# Patient Record
Sex: Female | Born: 1945
Health system: Southern US, Community
[De-identification: ages and names within clinical notes are randomized; demographics above are authoritative.]

## PROBLEM LIST (undated history)

## (undated) DIAGNOSIS — E785 Hyperlipidemia, unspecified: Secondary | ICD-10-CM

## (undated) DIAGNOSIS — C549 Malignant neoplasm of corpus uteri, unspecified: Secondary | ICD-10-CM

## (undated) DIAGNOSIS — F329 Major depressive disorder, single episode, unspecified: Secondary | ICD-10-CM

## (undated) DIAGNOSIS — I10 Essential (primary) hypertension: Secondary | ICD-10-CM

## (undated) DIAGNOSIS — K5792 Diverticulitis of intestine, part unspecified, without perforation or abscess without bleeding: Secondary | ICD-10-CM

## (undated) DIAGNOSIS — D6481 Anemia due to antineoplastic chemotherapy: Secondary | ICD-10-CM

## (undated) DIAGNOSIS — C541 Malignant neoplasm of endometrium: Secondary | ICD-10-CM

## (undated) DIAGNOSIS — C801 Malignant (primary) neoplasm, unspecified: Secondary | ICD-10-CM

## (undated) DIAGNOSIS — Z973 Presence of spectacles and contact lenses: Secondary | ICD-10-CM

## (undated) DIAGNOSIS — T451X5A Adverse effect of antineoplastic and immunosuppressive drugs, initial encounter: Secondary | ICD-10-CM

## (undated) DIAGNOSIS — K760 Fatty (change of) liver, not elsewhere classified: Secondary | ICD-10-CM

## (undated) HISTORY — DX: Essential (primary) hypertension: I10

## (undated) HISTORY — DX: Diverticulitis of intestine, part unspecified, without perforation or abscess without bleeding: K57.92

## (undated) HISTORY — DX: Adverse effect of antineoplastic and immunosuppressive drugs, initial encounter: T45.1X5A

## (undated) HISTORY — DX: Malignant neoplasm of corpus uteri, unspecified: C54.9

## (undated) HISTORY — DX: Hyperlipidemia, unspecified: E78.5

## (undated) HISTORY — DX: Presence of spectacles and contact lenses: Z97.3

## (undated) HISTORY — DX: Malignant neoplasm of endometrium: C54.1

## (undated) HISTORY — DX: Anemia due to antineoplastic chemotherapy: D64.81

## (undated) HISTORY — DX: Malignant (primary) neoplasm, unspecified: C80.1

## (undated) HISTORY — DX: Fatty (change of) liver, not elsewhere classified: K76.0

## (undated) HISTORY — DX: Major depressive disorder, single episode, unspecified: F32.9

---

## 2007-04-13 ENCOUNTER — Other Ambulatory Visit: Admission: RE | Admit: 2007-04-13 | Discharge: 2007-04-13 | Payer: Self-pay | Admitting: Family Medicine

## 2007-04-13 ENCOUNTER — Ambulatory Visit: Payer: Self-pay | Admitting: Family Medicine

## 2007-04-13 ENCOUNTER — Encounter: Payer: Self-pay | Admitting: Family Medicine

## 2007-04-13 ENCOUNTER — Encounter: Admission: RE | Admit: 2007-04-13 | Discharge: 2007-04-13 | Payer: Self-pay | Admitting: Family Medicine

## 2007-04-13 DIAGNOSIS — N951 Menopausal and female climacteric states: Secondary | ICD-10-CM | POA: Insufficient documentation

## 2007-04-14 ENCOUNTER — Encounter: Payer: Self-pay | Admitting: Family Medicine

## 2007-04-14 DIAGNOSIS — E785 Hyperlipidemia, unspecified: Secondary | ICD-10-CM | POA: Insufficient documentation

## 2007-04-14 LAB — CONVERTED CEMR LAB
ALT: 27 units/L (ref 0–35)
AST: 24 units/L (ref 0–37)
Albumin: 4.3 g/dL (ref 3.5–5.2)
Chloride: 103 meq/L (ref 96–112)
Cholesterol, target level: 200 mg/dL
Cholesterol: 208 mg/dL — ABNORMAL HIGH (ref 0–200)
Creatinine, Ser: 0.81 mg/dL (ref 0.40–1.20)
HDL: 53 mg/dL (ref >39)
LDL Goal: 160 mg/dL
Potassium: 4.9 meq/L (ref 3.5–5.3)
Sodium: 141 meq/L (ref 135–145)
Total Protein: 7.3 g/dL (ref 6.0–8.3)
Triglycerides: 157 mg/dL — ABNORMAL HIGH (ref <150)

## 2007-04-18 LAB — CONVERTED CEMR LAB: Pap Smear: NORMAL

## 2007-04-25 ENCOUNTER — Encounter: Payer: Self-pay | Admitting: Family Medicine

## 2007-06-08 ENCOUNTER — Ambulatory Visit: Payer: Self-pay | Admitting: Family Medicine

## 2007-07-24 ENCOUNTER — Ambulatory Visit: Payer: Self-pay | Admitting: Family Medicine

## 2007-07-24 DIAGNOSIS — I10 Essential (primary) hypertension: Secondary | ICD-10-CM | POA: Insufficient documentation

## 2007-11-17 ENCOUNTER — Ambulatory Visit: Payer: Self-pay | Admitting: Family Medicine

## 2007-11-17 LAB — CONVERTED CEMR LAB
Albumin/Creatinine Ratio, Urine, POC: <30
Microalbumin U total vol: 10 mg/L

## 2007-12-04 ENCOUNTER — Encounter: Payer: Self-pay | Admitting: Family Medicine

## 2007-12-05 LAB — CONVERTED CEMR LAB
BUN: 15 mg/dL (ref 6–23)
Chloride: 104 meq/L (ref 96–112)
Cholesterol: 242 mg/dL — ABNORMAL HIGH (ref 0–200)
Creatinine, Ser: 0.95 mg/dL (ref 0.40–1.20)
Glucose, Bld: 96 mg/dL (ref 70–99)
HDL: 48 mg/dL (ref >39)
LDL Goal: 130 mg/dL
Potassium: 4.2 meq/L (ref 3.5–5.3)
Triglycerides: 203 mg/dL — ABNORMAL HIGH (ref <150)
VLDL: 41 mg/dL — ABNORMAL HIGH (ref 0–40)

## 2007-12-12 ENCOUNTER — Ambulatory Visit: Payer: Self-pay | Admitting: Family Medicine

## 2007-12-12 DIAGNOSIS — M47816 Spondylosis without myelopathy or radiculopathy, lumbar region: Secondary | ICD-10-CM

## 2007-12-12 LAB — CONVERTED CEMR LAB
Blood in Urine, dipstick: NEGATIVE
Nitrite: NEGATIVE
Urobilinogen, UA: 0.2
pH: 5.5

## 2008-01-15 ENCOUNTER — Ambulatory Visit: Payer: Self-pay | Admitting: Family Medicine

## 2008-01-15 LAB — CONVERTED CEMR LAB
Glucose, Urine, Semiquant: NEGATIVE
Protein, U semiquant: NEGATIVE
Specific Gravity, Urine: 1.015
Urobilinogen, UA: 0.2

## 2008-02-23 DIAGNOSIS — K5792 Diverticulitis of intestine, part unspecified, without perforation or abscess without bleeding: Secondary | ICD-10-CM

## 2008-02-23 HISTORY — DX: Diverticulitis of intestine, part unspecified, without perforation or abscess without bleeding: K57.92

## 2008-06-03 ENCOUNTER — Encounter: Admission: RE | Admit: 2008-06-03 | Discharge: 2008-06-03 | Payer: Self-pay | Admitting: Family Medicine

## 2008-07-03 ENCOUNTER — Ambulatory Visit: Payer: Self-pay | Admitting: Family Medicine

## 2008-07-03 ENCOUNTER — Other Ambulatory Visit: Admission: RE | Admit: 2008-07-03 | Discharge: 2008-07-03 | Payer: Self-pay | Admitting: Family Medicine

## 2008-07-03 ENCOUNTER — Encounter: Payer: Self-pay | Admitting: Family Medicine

## 2008-07-05 LAB — CONVERTED CEMR LAB
AST: 18 units/L (ref 0–37)
Albumin: 4.3 g/dL (ref 3.5–5.2)
Alkaline Phosphatase: 56 units/L (ref 39–117)
BUN: 16 mg/dL (ref 6–23)
Chloride: 102 meq/L (ref 96–112)
Pap Smear: NORMAL
Potassium: 4.4 meq/L (ref 3.5–5.3)
Total Bilirubin: 0.6 mg/dL (ref 0.3–1.2)
Total Protein: 7.2 g/dL (ref 6.0–8.3)

## 2008-11-11 ENCOUNTER — Ambulatory Visit: Payer: Self-pay | Admitting: Family Medicine

## 2008-11-11 LAB — CONVERTED CEMR LAB
Glucose, Urine, Semiquant: NEGATIVE
Nitrite: NEGATIVE
Protein, U semiquant: NEGATIVE
Urobilinogen, UA: 0.2
pH: 7

## 2008-11-12 ENCOUNTER — Encounter: Payer: Self-pay | Admitting: Family Medicine

## 2008-11-13 LAB — CONVERTED CEMR LAB
ALT: 18 units/L (ref 0–35)
Albumin: 4.2 g/dL (ref 3.5–5.2)
Alkaline Phosphatase: 59 units/L (ref 39–117)
Basophils Relative: 0 % (ref 0–1)
CO2: 24 meq/L (ref 19–32)
Chloride: 99 meq/L (ref 96–112)
Lymphocytes Relative: 17 % (ref 12–46)
MCHC: 30.5 g/dL (ref 30.0–36.0)
Monocytes Absolute: 1.8 10*3/uL — ABNORMAL HIGH (ref 0.1–1.0)
Monocytes Relative: 9 % (ref 3–12)
Neutro Abs: 14 10*3/uL — ABNORMAL HIGH (ref 1.7–7.7)
Potassium: 4.5 meq/L (ref 3.5–5.3)
WBC: 19 10*3/uL — ABNORMAL HIGH (ref 4.0–10.5)

## 2008-11-14 ENCOUNTER — Encounter: Admission: RE | Admit: 2008-11-14 | Discharge: 2008-11-14 | Payer: Self-pay | Admitting: Family Medicine

## 2008-11-14 ENCOUNTER — Encounter: Payer: Self-pay | Admitting: Family Medicine

## 2008-11-25 ENCOUNTER — Ambulatory Visit: Payer: Self-pay | Admitting: Family Medicine

## 2008-11-25 DIAGNOSIS — K5732 Diverticulitis of large intestine without perforation or abscess without bleeding: Secondary | ICD-10-CM

## 2008-11-26 LAB — CONVERTED CEMR LAB
Basophils Relative: 0 % (ref 0–1)
Eosinophils Relative: 2 % (ref 0–5)
HCT: 40.9 % (ref 36.0–46.0)
Lymphs Abs: 4.5 10*3/uL — ABNORMAL HIGH (ref 0.7–4.0)
MCV: 89.5 fL (ref 78.0–100.0)
Monocytes Relative: 7 % (ref 3–12)
Platelets: 310 10*3/uL (ref 150–400)
RDW: 13.2 % (ref 11.5–15.5)
WBC: 9.7 10*3/uL (ref 4.0–10.5)

## 2009-03-19 ENCOUNTER — Ambulatory Visit: Payer: Self-pay | Admitting: Family Medicine

## 2009-04-15 ENCOUNTER — Ambulatory Visit: Payer: Self-pay | Admitting: Family Medicine

## 2009-06-02 ENCOUNTER — Encounter: Admission: RE | Admit: 2009-06-02 | Discharge: 2009-06-02 | Payer: Self-pay | Admitting: Family Medicine

## 2009-06-02 ENCOUNTER — Ambulatory Visit: Payer: Self-pay | Admitting: Family Medicine

## 2009-06-03 ENCOUNTER — Encounter: Admission: RE | Admit: 2009-06-03 | Discharge: 2009-06-03 | Payer: Self-pay | Admitting: Family Medicine

## 2009-06-04 DIAGNOSIS — M899 Disorder of bone, unspecified: Secondary | ICD-10-CM | POA: Insufficient documentation

## 2009-06-04 DIAGNOSIS — M949 Disorder of cartilage, unspecified: Secondary | ICD-10-CM

## 2009-06-04 LAB — CONVERTED CEMR LAB
ALT: 23 units/L (ref 0–35)
AST: 21 units/L (ref 0–37)
Albumin: 4.3 g/dL (ref 3.5–5.2)
Alkaline Phosphatase: 54 units/L (ref 39–117)
CO2: 28 meq/L (ref 19–32)
Chloride: 102 meq/L (ref 96–112)
Cholesterol: 225 mg/dL — ABNORMAL HIGH (ref 0–200)
HDL: 52 mg/dL (ref >39)
LDL Cholesterol: 134 mg/dL — ABNORMAL HIGH (ref 0–99)
Potassium: 4.4 meq/L (ref 3.5–5.3)
Sodium: 139 meq/L (ref 135–145)

## 2009-06-05 ENCOUNTER — Encounter: Payer: Self-pay | Admitting: Family Medicine

## 2009-06-05 LAB — CONVERTED CEMR LAB: Vit D, 25-Hydroxy: 35 ng/mL (ref 30–89)

## 2009-06-16 ENCOUNTER — Telehealth: Payer: Self-pay | Admitting: Family Medicine

## 2009-07-02 ENCOUNTER — Encounter: Payer: Self-pay | Admitting: Family Medicine

## 2009-07-09 ENCOUNTER — Encounter: Payer: Self-pay | Admitting: Family Medicine

## 2009-12-08 ENCOUNTER — Ambulatory Visit: Payer: Self-pay | Admitting: Family Medicine

## 2009-12-08 DIAGNOSIS — J069 Acute upper respiratory infection, unspecified: Secondary | ICD-10-CM | POA: Insufficient documentation

## 2010-01-19 ENCOUNTER — Encounter: Payer: Self-pay | Admitting: Family Medicine

## 2010-01-22 ENCOUNTER — Telehealth (INDEPENDENT_AMBULATORY_CARE_PROVIDER_SITE_OTHER): Payer: Self-pay | Admitting: *Deleted

## 2010-01-26 ENCOUNTER — Telehealth: Payer: Self-pay | Admitting: Family Medicine

## 2010-01-26 DIAGNOSIS — H0520 Unspecified exophthalmos: Secondary | ICD-10-CM | POA: Insufficient documentation

## 2010-02-05 ENCOUNTER — Encounter: Payer: Self-pay | Admitting: Family Medicine

## 2010-02-18 ENCOUNTER — Encounter: Payer: Self-pay | Admitting: Family Medicine

## 2010-03-24 NOTE — Progress Notes (Signed)
Summary: thyroid,dx code     New Problems: PROPTOSIS (ICD-376.30)   New Problems: PROPTOSIS (ICD-376.30) 2:02 PM January 26, 2010 McCrimmon CMA, Duncan Dull), Sue Lush  added a TSH for pt per eye doc. I need a dx code.  Try proptosis (376.30). December  5, 20112:22 PM [place] MD, Santina Evans  2:45 PM January 26, 2010 McCrimmon CMA, Duncan Dull), Sue Lush faxed order to lab

## 2010-03-24 NOTE — Assessment & Plan Note (Signed)
Summary: HTN, cough on ACEi   Vital Signs:  Patient profile:   65 year old female Height:      67 inches Weight:      209 pounds Pulse rate:   68 / minute BP sitting:   143 / 74  (left arm) Cuff size:   large  Vitals Entered By: Kathlene November (March 19, 2009 3:18 PM) CC: followup BP- pt states was feeling really tired and stopped her BP med for 2 days and feeling better but now BP elevated   Primary Care Provider:  Linford Arnold, C  CC:  followup BP- pt states was feeling really tired and stopped her BP med for 2 days and feeling better but now BP elevated.  History of Present Illness: followup BP- pt states was feeling really tired and stopped her BP med for 2 days and feeling better but now BP elevated. BP was 121/60s.  Felt very fatigued for several months.  Had a dry cough as well. Stoped med for a couple of days and did feel better. Took her old HCTZ in the meantime. Says has been walking more and eating better. Has cut out some of the fast food.   Current Medications (verified): 1)  Calcium 500/d 500-200 Mg-Unit  Tabs (Calcium Carbonate-Vitamin D) .... Take 1 Tablet By Mouth Once A Day 2)  Lisinopril-Hydrochlorothiazide 20-25 Mg Tabs (Lisinopril-Hydrochlorothiazide) .... Take 1 Tablet By Mouth Once A Day 3)  Welchol 625 Mg Tabs (Colesevelam Hcl) .... 3 Tabs By Mouth Two Times A Day  Allergies (verified): 1)  Zocor 2)  Pravastatin Sodium (Pravastatin Sodium) 3)  Lipitor (Atorvastatin Calcium) 4)  Lisinopril (Lisinopril)  Comments:  Nurse/Medical Assistant: The patient's medications and allergies were reviewed with the patient and were updated in the Medication and Allergy Lists. Kathlene November (March 19, 2009 3:19 PM)  Physical Exam  General:  Well-developed,well-nourished,in no acute distress; alert,appropriate and cooperative throughout examination Head:  Normocephalic and atraumatic without obvious abnormalities. No apparent alopecia or balding. Lungs:  Normal  respiratory effort, chest expands symmetrically. Lungs are clear to auscultation, no crackles or wheezes. Heart:  Normal rate and regular rhythm. S1 and S2 normal without gallop, murmur, click, rub or other extra sounds. Skin:  no rashes.   Psych:  Cognition and judgment appear intact. Alert and cooperative with normal attention span and concentration. No apparent delusions, illusions, hallucinations   Impression & Recommendations:  Problem # 1:  HYPERTENSION, BENIGN (ICD-401.1) Stop ACEi secondary to cough.  Change to ARB with diuretic. CHeck BP at home and make sure well controlled.  Fu in April or May for her CPE. Will give her her shingles vaccines today. Due for labs with her CPE.  Her updated medication list for this problem includes:    Losartan Potassium-hctz 50-12.5 Mg Tabs (Losartan potassium-hctz) .Marland Kitchen... Take 1 tablet by mouth once a day in the am.  Complete Medication List: 1)  Calcium 500/d 500-200 Mg-unit Tabs (Calcium carbonate-vitamin d) .... Take 1 tablet by mouth once a day 2)  Losartan Potassium-hctz 50-12.5 Mg Tabs (Losartan potassium-hctz) .... Take 1 tablet by mouth once a day in the am. 3)  Welchol 625 Mg Tabs (Colesevelam hcl) .... 3 tabs by mouth two times a day  Other Orders: Zoster (Shingles) Vaccine Live [phone]) Admin 1st Vaccine (69629)  Patient Instructions: 1)  Please schedule a follow-up appointment in 3 months .  Prescriptions: LOSARTAN POTASSIUM-HCTZ 50-12.5 MG TABS (LOSARTAN POTASSIUM-HCTZ) Take 1 tablet by mouth once a day in the AM.  #  30 x 3   Entered and Authorized by:   Nani Gasser MD   Signed by:   Nani Gasser MD on 03/19/2009   Method used:   Electronically to        [facility]* (retail)       [address] [place], [place]  [address]       Ph: [phone]       Fax: [phone]   RxID:   [id number]   Last Flu Vaccine:  Historical (11/30/2007 3:50:21 PM) Flu Vaccine Result Date:  11/22/2008 Flu Vaccine  Result:  given Flu Vaccine Next Due:  1 yr   Immunizations Administered:  Zostavax # 1:    Vaccine Type: Zostavax    Site: left deltoid    Mfr: Merck    Dose: 0.44ml    Route: IM    Given by: Kathlene November    Exp. Date: 03/21/2010    Lot #: [id number]    VIS given: 12/04/04 given March 19, 2009.

## 2010-03-24 NOTE — Letter (Signed)
Summary: Letter to Patient with Path Results/[facility]  Letter to Patient with Path Results/[facility]   Imported By: Lanelle Bal 08/21/2009 09:05:41  _____________________________________________________________________  External Attachment:    Type:   Image     Comment:   External Document

## 2010-03-24 NOTE — Assessment & Plan Note (Signed)
Summary: URI   Vital Signs:  Patient profile:   65 year old female Height:      67 inches Weight:      210 pounds BMI:     33.01 Temp:     98.6 degrees F oral Pulse rate:   68 / minute BP sitting:   118 / 58  (left arm) Cuff size:   large  Vitals Entered By: Kathlene November (April 15, 2009 2:58 PM) CC: last wednesday- cough, head congestion, sore throat- but feels some better today   Primary Care Provider:  Linford Arnold, C  CC:  last wednesday- cough, head congestion, and sore throat- but feels some better today.  History of Present Illness: last wednesday- cough, head congestion, sore throat- but feels some better today. No muscle aches.  Low grade fever the first day.  Then started with cough, produtive cough.  AFter 2 days had a head congestion and some ear pain. That got better. Yesterday had slight ST but better today. Taking some histablock for the sinuses and AOJ for respiratory sxs.    No GI sxs.  No worsening sxs.   Current Medications (verified): 1)  Calcium 500/d 500-200 Mg-Unit  Tabs (Calcium Carbonate-Vitamin D) .... Take 1 Tablet By Mouth Once A Day 2)  Losartan Potassium-Hctz 50-12.5 Mg Tabs (Losartan Potassium-Hctz) .... Take 1 Tablet By Mouth Once A Day in The Am. 3)  Welchol 625 Mg Tabs (Colesevelam Hcl) .... 3 Tabs By Mouth Two Times A Day  Allergies (verified): 1)  Zocor 2)  Pravastatin Sodium (Pravastatin Sodium) 3)  Lipitor (Atorvastatin Calcium) 4)  Lisinopril (Lisinopril)  Comments:  Nurse/Medical Assistant: The patient's medications and allergies were reviewed with the patient and were updated in the Medication and Allergy Lists. Kathlene November (April 15, 2009 2:59 PM)  Physical Exam  General:  Well-developed,well-nourished,in no acute distress; alert,appropriate and cooperative throughout examination Head:  Normocephalic and atraumatic without obvious abnormalities. No apparent alopecia or balding. Eyes:  No corneal or conjunctival inflammation  noted. EOMI. Perrla.  Ears:  Right canal is blocked by cerume.  Left canal with a fair amount of wax and manual disimpaction performed.  Nose:  External nasal examination shows no deformity or inflammation. Nasal mucosa are pink and moist without lesions or exudates. Mouth:  Oral mucosa and oropharynx without lesions or exudates.  Teeth in good repair. Neck:  No deformities, masses, or tenderness noted. Lungs:  Normal respiratory effort, chest expands symmetrically. Lungs are clear to auscultation, no crackles or wheezes. Heart:  Normal rate and regular rhythm. S1 and S2 normal without gallop, murmur, click, rub or other extra sounds. Skin:  no rashes.   Cervical Nodes:  No lymphadenopathy noted Psych:  Cognition and judgment appear intact. Alert and cooperative with normal attention span and concentration. No apparent delusions, illusions, hallucinations   Impression & Recommendations:  Problem # 1:  URI (ICD-465.9)  Instructed on symptomatic treatment. Call if symptoms persist or worsen. She is on the mend. Call if suddenlty gets worse.   Problem # 2:  CERUMEN IMPACTION (ICD-380.4)  Right ear irrigated.  manually disimpacted the left ear.   Orders: Cerumen Impaction Removal ([id number])  Complete Medication List: 1)  Calcium 500/d 500-200 Mg-unit Tabs (Calcium carbonate-vitamin d) .... Take 1 tablet by mouth once a day 2)  Losartan Potassium-hctz 50-12.5 Mg Tabs (Losartan potassium-hctz) .... Take 1 tablet by mouth once a day in the am. 3)  Welchol 625 Mg Tabs (Colesevelam hcl) .... 3 tabs by mouth two  times a day  Appended Document: URI Manually disimpacted ear with a curette.

## 2010-03-24 NOTE — Progress Notes (Signed)
----   Converted from flag ---- ---- 01/20/2010 8:14 AM, Nani Gasser MD wrote: Call pt: Her eye MD asked that we check her thyroid because of asymmetry of her eyes. If OK we can print order adn she can go anytime. ------------------------------  01/22/10 1:51 called and left message on pt vm andrea mccrimmon cma

## 2010-03-24 NOTE — Procedures (Signed)
Summary: Colonoscopy    Last Colonoscopy:  Unknown (02/23/2003 3:51:21 PM) Colonoscopy Result Date:  07/02/2009 Colonoscopy Result:  normal

## 2010-03-24 NOTE — Procedures (Signed)
Summary: Colonoscopy/[facility]  Colonoscopy/[facility]   Imported By: Lanelle Bal 07/10/2009 09:15:41  _____________________________________________________________________  External Attachment:    Type:   Image     Comment:   External Document

## 2010-03-24 NOTE — Assessment & Plan Note (Signed)
Summary: URI   Vital Signs:  Patient profile:   65 year old female Height:      67 inches Weight:      218 pounds BP sitting:   128 / 72  (left arm) Cuff size:   large  Vitals Entered By: Avon Gully CMA, Duncan Dull) (December 08, 2009 2:51 PM) CC: scrathy throat since sat,cough,   Primary Care Arita Severtson:  Linford Arnold, C  CC:  scrathy throat since sat, cough, and .  History of Present Illness: scrathy throat since sat. then today started with cough.  Not sure if any fever.  No SOB.  No cough meds.  No other nasal sxs or allergies.   Current Medications (verified): 1)  Calcium 500/d 500-200 Mg-Unit  Tabs (Calcium Carbonate-Vitamin D) .... Take 1 Tablet By Mouth Two Times A Day 2)  Losartan Potassium-Hctz 50-12.5 Mg Tabs (Losartan Potassium-Hctz) .... Take 1 Tablet By Mouth Once A Day in The Am. 3)  Welchol 3.75 Gm Pack (Colesevelam Hcl) .Marland Kitchen.. 1 Pack Mixed With Glass of Water Once A Day For Cholesterol.  Allergies (verified): 1)  Zocor 2)  Pravastatin Sodium (Pravastatin Sodium) 3)  Lipitor (Atorvastatin Calcium) 4)  Lisinopril (Lisinopril)  Comments:  Nurse/Medical Assistant: The patient's medications and allergies were reviewed with the patient and were updated in the Medication and Allergy Lists. Avon Gully CMA, Duncan Dull) (December 08, 2009 2:52 PM)  Physical Exam  General:  Well-developed,well-nourished,in no acute distress; alert,appropriate and cooperative throughout examination Head:  Normocephalic and atraumatic without obvious abnormalities. No apparent alopecia or balding. Eyes:  No corneal or conjunctival inflammation noted. EOMI. Perrla. Ears:  External ear exam shows no significant lesions or deformities.  Otoscopic examination reveals clear canals, tympanic membranes are intact bilaterally without bulging, retraction, inflammation or discharge. Hearing is grossly normal bilaterally. Nose:  External nasal examination shows no deformity or inflammation. Nasal mucosa  are pink and moist without lesions or exudates. Tubinates are midly swollen.  Mouth:  Oral mucosa and oropharynx without lesions or exudates.  Teeth in good repair. Neck:  No deformities, masses, or tenderness noted. Lungs:  Normal respiratory effort, chest expands symmetrically. Lungs are clear to auscultation, no crackles or wheezes. Heart:  Normal rate and regular rhythm. S1 and S2 normal without gallop, murmur, click, rub or other extra sounds. Skin:  no rashes.   Cervical Nodes:  No lymphadenopathy noted Psych:  Cognition and judgment appear intact. Alert and cooperative with normal attention span and concentration. No apparent delusions, illusions, hallucinations   Impression & Recommendations:  Problem # 1:  URI (ICD-465.9)  Instructed on symptomatic treatment. Call if symptoms persist or worsen.   Complete Medication List: 1)  Calcium 500/d 500-200 Mg-unit Tabs (Calcium carbonate-vitamin d) .... Take 1 tablet by mouth two times a day 2)  Losartan Potassium-hctz 50-12.5 Mg Tabs (Losartan potassium-hctz) .... Take 1 tablet by mouth once a day in the am. 3)  Welchol 3.75 Gm Pack (Colesevelam hcl) .Marland Kitchen.. 1 pack mixed with glass of water once a day for cholesterol.   Orders Added: 1)  Est. Patient Level III [54098]

## 2010-03-24 NOTE — Letter (Signed)
Summary: Eye Exam/[facility]  Eye Exam/[facility]   Imported By: Maryln Gottron 01/29/2010 15:16:31  _____________________________________________________________________  External Attachment:    Type:   Image     Comment:   External Document

## 2010-03-24 NOTE — Assessment & Plan Note (Signed)
Summary: CPE   Vital Signs:  Patient profile:   65 year old female Height:      67 inches Weight:      209 pounds Pulse rate:   67 / minute BP sitting:   111 / 58  (left arm) Cuff size:   large  Vitals Entered By: Kathlene November (June 02, 2009 8:04 AM) CC: CPE no pap   Primary Care Provider:  Linford Arnold, C  CC:  CPE no pap.  History of Present Illness: CPE no pap.  Getting resgular exercise right now.  Thinks may retire soon.   Current Medications (verified): 1)  Calcium 500/d 500-200 Mg-Unit  Tabs (Calcium Carbonate-Vitamin D) .... Take 1 Tablet By Mouth Once A Day 2)  Losartan Potassium-Hctz 50-12.5 Mg Tabs (Losartan Potassium-Hctz) .... Take 1 Tablet By Mouth Once A Day in The Am.  Allergies (verified): 1)  Zocor 2)  Pravastatin Sodium (Pravastatin Sodium) 3)  Lipitor (Atorvastatin Calcium) 4)  Lisinopril (Lisinopril)  Comments:  Nurse/Medical Assistant: The patient's medications and allergies were reviewed with the patient and were updated in the Medication and Allergy Lists. Kathlene November (June 02, 2009 8:05 AM)  Past History:  Past Surgical History: Last updated: 04/13/2007 [facility]  Family History: Last updated: 04/13/2007 Father with Lung Ca- smoker Mother with RA and severe Osteoporosis.   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  Married.   Never Smoked Alcohol use-no Drug use-no Regular exercise-yes  Review of Systems  The patient denies anorexia, fever, weight loss, weight gain, vision loss, decreased hearing, hoarseness, chest pain, syncope, dyspnea on exertion, peripheral edema, prolonged cough, headaches, hemoptysis, abdominal pain, melena, hematochezia, severe indigestion/heartburn, hematuria, incontinence, genital sores, muscle weakness, suspicious skin lesions, transient blindness, difficulty walking, depression, unusual weight change, abnormal bleeding, enlarged lymph nodes, and breast masses.     Physical Exam  General:  Well-developed,well-nourished,in no acute distress; alert,appropriate and cooperative throughout examination Head:  Normocephalic and atraumatic without obvious abnormalities. No apparent alopecia or balding. Eyes:  No corneal or conjunctival inflammation noted. EOMI. Perrla.  Ears:  External ear exam shows no significant lesions or deformities.  Otoscopic examination reveals clear canals, tympanic membranes are intact bilaterally without bulging, retraction, inflammation or discharge. Hearing is grossly normal bilaterally. Nose:  External nasal examination shows no deformity or inflammation. Nasal mucosa are pink and moist without lesions or exudates. Mouth:  Oral mucosa and oropharynx without lesions or exudates.  Teeth in good repair. Neck:  No deformities, masses, or tenderness noted. Chest Wall:  No deformities, masses, or tenderness noted. Breasts:  No mass, nodules, thickening, tenderness, bulging, retraction, inflamation, nipple discharge or skin changes noted.   Lungs:  Normal respiratory effort, chest expands symmetrically. Lungs are clear to auscultation, no crackles or wheezes. Heart:  Normal rate and regular rhythm. S1 and S2 normal without gallop, murmur, click, rub or other extra sounds. Abdomen:  Bowel sounds positive,abdomen soft and non-tender without masses, organomegaly or hernias noted. Msk:  No deformity or scoliosis noted of thoracic or lumbar spine.   Pulses:  R and L carotid,radial,dorsalis pedis and posterior tibial pulses are full and equal bilaterally Extremities:  No clubbing, cyanosis, edema, or deformity noted with normal full range of motion of all joints.   Neurologic:  No cranial nerve deficits noted. Station and gait are normal. DTRs are symmetrical throughout. Sensory, motor and coordinative functions appear intact. Skin:  no rashes.   Cervical Nodes:  No lymphadenopathy noted Axillary Nodes:  No  palpable lymphadenopathy Psych:   Cognition and judgment appear intact. Alert and cooperative with normal attention span and concentration. No apparent delusions, illusions, hallucinations   Impression & Recommendations:  Problem # 1:  HEALTH MAINTENANCE EXAM (ICD-V70.0)  Dr. Dorothyann Gibbs refer colonoscopy. Has been 6 years but has an episode of diverticulosis.  Has mammo scheduled for today. Discussed need for bone denisty as has been 2 years.   Overall doing well.Due for screening labs. Immunizations are up to day. Make sure taking calcium regularly esp with her family hx.  Orders: Gastroenterology Referral (GI)  Complete Medication List: 1)  Calcium 500/d 500-200 Mg-unit Tabs (Calcium carbonate-vitamin d) .... Take 1 tablet by mouth once a day 2)  Losartan Potassium-hctz 50-12.5 Mg Tabs (Losartan potassium-hctz) .... Take 1 tablet by mouth once a day in the am.  Other Orders: T-Dual DXA Bone Density/ Axial ([address]) T-Comprehensive Metabolic Panel ([id number]) T-Lipid Profile ([id number])

## 2010-03-24 NOTE — Progress Notes (Signed)
  Phone Note Call from Patient   Caller: Patient Summary of Call: pt. had her physical this month and has not heard anything back about her lab work and would like for you to mail her a copy of labs. Thank you Initial call taken by: Michaelle Copas,  June 16, 2009 9:19 AM  Follow-up for Phone Call        Mailed copy of labs- went over these labs when pt walked in office Follow-up by: Kathlene November,  June 16, 2009 9:28 AM

## 2010-03-26 NOTE — Letter (Signed)
Summary: [facility]   Imported By: Lanelle Bal 03/04/2010 09:16:34  _____________________________________________________________________  External Attachment:    Type:   Image     Comment:   External Document

## 2010-03-26 NOTE — Letter (Signed)
Summary: [facility]   Imported By: Lanelle Bal 02/26/2010 12:06:24  _____________________________________________________________________  External Attachment:    Type:   Image     Comment:   External Document

## 2010-05-14 ENCOUNTER — Other Ambulatory Visit: Payer: Self-pay | Admitting: Family Medicine

## 2010-05-14 DIAGNOSIS — Z1231 Encounter for screening mammogram for malignant neoplasm of breast: Secondary | ICD-10-CM

## 2010-06-30 ENCOUNTER — Ambulatory Visit
Admission: RE | Admit: 2010-06-30 | Discharge: 2010-06-30 | Disposition: A | Discharge status: Home / Self Care | Payer: BC Managed Care – PPO | Source: Ambulatory Visit | Attending: Family Medicine | Admitting: Family Medicine

## 2010-06-30 ENCOUNTER — Ambulatory Visit (INDEPENDENT_AMBULATORY_CARE_PROVIDER_SITE_OTHER): Payer: BC Managed Care – PPO | Admitting: Family Medicine

## 2010-06-30 ENCOUNTER — Other Ambulatory Visit: Payer: Self-pay | Admitting: Family Medicine

## 2010-06-30 ENCOUNTER — Other Ambulatory Visit (HOSPITAL_COMMUNITY)
Admission: RE | Admit: 2010-06-30 | Discharge: 2010-06-30 | Disposition: A | Discharge status: Home / Self Care | Payer: BC Managed Care – PPO | Source: Ambulatory Visit | Attending: Family Medicine | Admitting: Family Medicine

## 2010-06-30 ENCOUNTER — Encounter: Payer: Self-pay | Admitting: Family Medicine

## 2010-06-30 VITALS — BP 135/79 | HR 80 | Ht 66.5 in | Wt 219.0 lb

## 2010-06-30 DIAGNOSIS — Z01419 Encounter for gynecological examination (general) (routine) without abnormal findings: Secondary | ICD-10-CM | POA: Insufficient documentation

## 2010-06-30 DIAGNOSIS — Z0000 Encounter for general adult medical examination without abnormal findings: Secondary | ICD-10-CM

## 2010-06-30 DIAGNOSIS — Z1231 Encounter for screening mammogram for malignant neoplasm of breast: Secondary | ICD-10-CM

## 2010-06-30 DIAGNOSIS — Z1159 Encounter for screening for other viral diseases: Secondary | ICD-10-CM | POA: Insufficient documentation

## 2010-06-30 LAB — LIPID PANEL
Cholesterol: 193 mg/dL (ref 0–200)
HDL: 46 mg/dL (ref >39)
Triglycerides: 149 mg/dL (ref <150)
VLDL: 30 mg/dL (ref 0–40)

## 2010-06-30 NOTE — Progress Notes (Signed)
Subjective:    Patient ID: Erika Strong, female    DOB: 1945-08-16, 65 y.o.   MRN: [id number]  HPI Off her Welchol for the last 6 months.  Stopped secondary to consatipation.     Review of Systems     Objective:   Physical Exam        Assessment & Plan:   Subjective:     Erika Strong is a 65 y.o. female and is here for a comprehensive physical exam. The patient reports problems - spot on her nose she wants me to look at. It was red but now looks like a scab. She thinks it might be healing.  Marland Kitchen  History   Social History  . Marital Status: Married    Spouse Name: N/A    Number of Children: N/A  . Years of Education: Assoc degr   Occupational History  . cosmotologist/teacher    Social History Main Topics  . Smoking status: Never Smoker   . Smokeless tobacco: Not on file  . Alcohol Use: No  . Drug Use: No  . Sexually Active: Not on file   Other Topics Concern  . Not on file   Social History Narrative  . No narrative on file   Health Maintenance  Topic Date Due  . Pap Smear  04/22/1965  . Influenza Vaccine  11/23/2010  . Mammogram  06/03/2011  . Tetanus/tdap  04/12/2017  . Colonoscopy  07/03/2019  . Zostavax  Completed    The following portions of the patient's history were reviewed and updated as appropriate: allergies, current medications, past family history, past medical history, past social history, past surgical history and problem list.  Review of Systems A comprehensive review of systems was negative.   Objective:    BP 135/79  Pulse 80  Ht 5' 6.5" (1.689 m)  Wt 219 lb (99.338 kg)  BMI 34.82 kg/m2 General appearance: alert, cooperative, appears stated age and mildly obese Head: Normocephalic, without obvious abnormality, atraumatic Eyes: conjunctivae/corneas clear. PERRL, EOM's intact. Fundi benign., PEERLA, EOMi, conjunctiva clear.  Ears: normal TM's and external ear canals both ears Nose: Nares normal. Septum midline. Mucosa normal. No  drainage or sinus tenderness. Throat: lips, mucosa, and tongue normal; teeth and gums normal Neck: no adenopathy, no carotid bruit, supple, symmetrical, trachea midline and thyroid not enlarged, symmetric, no tenderness/mass/nodules Back: symmetric, no curvature. ROM normal. No CVA tenderness. Lungs: clear to auscultation bilaterally Breasts: normal appearance, no masses or tenderness Heart: regular rate and rhythm, S1, S2 normal, no murmur, click, rub or gallop Abdomen: soft, non-tender; bowel sounds normal; no masses,  no organomegaly Pelvic: cervix normal in appearance, external genitalia normal, no adnexal masses or tenderness, no cervical motion tenderness, uterus normal size, shape, and consistency and vagina normal without discharge. She does have some bladder prolapse.  Extremities: extremities normal, atraumatic, no cyanosis or edema Pulses: 2+ and symmetric Skin: Skin color, texture, turgor normal. No rashes or lesionsShe does have a tiny scab on her nose.  Lymph nodes: Cervical, supraclavicular, and axillary nodes normal. Neurologic: Grossly normal    Assessment:    Healthy female exam.      Plan:     See After Visit Summary for Counseling Recommendations  Her vaccinations are up-to-date. She has a mammogram scheduled for later today. We will followup her Pap smear results as soon as they're back. She is due for screening lab works I gave her a lab slip today. The first lesion on her nose is  concerned I do recommend that she get a couple more weeks to see if it heals if not we can consider cryotherapy or possible referral to dermatology. She did have some bladder prolapse on exam today but she feels that she is not currently symptomatic so did not make any further recommendations for this. I did encourage regular exercise and healthy diet she would benefit from some weight loss.

## 2010-07-01 ENCOUNTER — Telehealth: Payer: Self-pay | Admitting: Family Medicine

## 2010-07-01 LAB — COMPLETE METABOLIC PANEL WITH GFR
AST: 26 U/L (ref 0–37)
Alkaline Phosphatase: 63 U/L (ref 39–117)
BUN: 16 mg/dL (ref 6–23)
CO2: 26 mEq/L (ref 19–32)
Calcium: 9.4 mg/dL (ref 8.4–10.5)
GFR, Est African American: >60 mL/min (ref >60)
Glucose, Bld: 88 mg/dL (ref 70–99)
Sodium: 140 mEq/L (ref 135–145)

## 2010-07-01 NOTE — Telephone Encounter (Signed)
Pt wants copies of her labs mailed to her. Plan:  Mailed lab results to pt at her request. Jarvis Newcomer, LPN Domingo Dimes

## 2010-07-01 NOTE — Telephone Encounter (Signed)
Call pt: Cholesterol looks much better!!!! I know she has been off the welchol for 6 months but she is definitely dong something right so keep up the good work. Make sure getting regular exercise too and then lets recheck in 6 months off of medication.

## 2010-07-01 NOTE — Telephone Encounter (Signed)
Left message on pt's vm with results

## 2010-07-02 ENCOUNTER — Telehealth: Payer: Self-pay | Admitting: Family Medicine

## 2010-07-02 NOTE — Telephone Encounter (Signed)
Please call patient. Normal mammogram.  Repeat in 1 year.  

## 2010-07-03 NOTE — Telephone Encounter (Signed)
Left message on vm with results  

## 2010-07-14 ENCOUNTER — Telehealth: Payer: Self-pay | Admitting: Family Medicine

## 2010-07-14 NOTE — Telephone Encounter (Signed)
Pt.notified

## 2010-07-14 NOTE — Telephone Encounter (Signed)
Call patient: Repeat Pap smear in 3 years

## 2010-07-24 ENCOUNTER — Other Ambulatory Visit: Payer: Self-pay | Admitting: Family Medicine

## 2010-07-27 ENCOUNTER — Encounter: Payer: Self-pay | Admitting: Family Medicine

## 2010-07-27 ENCOUNTER — Ambulatory Visit (INDEPENDENT_AMBULATORY_CARE_PROVIDER_SITE_OTHER): Payer: BC Managed Care – PPO | Admitting: Family Medicine

## 2010-07-27 DIAGNOSIS — I878 Other specified disorders of veins: Secondary | ICD-10-CM | POA: Insufficient documentation

## 2010-07-27 DIAGNOSIS — I10 Essential (primary) hypertension: Secondary | ICD-10-CM

## 2010-07-27 DIAGNOSIS — I872 Venous insufficiency (chronic) (peripheral): Secondary | ICD-10-CM

## 2010-07-27 MED ORDER — CHLORTHALIDONE 50 MG PO TABS: 50.0000 mg | ORAL_TABLET | Freq: Every day | ORAL | Status: DC

## 2010-07-27 NOTE — Assessment & Plan Note (Signed)
The hctz could have caused the leg cramping. Will start with chlorthalidone and then f/u in 2 weeks. Will likely need to add the losartan back to her regimen for complete control. Also dicussed could consider compression stocking for her left foot as well.

## 2010-07-27 NOTE — Progress Notes (Signed)
  Subjective:    Patient ID: Erika Strong, female    DOB: 11/12/1945, 65 y.o.   MRN: [id number]  HPI She felt like she was having leg heaviness and cramping in her legs.  She ran out of meds over the weekend and she says she legs feel better. BPs running 140s at home. She does retain a lot of fluids so feels she neesd some type of diuretic. No CP, SOB, or HA.  Says otherwise feels OK.  Normally is very compliant with her meds.    Review of Systems     Objective:   Physical Exam  Constitutional: She appears well-developed and well-nourished.  HENT:  Head: Normocephalic and atraumatic.  Neck: Neck supple. No thyromegaly present.  Cardiovascular: Normal rate, regular rhythm and normal heart sounds.        No carotid bruits.   Pulmonary/Chest: Effort normal and breath sounds normal.  Musculoskeletal: She exhibits edema.       Trace ankle edema on the right. 1+ on the left to the knee.   Lymphadenopathy:    She has no cervical adenopathy.  Skin: Skin is warm and dry.  Psychiatric: She has a normal mood and affect.          Assessment & Plan:

## 2010-07-29 ENCOUNTER — Encounter: Payer: Self-pay | Admitting: Family Medicine

## 2010-08-09 ENCOUNTER — Encounter: Payer: Self-pay | Admitting: Family Medicine

## 2010-08-10 ENCOUNTER — Ambulatory Visit (INDEPENDENT_AMBULATORY_CARE_PROVIDER_SITE_OTHER): Payer: BC Managed Care – PPO | Admitting: Family Medicine

## 2010-08-10 ENCOUNTER — Encounter: Payer: Self-pay | Admitting: Family Medicine

## 2010-08-10 VITALS — BP 133/81 | HR 80 | Ht 67.0 in | Wt 219.0 lb

## 2010-08-10 DIAGNOSIS — I10 Essential (primary) hypertension: Secondary | ICD-10-CM

## 2010-08-10 MED ORDER — CHLORTHALIDONE 50 MG PO TABS: 50.0000 mg | ORAL_TABLET | Freq: Every day | ORAL | Status: DC

## 2010-08-10 NOTE — Assessment & Plan Note (Signed)
Dong well on the pill. No leg cramping.

## 2010-08-10 NOTE — Progress Notes (Signed)
  Subjective:    Patient ID: Erika Strong, female    DOB: 1945/12/11, 65 y.o.   MRN: [id number]  Hypertension This is a chronic problem. The current episode started more than 1 year ago. The problem has been gradually improving since onset. The problem is controlled. Associated symptoms include peripheral edema. Pertinent negatives include no chest pain or shortness of breath. (Swelling is better. ) There are no associated agents to hypertension. There are no compliance problems.       Review of Systems  Respiratory: Negative for shortness of breath.   Cardiovascular: Negative for chest pain.       Objective:   Physical Exam  Constitutional: She appears well-developed and well-nourished.  HENT:  Head: Normocephalic and atraumatic.  Cardiovascular: Normal rate, regular rhythm and normal heart sounds.   Pulmonary/Chest: Effort normal and breath sounds normal.  Musculoskeletal: She exhibits edema.       Trace edema  Skin: Skin is warm and dry.          Assessment & Plan:

## 2010-08-11 ENCOUNTER — Telehealth: Payer: Self-pay | Admitting: Family Medicine

## 2010-08-11 LAB — BASIC METABOLIC PANEL WITH GFR
CO2: 33 mEq/L — ABNORMAL HIGH (ref 19–32)
GFR, Est African American: >60 mL/min (ref >60)
GFR, Est Non African American: >60 mL/min (ref >60)
Potassium: 3.6 mEq/L (ref 3.5–5.3)

## 2010-08-11 NOTE — Telephone Encounter (Signed)
Call pt: BMP is OK.

## 2010-08-11 NOTE — Telephone Encounter (Signed)
LMOM that pt BW was normal.

## 2010-10-19 ENCOUNTER — Ambulatory Visit (INDEPENDENT_AMBULATORY_CARE_PROVIDER_SITE_OTHER): Payer: BC Managed Care – PPO | Admitting: Family Medicine

## 2010-10-19 ENCOUNTER — Encounter: Payer: Self-pay | Admitting: Family Medicine

## 2010-10-19 VITALS — BP 146/77 | HR 83 | Wt 217.0 lb

## 2010-10-19 DIAGNOSIS — N950 Postmenopausal bleeding: Secondary | ICD-10-CM

## 2010-10-19 NOTE — Progress Notes (Signed)
  Subjective:    Patient ID: Erika Strong, female    DOB: Jun 07, 1945, 65 y.o.   MRN: [id number]  HPI  Had a normal pap smear in May.  About end of June would notice a spot of blood occ. Was getting ready to go on vacation so didn't come in.  Then got a little more frequent.  Then last week noticed some blood in the toilet and a clot.  No pain or cramping.  Then started to get less.  No CP, SOB or dizziness. She says has had very little spotting since then. No prior hx of endometrial or cervical cancer. Seh is not on HRT.   Review of Systems     Objective:   Physical Exam  Genitourinary: Vagina normal. No vaginal discharge found.       Bright red blood at the os.  Cervix appers normal.  Uterus doesn't feel enlarged. Unable to palpate her ovaries.           Assessment & Plan:  Postmenopausal bleeding-she is 15 years postmenopausal. I explained to her that this means she needs an endometrial biopsy and root reviewed the procedure technique. I will refer her to GYN, Margo Aye for this. Her pelvic exam was normal except for some bright red blood at the os. I did not see any lesions or cervical polyps. If we are not able to get her in probably that we can go ahead and schedule her for a pelvic ultrasound to evaluate her uterus and ovaries.

## 2010-10-19 NOTE — Patient Instructions (Signed)
We will call you with the gyn referral.

## 2010-10-20 ENCOUNTER — Encounter: Payer: Self-pay | Admitting: Obstetrics & Gynecology

## 2010-10-20 ENCOUNTER — Other Ambulatory Visit: Payer: Self-pay | Admitting: Obstetrics & Gynecology

## 2010-10-20 ENCOUNTER — Ambulatory Visit (INDEPENDENT_AMBULATORY_CARE_PROVIDER_SITE_OTHER): Payer: BC Managed Care – PPO | Admitting: Obstetrics & Gynecology

## 2010-10-20 VITALS — BP 158/84 | HR 103 | Temp 98.2°F | Resp 17 | Ht 67.0 in | Wt 214.0 lb

## 2010-10-20 DIAGNOSIS — N950 Postmenopausal bleeding: Secondary | ICD-10-CM

## 2010-10-20 MED ORDER — MISOPROSTOL 200 MCG PO TABS: ORAL_TABLET | ORAL | Status: DC

## 2010-10-20 NOTE — Progress Notes (Signed)
  Subjective:    Patient ID: Erika Strong, female    DOB: [birth year], 65 y.o.   MRN: [id number]  HPI  Erika Strong is a 65 yo MW lady who began seeing spots of PMB in June 2012.  She has been menopausal since 1997.  She is not sexually active by choice  Review of Systems    pap 5/12 was reportedly normal Objective:   Physical Exam  Very atrophic vulva and vagina and cervix Cervix has a small amount of bloody mucous at os Bimanual exam reveals her uterus to be a 8 week size and non-tender. Her adnexa are non-enlarged and non-tender      Assessment & Plan:  PMB- I will get a gyn ultrasound ASAP and have her follow up for a EMBX. I will pretreat her with cytotec to facilitate the biopsy.

## 2010-10-21 ENCOUNTER — Ambulatory Visit (INDEPENDENT_AMBULATORY_CARE_PROVIDER_SITE_OTHER): Payer: BC Managed Care – PPO | Admitting: Obstetrics & Gynecology

## 2010-10-21 ENCOUNTER — Ambulatory Visit
Admission: RE | Admit: 2010-10-21 | Discharge: 2010-10-21 | Disposition: A | Discharge status: Home / Self Care | Payer: BC Managed Care – PPO | Source: Ambulatory Visit | Attending: Obstetrics & Gynecology | Admitting: Obstetrics & Gynecology

## 2010-10-21 ENCOUNTER — Other Ambulatory Visit: Payer: BC Managed Care – PPO

## 2010-10-21 ENCOUNTER — Ambulatory Visit: Payer: BC Managed Care – PPO | Admitting: Obstetrics & Gynecology

## 2010-10-21 VITALS — BP 141/78 | HR 67 | Resp 16 | Ht 67.0 in | Wt 214.0 lb

## 2010-10-21 DIAGNOSIS — N950 Postmenopausal bleeding: Secondary | ICD-10-CM

## 2010-10-21 NOTE — Progress Notes (Signed)
  Subjective:    Patient ID: Erika Strong, female    DOB: 1945-06-12, 65 y.o.   MRN: [id number]  HPI Erika Strong has had some PMB bleeding since June.  I saw her yesterday and scheduled an TVS for today.  She took 600 mg of cytotec last pm and 800 mg of IBU today.   Review of Systems US showed small fiboids and an endometrial lining of 13.4 mm    Objective:   Physical Exam  Her cervix was cleaned with betadine, grasped with a single tooth tenaculem. Pipelle biopsy was done 3 times without difficulty.  A moderate amount of tissue was obtained.  She tolerated the procedure well.      Assessment & Plan:  PMB- she will come back 10-27-10 for her biopsy results.

## 2010-10-23 ENCOUNTER — Telehealth: Payer: Self-pay | Admitting: *Deleted

## 2010-10-23 NOTE — Telephone Encounter (Signed)
Left message for pt to call office.  She needs appt for 10/27/10 with Dr Marice Potter to discuss test results.  LM at Dr Nelwyn Salisbury office to have Harriett Sine his nurse call to make Erika Strong an appt with the first available ONC.

## 2010-10-27 ENCOUNTER — Ambulatory Visit (INDEPENDENT_AMBULATORY_CARE_PROVIDER_SITE_OTHER): Payer: BC Managed Care – PPO | Admitting: Obstetrics & Gynecology

## 2010-10-27 VITALS — BP 148/87 | HR 75 | Wt 216.0 lb

## 2010-10-27 DIAGNOSIS — C55 Malignant neoplasm of uterus, part unspecified: Secondary | ICD-10-CM

## 2010-10-27 NOTE — Progress Notes (Signed)
  Subjective:    Patient ID: Erika Strong, female    DOB: 1945/02/27, 65 y.o.   MRN: [id number]  HPI  Erika Strong is here to discuss her [facility] results.  Adenocarcinoma FIGO grade 1 was discovered  Review of Systems     Objective:   Physical Exam        Assessment & Plan:  I have offered her further care with the GynOnc.  The quickest they can see her is this upcoming week in [place].  She prefers this.  It will be scheduled.

## 2010-10-29 ENCOUNTER — Ambulatory Visit: Payer: BC Managed Care – PPO | Attending: Gynecology | Admitting: Gynecologic Oncology

## 2010-10-29 DIAGNOSIS — E785 Hyperlipidemia, unspecified: Secondary | ICD-10-CM | POA: Insufficient documentation

## 2010-10-29 DIAGNOSIS — I10 Essential (primary) hypertension: Secondary | ICD-10-CM | POA: Insufficient documentation

## 2010-10-29 DIAGNOSIS — C549 Malignant neoplasm of corpus uteri, unspecified: Secondary | ICD-10-CM | POA: Insufficient documentation

## 2010-10-30 NOTE — Consult Note (Signed)
NAME:  Erika Strong, Erika Strong               ACCOUNT NO.:  [id number]  MEDICAL RECORD NO.:  [id number]  LOCATION:  GYN                          FACILITY:  [facility]  PHYSICIAN:  Laurette Schimke, MD     DATE OF BIRTH:  [birth year]  DATE OF CONSULTATION:  10/29/2010 DATE OF DISCHARGE:                                CONSULTATION   REFERRING PHYSICIAN:  Nicholaus Bloom, M.D.  REASON FOR VISIT:  Consultation was requested for management of a grade 1 endometrial cancer.  HISTORY OF PRESENT ILLNESS:  This 65 year old gravida 1, para 1, last normal menstrual period in 1997.  Ms. Jonsson was in her usual state of health until early June 2012, and she noted light vaginal spotting, this spotting persisted and she sought the advice of Dr. Linford Arnold, who directly facilitated referral to Dr. Nicholaus Bloom for evaluation.  A pelvic ultrasound was obtained and was notable for uterus measuring 6.4 x 2.7 x 4.8 cm. A fibroid was noted in the anterior uterine segment and left fundus.  The endometrium was noted to measure 13.4 mm in thickness. Otherwise, the findings were unremarkable.  An endometrial biopsy was obtained on October 21, 2010, and demonstrated endometrioid type grade 1 adenocarcinoma focally involving a polyp.  PAST MEDICAL HISTORY: 1. Diverticulitis in 2010. 2. Hypertension diagnosed 5 years ago. 3. Hyperlipidemia diagnosed 5 years ago.  MEDICATIONS AND ANTIHYPERTENSIVE:  Unspecified  PAST SURGICAL HISTORY:  Denies.  PAST GYN HISTORY:  Normal spontaneous vaginal delivery x1.  No abnormal Pap test.  Menarche occurred at age of 36 with regular menses.  SOCIAL HISTORY:  Ms. [place] has been married for 45 years.  She is the Chiropodist and practitioner.  She denies tobacco or alcohol use.  SCREENING HISTORY:  Mammogram in April 2012, within normal limits. Colonoscopy around the time of evaluation of diverticulitis associated with colonic biopsies, all of which were negative.  FAMILY HISTORY:   Mother with likely colon cancer and father with lung cancer diagnosed at 3.  He was a tobacco user.  ALLERGIES:  LISINOPRIL that causes a cough, STATINS, and BENADRYL that causes palpitations.  REVIEW OF SYSTEMS:  No nausea, vomiting, fever, chills, or abdominal pain.  No flank pain.  No bleeding from the rectum or the bladder. Denies lower extremity edema.  No change in appetite or abdominal bloating.  PHYSICAL EXAMINATION:  GENERAL:  Well-developed female, in no acute distress. VITAL SIGNS:  Height 5 feet 70 inches.  Weight 212 pounds.  BMI of 33. Blood pressure 142/84, pulse 66, and temperature 98.4. CHEST:  Clear to auscultation. HEART:  Regular rate and rhythm. LYMPHATICS:  Lymph node survey, no cervical, supraclavicular, or inguinal adenopathy.  ABDOMEN:  Soft, obese without any masses or hernia. PELVIC EXAMINATION:  Normal external genitalia, Bartholin, urethral, and Skene.  Approximately 3 cm cervix without any lesions.  Good uterine mobility.  No parametrial nodularity.  No cul-de-sac masses. RECTAL EXAMINATION:  Good anal sphincter tone without any masses or nodularity. EXTREMITIES:  1 to 2+ lower extremity edema.  IMPRESSION:  Ms. Weitz is a 65 year old with a grade 1 endometrial cancer.  Options staging versus other more palliative management were discussed with the patient; however,  she opts for surgical staging. Open versus minimally invasive approaches were discussed with the patient; and the risks and benefits of either approach were discussed inclusive of infection, bleeding, damage to surrounding structures, prolonged hospitalization, and reoperation.  The patient's questions and those of her husband were answered completely.  The final decision was for robotic-assisted laparoscopic hysterectomy, bilateral salpingo- oophorectomy, and possible lymph node dissection on Tuesday, October 2.     Laurette Schimke, MD     WB/MEDQ  D:  10/29/2010  T:   10/29/2010  Job:  [id number]  cc:   Allie Bossier, MD  Telford Nab, R.N. [address] [place], [place] [address]  Nani Gasser, M.D. Fax: [phone]  Electronically Signed by Laurette Schimke MD on 10/30/2010 11:02:33 AM

## 2010-11-02 DIAGNOSIS — C541 Malignant neoplasm of endometrium: Secondary | ICD-10-CM

## 2010-11-02 HISTORY — DX: Malignant neoplasm of endometrium: C54.1

## 2010-11-20 ENCOUNTER — Other Ambulatory Visit: Payer: Self-pay | Admitting: Gynecologic Oncology

## 2010-11-20 ENCOUNTER — Ambulatory Visit (HOSPITAL_COMMUNITY)
Admission: RE | Admit: 2010-11-20 | Discharge: 2010-11-20 | Disposition: A | Discharge status: Home / Self Care | Payer: BC Managed Care – PPO | Source: Ambulatory Visit | Attending: Gynecologic Oncology | Admitting: Gynecologic Oncology

## 2010-11-20 ENCOUNTER — Encounter (HOSPITAL_COMMUNITY): Payer: BC Managed Care – PPO

## 2010-11-20 DIAGNOSIS — C55 Malignant neoplasm of uterus, part unspecified: Secondary | ICD-10-CM | POA: Insufficient documentation

## 2010-11-20 DIAGNOSIS — Z01810 Encounter for preprocedural cardiovascular examination: Secondary | ICD-10-CM | POA: Insufficient documentation

## 2010-11-20 DIAGNOSIS — Z01818 Encounter for other preprocedural examination: Secondary | ICD-10-CM | POA: Insufficient documentation

## 2010-11-20 DIAGNOSIS — Z01812 Encounter for preprocedural laboratory examination: Secondary | ICD-10-CM | POA: Insufficient documentation

## 2010-11-20 LAB — CBC
HCT: 42.2 % (ref 36.0–46.0)
Hemoglobin: 14.1 g/dL (ref 12.0–15.0)
MCH: 28.8 pg (ref 26.0–34.0)
MCHC: 33.4 g/dL (ref 30.0–36.0)
MCV: 86.1 fL (ref 78.0–100.0)
Platelets: 312 K/uL (ref 150–400)
RBC: 4.9 MIL/uL (ref 3.87–5.11)
RDW: 13.3 % (ref 11.5–15.5)
WBC: 10.2 K/uL (ref 4.0–10.5)

## 2010-11-20 LAB — DIFFERENTIAL
Basophils Absolute: 0 10*3/uL (ref 0.0–0.1)
Basophils Relative: 0 % (ref 0–1)
Eosinophils Absolute: 0.2 10*3/uL (ref 0.0–0.7)
Eosinophils Relative: 2 % (ref 0–5)
Lymphs Abs: 2.9 10*3/uL (ref 0.7–4.0)
Neutrophils Relative %: 60 % (ref 43–77)

## 2010-11-20 LAB — COMPREHENSIVE METABOLIC PANEL
ALT: 24 U/L (ref 0–35)
AST: 25 U/L (ref 0–37)
Albumin: 3.8 g/dL (ref 3.5–5.2)
Alkaline Phosphatase: 118 U/L — ABNORMAL HIGH (ref 39–117)
Calcium: 9.8 mg/dL (ref 8.4–10.5)
GFR calc Af Amer: >60 mL/min (ref >60)
Glucose, Bld: 89 mg/dL (ref 70–99)
Potassium: 3.4 mEq/L — ABNORMAL LOW (ref 3.5–5.1)
Sodium: 137 mEq/L (ref 135–145)
Total Protein: 7.6 g/dL (ref 6.0–8.3)

## 2010-11-24 ENCOUNTER — Other Ambulatory Visit: Payer: Self-pay | Admitting: Gynecologic Oncology

## 2010-11-24 ENCOUNTER — Ambulatory Visit (HOSPITAL_COMMUNITY)
Admission: AD | Admit: 2010-11-24 | Discharge: 2010-11-25 | DRG: 355 | Disposition: A | Discharge status: Home / Self Care | Payer: BC Managed Care – PPO | Source: Ambulatory Visit | Attending: Obstetrics & Gynecology | Admitting: Obstetrics & Gynecology

## 2010-11-24 DIAGNOSIS — E7800 Pure hypercholesterolemia, unspecified: Secondary | ICD-10-CM | POA: Diagnosis present

## 2010-11-24 DIAGNOSIS — I10 Essential (primary) hypertension: Secondary | ICD-10-CM | POA: Diagnosis present

## 2010-11-24 DIAGNOSIS — Z01810 Encounter for preprocedural cardiovascular examination: Secondary | ICD-10-CM

## 2010-11-24 DIAGNOSIS — K5730 Diverticulosis of large intestine without perforation or abscess without bleeding: Secondary | ICD-10-CM | POA: Diagnosis present

## 2010-11-24 DIAGNOSIS — Z01812 Encounter for preprocedural laboratory examination: Secondary | ICD-10-CM

## 2010-11-24 DIAGNOSIS — C549 Malignant neoplasm of corpus uteri, unspecified: Secondary | ICD-10-CM | POA: Diagnosis present

## 2010-11-24 HISTORY — PX: ABDOMINAL HYSTERECTOMY: SHX81

## 2010-11-24 HISTORY — PX: GONADECTOMY AND HYSTERECTOMY: SHX1709

## 2010-11-24 LAB — SAMPLE TO BLOOD BANK

## 2010-11-25 ENCOUNTER — Encounter: Payer: Self-pay | Admitting: Family Medicine

## 2010-11-25 LAB — CBC
MCH: 29.2 pg (ref 26.0–34.0)
MCHC: 34 g/dL (ref 30.0–36.0)
Platelets: 283 10*3/uL (ref 150–400)
RBC: 4.32 MIL/uL (ref 3.87–5.11)

## 2010-11-30 NOTE — Op Note (Signed)
NAME:  Erika Strong, Erika Strong               ACCOUNT NO.:  [id number]  MEDICAL RECORD NO.:  [id number]  LOCATION:  1526                         FACILITY:  [facility]  PHYSICIAN:  Laurette Schimke, MD     DATE OF BIRTH:  Feb 11, 1946  DATE OF PROCEDURE:  11/24/2010 DATE OF DISCHARGE:                              OPERATIVE REPORT   PREOPERATIVE DIAGNOSIS:  Endometrial cancer.  POSTOPERATIVE DIAGNOSIS:  Endometrial cancer.  PROCEDURES:  Robotic assisted laparoscopic hysterectomy, bilateral salpingo-oophorectomy, right pelvic lymph node dissection, left pelvic lymph node sampling.  SURGEON:  Laurette Schimke, MD, PhD  FIRST ASSISTANT: 1. Roseanna Rainbow, M.D. 2. Telford Nab, R.N.  ANESTHESIA:  General endotracheal.  FINDINGS:  Small uterus, left colon adherent to the left fimbria, small approximately 7 mm lymph node visible, right pelvic lymph node dissection.  DESCRIPTION OF PROCEDURE:  The patient was taken to the operating room and placed under general endotracheal anesthesia.  Acromioclavicular paddings were placed and the arms were tucked to her side with some anterior.  The abdomen, pelvis, and vagina were prepped.  The uterus was sounded to 6 cm and an uterine manipulator inserted.  A vaginal balloon was placed, also present was a small Koh ring.  The patient was then prepped.  A 5-mm trocar was placed in the left upper quadrant and an OptiVu inserted into the left upper quadrant under direct visualization. With entry into the abdomen, the patient was placed in Trendelenburg. It became evident that there was uterine perforation and balloon was out of the fundus that was subsequently withdrawn into the uterine cavity. An adhesion was noted to the left from the epiploic of the colon to the left fimbria.  10 mm incisions were made approximately 1 cm above the umbilicus.  1 cm incisions were made 10 cm lateral to the umbilical incision and a port was placed approximately 2 cm  over the anterior superior iliac spine. Trocars were inserted.  The left upper quadrant trocar was replaced for a 10-mm trocar.  With optimal Trendelenburg in place, the adhesions from the epiploic fat to the fimbria was transected and the small bowel was removed from the pelvis into the upper abdomen.  The robot was then docked.  Pressure was maintained at 15 mmHg for the entirety of the procedure. The right round ligament was transected, the retroperitoneal space entered, the ureter identified.  An opening was made in the broad ligament superior to the ureter and the ovarian vessels were sealed and transected.  The broad ligament was dissected down to the level of the Koh ring both anterior and posteriorly with development of the anterior bladder flap.  The uterine vessels were skeletonized on the right, cauterized and transected.  The left round ligament was dissected, transected, the retroperitoneal space entered.  The left ureter was identified.  An opening was made in the broad ligament superior to the ureter.  The utero-ovarian left vessels were sealed and transected.  The broad ligament was dissected both anteriorly and posteriorly to the level of the coring.  Left uterine vessels were skeletonized and the area cauterized.  The vaginal balloon was optimally insufflated and a vaginal colpotomy was made.  The uterus,  cervix, ovaries, and tubes were then delivered into the vagina.  The balloon was returned back to the vagina.  The Koh ring was determined to have been removed from the vaginal cavity.  The right pelvic lymph node dissection was performed within the boundaries of the superior vesical artery, the genitofemoral nerve, the obturator nerve inferiorly, and the ureter.  Dissection was done sharply.  Obturator was identified prior to dissection of the nodal tissue inferiorly.  Upon dissection 7 mm nodes were appreciated.  In the contralateral fashion, the left pelvic lymph  node dissection was begun.  During this dissection, the frozen section returned with evidence of minimal if any myometrial invasion.  At that time, nodal tissue was already removed from the external iliac artery and vein and decision was made not to remove the nodes superior to the obturator nerve.  The entire pelvis was irrigated and drained.  Hemostasis was assured.  Nodal specimens in the bag were removed from the left upper quadrant trocar.  The vaginal cuff was closed with 0 Vicryl suture in a running fashion.  Hemostasis was assured.  The instruments were removed and the abdomen was desufflated.  The sponge stick was used to remove residual operative products from the vagina.  The port sites were all copiously irrigated.  The fascia of the umbilical incision was closed. The subcutaneous tissue over the left upper quadrant incision was approximated.  The skin was closed with running 4-0 Vicryl suture. Sponge and needle counts were correct x3.  DRAINS:  Foley draining clear urine.  DISPOSITION:  The patient was taken to the recovery room in stable condition.     Laurette Schimke, MD     WB/MEDQ  D:  11/24/2010  T:  11/24/2010  Job:  [id number]  cc:   Telford Nab, R.N. [address] [place], [place] [address]  Roseanna Rainbow, M.D. Fax: [phone]  Allie Bossier, MD  Nani Gasser, M.D. Fax: [phone]  Electronically Signed by Laurette Schimke MD on 11/30/2010 03:14:08 PM

## 2010-12-24 HISTORY — PX: MOLE REMOVAL: SHX2046

## 2010-12-30 NOTE — Progress Notes (Signed)
Consult Note: Gyn-Onc  Erika Strong 65 y.o. female  CC:  Chief Complaint  Patient presents with  . Follow-up    Endomet cancer  . Vaginal Bleeding    since surgery    HPI: This 65 year old gravida 1, para 1, last normal menstrual period in 1997.  Erika Strong was in her usual state of health until early June 2012, and she noted light vaginal spotting, this spotting persisted and she sought the advice of Dr. Linford Arnold, who directly facilitated referral to Dr. Nicholaus Bloom for evaluation.  A pelvic ultrasound was obtained and was notable for uterus measuring 6.4 x 2.7 x 4.8 cm. A fibroid was noted in the anterior uterine segment and left fundus.  The endometrium was noted to measure 13.4 mm in thickness. Otherwise, the findings were unremarkable.  An endometrial biopsy was obtained on October 21, 2010, and demonstrated endometrioid type grade 1 adenocarcinoma focally involving a polyp.   Interval History: on 11/24/2010 she underwent RTLH BSO RPLND LPLNS  Pathology  endometrial adenocarcinoma with focal superficial myometrial invasion. A invasion was 0.2 cm where the myometrium is 1.4 cm there was no lymph vascular space invasion 0 of 20 lymph nodes were involved with tumor. The main Maron reports trace vaginal bleeding in the morning over the last 2-3 weeks. She reports heavy lifting and recent constipation.  Performance status 0  Review of Systems  Constitutional  Feels well minimal pain or discomfort the Skin/Breast  No rash, sores, jaundice, itching, dryness, lumps, swelling, breast pain, or nipple discharge. Age spots  Cardiovascular  No chest pain, shortness of breath, or edema  Pulmonary  No cough or wheeze.  Gastro Intestinal  No nausea, vomitting, or diarrhoea. No bright red blood per rectum, no abdominal pain, minimal constipation that has since.  Genito Urinary  No frequency, urgency, dysuria, vaginal spotting, trace amount only in the morning over the last 2-3 weeks Musculo  Skeletal  No myalgia, arthralgia, joint swelling or pain  Neurologic  No weakness, numbness, change in gait,  Psychology  No depression, anxiety, insomnia.    Current Meds:  Outpatient Encounter Prescriptions as of 01/21/2011  Medication Sig Dispense Refill  . Ascorbic Acid (VITAMIN C) 1000 MG tablet Take 1,000 mg by mouth daily.        . calcium carbonate (OS-CAL) 600 MG TABS Take 600 mg by mouth 2 (two) times daily with a meal.        . chlorthalidone (HYGROTON) 50 MG tablet Take 1 tablet (50 mg total) by mouth daily.  30 tablet  5  . fish oil-omega-3 fatty acids 1000 MG capsule Take 2 g by mouth 2 (two) times daily.        . Magnesium 250 MG TABS Take 250 mg by mouth daily.        . Multiple Vitamin (MULTIVITAMIN) tablet Take 1 tablet by mouth 2 (two) times daily.        . Nutritional Supplements (ADULT NUTRITIONAL SUPPLEMENT PO) Take by mouth daily. Protandim       . colesevelam (WELCHOL) 625 MG tablet Take 1,875 mg by mouth 2 (two) times daily with a meal.        . misoprostol (CYTOTEC) 200 MCG tablet She should take 3 pills tonight.  3 tablet  0    Allergy:  Allergies  Allergen Reactions  . Atorvastatin     REACTION: myalgias  . Benadryl (Diphenhydramine Hcl)     Causes palpitations  . Lisinopril  REACTION: Cough  . Pravastatin Sodium     REACTION: myalgias  . Simvastatin     REACTION: myalgias    Social Hx:   History   Social History  . Marital Status: Married    Spouse Name: N/A    Number of Children: N/A  . Years of Education: Assoc degr   Occupational History  . cosmotologist/teacher    Social History Main Topics  . Smoking status: Never Smoker   . Smokeless tobacco: Never Used  . Alcohol Use: No  . Drug Use: No  . Sexually Active: No     cosmetologist, teaches, associate degree, married, regularly exercises.    Other Topics Concern  . Not on file   Social History Narrative  . No narrative on file    Past Surgical Hx:  Past Surgical  History  Procedure Date  . Gonadectomy and hysterectomy 11/24/2010    Endometrial cancer, performed by Dr. Laurette Schimke  . Mole removal Nov 2012  Robotic lsc hysterectomy BSO RPLND LPLNS 11/24/2010  Past Medical Hx:  Past Medical History  Diagnosis Date  . Wears glasses   . Wears glasses   . Hypertension   . Diverticulitis 2010  . Hyperlipidemia     2007  . Cancer   . Endometrial cancer 11/02/2010    Family Hx:  Family History  Problem Relation Age of Onset  . Lung cancer Father     smoker  . Cancer Father   . Osteoporosis Mother   . Rheum arthritis Mother   . Cancer Mother     Vitals:  Blood pressure 138/80, pulse 64, temperature 98.5 F (36.9 C), temperature source Oral, resp. rate 18, height 5' 6.58" (1.691 m), weight 216 lb 9.6 oz (98.249 kg).  Physical Exam: Neck  Supple without any enlargements.  Cardiovascular  Pulse normal rate, regularity and rhythm. S1 and S2 normal, without any murmur, rub, or gallop.  Lungs  Clear to auscultation bilateraly, without wheezes/crackles/rhonchi. Good air movement.  Skin  No rash/lesions/breakdown  Psychiatry  Alert and oriented to person, place, and time  Abdomen  Normoactive bowel sounds, abdomen soft, non-tender and obese. Surgical  sites intact without evidence of hernia.  Genito Urinary  Vulva/vagina: Normal external female genitalia.  No lesions. Trace blood noted within the vaginal vault. There was no separation of the edges no evidence of granulation tissue. Bladder/urethra:  No lesions or masses  No cul-de-sac masses or fullness  Cervix: Normal appearing, no lesions.  Extremities  No bilateral cyanosis, clubbing or edema. No rash, lesions or petiche.    Assessment/Plan: Erika Strong is a 65 year old with a stage IA grade 1 endometrial adenocarcinoma. She was counseled regarding the signs and symptoms of recurrent disease. She was cautioned against heavy lifting and the importance of not being constipated was  impressed upon the patient and her husband. I have asked her to continue pelvic rest until mid December she's been advised that if they're still spotting after that time she should contact me for further assessment. Otherwise I've asked her to followup with Dr. Marice Potter in 6 months with the GYN oncology service in 12 months. I have advised Erika Strong is Dr. Marice Potter we'll collect her Pap test annually that no adjuvant radiation or chemotherapy is required.   Laurette Schimke, MD 01/21/2011, 9:29 AM

## 2011-01-01 ENCOUNTER — Ambulatory Visit (INDEPENDENT_AMBULATORY_CARE_PROVIDER_SITE_OTHER): Payer: BC Managed Care – PPO | Admitting: Family Medicine

## 2011-01-01 DIAGNOSIS — Z23 Encounter for immunization: Secondary | ICD-10-CM

## 2011-01-01 NOTE — Progress Notes (Signed)
Here for flu shot

## 2011-01-20 ENCOUNTER — Encounter: Payer: Self-pay | Admitting: *Deleted

## 2011-01-21 ENCOUNTER — Ambulatory Visit: Payer: BC Managed Care – PPO | Attending: Gynecologic Oncology | Admitting: Gynecologic Oncology

## 2011-01-21 ENCOUNTER — Encounter: Payer: Self-pay | Admitting: Gynecologic Oncology

## 2011-01-21 VITALS — BP 138/80 | HR 64 | Temp 98.5°F | Resp 18 | Ht 66.58 in | Wt 216.6 lb

## 2011-01-21 DIAGNOSIS — N898 Other specified noninflammatory disorders of vagina: Secondary | ICD-10-CM | POA: Insufficient documentation

## 2011-01-21 DIAGNOSIS — E785 Hyperlipidemia, unspecified: Secondary | ICD-10-CM | POA: Insufficient documentation

## 2011-01-21 DIAGNOSIS — C549 Malignant neoplasm of corpus uteri, unspecified: Secondary | ICD-10-CM

## 2011-01-21 DIAGNOSIS — Z79899 Other long term (current) drug therapy: Secondary | ICD-10-CM | POA: Insufficient documentation

## 2011-01-21 DIAGNOSIS — I10 Essential (primary) hypertension: Secondary | ICD-10-CM | POA: Insufficient documentation

## 2011-01-21 HISTORY — DX: Malignant neoplasm of corpus uteri, unspecified: C54.9

## 2011-01-21 NOTE — Patient Instructions (Signed)
Followup with Dr. Marice Potter in 6 months with the GYN oncology service in 12 months

## 2011-02-09 ENCOUNTER — Encounter: Payer: Self-pay | Admitting: Family Medicine

## 2011-02-12 ENCOUNTER — Ambulatory Visit (INDEPENDENT_AMBULATORY_CARE_PROVIDER_SITE_OTHER): Payer: BC Managed Care – PPO | Admitting: Family Medicine

## 2011-02-12 ENCOUNTER — Encounter: Payer: Self-pay | Admitting: Family Medicine

## 2011-02-12 VITALS — BP 136/76 | HR 72 | Wt 214.0 lb

## 2011-02-12 DIAGNOSIS — I10 Essential (primary) hypertension: Secondary | ICD-10-CM

## 2011-02-12 LAB — LIPID PANEL
Cholesterol: 234 mg/dL — ABNORMAL HIGH (ref 0–200)
HDL: 47 mg/dL (ref >39)
Total CHOL/HDL Ratio: 5 Ratio

## 2011-02-12 LAB — BASIC METABOLIC PANEL
Calcium: 9.5 mg/dL (ref 8.4–10.5)
Potassium: 3.8 mEq/L (ref 3.5–5.3)
Sodium: 141 mEq/L (ref 135–145)

## 2011-02-12 MED ORDER — CHLORTHALIDONE 50 MG PO TABS: 50.0000 mg | ORAL_TABLET | Freq: Every day | ORAL | Status: DC

## 2011-02-12 NOTE — Progress Notes (Signed)
  Subjective:    Patient ID: Erika Strong, female    DOB: March 14, 1945, 65 y.o.   MRN: [id number]  Hypertension This is a chronic problem. The current episode started more than 1 year ago. The problem has been gradually improving since onset. The problem is controlled. Pertinent negatives include no blurred vision, chest pain or shortness of breath. There are no associated agents to hypertension. Past treatments include diuretics. Compliance problems include exercise.    She had her complete hysterectomy in October for endometrial cancer and has done really well since then. Says her ankle swelling and bloating have resolved since her surgery.   Review of Systems  Eyes: Negative for blurred vision.  Respiratory: Negative for shortness of breath.   Cardiovascular: Negative for chest pain.       Objective:   Physical Exam  Constitutional: She is oriented to person, place, and time. She appears well-developed and well-nourished.  HENT:  Head: Normocephalic and atraumatic.  Cardiovascular: Normal rate, regular rhythm and normal heart sounds.   Pulmonary/Chest: Effort normal and breath sounds normal.  Musculoskeletal: She exhibits no edema.  Neurological: She is alert and oriented to person, place, and time.  Skin: Skin is warm and dry.  Psychiatric: She has a normal mood and affect. Her behavior is normal.          Assessment & Plan:  HTN - Well controlle.d Due for labs. F/U in 6 months. Ankle swelling has resolved. Work on getting more regular exercise and weight loss.

## 2011-05-31 ENCOUNTER — Other Ambulatory Visit: Payer: Self-pay | Admitting: Family Medicine

## 2011-05-31 DIAGNOSIS — Z1231 Encounter for screening mammogram for malignant neoplasm of breast: Secondary | ICD-10-CM

## 2011-06-29 ENCOUNTER — Encounter: Payer: Self-pay | Admitting: Obstetrics & Gynecology

## 2011-06-29 ENCOUNTER — Ambulatory Visit (INDEPENDENT_AMBULATORY_CARE_PROVIDER_SITE_OTHER): Payer: BC Managed Care – PPO | Admitting: Obstetrics & Gynecology

## 2011-06-29 VITALS — BP 141/82 | HR 81 | Ht 68.0 in | Wt 216.0 lb

## 2011-06-29 DIAGNOSIS — C541 Malignant neoplasm of endometrium: Secondary | ICD-10-CM

## 2011-06-29 DIAGNOSIS — C549 Malignant neoplasm of corpus uteri, unspecified: Secondary | ICD-10-CM

## 2011-06-29 NOTE — Progress Notes (Signed)
  Subjective:    Patient ID: Erika Strong, female    DOB: [birth year], 66 y.o.   MRN: [id number]  HPI  Erika Strong is here for a follow up pap smear 6 months after her RATH/BSO/lymphadenectomy for Stage 1 endometrial cancer. She has no complaints. She has not had sex yet because she was afraid that she would damage her vagina.  Review of Systems     Objective:   Physical Exam   Atrophic vulva, normal appearing cuff (pap done) Normal bimanual exam     Assessment & Plan:  Routine every 6 month paps for follow up of endometrial cancer. I have given her reassurance about the safely of sex (lube recommended).

## 2011-07-06 ENCOUNTER — Ambulatory Visit
Admission: RE | Admit: 2011-07-06 | Discharge: 2011-07-06 | Disposition: A | Discharge status: Home / Self Care | Payer: BC Managed Care – PPO | Source: Ambulatory Visit | Attending: Family Medicine | Admitting: Family Medicine

## 2011-07-06 DIAGNOSIS — Z1231 Encounter for screening mammogram for malignant neoplasm of breast: Secondary | ICD-10-CM

## 2011-08-20 ENCOUNTER — Encounter: Payer: Self-pay | Admitting: *Deleted

## 2011-08-27 ENCOUNTER — Ambulatory Visit (INDEPENDENT_AMBULATORY_CARE_PROVIDER_SITE_OTHER): Payer: BC Managed Care – PPO | Admitting: Family Medicine

## 2011-08-27 ENCOUNTER — Encounter: Payer: Self-pay | Admitting: Family Medicine

## 2011-08-27 VITALS — BP 125/78 | HR 65 | Ht 66.5 in | Wt 213.0 lb

## 2011-08-27 DIAGNOSIS — Z780 Asymptomatic menopausal state: Secondary | ICD-10-CM

## 2011-08-27 DIAGNOSIS — E785 Hyperlipidemia, unspecified: Secondary | ICD-10-CM

## 2011-08-27 DIAGNOSIS — N959 Unspecified menopausal and perimenopausal disorder: Secondary | ICD-10-CM

## 2011-08-27 DIAGNOSIS — E8881 Metabolic syndrome: Secondary | ICD-10-CM

## 2011-08-27 DIAGNOSIS — I10 Essential (primary) hypertension: Secondary | ICD-10-CM

## 2011-08-27 NOTE — Patient Instructions (Signed)

## 2011-08-27 NOTE — Progress Notes (Signed)
  Subjective:    Patient ID: Erika Strong, female    DOB: 1945/11/14, 66 y.o.   MRN: [id number]  HPI HTN - No CP or SOB.  Taking her medications reguarly.  Did eat salt yesterday for July 4th. No decogestants. Home BP was goot his morning.   Hyperlipidemia-she is not taking the medication she was prescribed. She's also had intolerance to 3 other statins. She has been really working on her diet and has actually lost about 2-3 pounds.  Review of Systems     Objective:   Physical Exam  Constitutional: She is oriented to person, place, and time. She appears well-developed and well-nourished.  HENT:  Head: Normocephalic and atraumatic.  Cardiovascular: Normal rate, regular rhythm and normal heart sounds.   Pulmonary/Chest: Effort normal and breath sounds normal.  Neurological: She is alert and oriented to person, place, and time.  Skin: Skin is warm and dry.  Psychiatric: She has a normal mood and affect. Her behavior is normal.          Assessment & Plan:  HTN- doing well.  Says home BPs have been well controlled Has lost a couple of pounds.  Congratulated her on this. She has tried to make some dietary changes.  Looks great! Work on exercise. F/U in 4 months.   Hyperlipidemia -she's due to recheck her cholesterol. She has tried present in the past and has not done well with it. We had a discussion about a trial of Livalo. She said she would actually be willing to do this. We will recheck her lipids today off of medication. She was given samples of the 4 mg tabs today. That's all we had done her encouraged her to cut them in half and take half a tab at bedtime. This should last for 2 weeks. At that time a call back to see if we have samples for 2 more weeks. That way if she tolerates that well for a month we can get her prescription. It likely will be expensive because it does not have a generic available.  Due for DEXA - Will place order.

## 2011-09-03 ENCOUNTER — Other Ambulatory Visit: Payer: Self-pay | Admitting: Family Medicine

## 2011-09-16 ENCOUNTER — Encounter: Payer: Self-pay | Admitting: Family Medicine

## 2011-09-16 ENCOUNTER — Ambulatory Visit (INDEPENDENT_AMBULATORY_CARE_PROVIDER_SITE_OTHER): Payer: BC Managed Care – PPO | Admitting: Family Medicine

## 2011-09-16 VITALS — BP 127/78 | HR 63 | Ht 66.5 in | Wt 212.0 lb

## 2011-09-16 DIAGNOSIS — E663 Overweight: Secondary | ICD-10-CM

## 2011-09-16 DIAGNOSIS — Z0000 Encounter for general adult medical examination without abnormal findings: Secondary | ICD-10-CM

## 2011-09-16 LAB — COMPLETE METABOLIC PANEL WITH GFR
ALT: 34 U/L (ref 0–35)
AST: 31 U/L (ref 0–37)
Albumin: 4.1 g/dL (ref 3.5–5.2)
Calcium: 9.4 mg/dL (ref 8.4–10.5)
Chloride: 101 mEq/L (ref 96–112)
Potassium: 3.4 mEq/L — ABNORMAL LOW (ref 3.5–5.3)
Sodium: 140 mEq/L (ref 135–145)
Total Protein: 6.7 g/dL (ref 6.0–8.3)

## 2011-09-16 LAB — LIPID PANEL
LDL Cholesterol: 125 mg/dL — ABNORMAL HIGH (ref 0–99)
VLDL: 30 mg/dL (ref 0–40)

## 2011-09-16 NOTE — Progress Notes (Signed)
  Subjective:     Erika Strong is a 66 y.o. female and is here for a comprehensive physical exam. The patient reports no problems.  History   Social History  . Marital Status: Married    Spouse Name: Yehuda Mao    Number of Children: 1   . Years of Education: Assoc degr   Occupational History  . cosmotologist/teacher   .  [employer]   Social History Main Topics  . Smoking status: Never Smoker   . Smokeless tobacco: Never Used  . Alcohol Use: No  . Drug Use: No  . Sexually Active: No     cosmetologist, teaches, associate degree, married, regularly exercises.    Other Topics Concern  . Not on file   Social History Narrative   Walks daily for exercise.    Health Maintenance  Topic Date Due  . Influenza Vaccine  11/23/2011  . Mammogram  07/05/2013  . Pap Smear  06/29/2014  . Tetanus/tdap  04/12/2017  . Colonoscopy  07/03/2019  . Zostavax  Completed    The following portions of the patient's history were reviewed and updated as appropriate: allergies, current medications, past family history, past medical history, past social history, past surgical history and problem list.  Review of Systems A comprehensive review of systems was negative.   Objective:    BP 127/78  Pulse 63  Ht 5' 6.5" (1.689 m)  Wt 212 lb (96.163 kg)  BMI 33.71 kg/m2 General appearance: alert, cooperative, appears stated age and moderately obese Head: Normocephalic, without obvious abnormality, atraumatic Eyes: conj clear EOMI, PEERLA, wears glasses Ears: normal TM's and external ear canals both ears Nose: Nares normal. Septum midline. Mucosa normal. No drainage or sinus tenderness. Throat: lips, mucosa, and tongue normal; teeth and gums normal Neck: no adenopathy, no carotid bruit, no JVD, supple, symmetrical, trachea midline and thyroid not enlarged, symmetric, no tenderness/mass/nodules Back: symmetric, no curvature. ROM normal. No CVA tenderness. Lungs: clear to auscultation  bilaterally Heart: regular rate and rhythm, S1, S2 normal, no murmur, click, rub or gallop Abdomen: soft, non-tender; bowel sounds normal; no masses,  no organomegaly Extremities: extremities normal, atraumatic, no cyanosis or edema Pulses: 2+ and symmetric Skin: Skin color, texture, turgor normal. No rashes or lesions Lymph nodes: Cervical, supraclavicular, and axillary nodes normal. Neurologic: Alert and oriented X 3, normal strength and tone. Normal symmetric reflexes. Normal coordination and gait    Assessment:    Healthy female exam.     Plan:     See After Visit Summary for Counseling Recommendations  Start a regular exercise program and make sure you are eating a healthy diet Try to eat 4 servings of dairy a day  Your vaccines are up to date.  We will get her DEXA scan scheduled in the next couple weeks. Her mammogram is up-to-date. Colonoscopy is up-to-date. Tdap and vaccinations are up-to-date. She just had her Pap smear in May and was normal.  Hyperlipidemia - she never tried the Livalo samples. And she is not taking her WelChol currently. She wants to go ahead and see what her cholesterol looks like today off of medication. Though likely she will need to start one or the other and then we'll need to recheck her cholesterol in a month.

## 2011-09-16 NOTE — Patient Instructions (Addendum)
Start a regular exercise program and make sure you are eating a healthy diet Try to eat 4 servings of dairy a day  Your vaccines are up to date.   

## 2011-09-28 ENCOUNTER — Ambulatory Visit (INDEPENDENT_AMBULATORY_CARE_PROVIDER_SITE_OTHER): Payer: BC Managed Care – PPO

## 2011-09-28 ENCOUNTER — Other Ambulatory Visit: Payer: Self-pay | Admitting: Family Medicine

## 2011-09-28 DIAGNOSIS — Z780 Asymptomatic menopausal state: Secondary | ICD-10-CM

## 2011-09-28 DIAGNOSIS — M810 Age-related osteoporosis without current pathological fracture: Secondary | ICD-10-CM

## 2011-09-28 DIAGNOSIS — N959 Unspecified menopausal and perimenopausal disorder: Secondary | ICD-10-CM

## 2011-09-28 DIAGNOSIS — M948X9 Other specified disorders of cartilage, unspecified sites: Secondary | ICD-10-CM

## 2011-10-04 ENCOUNTER — Encounter: Payer: Self-pay | Admitting: *Deleted

## 2011-12-06 ENCOUNTER — Encounter: Payer: Self-pay | Admitting: Family Medicine

## 2011-12-06 ENCOUNTER — Ambulatory Visit (INDEPENDENT_AMBULATORY_CARE_PROVIDER_SITE_OTHER): Payer: BC Managed Care – PPO | Admitting: Family Medicine

## 2011-12-06 VITALS — BP 160/83 | HR 95 | Temp 98.3°F | Ht 67.0 in | Wt 211.0 lb

## 2011-12-06 DIAGNOSIS — R399 Unspecified symptoms and signs involving the genitourinary system: Secondary | ICD-10-CM

## 2011-12-06 DIAGNOSIS — J069 Acute upper respiratory infection, unspecified: Secondary | ICD-10-CM

## 2011-12-06 DIAGNOSIS — R05 Cough: Secondary | ICD-10-CM

## 2011-12-06 DIAGNOSIS — R059 Cough, unspecified: Secondary | ICD-10-CM

## 2011-12-06 DIAGNOSIS — R3989 Other symptoms and signs involving the genitourinary system: Secondary | ICD-10-CM

## 2011-12-06 DIAGNOSIS — M25511 Pain in right shoulder: Secondary | ICD-10-CM

## 2011-12-06 DIAGNOSIS — M25519 Pain in unspecified shoulder: Secondary | ICD-10-CM

## 2011-12-06 LAB — POCT URINALYSIS DIPSTICK
Bilirubin, UA: NEGATIVE
Ketones, UA: NEGATIVE
pH, UA: 7.5

## 2011-12-06 MED ORDER — CIPROFLOXACIN HCL 500 MG PO TABS: 500.0000 mg | ORAL_TABLET | Freq: Two times a day (BID) | ORAL | Status: DC

## 2011-12-06 NOTE — Progress Notes (Signed)
Subjective:    Patient ID: Erika Strong, female    DOB: [birth year], 67 y.o.   MRN: [id number]  HPI 7 days ago Says held her urine and they went when she got home and noticed some burning when she went.  She started some AZO and says did feel better.  Says felt great Thrusday but then the next day on Friday woke up with right shoulder pain. Says it got worse as the day progressed.  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 Then woke up with scratchy throat and congestoin on Saturday.  No fever. No nasal congestion. Some phlegm in the chest.  Taking an herbal remedy.    She did fall on her right shoulder a year ago and didn't seek care. Just did heat and ice and got better.  Says Pain ois mostly on the posterior side of the shoulder.  Did take some IBU yesterday for her shoulder - helped some.   She can move her shoulder around without sig problems. She woke up Friday, 4 days ago with right shoulder pain. It got worse as the day progressed. She feels a little bit better today but is still sore. Initially her pain was radiating down the back side of her upper arm. Today it mostly hurts in the back part of her shoulder. Just above the scapula.  Review of Systems     Objective:   Physical Exam  Constitutional: She is oriented to person, place, and time. She appears well-developed and well-nourished.  HENT:  Head: Normocephalic and atraumatic.  Right Ear: External ear normal.  Left Ear: External ear normal.  Nose: Nose normal.  Mouth/Throat: Oropharynx is clear and moist.       TMs and canals are clear.   Eyes: Conjunctivae normal and EOM are normal. Pupils are equal, round, and reactive to light.  Neck: Neck supple. No thyromegaly present.  Cardiovascular: Normal rate, regular rhythm and normal heart sounds.   Pulmonary/Chest: Effort normal and breath sounds normal. She has no wheezes.  Musculoskeletal:       Right shoulder with fiarly NROM but has dec  range with reaching to the middle of her back. Her right scapula is more prominent and her right shoulder sits higher than her left.  Dec strength with lift off her low back om her left.  Nontender on her shoulder joint. She also has some mild scoliosis.  Lymphadenopathy:    She has no cervical adenopathy.  Neurological: She is alert and oriented to person, place, and time.  Skin: Skin is warm and dry.  Psychiatric: She has a normal mood and affect.          Assessment & Plan:  Dysuria - UA is + leuk and blood.  Will treat with Cipro x3 days. Call if not significantly better in one week.  Right shoulder Pain - she does have some weakness with liftoff test. She has normal extension. The joint itself is not tender or swollen. 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 Will refer to Dr.  Benjamin Stain for further evaluation. She can continue his ibuprofen as needed. I did not order x-rays today.  Cough/Congestion - Likely viral, but calll if not better in one week.  Continue symptomatic care.  Elevated BP -Says home BPs have been well controlled.  Will continue to  monitor  She wants to wait until November for her flu shot.

## 2011-12-06 NOTE — Patient Instructions (Signed)
Urinary Tract Infection Urinary tract infections (UTIs) can develop anywhere along your urinary tract. Your urinary tract is your body's drainage system for removing wastes and extra water. Your urinary tract includes two kidneys, two ureters, a bladder, and a urethra. Your kidneys are a pair of bean-shaped organs. Each kidney is about the size of your fist. They are located below your ribs, one on each side of your spine. CAUSES Infections are caused by microbes, which are microscopic organisms, including fungi, viruses, and bacteria. These organisms are so small that they can only be seen through a microscope. Bacteria are the microbes that most commonly cause UTIs. SYMPTOMS  Symptoms of UTIs may vary by age and gender of the patient and by the location of the infection. Symptoms in young women typically include a frequent and intense urge to urinate and a painful, burning feeling in the bladder or urethra during urination. Older women and men are more likely to be tired, shaky, and weak and have muscle aches and abdominal pain. A fever may mean the infection is in your kidneys. Other symptoms of a kidney infection include pain in your back or sides below the ribs, nausea, and vomiting. DIAGNOSIS To diagnose a UTI, your caregiver will ask you about your symptoms. Your caregiver also will ask to provide a urine sample. The urine sample will be tested for bacteria and white blood cells. White blood cells are made by your body to help fight infection. TREATMENT  Typically, UTIs can be treated with medication. Because most UTIs are caused by a bacterial infection, they usually can be treated with the use of antibiotics. The choice of antibiotic and length of treatment depend on your symptoms and the type of bacteria causing your infection. HOME CARE INSTRUCTIONS  If you were prescribed antibiotics, take them exactly as your caregiver instructs you. Finish the medication even if you feel better after you  have only taken some of the medication.  Drink enough water and fluids to keep your urine clear or pale yellow.  Avoid caffeine, tea, and carbonated beverages. They tend to irritate your bladder.  Empty your bladder often. Avoid holding urine for long periods of time.  Empty your bladder before and after sexual intercourse.  After a bowel movement, women should cleanse from front to back. Use each tissue only once. SEEK MEDICAL CARE IF:   You have back pain.  You develop a fever.  Your symptoms do not begin to resolve within 3 days. SEEK IMMEDIATE MEDICAL CARE IF:   You have severe back pain or lower abdominal pain.  You develop chills.  You have nausea or vomiting.  You have continued burning or discomfort with urination. MAKE SURE YOU:   Understand these instructions.  Will watch your condition.  Will get help right away if you are not doing well or get worse. Document Released: 11/18/2004 Document Revised: 08/10/2011 Document Reviewed: 03/19/2011 Exit[employer] Patient Information 2013 [employer].  

## 2011-12-07 ENCOUNTER — Ambulatory Visit (INDEPENDENT_AMBULATORY_CARE_PROVIDER_SITE_OTHER): Payer: BC Managed Care – PPO | Admitting: Sports Medicine

## 2011-12-07 ENCOUNTER — Encounter: Payer: Self-pay | Admitting: Sports Medicine

## 2011-12-07 ENCOUNTER — Ambulatory Visit (INDEPENDENT_AMBULATORY_CARE_PROVIDER_SITE_OTHER): Payer: BC Managed Care – PPO

## 2011-12-07 VITALS — BP 156/74 | HR 99 | Ht 67.0 in | Wt 213.0 lb

## 2011-12-07 DIAGNOSIS — M25511 Pain in right shoulder: Secondary | ICD-10-CM

## 2011-12-07 DIAGNOSIS — M5030 Other cervical disc degeneration, unspecified cervical region: Secondary | ICD-10-CM

## 2011-12-07 DIAGNOSIS — M25519 Pain in unspecified shoulder: Secondary | ICD-10-CM

## 2011-12-07 MED ORDER — PREDNISONE 50 MG PO TABS: ORAL_TABLET | ORAL | Status: DC

## 2011-12-07 NOTE — Assessment & Plan Note (Addendum)
Only present for about a week now. Most of the symptoms appear to be related to left-sided C6 or C7 cervical radiculitis. She does have a few impingement signs as well , however she notes that this pain is different than the pain she came to see me to discuss. I would like to image her cervical spine, as well as her right shoulder. We will treat her with a burst of prednisone, as well as cervical spine rehabilitation exercises. I will see her back in 4 weeks, and if no better we can consider a diagnostic subacromial injection.

## 2011-12-07 NOTE — Progress Notes (Signed)
Subjective:    I'm seeing this patient as a consultation for:  Dr. Linford Arnold  CC: Right shoulder pain  HPI: Satin comes in with about a week history of pain that she localizes from her right posterior neck running down the posterior aspect of her right shoulder, and down to the posterior lateral aspect of her right elbow. It does not go down past the elbow, going to the fingertips. She describes it as if I nerve was sitting on top of the skin, and has a deep ache. She has no problems with overhead reaching, no problems with sleeping on the right side, and she's not waking up at night due to the pain. She's never had her neck evaluated, but notes this type of sensation has been on and off for a long time. She denies any bowel or bladder incontinence, or lower extremity symptoms.  Past medical history, Surgical history, Family history, Social history, Allergies, and medications have been entered into the medical record, reviewed, and no changes needed.   Review of Systems: No headache, visual changes, nausea, vomiting, diarrhea, constipation, dizziness, abdominal pain, skin rash, fevers, chills, night sweats, weight loss, swollen lymph nodes, body aches, joint swelling, muscle aches, chest pain, or shortness of breath.   Objective:   Vitals:  Afebrile, vital signs stable. General: Well Developed, well nourished, and in no acute distress.  Neuro/Psych: Alert and oriented x3, extra-ocular muscles intact, able to move all 4 extremities.  Skin: Warm and dry, no rashes noted.  Respiratory: Not using accessory muscles, speaking in full sentences, trachea midline.  Cardiovascular: Pulses palpable, no extremity edema. Abdomen: Does not appear distended. Right Shoulder: Inspection reveals no abnormalities, atrophy or asymmetry. Palpation is normal with no tenderness over AC joint or bicipital groove. ROM is full in all planes. Rotator cuff strength normal throughout. Negative Hawkins, and negative empty  can sign. Positive Neer's sign, pain reproduced is not the same pain she came to see me today for. Speeds and Yergason's tests normal. No labral pathology noted with negative Obrien's, negative clunk and good stability. Normal scapular function observed. No painful arc and no drop arm sign. No apprehension sign  Neck: Inspection unremarkable. No palpable stepoffs. Negative Spurling's maneuver. Neck range of motion is somewhat limited to rotation, flexion, extension. Grip strength and sensation normal in bilateral hands Strength good C4 to T1 distribution No sensory change to C4 to T1 Negative Hoffman sign bilaterally Reflexes normal  Impression and Recommendations:   This case required medical decision making of moderate complexity.

## 2012-01-11 ENCOUNTER — Encounter: Payer: Self-pay | Admitting: Sports Medicine

## 2012-01-11 ENCOUNTER — Ambulatory Visit (INDEPENDENT_AMBULATORY_CARE_PROVIDER_SITE_OTHER): Payer: BC Managed Care – PPO | Admitting: Sports Medicine

## 2012-01-11 VITALS — BP 162/86 | HR 66 | Wt 215.0 lb

## 2012-01-11 DIAGNOSIS — Z299 Encounter for prophylactic measures, unspecified: Secondary | ICD-10-CM

## 2012-01-11 DIAGNOSIS — Z23 Encounter for immunization: Secondary | ICD-10-CM

## 2012-01-11 DIAGNOSIS — M5030 Other cervical disc degeneration, unspecified cervical region: Secondary | ICD-10-CM

## 2012-01-11 MED ORDER — MELOXICAM 15 MG PO TABS: ORAL_TABLET | ORAL | Status: DC

## 2012-01-11 NOTE — Progress Notes (Signed)
SPORTS MEDICINE CONSULTATION REPORT  Subjective:    CC: Followup  HPI: I saw Erika Strong approximately 4 weeks ago for pain it was difficult to discern between right-sided rotator cuff versus cervical radiculitis. X-rays showed C4-C5 degenerative disc disease, and shoulder x-rays showed degenerative spurring at the supraspinatus insertion. Her symptoms were predominantly radicular so I put her through prednisone, and cervical spine rehabilitation exercises.  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 Now her pain is somewhat coming back. She continues to localize in the posterior cervical spine worse with flexion, radiating down over the top of her right shoulder in a C4 or C5 distribution.  Past medical history, Surgical history, Family history, Social history, Allergies, and medications have been entered into the medical record, reviewed, and no changes needed.   Review of Systems: No headache, visual changes, nausea, vomiting, diarrhea, constipation, dizziness, abdominal pain, skin rash, fevers, chills, night sweats, weight loss, swollen lymph nodes, body aches, joint swelling, muscle aches, chest pain, or shortness of breath.   Objective:   Vitals:  Afebrile, vital signs stable. General: Well Developed, well nourished, and in no acute distress.  Neuro/Psych: Alert and oriented x3, extra-ocular muscles intact, able to move all 4 extremities.  Skin: Warm and dry, no rashes noted.  Respiratory: Not using accessory muscles, speaking in full sentences, trachea midline.  Cardiovascular: Pulses palpable, no extremity edema. Abdomen: Does not appear distended. Neck: Inspection unremarkable. No palpable stepoffs. Negative Spurling's maneuver. Full neck range of motion Grip strength and sensation normal in bilateral hands Strength good C4 to T1 distribution No sensory change to C4 to T1 Negative Hoffman sign bilaterally Reflexes normal  Impression  and Recommendations:   This case required medical decision making of moderate complexity.

## 2012-01-11 NOTE — Assessment & Plan Note (Signed)
I think that Erika Strong has done extremely well, her pain has improved significantly with the rehabilitation exercises, and prednisone. Unfortunately she has slacked off with the rehabilitation exercises, and some of her pain has come back. She still gets some radiation down the right side of her shoulder. We know based on her x-rays that she has C4-C5 degenerative disc disease. I would like to continue conservative care, she will continue with her home rehabilitation exercises, and I'm going to add Mobic to her regimen. I still would like to see her back in 4 weeks, and if she is no better we will pull the trigger for an MRI of her cervical spine. If not we will continue with conservative treatment.

## 2012-01-28 ENCOUNTER — Encounter: Payer: Self-pay | Admitting: Family Medicine

## 2012-01-28 ENCOUNTER — Ambulatory Visit (INDEPENDENT_AMBULATORY_CARE_PROVIDER_SITE_OTHER): Payer: BC Managed Care – PPO | Admitting: Family Medicine

## 2012-01-28 VITALS — BP 133/70 | HR 94 | Ht 67.0 in | Wt 215.0 lb

## 2012-01-28 DIAGNOSIS — E785 Hyperlipidemia, unspecified: Secondary | ICD-10-CM

## 2012-01-28 DIAGNOSIS — I10 Essential (primary) hypertension: Secondary | ICD-10-CM

## 2012-01-28 MED ORDER — CHLORTHALIDONE 50 MG PO TABS: 50.0000 mg | ORAL_TABLET | Freq: Every day | ORAL | Status: DC

## 2012-01-28 MED ORDER — COLESEVELAM HCL 625 MG PO TABS: 1875.0000 mg | ORAL_TABLET | Freq: Two times a day (BID) | ORAL | Status: DC

## 2012-01-28 NOTE — Progress Notes (Signed)
  Subjective:    Patient ID: Erika Strong, female    DOB: [birth year], 66 y.o.   MRN: [id number]  HPI HTN-  Pt denies chest pain, SOB, dizziness, or heart palpitations.  Taking meds as directed w/o problems.  Denies medication side effects.   Hyperlipidemia - ran out of her welchol and hasn't restarted it. Says she knows she needs to.     Review of Systems     Objective:   Physical Exam  Constitutional: She is oriented to person, place, and time. She appears well-developed and well-nourished.  HENT:  Head: Normocephalic and atraumatic.  Cardiovascular: Normal rate, regular rhythm and normal heart sounds.   Pulmonary/Chest: Effort normal and breath sounds normal.  Neurological: She is alert and oriented to person, place, and time.  Skin: Skin is warm and dry.  Psychiatric: She has a normal mood and affect. Her behavior is normal.          Assessment & Plan:  HTN- Well controlled.  Continue current regimen.  F/u in 3 mo.  Hyperlipidemia - Needs to restart Welchol . New rx sent to the pharmacy.

## 2012-02-08 ENCOUNTER — Ambulatory Visit: Payer: BC Managed Care – PPO | Attending: Gynecologic Oncology | Admitting: Gynecologic Oncology

## 2012-02-08 ENCOUNTER — Encounter: Payer: Self-pay | Admitting: Gynecologic Oncology

## 2012-02-08 VITALS — BP 138/80 | HR 78 | Temp 98.4°F | Resp 16 | Ht 66.58 in | Wt 217.9 lb

## 2012-02-08 DIAGNOSIS — C549 Malignant neoplasm of corpus uteri, unspecified: Secondary | ICD-10-CM | POA: Insufficient documentation

## 2012-02-08 DIAGNOSIS — C541 Malignant neoplasm of endometrium: Secondary | ICD-10-CM

## 2012-02-08 NOTE — Patient Instructions (Signed)
Please follow up with Dr. Marice Potter in 6 months with a pap smear at that time and with the GYN oncology service in 12 months.  Review your survivorship packet with reportable signs and symptoms, recommended preventative screenings per Society of Gynecologic Oncology, and support services offered through the Cancer Center.   Thank you for coming to see me today.  I appreciate your confidence in choosing [facility] for your medical care.  If you have any questions about your visit today, please call our office and we will get back to you as soon as possible.  Warner Mccreedy, NP Gynecologic Oncology

## 2012-02-08 NOTE — Progress Notes (Signed)
Follow Up Note: Gyn-Onc  Erika Strong 66 y.o. female  CC:  Chief Complaint  Patient presents with  . endometrial cancer    Follow up    HPI:  Erika Strong is a 66 year old referred by Dr. Marice Potter for grade 1 endometrioid adenocarcinoma.  On November 24, 2010, she underwent a robotic assisted laparoscopic hysterectomy, bilateral salpingo-oophorectomy, right pelvic lymph node dissection, and left pelvic lymph node sampling.  Final pathology revealed endometrial adenocarcinoma with focal superficial myometrial invasion.  The myometrial invasion was 0.2 cm where the myometrium is 1.4 cm thickness.  There was no lymph vascular space invasion with 20 total lymph nodes sampled negative.    Interval History:  She presents today for continued follow up.  No complaints voiced since her last visit.  She reports intermittent right shoulder pain from repetitive movements at her occupation with relief and pain improvement after physical therapy exercises and the use of Mobic.  Denies nausea, vomiting, abdominal pain, vaginal bleeding, or rectal bleeding.   Review of Systems:  Constitutional: Feels well.  Cardiovascular: No chest pain, shortness of breath, or edema.  Pulmonary: No cough or wheeze.  Gastrointestinal: No nausea, vomiting, or diarrhea. No bright red blood per rectum or change in bowel movement.  Genitourinary: No frequency, urgency, or dysuria. No vaginal bleeding or discharge.  Musculoskeletal: No myalgia.  Intermittent right shoulder pain. Neurologic: No weakness, numbness, or change in gait.  Psychology: No depression, anxiety, or insomnia  Current Meds:  Outpatient Encounter Prescriptions as of 02/08/2012  Medication Sig Dispense Refill  . Ascorbic Acid (VITAMIN C) 1000 MG tablet Take 1,000 mg by mouth daily.        . calcium carbonate (OS-CAL) 600 MG TABS Take 600 mg by mouth 2 (two) times daily with a meal.         . chlorthalidone (HYGROTON) 50 MG tablet Take 1 tablet (50 mg total) by mouth daily.  90 tablet  1  . colesevelam (WELCHOL) 625 MG tablet Take 3 tablets (1,875 mg total) by mouth 2 (two) times daily with a meal.  180 tablet  6  . fish oil-omega-3 fatty acids 1000 MG capsule Take 2 g by mouth 2 (two) times daily.        . Magnesium 250 MG TABS Take 250 mg by mouth daily.        . Multiple Vitamin (MULTIVITAMIN) tablet Take 1 tablet by mouth 2 (two) times daily.        . Nutritional Supplements (ADULT NUTRITIONAL SUPPLEMENT PO) Take by mouth daily. Protandim       . meloxicam (MOBIC) 15 MG tablet One tab PO qAM with breakfast for 2 weeks, then daily prn pain.  30 tablet  3    Allergy:  Allergies  Allergen Reactions  . Atorvastatin     REACTION: myalgias  . Benadryl (Diphenhydramine Hcl)     Causes palpitations  . Lisinopril     REACTION: Cough  . Pravastatin Sodium     REACTION: myalgias  . Simvastatin     REACTION: myalgias    Social Hx:   History   Social History  . Marital Status: Married    Spouse Name: Yehuda Mao    Number of Children: 1   . Years of Education: Assoc degr   Occupational History  . cosmotologist/teacher   .  [employer]   Social History Main Topics  . Smoking status: Never Smoker   . Smokeless tobacco: Never Used  .  Alcohol Use: No  . Drug Use: No  . Sexually Active: No     Comment: cosmetologist, teaches, associate degree, married, regularly exercises.    Other Topics Concern  . Not on file   Social History Narrative   Walks daily for exercise.     Past Surgical Hx:  Past Surgical History  Procedure Date  . Gonadectomy and hysterectomy 11/24/2010    Endometrial cancer, performed by Dr. Laurette Schimke  . Mole removal Nov 2012  . Abdominal hysterectomy 11/24/2010    RTLH, BSO, RPLND, LPLNS    Past Medical Hx:  Past Medical History  Diagnosis Date  . Wears glasses   . Hypertension   . Diverticulitis 2010  . Hyperlipidemia      2007  . Cancer   . Endometrial cancer 11/02/2010  . Malignant neoplasm of corpus uteri, except isthmus 01/21/2011    Family Hx:  Family History  Problem Relation Age of Onset  . Lung cancer Father     smoker  . Cancer Father   . Osteoporosis Mother   . Rheum arthritis Mother   . Cancer Mother     Vitals:  Blood pressure 138/80, pulse 78, temperature 98.4 F (36.9 C), temperature source Oral, resp. rate 16, height 5' 6.58" (1.691 m), weight 217 lb 14.4 oz (98.839 kg).  Physical Exam:  General: Well developed, well nourished female in no acute distress. Alert and oriented x 3.  Neck: Supple without any enlargements.  Lymph node survey: No cervical, supraclavicular, or inguinal adenopathy  Cardiovascular: Regular rate and rhythm. S1 and S2 normal.  Lungs: Clear to auscultation bilaterally. No wheezes/crackles/rhonchi noted.  Skin: No rashes or lesions present. Back: No CVA tenderness  Abdomen: Abdomen soft, non-tender and obese. Active bowel sounds in all quadrants. No evidence of abdominal masses.  No herniations. Genitourinary:    Vulva/vagina: Normal external female genitalia. No lesions.    Urethra: No lesions or masses    Vagina: Atrophic without any lesions. No palpable masses. No vaginal bleeding or drainage noted.    Rectal: Good tone, no masses no cul de sac nodularity.  Extremities: No bilateral cyanosis or clubbing.   Assessment/Plan: Erika Strong is a 66 year old with a stage IA grade 1 endometrial adenocarcinoma. There is no evidence of disease at this time.  She is to follow up with Dr. Marice Potter in 6 months with a pap smear at that time and with the GYN oncology service in 12 months.  Survivorship packet reviewed with reportable signs and symptoms, recommended preventative screenings per Society of Gynecologic Oncology, and support services offered through the Cancer [facility].   CROSS, MELISSA DEAL, NP 02/08/2012, 4:58 PM

## 2012-02-09 ENCOUNTER — Ambulatory Visit (INDEPENDENT_AMBULATORY_CARE_PROVIDER_SITE_OTHER): Payer: BC Managed Care – PPO | Admitting: Sports Medicine

## 2012-02-09 ENCOUNTER — Encounter: Payer: Self-pay | Admitting: Sports Medicine

## 2012-02-09 VITALS — BP 115/75 | HR 67 | Wt 215.0 lb

## 2012-02-09 DIAGNOSIS — M5030 Other cervical disc degeneration, unspecified cervical region: Secondary | ICD-10-CM

## 2012-02-09 NOTE — Assessment & Plan Note (Signed)
Symptoms or 100% resolved after prednisone, exercises, and meloxicam. She will continue the above, if symptoms worsen we can consider MRI for interventional injection planning.

## 2012-02-09 NOTE — Progress Notes (Signed)
SPORTS MEDICINE CONSULTATION REPORT  Subjective:    CC: Followup  HPI: Erika Strong comes back for follow up of her neck pain, she has C4-C5 cervical degenerative disc disease with some upper cervical radiculitis that extended over her right upper shoulder. I placed her on conservative treatment with home exercises, prednisone, and meloxicam.  She has finished all of the above, and still takes occasional meloxicam, she is 100% pain free.  Past medical history, Surgical history, Family history, Social history, Allergies, and medications have been entered into the medical record, reviewed, and no changes needed.   Review of Systems: No headache, visual changes, nausea, vomiting, diarrhea, constipation, dizziness, abdominal pain, skin rash, fevers, chills, night sweats, weight loss, swollen lymph nodes, body aches, joint swelling, muscle aches, chest pain, shortness of breath, mood changes, visual or auditory hallucinations.   Objective:   Vitals:  Afebrile, vital signs stable. General: Well Developed, well nourished, and in no acute distress.  Neuro/Psych: Alert and oriented x3, extra-ocular muscles intact, able to move all 4 extremities.  Skin: Warm and dry, no rashes noted.  Respiratory: Not using accessory muscles, speaking in full sentences, trachea midline.  Cardiovascular: Pulses palpable, no extremity edema. Abdomen: Does not appear distended. Neck: Inspection unremarkable. No palpable stepoffs. Negative Spurling's maneuver. Full neck range of motion Grip strength and sensation normal in bilateral hands Strength good C4 to T1 distribution No sensory change to C4 to T1 Negative Hoffman sign bilaterally Reflexes normal  Impression and Recommendations:   This case required medical decision making of moderate complexity.

## 2012-02-14 ENCOUNTER — Other Ambulatory Visit: Payer: Self-pay

## 2012-06-21 ENCOUNTER — Other Ambulatory Visit: Payer: Self-pay | Admitting: Family Medicine

## 2012-06-21 DIAGNOSIS — Z1231 Encounter for screening mammogram for malignant neoplasm of breast: Secondary | ICD-10-CM

## 2012-07-11 ENCOUNTER — Encounter: Payer: Self-pay | Admitting: Obstetrics & Gynecology

## 2012-07-11 ENCOUNTER — Ambulatory Visit: Payer: BC Managed Care – PPO | Admitting: Obstetrics & Gynecology

## 2012-07-11 ENCOUNTER — Ambulatory Visit (INDEPENDENT_AMBULATORY_CARE_PROVIDER_SITE_OTHER): Payer: BC Managed Care – PPO

## 2012-07-11 ENCOUNTER — Ambulatory Visit (INDEPENDENT_AMBULATORY_CARE_PROVIDER_SITE_OTHER): Payer: BC Managed Care – PPO | Admitting: Obstetrics & Gynecology

## 2012-07-11 VITALS — BP 151/81 | HR 92 | Resp 17 | Ht 68.0 in | Wt 218.0 lb

## 2012-07-11 DIAGNOSIS — Z1151 Encounter for screening for human papillomavirus (HPV): Secondary | ICD-10-CM

## 2012-07-11 DIAGNOSIS — Z01419 Encounter for gynecological examination (general) (routine) without abnormal findings: Secondary | ICD-10-CM

## 2012-07-11 DIAGNOSIS — Z1231 Encounter for screening mammogram for malignant neoplasm of breast: Secondary | ICD-10-CM

## 2012-07-11 DIAGNOSIS — Z124 Encounter for screening for malignant neoplasm of cervix: Secondary | ICD-10-CM

## 2012-07-11 DIAGNOSIS — Z0000 Encounter for general adult medical examination without abnormal findings: Secondary | ICD-10-CM

## 2012-07-11 NOTE — Progress Notes (Signed)
Subjective:    Erika Strong is a 67 y.o. female who presents for an annual exam. The patient has no complaints today. The patient is not currently sexually active. GYN screening history: last pap: was normal. The patient wears seatbelts: yes. The patient participates in regular exercise: yes. Has the patient ever been transfused or tattooed?: no. The patient reports that there is not domestic violence in her life.   Menstrual History: OB History   Grav Para Term Preterm Abortions TAB SAB Ect Mult Living   1 1 1       1       Menarche age: 72  No LMP recorded. Patient has had a hysterectomy.    The following portions of the patient's history were reviewed and updated as appropriate: allergies, current medications, past family history, past medical history, past social history, past surgical history and problem list.  Review of Systems A comprehensive review of systems was negative. 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   Objective:    BP 151/81  Pulse 92  Resp 17  Ht 5\' 8"  (1.727 m)  Wt 218 lb (98.884 kg)  BMI 33.15 kg/m2  General Appearance:    Alert, cooperative, no distress, appears stated age  Head:    Normocephalic, without obvious abnormality, atraumatic  Eyes:    PERRL, conjunctiva/corneas clear, EOM's intact, fundi    benign, both eyes  Ears:    Normal TM's and external ear canals, both ears  Nose:   Nares normal, septum midline, mucosa normal, no drainage    or sinus tenderness  Throat:   Lips, mucosa, and tongue normal; teeth and gums normal  Neck:   Supple, symmetrical, trachea midline, no adenopathy;    thyroid:  no enlargement/tenderness/nodules; no carotid   bruit or JVD  Back:     Symmetric, no curvature, ROM normal, no CVA tenderness  Lungs:     Clear to auscultation bilaterally, respirations unlabored  Chest Wall:    No tenderness or deformity   Heart:    Regular rate and rhythm, S1 and S2 normal, no murmur, rub   or gallop  Breast  Exam:    No tenderness, masses, or nipple abnormality  Abdomen:     Soft, non-tender, bowel sounds active all four quadrants,    no masses, no organomegaly, centripital obesity  Genitalia:    Normal female without lesion, discharge or tenderness, moderate vulvovaginal atrophy, normal cuff, no masses with bimanual exam     Extremities:   Extremities normal, atraumatic, no cyanosis or edema  Pulses:   2+ and symmetric all extremities  Skin:   Skin color, texture, turgor normal, no rashes or lesions  Lymph nodes:   Cervical, supraclavicular, and axillary nodes normal  Neurologic:   CNII-XII intact, normal strength, sensation and reflexes    throughout  .    Assessment:    Healthy female exam.    Plan:     Thin prep Pap smear.  She will have an exam with Dr. Nelly Rout in 6 months.

## 2012-07-31 ENCOUNTER — Ambulatory Visit (INDEPENDENT_AMBULATORY_CARE_PROVIDER_SITE_OTHER): Payer: BC Managed Care – PPO | Admitting: Family Medicine

## 2012-07-31 ENCOUNTER — Encounter: Payer: Self-pay | Admitting: Family Medicine

## 2012-07-31 VITALS — BP 154/85 | HR 73 | Wt 214.0 lb

## 2012-07-31 DIAGNOSIS — M5412 Radiculopathy, cervical region: Secondary | ICD-10-CM

## 2012-07-31 MED ORDER — PREDNISONE 20 MG PO TABS: ORAL_TABLET | ORAL | Status: AC

## 2012-07-31 MED ORDER — IBUPROFEN 800 MG PO TABS: 800.0000 mg | ORAL_TABLET | Freq: Three times a day (TID) | ORAL | Status: DC | PRN

## 2012-07-31 NOTE — Progress Notes (Signed)
CC: Erika Strong is a 67 y.o. female is here for Back Pain   Subjective: HPI:  Patient complains of right neck pain. This has been present for 1-1/2 weeks, severity fluctuates from mild to absent. It is worse the more she uses her right upper extremity pain typically comes on hours after use of extremity.  It is described only as a pain and soreness. It radiates into the right shoulder down the back of the right arm.  It came on after she was spending hours opening her poor for the summer. Pain is slightly improved with meloxicam, pain is completely relieved with ibuprofen she has not been taking the 2 at the same time.  She denies motor or sensory disturbances other than above. Denies skin changes, fever, chills, right upper extremities swelling redness nor warmth. She denies recent or remote trauma to the right shoulder   Review Of Systems Outlined In HPI  Past Medical History  Diagnosis Date  . Wears glasses   . Hypertension   . Diverticulitis 2010  . Hyperlipidemia     2007  . Cancer   . Endometrial cancer 11/02/2010  . Malignant neoplasm of corpus uteri, except isthmus 01/21/2011     Family History  Problem Relation Age of Onset  . Lung cancer Father     smoker  . Cancer Father   . Osteoporosis Mother   . Rheum arthritis Mother   . Cancer Mother      History  Substance Use Topics  . Smoking status: Never Smoker   . Smokeless tobacco: Never Used  . Alcohol Use: No     Objective: Filed Vitals:   07/31/12 0917  BP: 154/85  Pulse: 73    General: Alert and Oriented, No Acute Distress HEENT: Pupils equal, round, reactive to light. Conjunctivae clear.  Moist mucous membranes Lungs: Clear to auscultation bilaterally, no wheezing/ronchi/rales.  Comfortable work of breathing. Good air movement. Extremities: No peripheral edema.  Strong peripheral pulses. Right upper extremity full range of motion strength. C7-C6 DTR two over four and symmetric to the left upper  extremity. Hawkins negative, Neer's negative, speed's negative, empty can test negative, cross arm test negative, no pain with palpation of right a.c. Joint.   Neck: No midline cervical tenderness, no paraspinal muscular tenderness in the cervical region/. Negative Spurling's. Full range of motion and strength without reproduction of pain Mental Status: No depression, anxiety, nor agitation. Skin: Warm and dry.  Assessment & Plan: Erika Strong was seen today for back pain.  Diagnoses and associated orders for this visit:  Cervical radiculitis - predniSONE (DELTASONE) 20 MG tablet; Three tabs at once daily for five days. - ibuprofen (ADVIL,MOTRIN) 800 MG tablet; Take 1 tablet (800 mg total) by mouth every 8 (eight) hours as needed for pain.    Patient states character of pain identical to that when she was being treated for cervical radiculitis, symptoms are suspicious of a flare. We'll treat with prednisone burst, encouraged restart range of motion exercises at home, return on Thursday with Dr. Karie Strong. referral if no improvement  Return if symptoms worsen or fail to improve.

## 2012-08-02 ENCOUNTER — Other Ambulatory Visit: Payer: Self-pay | Admitting: Family Medicine

## 2012-08-04 ENCOUNTER — Telehealth: Payer: Self-pay | Admitting: *Deleted

## 2012-08-04 ENCOUNTER — Encounter: Payer: Self-pay | Admitting: Sports Medicine

## 2012-08-04 ENCOUNTER — Ambulatory Visit (INDEPENDENT_AMBULATORY_CARE_PROVIDER_SITE_OTHER): Payer: BC Managed Care – PPO | Admitting: Sports Medicine

## 2012-08-04 VITALS — BP 154/81 | HR 58

## 2012-08-04 DIAGNOSIS — M6530 Trigger finger, unspecified finger: Secondary | ICD-10-CM

## 2012-08-04 DIAGNOSIS — M5030 Other cervical disc degeneration, unspecified cervical region: Secondary | ICD-10-CM

## 2012-08-04 MED ORDER — TRAMADOL HCL 50 MG PO TABS: 50.0000 mg | ORAL_TABLET | Freq: Three times a day (TID) | ORAL | Status: DC | PRN

## 2012-08-04 NOTE — Assessment & Plan Note (Signed)
Was doing extremely well but unfortunately has been doing more yard work. Now having a flare for radiculitis, this time and right-sided C7 distribution. On her last day of prednisone and her symptoms are much improved, she will work more aggressively on the exercises, but add tramadol for breakthrough pain. She has been switched to ibuprofen which is appropriate. I would like to obtain an MRI for interventional injection planning, and we'll see her back to go over the results.

## 2012-08-04 NOTE — Telephone Encounter (Signed)
Prior auth obtained for MRI c-spine WO cm.  Auth number is [id number].  Imaging notified.

## 2012-08-04 NOTE — Progress Notes (Signed)
  Subjective:    CC: Followup  HPI: It seemed to previously for a right-sided cervical radiculitis, C5. I placed her to conservative measures including steroids, exercises, NSAIDs, and she resolved her pain. Unfortunately she has been working very hard in the yard, and has recurrence of pain she localizes in her neck, radiating down the posterior aspect of her right arm, into the dorsum of her hand. She denies any bowel or bladder dysfunction or constitutional symptoms. She was seen by Dr. Ivan Anchors recently, prescribed prednisone, she is on her last day and feels significantly better. Pain is radiating as above, continues to improve on a daily basis, is mild to moderate.  Past medical history, Surgical history, Family history not pertinant except as noted below, Social history, Allergies, and medications have been entered into the medical record, reviewed, and no changes needed.   Review of Systems: No fevers, chills, night sweats, weight loss, chest pain, or shortness of breath.   Objective:    General: Well Developed, well nourished, and in no acute distress.  Neuro: Alert and oriented x3, extra-ocular muscles intact, sensation grossly intact.  HEENT: Normocephalic, atraumatic, pupils equal round reactive to light, neck supple, no masses, no lymphadenopathy, thyroid nonpalpable.  Skin: Warm and dry, no rashes. Cardiac: Regular rate and rhythm, no murmurs rubs or gallops, no lower extremity edema.  Respiratory: Clear to auscultation bilaterally. Not using accessory muscles, speaking in full sentences. Neck: Inspection unremarkable. No palpable stepoffs. Negative Spurling's maneuver. Full neck range of motion Grip strength and sensation normal in bilateral hands Strength good C4 to T1 distribution No sensory change to C4 to T1 Negative Hoffman sign bilaterally Reflexes normal Impression and Recommendations:

## 2012-08-08 ENCOUNTER — Ambulatory Visit (INDEPENDENT_AMBULATORY_CARE_PROVIDER_SITE_OTHER): Payer: BC Managed Care – PPO

## 2012-08-08 DIAGNOSIS — M5030 Other cervical disc degeneration, unspecified cervical region: Secondary | ICD-10-CM

## 2012-08-08 DIAGNOSIS — M47812 Spondylosis without myelopathy or radiculopathy, cervical region: Secondary | ICD-10-CM

## 2012-08-08 DIAGNOSIS — M5380 Other specified dorsopathies, site unspecified: Secondary | ICD-10-CM

## 2012-08-08 DIAGNOSIS — M5090 Cervical disc disorder, unspecified, unspecified cervical region: Secondary | ICD-10-CM

## 2012-08-11 ENCOUNTER — Ambulatory Visit (INDEPENDENT_AMBULATORY_CARE_PROVIDER_SITE_OTHER): Payer: BC Managed Care – PPO | Admitting: Sports Medicine

## 2012-08-11 ENCOUNTER — Encounter: Payer: Self-pay | Admitting: Sports Medicine

## 2012-08-11 VITALS — BP 138/75 | HR 64 | Wt 215.0 lb

## 2012-08-11 DIAGNOSIS — M6530 Trigger finger, unspecified finger: Secondary | ICD-10-CM

## 2012-08-11 DIAGNOSIS — M5030 Other cervical disc degeneration, unspecified cervical region: Secondary | ICD-10-CM

## 2012-08-11 MED ORDER — MELOXICAM 15 MG PO TABS: 15.0000 mg | ORAL_TABLET | Freq: Every day | ORAL | Status: DC

## 2012-08-11 NOTE — Assessment & Plan Note (Signed)
MRI shows multilevel disc protrusions, worse at the C5-C6 level predominately caused by a right-sided disc protrusion as well as facet spondylosis. There are also smaller changes of the C6-C7 and C7-T1 levels. Overall symptoms are fairly well controlled with meloxicam at bedtime, and an occasional tramadol. We discussed some ergonomic changes that could improve her symptoms. I am of course not going to make any major medication changes today. Should the symptoms continue to bother her, or get to the point where she would like to proceed further, the next step would be a C6-C7 right-sided epidural injection, ideally transforaminal.

## 2012-08-11 NOTE — Assessment & Plan Note (Signed)
Right ring finger with triggering. It is mild and nonpainful. Should worsen, certainly I can perform a guided tendon sheath injection.

## 2012-08-11 NOTE — Progress Notes (Signed)
  Subjective:    CC: Follow up  HPI: This pleasant 67 year old female returns to followup her right cervical radiculitis. Overall she is doing very well, she is now noted that when she takes Mobic at night, she has no pain. I did obtain an MRI, results of which we dictated below. Pain is only mild, radiates down the right arm in the C8 distribution, continues to improve.  Trigger finger:  Right ring finger, mild, persistent, no pain today.  Past medical history, Surgical history, Family history not pertinant except as noted below, Social history, Allergies, and medications have been entered into the medical record, reviewed, and no changes needed.   Review of Systems: No fevers, chills, night sweats, weight loss, chest pain, or shortness of breath.   Objective:    General: Well Developed, well nourished, and in no acute distress.  Neuro: Alert and oriented x3, extra-ocular muscles intact, sensation grossly intact.  HEENT: Normocephalic, atraumatic, pupils equal round reactive to light, neck supple, no masses, no lymphadenopathy, thyroid nonpalpable.  Skin: Warm and dry, no rashes. Cardiac: Regular rate and rhythm, no murmurs rubs or gallops, no lower extremity edema.  Respiratory: Clear to auscultation bilaterally. Not using accessory muscles, speaking in full sentences. Neck: Inspection unremarkable. No palpable stepoffs. Negative Spurling's maneuver. Full neck range of motion Grip strength and sensation normal in bilateral hands Strength good C4 to T1 distribution No sensory change to C4 to T1 Negative Hoffman sign bilaterally Reflexes normal   MRI shows multilevel degenerative disc disease with the worst at the C5-C6 level with right-sided facet spondylosis as well as a disc protrusion. There is also lesser so degenerative disc disease at the C6-C7 and C7-T1 levels.  Impression and Recommendations:

## 2012-09-22 ENCOUNTER — Encounter: Payer: Self-pay | Admitting: Family Medicine

## 2012-09-22 ENCOUNTER — Ambulatory Visit (INDEPENDENT_AMBULATORY_CARE_PROVIDER_SITE_OTHER): Payer: BC Managed Care – PPO | Admitting: Family Medicine

## 2012-09-22 VITALS — BP 121/77 | HR 74 | Ht 67.0 in | Wt 213.0 lb

## 2012-09-22 DIAGNOSIS — Z0000 Encounter for general adult medical examination without abnormal findings: Secondary | ICD-10-CM

## 2012-09-22 DIAGNOSIS — E785 Hyperlipidemia, unspecified: Secondary | ICD-10-CM

## 2012-09-22 DIAGNOSIS — Z23 Encounter for immunization: Secondary | ICD-10-CM

## 2012-09-22 DIAGNOSIS — I10 Essential (primary) hypertension: Secondary | ICD-10-CM

## 2012-09-22 LAB — COMPLETE METABOLIC PANEL WITH GFR
Alkaline Phosphatase: 54 U/L (ref 39–117)
BUN: 11 mg/dL (ref 6–23)
GFR, Est Non African American: 65 mL/min
Glucose, Bld: 93 mg/dL (ref 70–99)
Sodium: 143 mEq/L (ref 135–145)
Total Bilirubin: 0.8 mg/dL (ref 0.3–1.2)
Total Protein: 7 g/dL (ref 6.0–8.3)

## 2012-09-22 LAB — LIPID PANEL
HDL: 58 mg/dL (ref >39)
Total CHOL/HDL Ratio: 3.9 Ratio

## 2012-09-22 LAB — HEMOGLOBIN A1C: Mean Plasma Glucose: 108 mg/dL (ref <117)

## 2012-09-22 NOTE — Progress Notes (Signed)
Subjective:    Patient ID: Erika Strong, female    DOB: 1945-08-09, 67 y.o.   MRN: [id number]  HPI Here for CPE today. No living will in place.  Last eye exam was in November 2013, with Dr. Jackquline Bosch.  Still working full time.     Review of Systems Comprehensive ROS is negative.    BP 121/77  Pulse 74  Ht 5\' 7"  (1.702 m)  Wt 213 lb (96.616 kg)  BMI 33.35 kg/m2    Allergies  Allergen Reactions  . Atorvastatin     REACTION: myalgias  . Benadryl (Diphenhydramine Hcl)     Causes palpitations  . Lisinopril     REACTION: Cough  . Pravastatin Sodium     REACTION: myalgias  . Simvastatin     REACTION: myalgias    Past Medical History  Diagnosis Date  . Wears glasses   . Hypertension   . Diverticulitis 2010  . Hyperlipidemia     2007  . Cancer   . Endometrial cancer 11/02/2010  . Malignant neoplasm of corpus uteri, except isthmus 01/21/2011    Past Surgical History  Procedure Laterality Date  . Gonadectomy and hysterectomy  11/24/2010    Endometrial cancer, performed by Dr. Laurette Schimke  . Mole removal  Nov 2012  . Abdominal hysterectomy  11/24/2010    RTLH, BSO, RPLND, LPLNS    History   Social History  . Marital Status: Married    Spouse Name: Yehuda Mao    Number of Children: 1   . Years of Education: Assoc degr   Occupational History  . cosmotologist/teacher   .  [employer]   Social History Main Topics  . Smoking status: Never Smoker   . Smokeless tobacco: Never Used  . Alcohol Use: No  . Drug Use: No  . Sexually Active: No     Comment: cosmetologist, teaches, associate degree, married, regularly exercises.    Other Topics Concern  . Not on file   Social History Narrative   Walks daily for exercise.     Family History  Problem Relation Age of Onset  . Lung cancer Father     smoker  . Cancer Father   . Osteoporosis Mother   . Rheum arthritis Mother   . Cancer Mother     Outpatient Encounter Prescriptions as of 09/22/2012   Medication Sig Dispense Refill  . Ascorbic Acid (VITAMIN C) 1000 MG tablet Take 1,000 mg by mouth daily.        . calcium carbonate (OS-CAL) 600 MG TABS Take 600 mg by mouth 2 (two) times daily with a meal.        . chlorthalidone (HYGROTON) 50 MG tablet TAKE 1 TABLET BY MOUTH DAILY  90 tablet  0  . fish oil-omega-3 fatty acids 1000 MG capsule Take 2 g by mouth 2 (two) times daily.        Marland Kitchen ibuprofen (ADVIL,MOTRIN) 800 MG tablet Take 1 tablet (800 mg total) by mouth every 8 (eight) hours as needed for pain.  30 tablet  0  . Magnesium 250 MG TABS Take 250 mg by mouth daily.        . meloxicam (MOBIC) 15 MG tablet Take 1 tablet (15 mg total) by mouth daily.  90 tablet  3  . Multiple Vitamin (MULTIVITAMIN) tablet Take 1 tablet by mouth 2 (two) times daily.        . Nutritional Supplements (ADULT NUTRITIONAL SUPPLEMENT PO) Take by mouth daily. Protandim       .  traMADol (ULTRAM) 50 MG tablet Take 1 tablet (50 mg total) by mouth every 8 (eight) hours as needed for pain.  50 tablet  2   No facility-administered encounter medications on file as of 09/22/2012.          Objective:   Physical Exam  Constitutional: She is oriented to person, place, and time. She appears well-developed and well-nourished.  HENT:  Head: Normocephalic and atraumatic.  Right Ear: External ear normal.  Left Ear: External ear normal.  Nose: Nose normal.  Mouth/Throat: Oropharynx is clear and moist.  TMs and canals are clear.   Eyes: Conjunctivae and EOM are normal. Pupils are equal, round, and reactive to light.  Neck: Neck supple. No thyromegaly present.  Cardiovascular: Normal rate, regular rhythm and normal heart sounds.   No carotid bruits.   Pulmonary/Chest: Effort normal and breath sounds normal. She has no wheezes.  Normal breast exam   Abdominal: Soft. Bowel sounds are normal. She exhibits no distension and no mass. There is no tenderness. There is no rebound and no guarding.  Musculoskeletal: She  exhibits no edema.  Lymphadenopathy:    She has no cervical adenopathy.  Neurological: She is alert and oriented to person, place, and time.  Skin: Skin is warm and dry.  Psychiatric: She has a normal mood and affect. Her behavior is normal.          Assessment & Plan:  CPE Keep up a regular exercise program and make sure you are eating a healthy diet Try to eat 4 servings of dairy a day, or if you are lactose intolerant take a calcium with vitamin D daily.  Your vaccines are up to date.   Due for pneumonia vaccine Due for labs.   HTN- well controlled. Check BMP. F/u in 6 months.   Hyperlipidemia - Check lipids and liver enzymes.

## 2012-09-22 NOTE — Patient Instructions (Signed)
Keep up a regular exercise program and make sure you are eating a healthy diet Try to eat 4 servings of dairy a day, or if you are lactose intolerant take a calcium with vitamin D daily.  Your vaccines are up to date.   

## 2012-09-22 NOTE — Addendum Note (Signed)
Addended by: Avon Gully C on: 09/22/2012 10:40 AM   Modules accepted: Orders

## 2012-10-20 ENCOUNTER — Ambulatory Visit: Payer: BC Managed Care – PPO | Admitting: Sports Medicine

## 2012-11-01 ENCOUNTER — Other Ambulatory Visit: Payer: Self-pay | Admitting: Family Medicine

## 2012-12-07 ENCOUNTER — Ambulatory Visit (INDEPENDENT_AMBULATORY_CARE_PROVIDER_SITE_OTHER): Payer: BC Managed Care – PPO | Admitting: Sports Medicine

## 2012-12-07 DIAGNOSIS — Z23 Encounter for immunization: Secondary | ICD-10-CM | POA: Insufficient documentation

## 2012-12-07 NOTE — Progress Notes (Signed)
Patient ID: Erika Strong, female   DOB: Feb 16, 1946, 67 y.o.   MRN: [id number] I was present for all essential parts of this visit and procedure. Ihor Austin. Benjamin Stain, M.D.

## 2012-12-07 NOTE — Assessment & Plan Note (Signed)
Flu shot given, return in one year for this.

## 2013-01-16 ENCOUNTER — Ambulatory Visit: Payer: BC Managed Care – PPO | Admitting: Family Medicine

## 2013-01-16 ENCOUNTER — Encounter: Payer: Self-pay | Admitting: Family Medicine

## 2013-01-16 ENCOUNTER — Ambulatory Visit (INDEPENDENT_AMBULATORY_CARE_PROVIDER_SITE_OTHER): Payer: BC Managed Care – PPO | Admitting: Family Medicine

## 2013-01-16 VITALS — BP 147/83 | HR 69 | Wt 219.0 lb

## 2013-01-16 DIAGNOSIS — M5412 Radiculopathy, cervical region: Secondary | ICD-10-CM

## 2013-01-16 MED ORDER — PREDNISONE 20 MG PO TABS: ORAL_TABLET | ORAL | Status: AC

## 2013-01-16 MED ORDER — IBUPROFEN 800 MG PO TABS: 800.0000 mg | ORAL_TABLET | Freq: Three times a day (TID) | ORAL | Status: DC | PRN

## 2013-01-16 NOTE — Progress Notes (Signed)
CC: Erika Strong is a 67 y.o. female is here for Back Pain   Subjective: HPI:  Right shoulder pain that has been present since Saturday came on gradually now moderate in severity worsening on a daily basis localized posterior shoulder near the right shoulder blade radiating down back of right proximal arm. Nothing particularly makes pain worse pain is improved with ibuprofen 800 mg 3 times a day. Pain had been absent for the past 2-3 months, pain is identical to that which she was seen for with Dr. Karie Schwalbe. and myself back in the summer. Denies trauma but admits to overexerting herself with painting and carrying heavy objects over the weekend. Denies weakness, or motor or sensory disturbances other than that described above in any extremity. Denies midline neck pain or headaches. Denies swelling redness or warmth of any joints in the right upper extremity   Review Of Systems Outlined In HPI  Past Medical History  Diagnosis Date  . Wears glasses   . Hypertension   . Diverticulitis 2010  . Hyperlipidemia     2007  . Cancer   . Endometrial cancer 11/02/2010  . Malignant neoplasm of corpus uteri, except isthmus 01/21/2011     Family History  Problem Relation Age of Onset  . Lung cancer Father     smoker  . Cancer Father   . Osteoporosis Mother   . Rheum arthritis Mother   . Cancer Mother      History  Substance Use Topics  . Smoking status: Never Smoker   . Smokeless tobacco: Never Used  . Alcohol Use: No     Objective: Filed Vitals:   01/16/13 1006  BP: 147/83  Pulse: 69    Vital signs reviewed. General: Alert and Oriented, No Acute Distress HEENT: Pupils equal, round, reactive to light. Conjunctivae clear.  External ears unremarkable.  Moist mucous membranes. Lungs: Clear and comfortable work of breathing, speaking in full sentences without accessory muscle use. Cardiac: Regular rate and rhythm.  Neuro: CN II-XII grossly intact, gait normal. Extremities: No peripheral  edema.  Strong peripheral pulses. Full range of motion and strength of the right upper extremity was negative Hawkins, negative empty can, negative Neer, negative O'Brien, negative speeds. She has full range of motion strength of the right rotator cuff group. Mental Status: No depression, anxiety, nor agitation. Logical though process. Skin: Warm and dry.  Assessment & Plan: Erika Strong was seen today for back pain.  Diagnoses and associated orders for this visit:  Right cervical radiculopathy - predniSONE (DELTASONE) 20 MG tablet; Three tabs at once daily for five days. - ibuprofen (ADVIL,MOTRIN) 800 MG tablet; Take 1 tablet (800 mg total) by mouth every 8 (eight) hours as needed.    Return of right cervical radiculopathy therefore prednisone burst and as needed ibuprofen continue former physical therapy at home return next week if not improved for consideration of C-spine steroid injections  Return if symptoms worsen or fail to improve.

## 2013-02-01 ENCOUNTER — Other Ambulatory Visit: Payer: Self-pay | Admitting: Family Medicine

## 2013-02-06 ENCOUNTER — Encounter: Payer: Self-pay | Admitting: Family Medicine

## 2013-02-06 ENCOUNTER — Ambulatory Visit (INDEPENDENT_AMBULATORY_CARE_PROVIDER_SITE_OTHER): Payer: BC Managed Care – PPO | Admitting: Family Medicine

## 2013-02-06 VITALS — BP 137/74 | HR 84 | Temp 97.6°F | Ht 67.0 in | Wt 221.0 lb

## 2013-02-06 DIAGNOSIS — L989 Disorder of the skin and subcutaneous tissue, unspecified: Secondary | ICD-10-CM

## 2013-02-06 NOTE — Progress Notes (Signed)
   Subjective:    Patient ID: Erika Strong, female    DOB: [birth year], 67 y.o.   MRN: [id number]  HPI  hit leg 2 weeks ago, and noticed knot, not in any pain. Now has a knot. Says not red or hot. No fever, etc.  Knot not going away.  No bruising.  No worsening or alleviating factors.   Review of Systems     Objective:   Physical Exam  Musculoskeletal:       Legs:         Assessment & Plan:  Nodule, left leg over shin.  - Recommend referral for excision. Unknown 100% convinced that this is a lipoma. Feels like has a little bit of irregularity to me. She was worried that it could be related to her history of endometrial cancer but I think this is very unlikely. If the lesion starts to resolve while she is waiting to get her appointment with dermatology then she can always cancel it. If she starts to notice any redness or heat or fever in the area then please let me know.

## 2013-02-08 ENCOUNTER — Ambulatory Visit: Payer: BC Managed Care – PPO | Attending: Gynecologic Oncology | Admitting: Gynecologic Oncology

## 2013-02-08 ENCOUNTER — Encounter: Payer: Self-pay | Admitting: Gynecologic Oncology

## 2013-02-08 VITALS — BP 143/68 | HR 78 | Temp 98.6°F | Resp 20 | Ht 66.58 in | Wt 220.0 lb

## 2013-02-08 DIAGNOSIS — C541 Malignant neoplasm of endometrium: Secondary | ICD-10-CM

## 2013-02-08 NOTE — Patient Instructions (Addendum)
Doing great!  Plan to follow up with Dr. Marice Potter in May 2015 and with GYN Oncology in one year or sooner if needed.  Please call after your appointment with Dr. Marice Potter to schedule your appointment for Dec. 2015.  Happy Holidays!

## 2013-02-09 NOTE — Progress Notes (Signed)
Follow Up Note: Gyn-Onc  Erika Strong 67 y.o. female  CC:  Chief Complaint  Patient presents with  . Endometrial cancer    Follow up    HPI:  Erika Strong is a 67 year old referred by Dr. Marice Potter for grade 1 endometrioid adenocarcinoma.  On November 24, 2010, she underwent a robotic assisted laparoscopic hysterectomy, bilateral salpingo-oophorectomy, right pelvic lymph node dissection, and left pelvic lymph node sampling.  Final pathology revealed endometrial adenocarcinoma with focal superficial myometrial invasion.  The myometrial invasion was 0.2 cm where the myometrium is 1.4 cm thickness.  There was no lymph vascular space invasion with 20 total lymph nodes sampled negative.    Interval History:  She presents today for continued follow up.  No complaints voiced since her last visit.  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  She is looking forward to retirement and the opportunities it will bring for her.  She recently hit her left lower extremity on a ladder when painting after Thanksgiving and has developed a knot in that area.  Painful with moderate palpation.  No bleeding, drainage, bruising, or increased warmth reported.  She states she saw Dr. Linford Arnold, who set her up with a dermatologist who she sees next week.  She reports intermittent right shoulder pain after painting that is resolving.  She is up to date with her mammogram and colonoscopy and states Dr. Linford Arnold performs her annual breast examination.  She states she has been working on her cholesterol since it was slightly elevated and is pleased with her Hgb A1C.  Denies nausea, vomiting, abdominal pain, vaginal bleeding, or rectal bleeding.  No concerns voiced.   Review of Systems:  Constitutional: Feels well.  No fever, chills, early satiety, unintentional change in weight, change in appetite.  No visual or hearing changes.  No difficulty swallowing.    Cardiovascular: No chest pain, shortness of breath, or edema.  Pulmonary: No cough or wheeze.  Gastrointestinal: No nausea, vomiting, or diarrhea. No bright red blood per rectum or change in bowel movement.  Genitourinary: No frequency, urgency, or dysuria. No vaginal bleeding or discharge.  Musculoskeletal: No myalgia.  Intermittent right shoulder pain. Neurologic: No weakness, numbness, or change in gait.  Psychology: No depression, anxiety, or insomnia.  Current Meds:  Outpatient Encounter Prescriptions as of 02/08/2013  Medication Sig  . Ascorbic Acid (VITAMIN C) 1000 MG tablet Take 1,000 mg by mouth daily.    . calcium carbonate (OS-CAL) 600 MG TABS Take 600 mg by mouth 2 (two) times daily with a meal.    . chlorthalidone (HYGROTON) 50 MG tablet TAKE 1 TABLET BY MOUTH DAILY  . fish oil-omega-3 fatty acids 1000 MG capsule Take 2 g by mouth 2 (two) times daily.    Marland Kitchen ibuprofen (ADVIL,MOTRIN) 800 MG tablet Take 1 tablet (800 mg total) by mouth every 8 (eight) hours as needed.  . Magnesium 250 MG TABS Take 250 mg by mouth daily.    . meloxicam (MOBIC) 15 MG tablet Take 1 tablet (15 mg total) by mouth daily.  . Multiple Vitamin (MULTIVITAMIN) tablet Take 1 tablet by mouth 2 (two) times daily.    . Nutritional Supplements (ADULT NUTRITIONAL SUPPLEMENT PO) Take by mouth daily. Protandim     Allergy:  Allergies  Allergen Reactions  . Atorvastatin     REACTION: myalgias  . Benadryl [Diphenhydramine Hcl]     Causes palpitations  . Lisinopril  REACTION: Cough  . Pravastatin Sodium     REACTION: myalgias  . Simvastatin     REACTION: myalgias    Social Hx:   History   Social History  . Marital Status: Married    Spouse Name: Yehuda Mao    Number of Children: 1   . Years of Education: Assoc degr   Occupational History  . cosmotologist/teacher   .  [employer]   Social History Main Topics  . Smoking status: Never Smoker   . Smokeless tobacco: Never Used  .  Alcohol Use: No  . Drug Use: No  . Sexual Activity: No     Comment: cosmetologist, teaches, associate degree, married, regularly exercises.    Other Topics Concern  . Not on file   Social History Narrative   Walks daily for exercise.     Past Surgical Hx:  Past Surgical History  Procedure Laterality Date  . Gonadectomy and hysterectomy  11/24/2010    Endometrial cancer, performed by Dr. Laurette Schimke  . Mole removal  Nov 2012  . Abdominal hysterectomy  11/24/2010    RTLH, BSO, RPLND, LPLNS    Past Medical Hx:  Past Medical History  Diagnosis Date  . Wears glasses   . Hypertension   . Diverticulitis 2010  . Hyperlipidemia     2007  . Cancer   . Endometrial cancer 11/02/2010  . Malignant neoplasm of corpus uteri, except isthmus 01/21/2011    Family Hx:  Family History  Problem Relation Age of Onset  . Lung cancer Father     smoker  . Cancer Father   . Osteoporosis Mother   . Rheum arthritis Mother   . Cancer Mother     Vitals:  Blood pressure 143/68, pulse 78, temperature 98.6 F (37 C), temperature source Oral, resp. rate 20, height 5' 6.58" (1.691 m), weight 220 lb (99.791 kg).  Physical Exam:  General: Well developed, well nourished female in no acute distress. Alert and oriented x 3.  Head/Neck: Oropharynx clear.  Sclerae anicteric.  Supple without any enlargements.  Lymph node survey: No cervical, supraclavicular, or inguinal adenopathy.  Cardiovascular: Regular rate and rhythm. S1 and S2 normal.  Lungs: Clear to auscultation bilaterally. No wheezes/crackles/rhonchi noted.  Skin: No rashes or lesions present. Back: No CVA tenderness  Abdomen: Abdomen soft, non-tender and obese. Active bowel sounds in all quadrants. No evidence of abdominal masses.  No herniations. Genitourinary:    Vulva/vagina: Normal external female genitalia. No lesions.    Urethra: No lesions or masses    Vagina: Atrophic without any lesions. No palpable masses. No vaginal bleeding or  drainage noted.    Rectal: Good tone, no masses, no cul de sac nodularity.  Extremities: No bilateral cyanosis, edema, or clubbing.  Quarter size, round nodule superficially on the aspect of the inner left calf.  No pain with palpation, increased warmth, erythema, or drainage noted.   Assessment/Plan: Erika Strong is a 67 year old with a stage IA grade 1 endometrial adenocarcinoma. There is no evidence of disease at this time.  She is to follow up with Dr. Marice Potter in 6 months with a pap smear at that time and with the GYN oncology service in 12 months.  Reportable signs and symptoms reviewed.  She is to call for any questions or concerns.  Erika Faust DEAL, NP 02/09/2013, 7:48 AM

## 2013-05-02 ENCOUNTER — Other Ambulatory Visit: Payer: Self-pay | Admitting: Family Medicine

## 2013-05-09 ENCOUNTER — Encounter: Payer: Self-pay | Admitting: Family Medicine

## 2013-05-09 ENCOUNTER — Ambulatory Visit (INDEPENDENT_AMBULATORY_CARE_PROVIDER_SITE_OTHER): Payer: 59 | Admitting: Family Medicine

## 2013-05-09 VITALS — BP 138/80 | HR 76 | Temp 98.6°F | Ht 67.0 in | Wt 214.0 lb

## 2013-05-09 DIAGNOSIS — R1031 Right lower quadrant pain: Secondary | ICD-10-CM

## 2013-05-09 DIAGNOSIS — R509 Fever, unspecified: Secondary | ICD-10-CM

## 2013-05-09 DIAGNOSIS — R110 Nausea: Secondary | ICD-10-CM

## 2013-05-09 LAB — POCT URINALYSIS DIPSTICK
Bilirubin, UA: NEGATIVE
GLUCOSE UA: NEGATIVE
Ketones, UA: NEGATIVE
Leukocytes, UA: NEGATIVE
NITRITE UA: NEGATIVE
PH UA: 8
Protein, UA: NEGATIVE
RBC UA: NEGATIVE
Spec Grav, UA: 1.02
UROBILINOGEN UA: 0.2

## 2013-05-09 NOTE — Progress Notes (Signed)
Subjective:    Patient ID: Erika Strong, female    DOB: Sep 20, 1945, 68 y.o.   MRN: [id number]  HPI Sxs started Thursday afternoon she suddenly felt nauseated.  No vomiting.  Has persisted since then (8 days).  Fever started that night.  Then Friday night felt better.  Then Saturday started feeling back again.  Fever 102 on Saturday.  No fever relievers.  Says just drank a Coke. She says usually when she feels that she just drinks soda and makes her feel better. No adominal pain.  Right low back with some discomfort and radiating towards the right lower abdomen and groin. No urinary sxs. No dysuria.  Has had some urinary frequency  No diarrhea but a little constipated.  Says her diet habit have been bad since she retired in December.  She says she's a little bit worried because her cancer was found on her right side. She does have a history of endometrial and uterine cancer.  Review of Systems No chest pain, shortness of breath, palpitations. No upper respiratory symptoms at all.  BP 138/80  Pulse 76  Temp(Src) 98.6 F (37 C) (Oral)  Ht 5\' 7"  (1.702 m)  Wt 214 lb (97.07 kg)  BMI 33.51 kg/m2    Allergies  Allergen Reactions  . Atorvastatin     REACTION: myalgias  . Benadryl [Diphenhydramine Hcl]     Causes palpitations  . Lisinopril     REACTION: Cough  . Pravastatin Sodium     REACTION: myalgias  . Simvastatin     REACTION: myalgias    Past Medical History  Diagnosis Date  . Wears glasses   . Hypertension   . Diverticulitis 2010  . Hyperlipidemia     2007  . Cancer   . Endometrial cancer 11/02/2010  . Malignant neoplasm of corpus uteri, except isthmus 01/21/2011    Past Surgical History  Procedure Laterality Date  . Gonadectomy and hysterectomy  11/24/2010    Endometrial cancer, performed by Dr. Janie Morning  . Mole removal  Nov 2012  . Abdominal hysterectomy  11/24/2010    RTLH, BSO, RPLND, LPLNS    History   Social History  . Marital Status: Married   Spouse Name: [name]    Number of Children: 1   . Years of Education: Assoc degr   Occupational History  . cosmotologist/teacher   .  [place] History Main Topics  . Smoking status: Never Smoker   . Smokeless tobacco: Never Used  . Alcohol Use: No  . Drug Use: No  . Sexual Activity: No     Comment: cosmetologist, teaches, associate degree, married, regularly exercises.    Other Topics Concern  . Not on file   Social History Narrative   Walks daily for exercise.     Family History  Problem Relation Age of Onset  . Lung cancer Father     smoker  . Cancer Father   . Osteoporosis Mother   . Rheum arthritis Mother   . Cancer Mother     Outpatient Encounter Prescriptions as of 05/09/2013  Medication Sig  . Ascorbic Acid (VITAMIN C) 1000 MG tablet Take 1,000 mg by mouth daily.    . calcium carbonate (OS-CAL) 600 MG TABS Take 600 mg by mouth 2 (two) times daily with a meal.    . chlorthalidone (HYGROTON) 50 MG tablet TAKE 1 TABLET BY MOUTH DAILY  . fish oil-omega-3 fatty acids 1000 MG capsule Take 2 g by  mouth 2 (two) times daily.    Marland Kitchen ibuprofen (ADVIL,MOTRIN) 800 MG tablet Take 1 tablet (800 mg total) by mouth every 8 (eight) hours as needed.  . Magnesium 250 MG TABS Take 250 mg by mouth daily.    . meloxicam (MOBIC) 15 MG tablet Take 1 tablet (15 mg total) by mouth daily.  . Multiple Vitamin (MULTIVITAMIN) tablet Take 1 tablet by mouth 2 (two) times daily.    . Nutritional Supplements (ADULT NUTRITIONAL SUPPLEMENT PO) Take by mouth daily. Protandim           Objective:   Physical Exam  Constitutional: She is oriented to person, place, and time. She appears well-developed and well-nourished.  HENT:  Head: Normocephalic and atraumatic.  Neck: Neck supple. No thyromegaly present.  Cardiovascular: Normal rate, regular rhythm and normal heart sounds.   Pulmonary/Chest: Effort normal and breath sounds normal.  Abdominal: Soft. Bowel sounds are  normal. She exhibits no distension and no mass. There is tenderness. There is no rebound and no guarding.  Mild tenderness in the RLQ with deep palpatoin.    Lymphadenopathy:    She has no cervical adenopathy.  Neurological: She is alert and oriented to person, place, and time.  Skin: Skin is warm and dry.  Psychiatric: She has a normal mood and affect. Her behavior is normal.          Assessment & Plan:  Nausea/fever-unclear etiology at this point. She is having a little bit of right low back pain rating around to the flank. Did do a urinalysis that was negative for acute UTI. She doesn't have significant tenderness or rebound on exam. There consider appendicitis. Also consider diverticulitis though she has had this previously there typically symptoms are more left-sided. Recommend stay hydrated and drinking plenty of fluids. Can certainly use over-the-counter antipyretics. We'll check a CBC and CMP.

## 2013-05-10 LAB — COMPLETE METABOLIC PANEL WITH GFR
ALBUMIN: 4 g/dL (ref 3.5–5.2)
ALT: 46 U/L — ABNORMAL HIGH (ref 0–35)
AST: 42 U/L — ABNORMAL HIGH (ref 0–37)
Alkaline Phosphatase: 63 U/L (ref 39–117)
BILIRUBIN TOTAL: 0.6 mg/dL (ref 0.2–1.2)
BUN: 12 mg/dL (ref 6–23)
CO2: 31 meq/L (ref 19–32)
Calcium: 9.2 mg/dL (ref 8.4–10.5)
Chloride: 91 mEq/L — ABNORMAL LOW (ref 96–112)
Creat: 0.72 mg/dL (ref 0.50–1.10)
GFR, EST NON AFRICAN AMERICAN: 88 mL/min
GLUCOSE: 77 mg/dL (ref 70–99)
Potassium: 4 mEq/L (ref 3.5–5.3)
SODIUM: 134 meq/L — AB (ref 135–145)
TOTAL PROTEIN: 6.7 g/dL (ref 6.0–8.3)

## 2013-05-10 LAB — CBC WITH DIFFERENTIAL/PLATELET
Basophils Absolute: 0 10*3/uL (ref 0.0–0.1)
Basophils Relative: 0 % (ref 0–1)
EOS ABS: 0.2 10*3/uL (ref 0.0–0.7)
Eosinophils Relative: 2 % (ref 0–5)
HEMATOCRIT: 41 % (ref 36.0–46.0)
HEMOGLOBIN: 14 g/dL (ref 12.0–15.0)
LYMPHS ABS: 2.6 10*3/uL (ref 0.7–4.0)
Lymphocytes Relative: 26 % (ref 12–46)
MCH: 29.4 pg (ref 26.0–34.0)
MCHC: 34.1 g/dL (ref 30.0–36.0)
MCV: 86.1 fL (ref 78.0–100.0)
MONOS PCT: 18 % — AB (ref 3–12)
Monocytes Absolute: 1.8 10*3/uL — ABNORMAL HIGH (ref 0.1–1.0)
NEUTROS PCT: 54 % (ref 43–77)
Neutro Abs: 5.5 10*3/uL (ref 1.7–7.7)
Platelets: 311 10*3/uL (ref 150–400)
RBC: 4.76 MIL/uL (ref 3.87–5.11)
RDW: 14.3 % (ref 11.5–15.5)
WBC: 10.1 10*3/uL (ref 4.0–10.5)

## 2013-05-21 ENCOUNTER — Telehealth: Payer: Self-pay | Admitting: *Deleted

## 2013-05-21 DIAGNOSIS — R1031 Right lower quadrant pain: Secondary | ICD-10-CM

## 2013-05-21 NOTE — Telephone Encounter (Signed)
Ordered

## 2013-05-21 NOTE — Telephone Encounter (Signed)
Pt calls and would like to go ahead and proceed with the CT that you suggested back at appt. In early March.  States over the weekend had some problems and feels like she should go ahead and proceed with the CT. Clemetine Marker, LPN

## 2013-05-21 NOTE — Telephone Encounter (Signed)
Precert obtained through [facility] online for CT Abdomen/Pelvis with contrast.  Auth # [id number]- good from 05/21/13 to 07/05/13.  [facility] notified. Clemetine Marker, LPN

## 2013-05-22 ENCOUNTER — Ambulatory Visit (INDEPENDENT_AMBULATORY_CARE_PROVIDER_SITE_OTHER): Payer: 59

## 2013-05-22 ENCOUNTER — Ambulatory Visit (INDEPENDENT_AMBULATORY_CARE_PROVIDER_SITE_OTHER): Payer: 59 | Admitting: Family Medicine

## 2013-05-22 ENCOUNTER — Encounter: Payer: Self-pay | Admitting: Family Medicine

## 2013-05-22 VITALS — BP 171/88 | HR 80 | Wt 213.0 lb

## 2013-05-22 DIAGNOSIS — R599 Enlarged lymph nodes, unspecified: Secondary | ICD-10-CM

## 2013-05-22 DIAGNOSIS — K449 Diaphragmatic hernia without obstruction or gangrene: Secondary | ICD-10-CM

## 2013-05-22 DIAGNOSIS — K7689 Other specified diseases of liver: Secondary | ICD-10-CM

## 2013-05-22 DIAGNOSIS — K5732 Diverticulitis of large intestine without perforation or abscess without bleeding: Secondary | ICD-10-CM

## 2013-05-22 DIAGNOSIS — R1902 Left upper quadrant abdominal swelling, mass and lump: Secondary | ICD-10-CM

## 2013-05-22 DIAGNOSIS — Z90710 Acquired absence of both cervix and uterus: Secondary | ICD-10-CM

## 2013-05-22 DIAGNOSIS — R1900 Intra-abdominal and pelvic swelling, mass and lump, unspecified site: Secondary | ICD-10-CM

## 2013-05-22 DIAGNOSIS — K439 Ventral hernia without obstruction or gangrene: Secondary | ICD-10-CM

## 2013-05-22 DIAGNOSIS — R1031 Right lower quadrant pain: Secondary | ICD-10-CM

## 2013-05-22 MED ORDER — METRONIDAZOLE 500 MG PO TABS: 500.0000 mg | ORAL_TABLET | Freq: Three times a day (TID) | ORAL | Status: DC

## 2013-05-22 MED ORDER — CIPROFLOXACIN HCL 500 MG PO TABS: 500.0000 mg | ORAL_TABLET | Freq: Two times a day (BID) | ORAL | Status: DC

## 2013-05-22 MED ORDER — IOHEXOL 300 MG/ML  SOLN: 100.0000 mL | Freq: Once | INTRAMUSCULAR | Status: AC | PRN

## 2013-05-22 NOTE — Progress Notes (Signed)
   Subjective:    Patient ID: Erika Strong, female    DOB: 1945-05-20, 68 y.o.   MRN: [id number]  HPI  I asked patient to come in today to go over her CT results. Since I last saw her she had she says she feels much better. She has not had any low-grade temperatures. She eventually got better but says over the weekend did have an episode of nausea with dry heaving. She started to get some lower pelvic pain on the right and left side, and had some chills. But she has felt well since then. She thought it may have been an acute illness since she had a neighbor who recently had a stomach bug.  Her husband was here today for support and participated in the discussion today.  Review of Systems     Objective:   Physical Exam  Constitutional: She appears well-developed and well-nourished.  HENT:  Head: Normocephalic and atraumatic.  Skin: Skin is warm and dry.  Psychiatric: She has a normal mood and affect. Her behavior is normal.          Assessment & Plan:  CT scan of abdomen is consistent with diverticulitis-will treat with metronidazole and ciprofloxacin. She's to call me if she doesn't tolerate this regimen or develops a yeast infection. Unfortunately, he was also noted that she had a 4 cm mass in the right lower quadrant along anterior wall. The radiologist felt like this was very amenable to percutaneous biopsy through interventional radiology. I discussed this with her and we will schedule it as soon as possible. She also has a swollen lymph node on the right side of the colon. She does have a history of endometrial cancer status post hysterectomy and oophorectomy.  Hopefully this is not a secondary recurrence. She has been followed every 6 months since having the surgery in 2012. She says she plans to be positive about this.  Dr. Bonna Gains did last colonoscopy. D.r Janie Morning did her hysterectomy.

## 2013-05-23 ENCOUNTER — Telehealth: Payer: Self-pay | Admitting: *Deleted

## 2013-05-23 NOTE — Telephone Encounter (Signed)
No PA required for CT Biopsy. CPT code [phone].  Oscar La, LPN

## 2013-05-23 NOTE — Addendum Note (Signed)
Addended by: Teddy Spike on: 05/23/2013 08:43 AM   Modules accepted: Orders

## 2013-05-25 ENCOUNTER — Encounter (HOSPITAL_COMMUNITY): Payer: Self-pay | Admitting: Pharmacy Technician

## 2013-05-28 ENCOUNTER — Other Ambulatory Visit: Payer: Self-pay | Admitting: Radiology

## 2013-05-30 ENCOUNTER — Other Ambulatory Visit: Payer: Self-pay | Admitting: Radiology

## 2013-05-31 ENCOUNTER — Encounter (HOSPITAL_COMMUNITY): Payer: Self-pay

## 2013-05-31 ENCOUNTER — Ambulatory Visit (HOSPITAL_COMMUNITY)
Admission: RE | Admit: 2013-05-31 | Discharge: 2013-05-31 | Disposition: A | Discharge status: Home / Self Care | Payer: Medicare Other | Source: Ambulatory Visit | Attending: Family Medicine | Admitting: Family Medicine

## 2013-05-31 DIAGNOSIS — R1900 Intra-abdominal and pelvic swelling, mass and lump, unspecified site: Secondary | ICD-10-CM

## 2013-05-31 LAB — PROTIME-INR
INR: 1.04 (ref 0.00–1.49)
Prothrombin Time: 13.4 seconds (ref 11.6–15.2)

## 2013-05-31 LAB — CBC
HCT: 43.2 % (ref 36.0–46.0)
Hemoglobin: 15.1 g/dL — ABNORMAL HIGH (ref 12.0–15.0)
MCH: 30.1 pg (ref 26.0–34.0)
MCHC: 35 g/dL (ref 30.0–36.0)
MCV: 86.2 fL (ref 78.0–100.0)
Platelets: 203 10*3/uL (ref 150–400)
RBC: 5.01 MIL/uL (ref 3.87–5.11)
RDW: 14 % (ref 11.5–15.5)
WBC: 7.6 10*3/uL (ref 4.0–10.5)

## 2013-05-31 LAB — APTT: aPTT: 28 seconds (ref 24–37)

## 2013-05-31 MED ORDER — FENTANYL CITRATE 0.05 MG/ML IJ SOLN
INTRAMUSCULAR | Status: AC | PRN
  Administered 2013-05-31: 50 ug via INTRAVENOUS

## 2013-05-31 MED ORDER — MIDAZOLAM HCL 2 MG/2ML IJ SOLN
INTRAMUSCULAR | Status: AC | PRN
  Administered 2013-05-31: 2 mg via INTRAVENOUS

## 2013-05-31 MED ORDER — FENTANYL CITRATE 0.05 MG/ML IJ SOLN
INTRAMUSCULAR | Status: AC
  Filled 2013-05-31: qty 4

## 2013-05-31 MED ORDER — SODIUM CHLORIDE 0.9 % IV SOLN: INTRAVENOUS | Status: DC

## 2013-05-31 MED ORDER — MIDAZOLAM HCL 2 MG/2ML IJ SOLN
INTRAMUSCULAR | Status: AC
  Filled 2013-05-31: qty 4

## 2013-05-31 MED ORDER — LIDOCAINE HCL 1 % IJ SOLN
INTRAMUSCULAR | Status: AC
  Filled 2013-05-31: qty 10

## 2013-05-31 NOTE — H&P (Signed)
Erika Strong is an 68 y.o. female.   Chief Complaint: Pt has hx Uterine Ca Developed Nausea and chills and abd pain off and on x 1 month. CT 05/23/13 reveals abdomen mass Scheduled now for biopsy of same  HPI: HTN; diverticulitis; HLD; Ut Ca  Past Medical History  Diagnosis Date  . Wears glasses   . Hypertension   . Diverticulitis 2010  . Hyperlipidemia     2007  . Cancer   . Endometrial cancer 11/02/2010  . Malignant neoplasm of corpus uteri, except isthmus 01/21/2011    Past Surgical History  Procedure Laterality Date  . Gonadectomy and hysterectomy  11/24/2010    Endometrial cancer, performed by Dr. Janie Morning  . Mole removal  Nov 2012  . Abdominal hysterectomy  11/24/2010    RTLH, BSO, RPLND, LPLNS    Family History  Problem Relation Age of Onset  . Lung cancer Father     smoker  . Cancer Father   . Osteoporosis Mother   . Rheum arthritis Mother   . Cancer Mother    Social History:  reports that she has never smoked. She has never used smokeless tobacco. She reports that she does not drink alcohol or use illicit drugs.  Allergies:  Allergies  Allergen Reactions  . Atorvastatin Other (See Comments)     myalgias  . Benadryl [Diphenhydramine Hcl] Other (See Comments)    Causes palpitations  . Fish Allergy Nausea And Vomiting  . Lisinopril Other (See Comments)     Cough  . Pravastatin Sodium Other (See Comments)     myalgias  . Simvastatin Other (See Comments)     myalgias     (Not in a hospital admission)  Results for orders placed during the hospital encounter of 05/31/13 (from the past 48 hour(s))  APTT     Status: None   Collection Time    05/31/13  8:50 AM      Result Value Ref Range   aPTT 28  24 - 37 seconds  CBC     Status: Abnormal   Collection Time    05/31/13  8:50 AM      Result Value Ref Range   WBC 7.6  4.0 - 10.5 K/uL   RBC 5.01  3.87 - 5.11 MIL/uL   Hemoglobin 15.1 (*) 12.0 - 15.0 g/dL   HCT 43.2  36.0 - 46.0 %   MCV 86.2   78.0 - 100.0 fL   MCH 30.1  26.0 - 34.0 pg   MCHC 35.0  30.0 - 36.0 g/dL   RDW 14.0  11.5 - 15.5 %   Platelets 203  150 - 400 K/uL  PROTIME-INR     Status: None   Collection Time    05/31/13  8:50 AM      Result Value Ref Range   Prothrombin Time 13.4  11.6 - 15.2 seconds   INR 1.04  0.00 - 1.49   No results found.  Review of Systems  Constitutional: Negative for fever and weight loss.  Respiratory: Negative for shortness of breath.   Cardiovascular: Negative for chest pain.  Gastrointestinal: Positive for abdominal pain. Negative for nausea and vomiting.  Neurological: Negative for weakness.  Psychiatric/Behavioral: Negative for substance abuse.    Blood pressure 162/71, pulse 73, temperature 97.8 F (36.6 C), temperature source Oral, resp. rate 18, height 5\' 7"  (1.702 m), weight 96.616 kg (213 lb), SpO2 99.00%. Physical Exam  Constitutional: She is oriented to person, place, and time. She  appears well-developed and well-nourished.  Cardiovascular: Normal rate, regular rhythm and normal heart sounds.   No murmur heard. Respiratory: Effort normal and breath sounds normal. She has no wheezes.  GI: Soft. Bowel sounds are normal. There is no tenderness.  Musculoskeletal: Normal range of motion.  Neurological: She is alert and oriented to person, place, and time.  Skin: Skin is warm and dry.  Psychiatric: She has a normal mood and affect. Her behavior is normal. Judgment and thought content normal.     Assessment/Plan Abd pain; Nausea/ chills Off and on x 1 mo Hx Ut Ca CT shows abd mass Scheduled for biopsy Pt aware of procedure benefits and risks and agreeable to proceed Consent signed and in chart  Lavonia Drafts 05/31/2013, 9:54 AM

## 2013-05-31 NOTE — Discharge Instructions (Signed)
Biopsy °Care After °These instructions give you information on caring for yourself after your procedure. Your doctor may also give you more specific instructions. Call your doctor if you have any problems or questions after your procedure. °HOME CARE  °· Return to your normal diet and activities as told by your doctor. °· Change your bandages (dressings) as told by your doctor. If skin glue (adhesive) was used, it will peel off in 7 days. °· Only take medicines as told by your doctor. °· Ask your doctor when you can bathe and get your wound wet. °GET HELP RIGHT AWAY IF: °· You see more than a small spot of blood coming from the wound. °· You have redness, puffiness (swelling), or pain. °· You see yellowish-white fluid (pus) coming from the wound. °· You have a fever. °· You notice a bad smell coming from the wound or bandage. °· You have a rash, trouble breathing, or any allergy problems. °MAKE SURE YOU:  °· Understand these instructions. °· Will watch your condition. °· Will get help right away if you are not doing well or get worse. °Document Released: 10/13/2010 Document Revised: 05/03/2011 Document Reviewed: 10/13/2010 °[employer]® Patient Information ©2014 [employer]. ° °

## 2013-05-31 NOTE — Procedures (Signed)
Successful CT GUIDED ABD MESENTERIC MASS 18 G CORE BX NO COMP STABLE PATH PENDING FULL REPORT IN PACS

## 2013-06-04 ENCOUNTER — Telehealth: Payer: Self-pay | Admitting: Gynecologic Oncology

## 2013-06-04 NOTE — Telephone Encounter (Signed)
Spoke with patient about recent abdominal biopsy.  Stating on March 18, she started feeling bad and was negative for a UTI and her labs were normal.  "I was fine after that but started feeling bad on Saturday, March 28 with chills, nausea, etc."  She had a CT scan that resulted diverticulitis along with a 4.1 cm pelvic mass.  The mass was biopsied and resulted metastatic adenocarcinoma, endometrioid type.  Patient can be seen by Dr. Skeet Latch in the [facility] office on April 23 but she would like to try to see her sooner at [facility] if appointment available.  Email sent to Dr. Leone Brand RN and attempted to call the clinic with no answer.  Will retry in the am.  Patient reassured and advised to call the office with any questions or concerns.  She will be contacted once contact has been made with [facility].

## 2013-06-11 ENCOUNTER — Other Ambulatory Visit: Payer: Self-pay | Admitting: Gynecologic Oncology

## 2013-06-11 ENCOUNTER — Telehealth: Payer: Self-pay | Admitting: Oncology

## 2013-06-11 DIAGNOSIS — C541 Malignant neoplasm of endometrium: Secondary | ICD-10-CM

## 2013-06-11 NOTE — Telephone Encounter (Signed)
S/W PATIENT AND GAVE NEW PATIENT APPT FOR 04/29 @ 11, CHEMIO EDU ON 04/22 @ 10.  WELCOME PACKET MAILED.

## 2013-06-13 ENCOUNTER — Other Ambulatory Visit: Payer: Medicare Other

## 2013-06-13 ENCOUNTER — Telehealth: Payer: Self-pay | Admitting: *Deleted

## 2013-06-13 ENCOUNTER — Encounter: Payer: Self-pay | Admitting: *Deleted

## 2013-06-13 NOTE — Telephone Encounter (Signed)
Pt called discussed concerns regarding upcoming treatment and appts.

## 2013-06-14 ENCOUNTER — Other Ambulatory Visit: Payer: Self-pay | Admitting: Oncology

## 2013-06-14 ENCOUNTER — Ambulatory Visit: Payer: 59 | Admitting: Gynecologic Oncology

## 2013-06-14 DIAGNOSIS — C541 Malignant neoplasm of endometrium: Secondary | ICD-10-CM

## 2013-06-14 NOTE — Progress Notes (Signed)
Medical Oncology  [facility] information received, recommendation for taxane/ platin x 3 cycles. Request sent to schedulers and appropriate RNs for chemo education prior to my visit 06-20-13 if possible. Anticipate q 3 week taxol carboplatin.  Godfrey Pick, MD

## 2013-06-17 ENCOUNTER — Other Ambulatory Visit: Payer: Self-pay | Admitting: Oncology

## 2013-06-17 DIAGNOSIS — C541 Malignant neoplasm of endometrium: Secondary | ICD-10-CM

## 2013-06-19 ENCOUNTER — Other Ambulatory Visit: Payer: 59

## 2013-06-20 ENCOUNTER — Encounter: Payer: Self-pay | Admitting: Oncology

## 2013-06-20 ENCOUNTER — Ambulatory Visit: Payer: 59

## 2013-06-20 ENCOUNTER — Ambulatory Visit (HOSPITAL_BASED_OUTPATIENT_CLINIC_OR_DEPARTMENT_OTHER): Payer: 59 | Admitting: Oncology

## 2013-06-20 ENCOUNTER — Telehealth: Payer: Self-pay | Admitting: Oncology

## 2013-06-20 ENCOUNTER — Ambulatory Visit (HOSPITAL_BASED_OUTPATIENT_CLINIC_OR_DEPARTMENT_OTHER): Payer: 59

## 2013-06-20 ENCOUNTER — Telehealth: Payer: Self-pay | Admitting: *Deleted

## 2013-06-20 VITALS — BP 163/78 | HR 77 | Temp 98.0°F | Resp 18 | Ht 67.0 in | Wt 206.0 lb

## 2013-06-20 DIAGNOSIS — C541 Malignant neoplasm of endometrium: Secondary | ICD-10-CM

## 2013-06-20 DIAGNOSIS — I10 Essential (primary) hypertension: Secondary | ICD-10-CM

## 2013-06-20 DIAGNOSIS — C549 Malignant neoplasm of corpus uteri, unspecified: Secondary | ICD-10-CM

## 2013-06-20 DIAGNOSIS — K5730 Diverticulosis of large intestine without perforation or abscess without bleeding: Secondary | ICD-10-CM

## 2013-06-20 DIAGNOSIS — K7689 Other specified diseases of liver: Secondary | ICD-10-CM

## 2013-06-20 DIAGNOSIS — E669 Obesity, unspecified: Secondary | ICD-10-CM

## 2013-06-20 DIAGNOSIS — C7989 Secondary malignant neoplasm of other specified sites: Secondary | ICD-10-CM

## 2013-06-20 LAB — CBC WITH DIFFERENTIAL/PLATELET
BASO%: 0.4 % (ref 0.0–2.0)
Basophils Absolute: 0 10*3/uL (ref 0.0–0.1)
EOS%: 1.9 % (ref 0.0–7.0)
Eosinophils Absolute: 0.1 10*3/uL (ref 0.0–0.5)
HCT: 43.2 % (ref 34.8–46.6)
HGB: 14.1 g/dL (ref 11.6–15.9)
LYMPH%: 28.4 % (ref 14.0–49.7)
MCH: 28.6 pg (ref 25.1–34.0)
MCHC: 32.6 g/dL (ref 31.5–36.0)
MCV: 87.7 fL (ref 79.5–101.0)
MONO#: 0.9 10*3/uL (ref 0.1–0.9)
MONO%: 11 % (ref 0.0–14.0)
NEUT%: 58.3 % (ref 38.4–76.8)
NEUTROS ABS: 4.6 10*3/uL (ref 1.5–6.5)
Platelets: 293 10*3/uL (ref 145–400)
RBC: 4.92 10*6/uL (ref 3.70–5.45)
RDW: 13.7 % (ref 11.2–14.5)
WBC: 7.8 10*3/uL (ref 3.9–10.3)
lymph#: 2.2 10*3/uL (ref 0.9–3.3)

## 2013-06-20 LAB — COMPREHENSIVE METABOLIC PANEL (CC13)
ALBUMIN: 3.7 g/dL (ref 3.5–5.0)
ALT: 21 U/L (ref 0–55)
AST: 21 U/L (ref 5–34)
Alkaline Phosphatase: 59 U/L (ref 40–150)
Anion Gap: 10 mEq/L (ref 3–11)
BUN: 14.6 mg/dL (ref 7.0–26.0)
CALCIUM: 10.1 mg/dL (ref 8.4–10.4)
CHLORIDE: 100 meq/L (ref 98–109)
CO2: 29 mEq/L (ref 22–29)
Creatinine: 0.8 mg/dL (ref 0.6–1.1)
Glucose: 116 mg/dl (ref 70–140)
POTASSIUM: 3.5 meq/L (ref 3.5–5.1)
Sodium: 139 mEq/L (ref 136–145)
TOTAL PROTEIN: 7 g/dL (ref 6.4–8.3)
Total Bilirubin: 0.42 mg/dL (ref 0.20–1.20)

## 2013-06-20 NOTE — Telephone Encounter (Signed)
, °

## 2013-06-20 NOTE — Progress Notes (Signed)
Checked in new pt with no financial concerns. °

## 2013-06-20 NOTE — Patient Instructions (Signed)
We will send prescriptions to your pharmacy for 3 medicines: (generics are fine if you prefer to name brand)  Decadron (dexamethasone, steroid) 4 mg:  Five tablets (=20 mg) with food 12 hrs and 6 hrs prior to chemo.  Zofran (ondansetron) 8 mg:  1-2 tablets every 12 hrs as needed for nausea. This will not make you drowsy. Call if you get headache and we will tell you about taking a dose of benadryl for this if so. Take one zofran the AM after chemo whether or not any nausea then.  Ativan (lorazepam) 1 mg:  1/2 - 1 tablet every 6 hrs as needed for nausea. This will make you drowsy and possibly a little forgetful just around the dose. You can swallow the pill or dissolve it under your tongue. Night of chemo, whether or not any nausea, take ativan at bedtime.   Stool softener Colace (docusate sodium) or Senokot S (vegetable laxative + stool softener) one or two tablets once or twice daily are fine to use if you notice constipation.

## 2013-06-20 NOTE — Progress Notes (Signed)
[place] NEW PATIENT EVALUATION   Name: KESLIE GRITZ Date: 06/20/2013 MRN: [id number] DOB: 10-21-1945  REFERRING PHYSICIAN: Janie Morning CC: Beatrice Lecher; Darlis Loan ([facility])   REASON FOR REFERRAL: recurrent endometrial carcinoma   HISTORY OF PRESENT ILLNESS:Prescious I Karpf is a 68 y.o. female who is seen in consultation, together with her husband, at the request of Dr Janie Morning, with recently diagnosed recurrent endometrial cancer involving pelvis. She saw Dr Skeet Latch following biopsy, with recommendation for treatment with chemotherapy. She and husband attended chemotherapy education class prior to this visit.  History goes back to 07-2010 when she presented with vaginal spotting. Endometrial biopsy by Dr Clovia Cuff 10-21-10 revealed endometroid carcinoma grade 1 focally involving polyp. She had robotic assisted total laparoscopic hysterectomy BSO and 20 pelvic node evaluation by Dr Skeet Latch on 11-24-2010. Pathology [phone]) had endometrial adenocarcinoma Grade I with focal squamous differentiation, superficial myometrial invasion ( 0.2 cm where myometrium 1.4 cm); benign cervix, tubes and ovaries; no LVSI. With no high risk features, she was followed with observation only, including exams by gyn oncology every 6 months.   In early March 2015 she had acute onset of lower abdominal pain, then fever and nausea over next several days. She was treated by PCP for presumed diverticulitis with cipro and flagyl, initially with improvement then symptoms recurrent in late March. She had CT AP 05-23-13 showed a 4.1x4.1 cm mass left anterior upper pelvis, and CT biopsy 05-31-13 of mesenteric mass at level of umbilicus showed metastatic adenocarcinoma consistent with endometrioid carcinoma. Patient saw Dr Skeet Latch at [facility] on 06-08-13, with exam remarkable for palpable right pelvic mass and recommendation for 3 cycles of taxane / platin chemotherapy, then  repeat imaging; may also consider radiation after chemotherapy. The initial abdominal pain resolved entirely.    REVIEW OF SYSTEMS as above, also: No HA, good visual acuity with glasses, no allergic sinusitis, no dental problems, no decreased hearing. No change in breasts noted. No cardiac symptoms. Weight is at usual. No nausea, vomiting or GERD. Bowels are moving well now with flax seed oil for past 2 weeks. No arthritis, No LE swelling. No bleeding. No peripheral neuropathy  Remainder of full 10 point review of systems negative.   ALLERGIES: Atorvastatin; Benadryl; Fish allergy; Lisinopril; Pravastatin sodium; and Simvastatin  PAST MEDICAL/ SURGICAL HISTORY:    Mammograms due in [place] now Colonoscopy ~ 3 years ago, due again in 24 years G1P1 son age 29 HTN x years Elevated lipids Diverticulitis No other surgery  CURRENT MEDICATIONS: reviewed as listed now in EMR. She has used phenergan for nausea in past, tolerated well  Written and oral instructions given as follows: We will send prescriptions to your pharmacy for 3 medicines: (generics are fine if you prefer to name brand)  Decadron (dexamethasone, steroid) 4 mg:  Five tablets (=20 mg) with food 12 hrs and 6 hrs prior to chemo.  Zofran (ondansetron) 8 mg:  1-2 tablets every 12 hrs as needed for nausea. This will not make you drowsy. Call if you get headache and we will tell you about taking a dose of benadryl for this if so. Take one zofran the AM after chemo whether or not any nausea then.  Ativan (lorazepam) 1 mg:  1/2 - 1 tablet every 6 hrs as needed for nausea. This will make you drowsy and possibly a little forgetful just around the dose. You can swallow the pill or dissolve it under your tongue. Night of chemo,  whether or not any nausea, take ativan at bedtime.   Stool softener Colace (docusate sodium) or Senokot S (vegetable laxative + stool softener) one or two tablets once or twice daily are fine to use if you  notice constipation    PHARMACY [facility] [place]   SOCIAL HISTORY: from [place]. Married x 48 years. 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 Husband retired Dealer. Large garden. Never smoker. No ETOH. One son, 2 grandsons ages 16 and 57   FAMILY HISTORY:  Father lung ca (smoker) Mother RA, osteoporosis One brother low grade lymphoma 2 brothers healthy Son and grandsons healthy          PHYSICAL EXAM:  height is 5\' 7"  (1.702 m) and weight is 206 lb (93.441 kg). Her oral temperature is 98 F (36.7 C). Her blood pressure is 163/78 and her pulse is 77. Her respiration is 18 and oxygen saturation is 100%.  Alert, pleasant, cooperative lady, looks stated age. Easily mobile, good historian, husband very supportive.  HEENT: normal hair pattern. PERRL, not icteric. Oral mucosa moist and clear. Neck supple without JVD or thyromegaly.  RESPIRATORY: clear to A and P. Respirations not labored RA  CARDIAC/ VASCULAR: RRR without gallop. Clear heart sounds  ABDOMEN: obese, soft, nontender, no appreciable HSM or mass. Normally active BS  LYMPH NODES: no cervical, supraclavicular, axillary or inguinal nodes  BREASTS:bilaterally without dominant mass, skin or nipple findings of concern  NEUROLOGIC: CN, motor, sensory, cerebellar nonfocal. PSYCH: normal mood and affect  SKIN: without ecchymosis, rash, petechiae  MUSCULOSKELETAL: back nontender. No edema, cords, tenderness. Good muscle mass bilaterally. Peripheral veins look adequate for chemotherapy    LABORATORY DATA:  Results for orders placed in visit on 06/20/13 (from the past 48 hour(s))  COMPREHENSIVE METABOLIC PANEL (0000000)     Status: None   Collection Time    06/20/13 11:24 AM      Result Value Ref Range   Sodium 139  136 - 145 mEq/L   Potassium 3.5  3.5 - 5.1 mEq/L   Chloride 100  98 - 109 mEq/L   CO2 29  22 - 29 mEq/L   Glucose 116  70 -  140 mg/dl   BUN 14.6  7.0 - 26.0 mg/dL   Creatinine 0.8  0.6 - 1.1 mg/dL   Total Bilirubin 0.42  0.20 - 1.20 mg/dL   Alkaline Phosphatase 59  40 - 150 U/L   AST 21  5 - 34 U/L   ALT 21  0 - 55 U/L   Total Protein 7.0  6.4 - 8.3 g/dL   Albumin 3.7  3.5 - 5.0 g/dL   Calcium 10.1  8.4 - 10.4 mg/dL   Anion Gap 10  3 - 11 mEq/L  CBC WITH DIFFERENTIAL     Status: None   Collection Time    06/20/13 11:24 AM      Result Value Ref Range   WBC 7.8  3.9 - 10.3 10e3/uL   NEUT# 4.6  1.5 - 6.5 10e3/uL   HGB 14.1  11.6 - 15.9 g/dL   HCT 43.2  34.8 - 46.6 %   Platelets 293  145 - 400 10e3/uL   MCV 87.7  79.5 - 101.0 fL   MCH 28.6  25.1 - 34.0 pg   MCHC 32.6  31.5 - 36.0 g/dL   RBC 4.92  3.70 - 5.45 10e6/uL   RDW 13.7  11.2 - 14.5 %  lymph# 2.2  0.9 - 3.3 10e3/uL   MONO# 0.9  0.1 - 0.9 10e3/uL   Eosinophils Absolute 0.1  0.0 - 0.5 10e3/uL   Basophils Absolute 0.0  0.0 - 0.1 10e3/uL   NEUT% 58.3  38.4 - 76.8 %   LYMPH% 28.4  14.0 - 49.7 %   MONO% 11.0  0.0 - 14.0 %   EOS% 1.9  0.0 - 7.0 %   BASO% 0.4  0.0 - 2.0 %      PATHOLOGY:  Collected: 05/31/2013 Accession: [id number] DIAGNOSIS Diagnosis Mesentery, ABD mesenteric mass - METASTATIC ADENOCARCINOMA, CONSISTENT WITH ENDOMETRIOID ADENOCARCINOMA.     EXAM:  CT ABDOMEN AND PELVIS WITH CONTRAST   COMPARISON: November 14, 2008  FINDINGS:  Lung bases are clear. There is a focal hiatal hernia.  Liver is enlarged, measuring 21.1 cm in length. There is diffuse  fatty change in the liver. No focal liver lesions are identified.  There is no biliary duct dilatation. Gallbladder wall is not  appreciably thickened.  Spleen, pancreas, and adrenals appear normal. Kidneys bilaterally  show no appreciable mass or hydronephrosis. There is no renal or  ureteral calculus on either side.  In the pelvis, there is a mass in the left anterior upper pelvis  measuring 4.1 x 4.1 cm which does not connect to bowel. Uterus is  absent. There is a  lymph node lateral to the rectum on the right  measuring 2.1 x 1.7 cm, consistent with adenopathy.  The urinary bladder is midline with normal wall thickness. Uterus is  absent  There is wall thickening throughout much of the sigmoid colon with  mucosal irregularity consistent with diverticulitis. There is no  abscess.  There is no bowel obstruction. No free air or portal venous air.  There is no ascites or abscess in the abdomen or pelvis. No other  adenopathy is seen beyond that described lateral to the rectum on  the right.  There is atherosclerotic change in the aorta but no aneurysm. There  are no blastic or lytic bone lesions. There is a minimal ventral  hernia containing only fat.  IMPRESSION:  There is a mass in the anterior upper left pelvis which has an  appearance concerning for potential omental mass/metastasis. Ovarian  carcinoma is the most likely etiology for such a lesion, although  other primary neoplasms such as colon carcinoma may present in this  manner.  Focal enlarged lymph node lateral to the rectum on the right.  Neoplastic focus suspected in this area.  Sigmoid diverticulitis without frank abscess. It should be noted  that neoplasia within this area of inflammation in the sigmoid colon  cannot be excluded.  Enlarged liver with fatty change. No focal liver lesions are  identified.  Focal hiatal hernia.  Small ventral hernia containing fat.  Uterus absent.       DISCUSSION: We have reviewed all of history as above, including CT and pathology information and rationale for chemotherapy now. They understand that treatment is in attempt to control this disease and that chemotherapy may be of significant benefit in shrinking and controlling this metastatic disease. We have discussed outpatient administration of chemotherapy including follow up exams and bloodwork, premedication steroids, antiemetics, diet, recommendation that she not work at least thru first  cycle, and plan for repeat imaging after ~ 3 cycles.  We have discussed mechanism of action of chemotherapy and possible side effects including taxol aches and peripheral neuropathy.They have had all questions answered to their satisfaction and are  in agreement with plan.      IMPRESSION / PLAN:   1.Endometrial carcinoma initially IA grade 1 at surgery 11-2010, now with multifocal recurrence in pelvis. Will begin chemotherapy with taxol and carboplatin on 06-26-13, planning 3 cycles then repeat imaging. I will see her with counts week after treatment. 2.HTN 3.fatty liver and obesity with BMI 32 4.diverticulosis 5.due mammograms, which she will do   Patient and accompanying individuals have had questions answered to their satisfaction and are in agreement with plan above. They can contact this office for questions or concerns at any time prior to next scheduled visit.  Time spent  55 min, including >50% discussion and coordination of care.    Gordy Levan, MD 06/20/2013 3:58 PM

## 2013-06-20 NOTE — Telephone Encounter (Signed)
Per staff message and POF I have scheduled appts.  JMW  

## 2013-06-21 ENCOUNTER — Other Ambulatory Visit: Payer: Self-pay | Admitting: Family Medicine

## 2013-06-21 ENCOUNTER — Other Ambulatory Visit: Payer: Self-pay | Admitting: *Deleted

## 2013-06-21 ENCOUNTER — Telehealth: Payer: Self-pay | Admitting: *Deleted

## 2013-06-21 DIAGNOSIS — Z0000 Encounter for general adult medical examination without abnormal findings: Secondary | ICD-10-CM

## 2013-06-21 MED ORDER — DEXAMETHASONE 4 MG PO TABS: ORAL_TABLET | ORAL | Status: DC

## 2013-06-21 MED ORDER — LORAZEPAM 1 MG PO TABS: ORAL_TABLET | ORAL | Status: DC

## 2013-06-21 MED ORDER — ONDANSETRON HCL 8 MG PO TABS: ORAL_TABLET | ORAL | Status: DC

## 2013-06-21 NOTE — Telephone Encounter (Signed)
Pt called and was confirming her appt with dr. Madilyn Fireman. I told her that she was not due for her CPE until august. She wanted to thank Dr. Madilyn Fireman for all that she has done for her in her care and looks forward to seeing her soon.Erika Strong

## 2013-06-23 DIAGNOSIS — C7989 Secondary malignant neoplasm of other specified sites: Secondary | ICD-10-CM | POA: Insufficient documentation

## 2013-06-26 ENCOUNTER — Other Ambulatory Visit: Payer: Self-pay | Admitting: Oncology

## 2013-06-26 ENCOUNTER — Ambulatory Visit (HOSPITAL_BASED_OUTPATIENT_CLINIC_OR_DEPARTMENT_OTHER): Payer: 59

## 2013-06-26 VITALS — BP 132/57 | HR 65 | Temp 98.7°F | Resp 18

## 2013-06-26 DIAGNOSIS — C549 Malignant neoplasm of corpus uteri, unspecified: Secondary | ICD-10-CM

## 2013-06-26 DIAGNOSIS — C541 Malignant neoplasm of endometrium: Secondary | ICD-10-CM

## 2013-06-26 DIAGNOSIS — Z5111 Encounter for antineoplastic chemotherapy: Secondary | ICD-10-CM

## 2013-06-26 MED ORDER — FAMOTIDINE IN NACL 20-0.9 MG/50ML-% IV SOLN
20.0000 mg | Freq: Once | INTRAVENOUS | Status: AC
  Administered 2013-06-26: 20 mg via INTRAVENOUS

## 2013-06-26 MED ORDER — SODIUM CHLORIDE 0.9 % IV SOLN
533.0000 mg | Freq: Once | INTRAVENOUS | Status: AC
  Administered 2013-06-26: 530 mg via INTRAVENOUS
  Filled 2013-06-26: qty 53

## 2013-06-26 MED ORDER — SODIUM CHLORIDE 0.9 % IV SOLN
Freq: Once | INTRAVENOUS | Status: AC
  Administered 2013-06-26: 08:00:00 via INTRAVENOUS

## 2013-06-26 MED ORDER — DIPHENHYDRAMINE HCL 12.5 MG/5ML PO LIQD: 12.5000 mg | Freq: Once | ORAL | Status: DC

## 2013-06-26 MED ORDER — PACLITAXEL CHEMO INJECTION 300 MG/50ML
175.0000 mg/m2 | Freq: Once | INTRAVENOUS | Status: AC
  Administered 2013-06-26: 366 mg via INTRAVENOUS
  Filled 2013-06-26: qty 61

## 2013-06-26 MED ORDER — DIPHENHYDRAMINE HCL 50 MG/ML IJ SOLN
12.5000 mg | Freq: Once | INTRAMUSCULAR | Status: DC
Start: 2013-06-26 — End: 2013-06-26

## 2013-06-26 MED ORDER — ONDANSETRON 16 MG/50ML IVPB (CHCC)
INTRAVENOUS | Status: AC
  Filled 2013-06-26: qty 16

## 2013-06-26 MED ORDER — DIPHENHYDRAMINE HCL 50 MG/ML IJ SOLN
INTRAMUSCULAR | Status: AC
  Filled 2013-06-26: qty 1

## 2013-06-26 MED ORDER — ONDANSETRON 16 MG/50ML IVPB (CHCC)
16.0000 mg | Freq: Once | INTRAVENOUS | Status: AC
  Administered 2013-06-26: 16 mg via INTRAVENOUS

## 2013-06-26 MED ORDER — DEXAMETHASONE SODIUM PHOSPHATE 20 MG/5ML IJ SOLN
INTRAMUSCULAR | Status: AC
  Filled 2013-06-26: qty 5

## 2013-06-26 MED ORDER — FAMOTIDINE IN NACL 20-0.9 MG/50ML-% IV SOLN
INTRAVENOUS | Status: AC
  Filled 2013-06-26: qty 50

## 2013-06-26 MED ORDER — DIPHENHYDRAMINE HCL 12.5 MG/5ML PO ELIX
12.5000 mg | ORAL_SOLUTION | Freq: Once | ORAL | Status: AC
  Administered 2013-06-26: 12.5 mg via ORAL
  Filled 2013-06-26: qty 5

## 2013-06-26 MED ORDER — DEXAMETHASONE SODIUM PHOSPHATE 20 MG/5ML IJ SOLN
20.0000 mg | Freq: Once | INTRAMUSCULAR | Status: AC
  Administered 2013-06-26: 20 mg via INTRAVENOUS

## 2013-06-26 NOTE — Patient Instructions (Signed)
[facility] Discharge Instructions for Patients Receiving Chemotherapy  Today you received the following chemotherapy agents: Taxol and Carboplatin   To help prevent nausea and vomiting after your treatment, we encourage you to take your nausea medication as prescribed.    If you develop nausea and vomiting that is not controlled by your nausea medication, call the clinic.   BELOW ARE SYMPTOMS THAT SHOULD BE REPORTED IMMEDIATELY:  *FEVER GREATER THAN 100.5 F  *CHILLS WITH OR WITHOUT FEVER  NAUSEA AND VOMITING THAT IS NOT CONTROLLED WITH YOUR NAUSEA MEDICATION  *UNUSUAL SHORTNESS OF BREATH  *UNUSUAL BRUISING OR BLEEDING  TENDERNESS IN MOUTH AND THROAT WITH OR WITHOUT PRESENCE OF ULCERS  *URINARY PROBLEMS  *BOWEL PROBLEMS  UNUSUAL RASH Items with * indicate a potential emergency and should be followed up as soon as possible.  Feel free to call the clinic you have any questions or concerns. The clinic phone number is [phone].   Paclitaxel injection What is this medicine? PACLITAXEL (PAK li TAX el) is a chemotherapy drug. It targets fast dividing cells, like cancer cells, and causes these cells to die. This medicine is used to treat ovarian cancer, breast cancer, and other cancers. This medicine may be used for other purposes; ask your health care provider or pharmacist if you have questions. COMMON BRAND NAME(S): Onxol , Taxol What should I tell my health care provider before I take this medicine? They need to know if you have any of these conditions: -blood disorders -irregular heartbeat -infection (especially a virus infection such as chickenpox, cold sores, or herpes) -liver disease -previous or ongoing radiation therapy -an unusual or allergic reaction to paclitaxel, alcohol, polyoxyethylated castor oil, other chemotherapy agents, other medicines, foods, dyes, or preservatives -pregnant or trying to get pregnant -breast-feeding How should I use  this medicine? This drug is given as an infusion into a vein. It is administered in a hospital or clinic by a specially trained health care professional. Talk to your pediatrician regarding the use of this medicine in children. Special care may be needed. Overdosage: If you think you have taken too much of this medicine contact a poison control center or emergency room at once. NOTE: This medicine is only for you. Do not share this medicine with others. What if I miss a dose? It is important not to miss your dose. Call your doctor or health care professional if you are unable to keep an appointment. What may interact with this medicine? Do not take this medicine with any of the following medications: -disulfiram -metronidazole This medicine may also interact with the following medications: -cyclosporine -diazepam -ketoconazole -medicines to increase blood counts like filgrastim, pegfilgrastim, sargramostim -other chemotherapy drugs like cisplatin, doxorubicin, epirubicin, etoposide, teniposide, vincristine -quinidine -testosterone -vaccines -verapamil Talk to your doctor or health care professional before taking any of these medicines: -acetaminophen -aspirin -ibuprofen -ketoprofen -naproxen This list may not describe all possible interactions. Give your health care provider a list of all the medicines, herbs, non-prescription drugs, or dietary supplements you use. Also tell them if you smoke, drink alcohol, or use illegal drugs. Some items may interact with your medicine. What should I watch for while using this medicine? Your condition will be monitored carefully while you are receiving this medicine. You will need important blood work done while you are taking this medicine. This drug may make you feel generally unwell. This is not uncommon, as chemotherapy can affect healthy cells as well as cancer cells. Report any side effects.  Continue your course of treatment even though you  feel ill unless your doctor tells you to stop. In some cases, you may be given additional medicines to help with side effects. Follow all directions for their use. Call your doctor or health care professional for advice if you get a fever, chills or sore throat, or other symptoms of a cold or flu. Do not treat yourself. This drug decreases your body's ability to fight infections. Try to avoid being around people who are sick. This medicine may increase your risk to bruise or bleed. Call your doctor or health care professional if you notice any unusual bleeding. Be careful brushing and flossing your teeth or using a toothpick because you may get an infection or bleed more easily. If you have any dental work done, tell your dentist you are receiving this medicine. Avoid taking products that contain aspirin, acetaminophen, ibuprofen, naproxen, or ketoprofen unless instructed by your doctor. These medicines may hide a fever. Do not become pregnant while taking this medicine. Women should inform their doctor if they wish to become pregnant or think they might be pregnant. There is a potential for serious side effects to an unborn child. Talk to your health care professional or pharmacist for more information. Do not breast-feed an infant while taking this medicine. Men are advised not to father a child while receiving this medicine. What side effects may I notice from receiving this medicine? Side effects that you should report to your doctor or health care professional as soon as possible: -allergic reactions like skin rash, itching or hives, swelling of the face, lips, or tongue -low blood counts - This drug may decrease the number of white blood cells, red blood cells and platelets. You may be at increased risk for infections and bleeding. -signs of infection - fever or chills, cough, sore throat, pain or difficulty passing urine -signs of decreased platelets or bleeding - bruising, pinpoint red spots on  the skin, black, tarry stools, nosebleeds -signs of decreased red blood cells - unusually weak or tired, fainting spells, lightheadedness -breathing problems -chest pain -high or low blood pressure -mouth sores -nausea and vomiting -pain, swelling, redness or irritation at the injection site -pain, tingling, numbness in the hands or feet -slow or irregular heartbeat -swelling of the ankle, feet, hands Side effects that usually do not require medical attention (report to your doctor or health care professional if they continue or are bothersome): -bone pain -complete hair loss including hair on your head, underarms, pubic hair, eyebrows, and eyelashes -changes in the color of fingernails -diarrhea -loosening of the fingernails -loss of appetite -muscle or joint pain -red flush to skin -sweating This list may not describe all possible side effects. Call your doctor for medical advice about side effects. You may report side effects to FDA at 1-800-FDA-1088. Where should I keep my medicine? This drug is given in a hospital or clinic and will not be stored at home. NOTE: This sheet is a summary. It may not cover all possible information. If you have questions about this medicine, talk to your doctor, pharmacist, or health care provider.  2014, [employer]vier/Gold Standard. (2012-04-03 16:41:21) Carboplatin injection What is this medicine? CARBOPLATIN (KAR boe pla tin) is a chemotherapy drug. It targets fast dividing cells, like cancer cells, and causes these cells to die. This medicine is used to treat ovarian cancer and many other cancers. This medicine may be used for other purposes; ask your health care provider or pharmacist if you  have questions. COMMON BRAND NAME(S): Paraplatin What should I tell my health care provider before I take this medicine? They need to know if you have any of these conditions: -blood disorders -hearing problems -kidney disease -recent or ongoing radiation  therapy -an unusual or allergic reaction to carboplatin, cisplatin, other chemotherapy, other medicines, foods, dyes, or preservatives -pregnant or trying to get pregnant -breast-feeding How should I use this medicine? This drug is usually given as an infusion into a vein. It is administered in a hospital or clinic by a specially trained health care professional. Talk to your pediatrician regarding the use of this medicine in children. Special care may be needed. Overdosage: If you think you have taken too much of this medicine contact a poison control center or emergency room at once. NOTE: This medicine is only for you. Do not share this medicine with others. What if I miss a dose? It is important not to miss a dose. Call your doctor or health care professional if you are unable to keep an appointment. What may interact with this medicine? -medicines for seizures -medicines to increase blood counts like filgrastim, pegfilgrastim, sargramostim -some antibiotics like amikacin, gentamicin, neomycin, streptomycin, tobramycin -vaccines Talk to your doctor or health care professional before taking any of these medicines: -acetaminophen -aspirin -ibuprofen -ketoprofen -naproxen This list may not describe all possible interactions. Give your health care provider a list of all the medicines, herbs, non-prescription drugs, or dietary supplements you use. Also tell them if you smoke, drink alcohol, or use illegal drugs. Some items may interact with your medicine. What should I watch for while using this medicine? Your condition will be monitored carefully while you are receiving this medicine. You will need important blood work done while you are taking this medicine. This drug may make you feel generally unwell. This is not uncommon, as chemotherapy can affect healthy cells as well as cancer cells. Report any side effects. Continue your course of treatment even though you feel ill unless your doctor  tells you to stop. In some cases, you may be given additional medicines to help with side effects. Follow all directions for their use. Call your doctor or health care professional for advice if you get a fever, chills or sore throat, or other symptoms of a cold or flu. Do not treat yourself. This drug decreases your body's ability to fight infections. Try to avoid being around people who are sick. This medicine may increase your risk to bruise or bleed. Call your doctor or health care professional if you notice any unusual bleeding. Be careful brushing and flossing your teeth or using a toothpick because you may get an infection or bleed more easily. If you have any dental work done, tell your dentist you are receiving this medicine. Avoid taking products that contain aspirin, acetaminophen, ibuprofen, naproxen, or ketoprofen unless instructed by your doctor. These medicines may hide a fever. Do not become pregnant while taking this medicine. Women should inform their doctor if they wish to become pregnant or think they might be pregnant. There is a potential for serious side effects to an unborn child. Talk to your health care professional or pharmacist for more information. Do not breast-feed an infant while taking this medicine. What side effects may I notice from receiving this medicine? Side effects that you should report to your doctor or health care professional as soon as possible: -allergic reactions like skin rash, itching or hives, swelling of the face, lips, or tongue -signs of  infection - fever or chills, cough, sore throat, pain or difficulty passing urine -signs of decreased platelets or bleeding - bruising, pinpoint red spots on the skin, black, tarry stools, nosebleeds -signs of decreased red blood cells - unusually weak or tired, fainting spells, lightheadedness -breathing problems -changes in hearing -changes in vision -chest pain -high blood pressure -low blood counts - This  drug may decrease the number of white blood cells, red blood cells and platelets. You may be at increased risk for infections and bleeding. -nausea and vomiting -pain, swelling, redness or irritation at the injection site -pain, tingling, numbness in the hands or feet -problems with balance, talking, walking -trouble passing urine or change in the amount of urine Side effects that usually do not require medical attention (report to your doctor or health care professional if they continue or are bothersome): -hair loss -loss of appetite -metallic taste in the mouth or changes in taste This list may not describe all possible side effects. Call your doctor for medical advice about side effects. You may report side effects to FDA at 1-800-FDA-1088. Where should I keep my medicine? This drug is given in a hospital or clinic and will not be stored at home. NOTE: This sheet is a summary. It may not cover all possible information. If you have questions about this medicine, talk to your doctor, pharmacist, or health care provider.  2014, [employer]vier/Gold Standard. (2007-05-16 14:38:05)

## 2013-06-26 NOTE — Progress Notes (Signed)
Patient reports distant history of benadryl allergy where she noted heart palpitations when using oral benadryl as an antihistamine. Patient has not taken Benadryl since this incident more than 25 years ago. Dr. Marko Plume notified of this. Per MD, patient to receive po 12.5 mg benadryl premed for day 1 cycle 1 taxol/carbo. Per MD, in the event of an allergic reaction, RN to follow hypersensitivity protocol. Pharmacy aware.   Patient received taxol and carboplatin without difficulty or complaints. VSS throughout. PIV intact until d/c home. Patient ambulated to lobby without assistance, stable condition, no acute distress with AVS.

## 2013-06-27 ENCOUNTER — Telehealth: Payer: Self-pay | Admitting: *Deleted

## 2013-06-27 NOTE — Telephone Encounter (Signed)
Message copied by Cherylynn Ridges on Wed Jun 27, 2013 11:38 AM ------      Message from: Alla Feeling      Created: Tue Jun 26, 2013  2:27 PM      Regarding: chemo f/u call       First dose 3 hour taxol/carbo. No issues. Tolerated well.  ------

## 2013-06-27 NOTE — Telephone Encounter (Signed)
Called Erika Strong at [phone] home number(s).  Message left requesting a return call for chemotherapy follow up.  Awaiting return call from patient.

## 2013-06-28 ENCOUNTER — Telehealth: Payer: Self-pay

## 2013-06-28 NOTE — Telephone Encounter (Signed)
Erika Strong called stating that she appreciated the follow up call yesterday.  She is doing fine.  She is experiencing some pain in her fingers in left hand.

## 2013-06-28 NOTE — Telephone Encounter (Signed)
Told  Erika Strong that the pain in her fingers ans hand can be from the Taxol.  She may experience more pain that can be very intense in the next few days in her legs and knees.  She can try tylenol or advil for the pain.

## 2013-06-29 ENCOUNTER — Other Ambulatory Visit: Payer: Self-pay

## 2013-06-29 DIAGNOSIS — C541 Malignant neoplasm of endometrium: Secondary | ICD-10-CM

## 2013-07-01 ENCOUNTER — Other Ambulatory Visit: Payer: Self-pay | Admitting: Oncology

## 2013-07-02 ENCOUNTER — Other Ambulatory Visit (HOSPITAL_BASED_OUTPATIENT_CLINIC_OR_DEPARTMENT_OTHER): Payer: 59

## 2013-07-02 ENCOUNTER — Encounter: Payer: Self-pay | Admitting: Oncology

## 2013-07-02 ENCOUNTER — Encounter: Payer: BC Managed Care – PPO | Admitting: Family Medicine

## 2013-07-02 ENCOUNTER — Ambulatory Visit (HOSPITAL_BASED_OUTPATIENT_CLINIC_OR_DEPARTMENT_OTHER): Payer: 59 | Admitting: Oncology

## 2013-07-02 VITALS — BP 128/76 | HR 183 | Temp 98.6°F | Resp 18 | Ht 67.0 in | Wt 206.0 lb

## 2013-07-02 DIAGNOSIS — C541 Malignant neoplasm of endometrium: Secondary | ICD-10-CM

## 2013-07-02 DIAGNOSIS — C7989 Secondary malignant neoplasm of other specified sites: Secondary | ICD-10-CM

## 2013-07-02 DIAGNOSIS — K5730 Diverticulosis of large intestine without perforation or abscess without bleeding: Secondary | ICD-10-CM

## 2013-07-02 DIAGNOSIS — C549 Malignant neoplasm of corpus uteri, unspecified: Secondary | ICD-10-CM

## 2013-07-02 DIAGNOSIS — K7689 Other specified diseases of liver: Secondary | ICD-10-CM

## 2013-07-02 DIAGNOSIS — I10 Essential (primary) hypertension: Secondary | ICD-10-CM

## 2013-07-02 LAB — COMPREHENSIVE METABOLIC PANEL (CC13)
ALT: 57 U/L — AB (ref 0–55)
ANION GAP: 10 meq/L (ref 3–11)
AST: 40 U/L — ABNORMAL HIGH (ref 5–34)
Albumin: 3.6 g/dL (ref 3.5–5.0)
Alkaline Phosphatase: 52 U/L (ref 40–150)
BUN: 16.6 mg/dL (ref 7.0–26.0)
CALCIUM: 10 mg/dL (ref 8.4–10.4)
CHLORIDE: 100 meq/L (ref 98–109)
CO2: 27 meq/L (ref 22–29)
Creatinine: 0.8 mg/dL (ref 0.6–1.1)
Glucose: 106 mg/dl (ref 70–140)
Potassium: 3.8 mEq/L (ref 3.5–5.1)
Sodium: 137 mEq/L (ref 136–145)
Total Bilirubin: 0.61 mg/dL (ref 0.20–1.20)
Total Protein: 6.7 g/dL (ref 6.4–8.3)

## 2013-07-02 LAB — CBC WITH DIFFERENTIAL/PLATELET
BASO%: 0.3 % (ref 0.0–2.0)
Basophils Absolute: 0 10*3/uL (ref 0.0–0.1)
EOS%: 6.2 % (ref 0.0–7.0)
Eosinophils Absolute: 0.3 10*3/uL (ref 0.0–0.5)
HCT: 41.5 % (ref 34.8–46.6)
HEMOGLOBIN: 13.7 g/dL (ref 11.6–15.9)
LYMPH#: 1.5 10*3/uL (ref 0.9–3.3)
LYMPH%: 35.8 % (ref 14.0–49.7)
MCH: 28.7 pg (ref 25.1–34.0)
MCHC: 33.1 g/dL (ref 31.5–36.0)
MCV: 86.6 fL (ref 79.5–101.0)
MONO#: 0.1 10*3/uL (ref 0.1–0.9)
MONO%: 1.4 % (ref 0.0–14.0)
NEUT#: 2.3 10*3/uL (ref 1.5–6.5)
NEUT%: 56.3 % (ref 38.4–76.8)
PLATELETS: 165 10*3/uL (ref 145–400)
RBC: 4.79 10*6/uL (ref 3.70–5.45)
RDW: 13.9 % (ref 11.2–14.5)
WBC: 4.1 10*3/uL (ref 3.9–10.3)

## 2013-07-02 NOTE — Progress Notes (Signed)
OFFICE PROGRESS NOTE   07/02/2013   Physicians:Brewster, Abigail Butts  CC: Beatrice Lecher; Darlis Loan ([facility])   INTERVAL HISTORY:  Patient is seen, together with husband, now having had cycle 1  taxol carboplatin on 06-26-13 for endometrial cancer recurrent in pelvis; she has not had gCSF. She has tolerated the chemotherapy very well to this point, with some fatigue on day 2, minimal nausea resolved with antiemetics and some taxol aches in legs and fingers that required no intervention. She was somewhat lightheaded on standing at times and held usual antihypertensive when BP was 109/64. She had history of possible intolerance to benadryl, but tolerated po 12.5 mg with premed for chemo without difficulty, and thinks now that the heart palpitations were from Shafer (includes phenylephrine). She is willing to receive appropriate dose of benadryl with future treatments.  Peripheral IV access was easily accomplished.    ONCOLOGIC HISTORY Oncology History   IA FIGO grade I endometrial carcinoma diagnosed 09-2010, treated with [facility] BSO pelvic nodes 11-24-2010 . Recurrent in pelvis 04-2013 confirmed by biopsy     Endometrial cancer   11/02/2010 Initial Diagnosis Endometrial cancer  History goes back to 07-2010 when she presented with vaginal spotting. Endometrial biopsy by Dr Clovia Cuff 10-21-10 revealed endometroid carcinoma grade 1 focally involving polyp. She had robotic assisted total laparoscopic hysterectomy BSO and 20 pelvic node evaluation by Dr Skeet Latch on 11-24-2010. Pathology [phone]) had endometrial adenocarcinoma Grade I with focal squamous differentiation, superficial myometrial invasion ( 0.2 cm where myometrium 1.4 cm); benign cervix, tubes and ovaries; no LVSI. With no high risk features, she was followed with observation only, including exams by gyn oncology every 6 months.  In early March 2015 she had acute onset of lower abdominal pain, then fever and nausea over next  several days. She was treated by PCP for presumed diverticulitis with cipro and flagyl, initially with improvement then symptoms recurrent in late March. She had CT AP 05-23-13 showed a 4.1x4.1 cm mass left anterior upper pelvis, and CT biopsy 05-31-13 of mesenteric mass at level of umbilicus showed metastatic adenocarcinoma consistent with endometrioid carcinoma. Patient saw Dr Skeet Latch at [facility] on 06-08-13, with exam remarkable for palpable right pelvic mass and recommendation for 3 cycles of taxane / platin chemotherapy, then repeat imaging; may also consider radiation after chemotherapy. The initial abdominal pain resolved entirely.   Review of systems as above, also: Taste ok, appetite ok. Slightly constipated, better with prune juice. No bleeding. No abdominal or pelvic pain. No LE swelling.  Remainder of 10 point Review of Systems negative.  Objective:  Vital signs in last 24 hours:  BP 128/76  Pulse 183  Temp(Src) 98.6 F (37 C) (Oral)  Resp 18  Ht 5\' 7"  (1.702 m)  Wt 206 lb (93.441 kg)  BMI 32.26 kg/m2  Alert, oriented and appropriate. Ambulatory without difficulty.  No alopecia  HEENT:PERRL, sclerae not icteric. Oral mucosa moist without lesions, posterior pharynx clear.  Neck supple. No JVD.  Lymphatics:no cervical,suraclavicular, axillary or inguinal adenopathy Resp: clear to auscultation bilaterally and normal percussion bilaterally Cardio: regular rate and rhythm. No gallop. GI: soft, nontender, not distended, no mass or organomegaly. Normally active bowel sounds. Surgical incisions not remarkable. Musculoskeletal/ Extremities: without pitting edema, cords, tenderness Neuro: no peripheral neuropathy. Otherwise nonfocal Skin without rash, ecchymosis, petechiae  Lab Results:  Results for orders placed in visit on 07/02/13  CBC WITH DIFFERENTIAL      Result Value Ref Range   WBC 4.1  3.9 -  10.3 10e3/uL   NEUT# 2.3  1.5 - 6.5 10e3/uL   HGB 13.7  11.6 - 15.9 g/dL   HCT 41.5   34.8 - 46.6 %   Platelets 165  145 - 400 10e3/uL   MCV 86.6  79.5 - 101.0 fL   MCH 28.7  25.1 - 34.0 pg   MCHC 33.1  31.5 - 36.0 g/dL   RBC 4.79  3.70 - 5.45 10e6/uL   RDW 13.9  11.2 - 14.5 %   lymph# 1.5  0.9 - 3.3 10e3/uL   MONO# 0.1  0.1 - 0.9 10e3/uL   Eosinophils Absolute 0.3  0.0 - 0.5 10e3/uL   Basophils Absolute 0.0  0.0 - 0.1 10e3/uL   NEUT% 56.3  38.4 - 76.8 %   LYMPH% 35.8  14.0 - 49.7 %   MONO% 1.4  0.0 - 14.0 %   EOS% 6.2  0.0 - 7.0 %   BASO% 0.3  0.0 - 2.0 %  COMPREHENSIVE METABOLIC PANEL (YQ65)      Result Value Ref Range   Sodium 137  136 - 145 mEq/L   Potassium 3.8  3.5 - 5.1 mEq/L   Chloride 100  98 - 109 mEq/L   CO2 27  22 - 29 mEq/L   Glucose 106  70 - 140 mg/dl   BUN 16.6  7.0 - 26.0 mg/dL   Creatinine 0.8  0.6 - 1.1 mg/dL   Total Bilirubin 0.61  0.20 - 1.20 mg/dL   Alkaline Phosphatase 52  40 - 150 U/L   AST 40 (*) 5 - 34 U/L   ALT 57 (*) 0 - 55 U/L   Total Protein 6.7  6.4 - 8.3 g/dL   Albumin 3.6  3.5 - 5.0 g/dL   Calcium 10.0  8.4 - 10.4 mg/dL   Anion Gap 10  3 - 11 mEq/L     Studies/Results:  No results found.  Medications: I have reviewed the patient's current medications. She will need decadron refill prior to next chemo. To hold antihypertensive Hygroton if sys <120.  DISCUSSION: patient and husband aware that counts may decrease in next week. As next MD appointment if Mon 5-18, she will let us know if particularly fatigued by 5-15, in which case she would need CBC also on 5-15. She plans to stay out of crowds at least until counts checked next.   Assessment/Plan: 1.Endometrial carcinoma initially IA grade 1 at surgery 11-2010, now with multifocal recurrence in pelvis. Chemotherapy with taxol and carboplatin begun 06-26-13, planning 3 cycles then repeat imaging. I will see her with counts 07-09-13 and she will be due cycle 2 on 07-18-13. 2.HTN: BP lower on Hygroton for last 2-3 years. I have told her to hold if systolic <784. She is to increase  po fluids. 3.fatty liver and obesity with BMI 32  4.diverticulosis  5. Yearly mammograms scheduled for 07-03-13 6.slight elevation in AST and ALT on chemistries now: follow up next labs.  Patient and husband are comfortable with discussion and plan, and know to call prior to scheduled appointment if needed.     Gordy Levan, MD   07/02/2013, 11:36 AM

## 2013-07-03 ENCOUNTER — Ambulatory Visit (INDEPENDENT_AMBULATORY_CARE_PROVIDER_SITE_OTHER): Payer: 59

## 2013-07-03 DIAGNOSIS — Z0000 Encounter for general adult medical examination without abnormal findings: Secondary | ICD-10-CM

## 2013-07-03 DIAGNOSIS — Z1231 Encounter for screening mammogram for malignant neoplasm of breast: Secondary | ICD-10-CM

## 2013-07-08 ENCOUNTER — Other Ambulatory Visit: Payer: Self-pay | Admitting: Oncology

## 2013-07-09 ENCOUNTER — Encounter: Payer: Self-pay | Admitting: Oncology

## 2013-07-09 ENCOUNTER — Other Ambulatory Visit (HOSPITAL_BASED_OUTPATIENT_CLINIC_OR_DEPARTMENT_OTHER): Payer: 59

## 2013-07-09 ENCOUNTER — Telehealth: Payer: Self-pay | Admitting: Oncology

## 2013-07-09 ENCOUNTER — Ambulatory Visit (HOSPITAL_BASED_OUTPATIENT_CLINIC_OR_DEPARTMENT_OTHER): Payer: 59 | Admitting: Oncology

## 2013-07-09 ENCOUNTER — Other Ambulatory Visit: Payer: Self-pay | Admitting: *Deleted

## 2013-07-09 VITALS — BP 148/82 | HR 71 | Temp 98.8°F | Resp 18 | Ht 67.0 in | Wt 210.4 lb

## 2013-07-09 DIAGNOSIS — R5383 Other fatigue: Secondary | ICD-10-CM

## 2013-07-09 DIAGNOSIS — C7989 Secondary malignant neoplasm of other specified sites: Secondary | ICD-10-CM

## 2013-07-09 DIAGNOSIS — D709 Neutropenia, unspecified: Secondary | ICD-10-CM

## 2013-07-09 DIAGNOSIS — C541 Malignant neoplasm of endometrium: Secondary | ICD-10-CM

## 2013-07-09 DIAGNOSIS — C549 Malignant neoplasm of corpus uteri, unspecified: Secondary | ICD-10-CM

## 2013-07-09 DIAGNOSIS — R5381 Other malaise: Secondary | ICD-10-CM

## 2013-07-09 DIAGNOSIS — I10 Essential (primary) hypertension: Secondary | ICD-10-CM

## 2013-07-09 LAB — CBC WITH DIFFERENTIAL/PLATELET
BASO%: 1.4 % (ref 0.0–2.0)
BASOS ABS: 0 10*3/uL (ref 0.0–0.1)
EOS ABS: 0.2 10*3/uL (ref 0.0–0.5)
EOS%: 6.9 % (ref 0.0–7.0)
HEMATOCRIT: 39.1 % (ref 34.8–46.6)
HEMOGLOBIN: 12.8 g/dL (ref 11.6–15.9)
LYMPH#: 1.8 10*3/uL (ref 0.9–3.3)
LYMPH%: 61.5 % — ABNORMAL HIGH (ref 14.0–49.7)
MCH: 29 pg (ref 25.1–34.0)
MCHC: 32.7 g/dL (ref 31.5–36.0)
MCV: 88.7 fL (ref 79.5–101.0)
MONO#: 0.8 10*3/uL (ref 0.1–0.9)
MONO%: 29.2 % — ABNORMAL HIGH (ref 0.0–14.0)
NEUT%: 1 % — AB (ref 38.4–76.8)
NEUTROS ABS: 0 10*3/uL — AB (ref 1.5–6.5)
Platelets: 265 10*3/uL (ref 145–400)
RBC: 4.41 10*6/uL (ref 3.70–5.45)
RDW: 14.1 % (ref 11.2–14.5)
WBC: 2.9 10*3/uL — ABNORMAL LOW (ref 3.9–10.3)

## 2013-07-09 LAB — COMPREHENSIVE METABOLIC PANEL (CC13)
ALT: 27 U/L (ref 0–55)
ANION GAP: 11 meq/L (ref 3–11)
AST: 20 U/L (ref 5–34)
Albumin: 3.5 g/dL (ref 3.5–5.0)
Alkaline Phosphatase: 56 U/L (ref 40–150)
BUN: 8.6 mg/dL (ref 7.0–26.0)
CALCIUM: 9.3 mg/dL (ref 8.4–10.4)
CHLORIDE: 104 meq/L (ref 98–109)
CO2: 24 meq/L (ref 22–29)
CREATININE: 0.8 mg/dL (ref 0.6–1.1)
GLUCOSE: 88 mg/dL (ref 70–140)
POTASSIUM: 4.4 meq/L (ref 3.5–5.1)
Sodium: 139 mEq/L (ref 136–145)
Total Bilirubin: 0.36 mg/dL (ref 0.20–1.20)
Total Protein: 6.5 g/dL (ref 6.4–8.3)

## 2013-07-09 LAB — TECHNOLOGIST REVIEW

## 2013-07-09 MED ORDER — CIPROFLOXACIN HCL 250 MG PO TABS: 250.0000 mg | ORAL_TABLET | Freq: Two times a day (BID) | ORAL | Status: DC

## 2013-07-09 MED ORDER — LORATADINE 10 MG PO TABS: 10.0000 mg | ORAL_TABLET | Freq: Every day | ORAL | Status: DC

## 2013-07-09 MED ORDER — DEXAMETHASONE 4 MG PO TABS: ORAL_TABLET | ORAL | Status: DC

## 2013-07-09 MED ORDER — FILGRASTIM 480 MCG/0.8ML IJ SOLN: 480.0000 ug | Freq: Once | INTRAMUSCULAR | Status: DC

## 2013-07-09 MED ORDER — TBO-FILGRASTIM 480 MCG/0.8ML ~~LOC~~ SOSY
480.0000 ug | PREFILLED_SYRINGE | Freq: Once | SUBCUTANEOUS | Status: AC
  Administered 2013-07-09: 480 ug via SUBCUTANEOUS
  Filled 2013-07-09: qty 0.8

## 2013-07-09 NOTE — Telephone Encounter (Signed)
Reviewed neutropenic precautions with patient

## 2013-07-09 NOTE — Progress Notes (Signed)
OFFICE PROGRESS NOTE   07/09/2013   Physicians:Brewster, Canary Brim, Lorri Frederick, Louie Casa ([facility])   INTERVAL HISTORY:  Patient is seen, together with husband, in continuing attention to treatment for endometrial carcinoma recurrent to pelvis, having had cycle 1 carbo taxol on 06-26-13. She has had had gCSF as yet, but is neutropenic today (ANC 0.0, day 14 cycle 1).She has had no fever and no symptoms of infection. She was very fatigued end of last week, but did not let us know.  She has otherwise done well with the first chemotherapy, with no significant nausea or vomiting, some taxol aches especially lower legs beginning day 3 for ~ 2 days, no peripheral neuropathy, bowels moving daily, no bleeding. She denies abdominal or pelvic discomfort. She has been staying at home and has been checking temperature twice daily.  She does not have PAC.    ONCOLOGIC HISTORY Oncology History   [place] FIGO grade I endometrial carcinoma diagnosed 09-2010, treated with [place] BSO pelvic nodes 11-24-2010 . Recurrent in pelvis 04-2013 confirmed by biopsy     Endometrial cancer   11/02/2010 Initial Diagnosis Endometrial cancer  History goes back to 07-2010 when she presented with vaginal spotting. Endometrial biopsy by Dr Clovia Cuff 10-21-10 revealed endometroid carcinoma grade 1 focally involving polyp. She had robotic assisted total laparoscopic hysterectomy BSO and 20 pelvic node evaluation by Dr Skeet Latch on 11-24-2010. Pathology [id number]) had endometrial adenocarcinoma Grade I with focal squamous differentiation, superficial myometrial invasion ( 0.2 cm where myometrium 1.4 cm); benign cervix, tubes and ovaries; no LVSI. With no high risk features, she was followed with observation only, including exams by gyn oncology every 6 months.  In early March 2015 she had acute onset of lower abdominal pain, then fever and nausea over next several days. She was treated by PCP for presumed diverticulitis with  cipro and flagyl, initially with improvement then symptoms recurrent in late March. She had CT AP 05-23-13 showed a 4.1x4.1 cm mass left anterior upper pelvis, and CT biopsy 05-31-13 of mesenteric mass at level of umbilicus showed metastatic adenocarcinoma consistent with endometrioid carcinoma. Patient saw Dr Skeet Latch at [facility] on 06-08-13, with exam remarkable for palpable right pelvic mass and recommendation for 3 cycles of taxane / platin chemotherapy, then repeat imaging; may also consider radiation after chemotherapy. The initial abdominal pain resolved entirely.   Review of systems as above, also: No LE swelling. No bladder symptoms. No SOB. Appetite good. Generally sleeps well. Remainder of 10 point Review of Systems negative.  Objective:  Vital signs in last 24 hours:  BP 148/82  Pulse 71  Temp(Src) 98.8 F (37.1 C) (Oral)  Resp 18  Ht 5\' 7"  (1.702 m)  Wt 210 lb 6.4 oz (95.437 kg)  BMI 32.95 kg/m2  SpO2 98% Weight up 4.5 lbs. Alert, oriented and appropriate. Ambulatory without difficulty.  No alopecia  HEENT:PERRL, sclerae not icteric. Oral mucosa moist without lesions, posterior pharynx clear.  Neck supple. No JVD.  Lymphatics:no cervical,suraclavicular or inguinal adenopathy Resp: clear to auscultation bilaterally and normal percussion bilaterally Cardio: regular rate and rhythm. No gallop. GI: soft, nontender, not distended, no mass or organomegaly. Normally active bowel sounds. Surgical incision not remarkable. Musculoskeletal/ Extremities: without pitting edema, cords, tenderness Neuro: no peripheral neuropathy. Otherwise nonfocal. PSYCH appropriate mood and affect Skin without rash, ecchymosis, petechiae   Lab Results:  Results for orders placed in visit on 07/09/13  TECHNOLOGIST REVIEW      Result Value Ref Range   Technologist Review  rare myelocyte, occ Giant platelets    CBC WITH DIFFERENTIAL      Result Value Ref Range   WBC 2.9 (*) 3.9 - 10.3 10e3/uL   NEUT#  0.0 (*) 1.5 - 6.5 10e3/uL   HGB 12.8  11.6 - 15.9 g/dL   HCT 39.1  34.8 - 46.6 %   Platelets 265  145 - 400 10e3/uL   MCV 88.7  79.5 - 101.0 fL   MCH 29.0  25.1 - 34.0 pg   MCHC 32.7  31.5 - 36.0 g/dL   RBC 4.41  3.70 - 5.45 10e6/uL   RDW 14.1  11.2 - 14.5 %   lymph# 1.8  0.9 - 3.3 10e3/uL   MONO# 0.8  0.1 - 0.9 10e3/uL   Eosinophils Absolute 0.2  0.0 - 0.5 10e3/uL   Basophils Absolute 0.0  0.0 - 0.1 10e3/uL   NEUT% 1.0 (*) 38.4 - 76.8 %   LYMPH% 61.5 (*) 14.0 - 49.7 %   MONO% 29.2 (*) 0.0 - 14.0 %   EOS% 6.9  0.0 - 7.0 %   BASO% 1.4  0.0 - 2.0 %  COMPREHENSIVE METABOLIC PANEL (AC16)      Result Value Ref Range   Sodium 139  136 - 145 mEq/L   Potassium 4.4  3.5 - 5.1 mEq/L   Chloride 104  98 - 109 mEq/L   CO2 24  22 - 29 mEq/L   Glucose 88  70 - 140 mg/dl   BUN 8.6  7.0 - 26.0 mg/dL   Creatinine 0.8  0.6 - 1.1 mg/dL   Total Bilirubin 0.36  0.20 - 1.20 mg/dL   Alkaline Phosphatase 56  40 - 150 U/L   AST 20  5 - 34 U/L   ALT 27  0 - 55 U/L   Total Protein 6.5  6.4 - 8.3 g/dL   Albumin 3.5  3.5 - 5.0 g/dL   Calcium 9.3  8.4 - 10.4 mg/dL   Anion Gap 11  3 - 11 mEq/L     Studies/Results:   Medications: I have reviewed the patient's current medications. Neupogen 480 mg given today and will be repeated on 5-19, then CBC 5-20 and further neuupogen if ANC <1.2. Begin Cipro due to [place] 0, thru duration of neutropenia. She can try claritin for gCSF aches (and for taxol aches)  DISCUSSION: Neutropenic precautions discussed carefully by MD and RN, with patient and husband aware to call for temp >=100.5 or symptoms of infection. As platelets have already recovered, she is likely past actual nadir and should recover WBC quickly. They are aware that she may have bone aches from gCSF if counts rapidly increase. Will add gCSF earlier for subsequent cycles.Note she has never had pelvic radiation. Will also recheck CBC day prior to chemo next week and let her know if counts ok for treatment/ if  ok to proceed with premedication decadron (ok to treat if ANC >=1.5 and plt >=100k)  Assessment/Plan: 1.Endometrial carcinoma initially IA grade 1 at surgery 11-2010, now with multifocal recurrence in pelvis. Chemotherapy with taxol and carboplatin begun 06-26-13, planning 3 cycles then repeat imaging.  2.chemo induced neutropenia: ANC 0 day 14 cycle 1, with day 14 past nadir based on platelets. Neupogen begun at 480 mg today, planned 5-19 then recheck counts on 5-20 and continue if ANC <1.2. Neutropenic precautions. Cipro due to severity of neutropenia.  Repeat CBC day prior to planned cycle 2 next week - parameters above. She needs neulasta ~ day  4 cycle 2. I will see her 7-10 days after that treatment. 3.HTN: BP lower on [place] for last 2-3 years, now holding if systolic <665.   4.fatty liver and obesity with BMI 32  5. Yearly mammograms scheduled for 07-03-13  6.slight elevation in AST and ALT on chemistries initially: resolved by labs today 7.diverticulosis    Patient and husband followed discussion well and understand plan. They are encouraged to call at any time prior to next visit if needed.   Gordy Levan, MD   07/09/2013, 9:04 PM

## 2013-07-09 NOTE — Telephone Encounter (Signed)
, °

## 2013-07-09 NOTE — Patient Instructions (Signed)
Stay away from anyone who might have contagious illness while white blood count is low. You will have shot for white blood cells today and on 5-19, then we will check CBC again on 5-20 to see if you need any more shots. Take claritin (lortadine) 10mg  today and daily with the shots, as it may help prevent aching.  Claritin is over the counter for allergies, but can help these aches and taxol aches, so ok to take it also for a few days after each chemo. We will send prescription for antibiotic Cipro to your pharmacy, one tablet every 12 hours until white blood cells are back up.  Call [phone] if temperature is >=100.5 while white blood count is low, or if you have symptoms of infection.

## 2013-07-10 ENCOUNTER — Ambulatory Visit (HOSPITAL_BASED_OUTPATIENT_CLINIC_OR_DEPARTMENT_OTHER): Payer: 59

## 2013-07-10 ENCOUNTER — Ambulatory Visit (HOSPITAL_BASED_OUTPATIENT_CLINIC_OR_DEPARTMENT_OTHER): Payer: 59 | Admitting: Oncology

## 2013-07-10 ENCOUNTER — Other Ambulatory Visit: Payer: Self-pay | Admitting: Oncology

## 2013-07-10 ENCOUNTER — Telehealth: Payer: Self-pay | Admitting: Oncology

## 2013-07-10 ENCOUNTER — Encounter: Payer: Self-pay | Admitting: Oncology

## 2013-07-10 VITALS — BP 150/65 | HR 83 | Temp 100.6°F

## 2013-07-10 DIAGNOSIS — C541 Malignant neoplasm of endometrium: Secondary | ICD-10-CM

## 2013-07-10 DIAGNOSIS — R110 Nausea: Secondary | ICD-10-CM

## 2013-07-10 DIAGNOSIS — C7989 Secondary malignant neoplasm of other specified sites: Secondary | ICD-10-CM

## 2013-07-10 DIAGNOSIS — C779 Secondary and unspecified malignant neoplasm of lymph node, unspecified: Secondary | ICD-10-CM

## 2013-07-10 DIAGNOSIS — E669 Obesity, unspecified: Secondary | ICD-10-CM

## 2013-07-10 DIAGNOSIS — I10 Essential (primary) hypertension: Secondary | ICD-10-CM

## 2013-07-10 DIAGNOSIS — R5081 Fever presenting with conditions classified elsewhere: Secondary | ICD-10-CM

## 2013-07-10 DIAGNOSIS — D701 Agranulocytosis secondary to cancer chemotherapy: Secondary | ICD-10-CM

## 2013-07-10 DIAGNOSIS — D702 Other drug-induced agranulocytosis: Secondary | ICD-10-CM

## 2013-07-10 DIAGNOSIS — C50919 Malignant neoplasm of unspecified site of unspecified female breast: Secondary | ICD-10-CM

## 2013-07-10 DIAGNOSIS — R509 Fever, unspecified: Secondary | ICD-10-CM

## 2013-07-10 DIAGNOSIS — K7689 Other specified diseases of liver: Secondary | ICD-10-CM

## 2013-07-10 DIAGNOSIS — C549 Malignant neoplasm of corpus uteri, unspecified: Secondary | ICD-10-CM

## 2013-07-10 DIAGNOSIS — T451X5A Adverse effect of antineoplastic and immunosuppressive drugs, initial encounter: Secondary | ICD-10-CM

## 2013-07-10 LAB — CBC WITH DIFFERENTIAL/PLATELET
BASO%: 1.1 % (ref 0.0–2.0)
Basophils Absolute: 0.1 10*3/uL (ref 0.0–0.1)
EOS ABS: 0.2 10*3/uL (ref 0.0–0.5)
EOS%: 3.8 % (ref 0.0–7.0)
HCT: 40.4 % (ref 34.8–46.6)
HGB: 13.1 g/dL (ref 11.6–15.9)
LYMPH%: 30 % (ref 14.0–49.7)
MCH: 28.7 pg (ref 25.1–34.0)
MCHC: 32.4 g/dL (ref 31.5–36.0)
MCV: 88.4 fL (ref 79.5–101.0)
MONO#: 1.9 10*3/uL — AB (ref 0.1–0.9)
MONO%: 31.2 % — ABNORMAL HIGH (ref 0.0–14.0)
NEUT#: 2.1 10*3/uL (ref 1.5–6.5)
NEUT%: 33.9 % — AB (ref 38.4–76.8)
Platelets: 221 10*3/uL (ref 145–400)
RBC: 4.57 10*6/uL (ref 3.70–5.45)
RDW: 14.4 % (ref 11.2–14.5)
WBC: 6.1 10*3/uL (ref 3.9–10.3)
lymph#: 1.8 10*3/uL (ref 0.9–3.3)

## 2013-07-10 MED ORDER — FILGRASTIM 480 MCG/0.8ML IJ SOLN
480.0000 ug | Freq: Once | INTRAMUSCULAR | Status: AC
  Administered 2013-07-10: 480 ug via SUBCUTANEOUS
  Filled 2013-07-10: qty 0.8

## 2013-07-10 NOTE — Patient Instructions (Signed)
No more shots now, as white blood cells are up. Take claritin today and at least tomorrow. Take tylenol when you get home today.  Call if you have temperature >101 or if you have shaking chills or clear symptoms of infection. Continue Cipro. [phone]

## 2013-07-10 NOTE — Progress Notes (Signed)
OFFICE PROGRESS NOTE   07/10/2013   Physicians:B[place]ster, Canary Brim, Lorri Frederick, Louie Casa ([facility])   INTERVAL HISTORY:  Patient is seen, together with husband, for an unscheduled visit, as she was markedly neutropenic yesterday then febrile this AM when arrived at office for second neupogen injection. First cycle taxol carboplatin was 06-26-13, without gCSF until neupogen begun 07-09-13 for ANC 0 (day 14 cycle 1). She received gCSF prior to order for repeat CBC now. She was not febrile including at home this AM, then was 100.6 on arrival to [facility] and repeat 101.1, without chills or localizing symptoms of infection. She slept poorly last pm due to new low back and sternal pain during night, consistent with gCSF; she did take claritin (without D) 1600 yesterday.  She denies upper or lower respiratory symptoms, esophagitis, urinary symptoms. She had normal BM today. She had mild nausea this AM, drank fluids but did not eat, feels better post antiemetic now.  She does not have PAC.   ONCOLOGIC HISTORY Oncology History   [place] FIGO grade I endometrial carcinoma diagnosed 09-2010, treated with [place] BSO pelvic nodes 11-24-2010 . Recurrent in pelvis 04-2013 confirmed by biopsy     Endometrial cancer   11/02/2010 Initial Diagnosis Endometrial cancer  tory goes back to 07-2010 when she presented with vaginal spotting. Endometrial biopsy by Dr Clovia Cuff 10-21-10 revealed endometroid carcinoma grade 1 focally involving polyp. She had robotic assisted total laparoscopic hysterectomy BSO and 20 pelvic node evaluation by Dr Skeet Latch on 11-24-2010. Pathology [phone]) had endometrial adenocarcinoma Grade I with focal squamous differentiation, superficial myometrial invasion ( 0.2 cm where myometrium 1.4 cm); benign cervix, tubes and ovaries; no LVSI. With no high risk features, she was followed with observation only, including exams by gyn oncology every 6 months.  In early March 2015 she had acute  onset of lower abdominal pain, then fever and nausea over next several days. She was treated by PCP for presumed diverticulitis with cipro and flagyl, initially with improvement then symptoms recurrent in late March. She had CT AP 05-23-13 showed a 4.1x4.1 cm mass left anterior upper pelvis, and CT biopsy 05-31-13 of mesenteric mass at level of umbilicus showed metastatic adenocarcinoma consistent with endometrioid carcinoma. Patient saw Dr Skeet Latch at [facility] on 06-08-13, with exam remarkable for palpable right pelvic mass and recommendation for 3 cycles of taxane / platin chemotherapy, then repeat imaging; may also consider radiation after chemotherapy. The initial abdominal pain resolved entirely. Cycle 1 taxol carboplatin was given 06-26-2013; she was neutropenic with ANC 0 on day 14.   Review of systems as above, also: No LE swelling. No abdominal or pelvic pain. No low back or sternal pain now. Remainder of 10 point Review of Systems negative.  Objective:  Vital signs in last 24 hours:  Temp 101.1  150/65, 83 regular, 18 not labored RA. Alert, oriented and appropriate. Ambulatory without assistance difficulty.  Looks tired but not toxic, not acutely ill. No chills.  HEENT:PERRL, sclerae not icteric. Oral mucosa moist without lesions, posterior pharynx clear.  Neck supple. No JVD.  Lymphatics:no cervical,suraclavicular adenopathy Resp: clear to auscultation bilaterally and normal percussion bilaterally Cardio: regular rate and rhythm. No gallop. GI: soft, nontender, not distended. Normally active bowel sounds.  Musculoskeletal/ Extremities: without pitting edema, cords, tenderness Neuro: no peripheral neuropathy. Otherwise nonfocal Skin without rash, ecchymosis, petechiae   Lab Results:  Results for orders placed in visit on 07/10/13  CBC WITH DIFFERENTIAL      Result Value Ref Range  WBC 6.1  3.9 - 10.3 10e3/uL   NEUT# 2.1  1.5 - 6.5 10e3/uL   HGB 13.1  11.6 - 15.9 g/dL   HCT 40.4  34.8  - 46.6 %   Platelets 221  145 - 400 10e3/uL   MCV 88.4  79.5 - 101.0 fL   MCH 28.7  25.1 - 34.0 pg   MCHC 32.4  31.5 - 36.0 g/dL   RBC 4.57  3.70 - 5.45 10e6/uL   RDW 14.4  11.2 - 14.5 %   lymph# 1.8  0.9 - 3.3 10e3/uL   MONO# 1.9 (*) 0.1 - 0.9 10e3/uL   Eosinophils Absolute 0.2  0.0 - 0.5 10e3/uL   Basophils Absolute 0.1  0.0 - 0.1 10e3/uL   NEUT% 33.9 (*) 38.4 - 76.8 %   LYMPH% 30.0  14.0 - 49.7 %   MONO% 31.2 (*) 0.0 - 14.0 %   EOS% 3.8  0.0 - 7.0 %   BASO% 1.1  0.0 - 2.0 %       Studies/Results:  No results found.  Medications: I have reviewed the patient's current medications. She does not need gCSF after today for this cycle. She will complete the cipro begun on 5-18. She will take claritin when she returns home this AM and at least tomorrow also, as we expect further gCSF aches. She will have neulasta after subsequent chemo treatments (~ day 4-5 as taxol aches worst day 3 first cycle). She will take tylenol when she returns home today.  DISCUSSION: discussed recovery of WBC and associated aches. Fever today may be related to the gCSF, but will continue empiric cipro and will call if >=101, shaking chills or localizing symptoms of infection.  Message to [facility] financial staff for [employer].  Assessment/Plan:  1.Endometrial carcinoma initially IA grade 1 at surgery 11-2010, now with multifocal recurrence in pelvis. Chemotherapy with taxol and carboplatin begun 06-26-13, planning 3 cycles then repeat imaging.  2.chemo induced neutropenia: ANC 0 day 14 cycle 1, with day 14 past nadir based on platelets. Neupogen 480 mg given 5-18 and 5-19. ANC out of neutropenic range today. Fever this am but as not neutropenic will manage outpatient as above. Repeat CBC day prior to planned cycle 2 next week - parameters above. She needs neulasta ~ day 4 cycle 2. I will see her 7-10 days after that treatment.  3.HTN: BP lower on [place] for last 2-3 years, now holding if systolic <505.   4.fatty liver and obesity with BMI 32  5. Yearly mammograms scheduled for 07-03-13  6.slight elevation in AST and ALT on chemistries initially: resolved by labs today  7.diverticulosis   Patient and husband understood discussion and are in agreement with plan. They know to call if problems as above or other concerns. They express appreciation for care.  TIme spent 25 min including >50% counseling and coordination of care.  Gordy Levan, MD   07/10/2013, 12:01 PM

## 2013-07-10 NOTE — Progress Notes (Signed)
Erika Strong here for Granix injection.  Temp today 100.6 up to 101.1.  Is taking Cipro as ordered yesterday and is drinking fluids.  Called Dr Marko Plume.  Orders to get CBC today put in computer by Dr Marko Plume.   Patient will stay until we get lab results.

## 2013-07-10 NOTE — Telephone Encounter (Signed)
gv pt appt schedule for may/june °

## 2013-07-11 ENCOUNTER — Other Ambulatory Visit: Payer: 59

## 2013-07-12 ENCOUNTER — Ambulatory Visit: Payer: 59 | Admitting: Oncology

## 2013-07-17 ENCOUNTER — Other Ambulatory Visit: Payer: Self-pay | Admitting: *Deleted

## 2013-07-17 ENCOUNTER — Other Ambulatory Visit (HOSPITAL_BASED_OUTPATIENT_CLINIC_OR_DEPARTMENT_OTHER): Payer: 59

## 2013-07-17 ENCOUNTER — Telehealth: Payer: Self-pay | Admitting: Oncology

## 2013-07-17 DIAGNOSIS — C549 Malignant neoplasm of corpus uteri, unspecified: Secondary | ICD-10-CM

## 2013-07-17 DIAGNOSIS — C7989 Secondary malignant neoplasm of other specified sites: Secondary | ICD-10-CM

## 2013-07-17 LAB — CBC WITH DIFFERENTIAL/PLATELET
BASO%: 0.9 % (ref 0.0–2.0)
Basophils Absolute: 0.1 10*3/uL (ref 0.0–0.1)
EOS%: 1 % (ref 0.0–7.0)
Eosinophils Absolute: 0.1 10*3/uL (ref 0.0–0.5)
HCT: 42.1 % (ref 34.8–46.6)
HGB: 13.7 g/dL (ref 11.6–15.9)
LYMPH%: 18.5 % (ref 14.0–49.7)
MCH: 28.7 pg (ref 25.1–34.0)
MCHC: 32.6 g/dL (ref 31.5–36.0)
MCV: 87.9 fL (ref 79.5–101.0)
MONO#: 2 10*3/uL — ABNORMAL HIGH (ref 0.1–0.9)
MONO%: 14.7 % — ABNORMAL HIGH (ref 0.0–14.0)
NEUT#: 8.7 10*3/uL — ABNORMAL HIGH (ref 1.5–6.5)
NEUT%: 64.9 % (ref 38.4–76.8)
PLATELETS: 136 10*3/uL — AB (ref 145–400)
RBC: 4.79 10*6/uL (ref 3.70–5.45)
RDW: 14.8 % — ABNORMAL HIGH (ref 11.2–14.5)
WBC: 13.4 10*3/uL — ABNORMAL HIGH (ref 3.9–10.3)
lymph#: 2.5 10*3/uL (ref 0.9–3.3)

## 2013-07-17 NOTE — Telephone Encounter (Signed)
Medical Oncology  LM home and mobile that counts are good today, no change in plan for tomorrow.  L.Livesay MD

## 2013-07-17 NOTE — Telephone Encounter (Signed)
Called patient and reminded to take decadron

## 2013-07-18 ENCOUNTER — Ambulatory Visit (HOSPITAL_BASED_OUTPATIENT_CLINIC_OR_DEPARTMENT_OTHER): Payer: 59

## 2013-07-18 VITALS — BP 129/64 | HR 79 | Temp 98.2°F | Resp 18

## 2013-07-18 DIAGNOSIS — Z5111 Encounter for antineoplastic chemotherapy: Secondary | ICD-10-CM

## 2013-07-18 DIAGNOSIS — C549 Malignant neoplasm of corpus uteri, unspecified: Secondary | ICD-10-CM

## 2013-07-18 DIAGNOSIS — C541 Malignant neoplasm of endometrium: Secondary | ICD-10-CM

## 2013-07-18 MED ORDER — SODIUM CHLORIDE 0.9 % IJ SOLN
10.0000 mL | INTRAMUSCULAR | Status: DC | PRN
  Filled 2013-07-18: qty 10

## 2013-07-18 MED ORDER — DIPHENHYDRAMINE HCL 12.5 MG/5ML PO LIQD: 12.5000 mg | Freq: Once | ORAL | Status: DC

## 2013-07-18 MED ORDER — ONDANSETRON 16 MG/50ML IVPB (CHCC)
16.0000 mg | Freq: Once | INTRAVENOUS | Status: AC
  Administered 2013-07-18: 16 mg via INTRAVENOUS

## 2013-07-18 MED ORDER — ONDANSETRON 16 MG/50ML IVPB (CHCC)
INTRAVENOUS | Status: AC
  Filled 2013-07-18: qty 16

## 2013-07-18 MED ORDER — DIPHENHYDRAMINE HCL 50 MG/ML IJ SOLN
INTRAMUSCULAR | Status: AC
  Filled 2013-07-18: qty 1

## 2013-07-18 MED ORDER — SODIUM CHLORIDE 0.9 % IV SOLN
Freq: Once | INTRAVENOUS | Status: AC
  Administered 2013-07-18: 13:00:00 via INTRAVENOUS

## 2013-07-18 MED ORDER — PROMETHAZINE HCL 25 MG/ML IJ SOLN
25.0000 mg | Freq: Once | INTRAMUSCULAR | Status: DC
  Filled 2013-07-18: qty 1

## 2013-07-18 MED ORDER — DEXTROSE 5 % IV SOLN
175.0000 mg/m2 | Freq: Once | INTRAVENOUS | Status: AC
  Administered 2013-07-18: 366 mg via INTRAVENOUS
  Filled 2013-07-18: qty 61

## 2013-07-18 MED ORDER — FAMOTIDINE IN NACL 20-0.9 MG/50ML-% IV SOLN
INTRAVENOUS | Status: AC
  Filled 2013-07-18: qty 50

## 2013-07-18 MED ORDER — HEPARIN SOD (PORK) LOCK FLUSH 100 UNIT/ML IV SOLN
500.0000 [IU] | Freq: Once | INTRAVENOUS | Status: DC | PRN
  Filled 2013-07-18: qty 5

## 2013-07-18 MED ORDER — CARBOPLATIN CHEMO INJECTION 600 MG/60ML
430.0000 mg | Freq: Once | INTRAVENOUS | Status: AC
  Administered 2013-07-18: 430 mg via INTRAVENOUS
  Filled 2013-07-18: qty 43

## 2013-07-18 MED ORDER — DIPHENHYDRAMINE HCL 50 MG/ML IJ SOLN
12.5000 mg | Freq: Once | INTRAMUSCULAR | Status: AC
  Administered 2013-07-18: 12.5 mg via INTRAVENOUS

## 2013-07-18 MED ORDER — FAMOTIDINE IN NACL 20-0.9 MG/50ML-% IV SOLN
20.0000 mg | Freq: Once | INTRAVENOUS | Status: AC
  Administered 2013-07-18: 20 mg via INTRAVENOUS

## 2013-07-18 MED ORDER — DEXAMETHASONE SODIUM PHOSPHATE 20 MG/5ML IJ SOLN
INTRAMUSCULAR | Status: AC
  Filled 2013-07-18: qty 5

## 2013-07-18 MED ORDER — DIPHENHYDRAMINE HCL 12.5 MG/5ML PO ELIX
12.5000 mg | ORAL_SOLUTION | Freq: Once | ORAL | Status: AC
  Administered 2013-07-18: 12.5 mg via ORAL
  Filled 2013-07-18: qty 5

## 2013-07-18 MED ORDER — DEXAMETHASONE SODIUM PHOSPHATE 20 MG/5ML IJ SOLN
20.0000 mg | Freq: Once | INTRAMUSCULAR | Status: AC
  Administered 2013-07-18: 20 mg via INTRAVENOUS

## 2013-07-18 NOTE — Patient Instructions (Addendum)
[facility] Discharge Instructions for Patients Receiving Chemotherapy  Today you received the following chemotherapy agents Paclitaxel and Carboplatin.  To help prevent nausea and vomiting after your treatment, we encourage you to take your nausea medication as directed.  If you develop nausea and vomiting that is not controlled by your nausea medication, call the clinic.   BELOW ARE SYMPTOMS THAT SHOULD BE REPORTED IMMEDIATELY:  *FEVER GREATER THAN 100.5 F  *CHILLS WITH OR WITHOUT FEVER  NAUSEA AND VOMITING THAT IS NOT CONTROLLED WITH YOUR NAUSEA MEDICATION  *UNUSUAL SHORTNESS OF BREATH  *UNUSUAL BRUISING OR BLEEDING  TENDERNESS IN MOUTH AND THROAT WITH OR WITHOUT PRESENCE OF ULCERS  *URINARY PROBLEMS  *BOWEL PROBLEMS  UNUSUAL RASH Items with * indicate a potential emergency and should be followed up as soon as possible.  Feel free to call the clinic you have any questions or concerns. The clinic phone number is [phone].   Pegfilgrastim injection What is this medicine? PEGFILGRASTIM (peg fil GRA stim) helps the body make more white blood cells. It is used to prevent infection in people with low amounts of white blood cells following cancer treatment. This medicine may be used for other purposes; ask your health care provider or pharmacist if you have questions. COMMON BRAND NAME(S): Neulasta What should I tell my health care provider before I take this medicine? They need to know if you have any of these conditions: -sickle cell disease -an unusual or allergic reaction to pegfilgrastim, filgrastim, E.coli protein, other medicines, foods, dyes, or preservatives -pregnant or trying to get pregnant -breast-feeding How should I use this medicine? This medicine is for injection under the skin. It is usually given by a health care professional in a hospital or clinic setting. If you get this medicine at home, you will be taught how to prepare and give this  medicine. Do not shake this medicine. Use exactly as directed. Take your medicine at regular intervals. Do not take your medicine more often than directed. It is important that you put your used needles and syringes in a special sharps container. Do not put them in a trash can. If you do not have a sharps container, call your pharmacist or healthcare provider to get one. Talk to your pediatrician regarding the use of this medicine in children. While this drug may be prescribed for children who weigh more than 45 kg for selected conditions, precautions do apply Overdosage: If you think you have taken too much of this medicine contact a poison control center or emergency room at once. NOTE: This medicine is only for you. Do not share this medicine with others. What if I miss a dose? If you miss a dose, take it as soon as you can. If it is almost time for your next dose, take only that dose. Do not take double or extra doses. What may interact with this medicine? -lithium -medicines for growth therapy This list may not describe all possible interactions. Give your health care provider a list of all the medicines, herbs, non-prescription drugs, or dietary supplements you use. Also tell them if you smoke, drink alcohol, or use illegal drugs. Some items may interact with your medicine. What should I watch for while using this medicine? Visit your doctor for regular check ups. You will need important blood work done while you are taking this medicine. What side effects may I notice from receiving this medicine? Side effects that you should report to your doctor or health care professional  as soon as possible: -allergic reactions like skin rash, itching or hives, swelling of the face, lips, or tongue -breathing problems -fever -pain, redness, or swelling where injected -shoulder pain -stomach or side pain Side effects that usually do not require medical attention (report to your doctor or health care  professional if they continue or are bothersome): -aches, pains -headache -loss of appetite -nausea, vomiting -unusually tired This list may not describe all possible side effects. Call your doctor for medical advice about side effects. You may report side effects to FDA at 1-800-FDA-1088. Where should I keep my medicine? Keep out of the reach of children. Store in a refrigerator between 2 and 8 degrees C (36 and 46 degrees F). Do not freeze. Keep in carton to protect from light. Throw away this medicine if it is left out of the refrigerator for more than 48 hours. Throw away any unused medicine after the expiration date. NOTE: This sheet is a summary. It may not cover all possible information. If you have questions about this medicine, talk to your doctor, pharmacist, or health care provider.  2014, Elsevier/Gold Standard. (2007-09-11 15:41:44)

## 2013-07-19 ENCOUNTER — Encounter: Payer: Self-pay | Admitting: *Deleted

## 2013-07-21 ENCOUNTER — Ambulatory Visit (HOSPITAL_BASED_OUTPATIENT_CLINIC_OR_DEPARTMENT_OTHER): Payer: 59

## 2013-07-21 VITALS — BP 147/79 | HR 86 | Temp 97.8°F | Resp 18

## 2013-07-21 DIAGNOSIS — C549 Malignant neoplasm of corpus uteri, unspecified: Secondary | ICD-10-CM

## 2013-07-21 DIAGNOSIS — Z5189 Encounter for other specified aftercare: Secondary | ICD-10-CM

## 2013-07-21 MED ORDER — PEGFILGRASTIM INJECTION 6 MG/0.6ML
6.0000 mg | Freq: Once | SUBCUTANEOUS | Status: AC
  Administered 2013-07-21: 6 mg via SUBCUTANEOUS

## 2013-07-21 NOTE — Patient Instructions (Signed)
Pegfilgrastim injection (Neulasta) What is this medicine? PEGFILGRASTIM (peg fil GRA stim) helps the body make more white blood cells. It is used to prevent infection in people with low amounts of white blood cells following cancer treatment. This medicine may be used for other purposes; ask your health care provider or pharmacist if you have questions. COMMON BRAND NAME(S): Neulasta What should I tell my health care provider before I take this medicine? They need to know if you have any of these conditions: -sickle cell disease -an unusual or allergic reaction to pegfilgrastim, filgrastim, E.coli protein, other medicines, foods, dyes, or preservatives -pregnant or trying to get pregnant -breast-feeding How should I use this medicine? This medicine is for injection under the skin. It is usually given by a health care professional in a hospital or clinic setting. If you get this medicine at home, you will be taught how to prepare and give this medicine. Do not shake this medicine. Use exactly as directed. Take your medicine at regular intervals. Do not take your medicine more often than directed. It is important that you put your used needles and syringes in a special sharps container. Do not put them in a trash can. If you do not have a sharps container, call your pharmacist or healthcare provider to get one. Talk to your pediatrician regarding the use of this medicine in children. While this drug may be prescribed for children who weigh more than 45 kg for selected conditions, precautions do apply Overdosage: If you think you have taken too much of this medicine contact a poison control center or emergency room at once. NOTE: This medicine is only for you. Do not share this medicine with others. What if I miss a dose? If you miss a dose, take it as soon as you can. If it is almost time for your next dose, take only that dose. Do not take double or extra doses. What may interact with this  medicine? -lithium -medicines for growth therapy This list may not describe all possible interactions. Give your health care provider a list of all the medicines, herbs, non-prescription drugs, or dietary supplements you use. Also tell them if you smoke, drink alcohol, or use illegal drugs. Some items may interact with your medicine. What should I watch for while using this medicine? Visit your doctor for regular check ups. You will need important blood work done while you are taking this medicine. What side effects may I notice from receiving this medicine? Side effects that you should report to your doctor or health care professional as soon as possible: -allergic reactions like skin rash, itching or hives, swelling of the face, lips, or tongue -breathing problems -fever -pain, redness, or swelling where injected -shoulder pain -stomach or side pain Side effects that usually do not require medical attention (report to your doctor or health care professional if they continue or are bothersome): -aches, pains -headache -loss of appetite -nausea, vomiting -unusually tired This list may not describe all possible side effects. Call your doctor for medical advice about side effects. You may report side effects to FDA at 1-800-FDA-1088. Where should I keep my medicine? Keep out of the reach of children. Store in a refrigerator between 2 and 8 degrees C (36 and 46 degrees F). Do not freeze. Keep in carton to protect from light. Throw away this medicine if it is left out of the refrigerator for more than 48 hours. Throw away any unused medicine after the expiration date. NOTE: This sheet   is a summary. It may not cover all possible information. If you have questions about this medicine, talk to your doctor, pharmacist, or health care provider.  2014, Elsevier/Gold Standard. (2007-09-11 15:41:44)  

## 2013-07-23 ENCOUNTER — Telehealth: Payer: Self-pay | Admitting: *Deleted

## 2013-07-23 NOTE — Telephone Encounter (Signed)
Pt called to say her pulse rate has been increased over the last few days. Normally in 70's or 80's, but yesterday was 107, 105, 92, 100. Today 77, 80, 85, 101, 88, 99, and 94. BP 134/66 today so she took her BP med. Denies nausea or vomiting, no diarrhea. Just wanted to see if Dr Marko Plume thinks this is unusual.

## 2013-07-23 NOTE — Telephone Encounter (Signed)
Instructed patient to push po fluids. Denies SOB or chest pain. Will call us if has any problems

## 2013-07-26 ENCOUNTER — Encounter: Payer: Self-pay | Admitting: *Deleted

## 2013-07-29 ENCOUNTER — Other Ambulatory Visit: Payer: Self-pay | Admitting: Oncology

## 2013-07-29 DIAGNOSIS — C541 Malignant neoplasm of endometrium: Secondary | ICD-10-CM

## 2013-07-30 ENCOUNTER — Telehealth: Payer: Self-pay | Admitting: *Deleted

## 2013-07-30 ENCOUNTER — Encounter: Payer: Self-pay | Admitting: Oncology

## 2013-07-30 ENCOUNTER — Other Ambulatory Visit (HOSPITAL_BASED_OUTPATIENT_CLINIC_OR_DEPARTMENT_OTHER): Payer: 59

## 2013-07-30 ENCOUNTER — Encounter: Payer: Self-pay | Admitting: *Deleted

## 2013-07-30 ENCOUNTER — Ambulatory Visit (HOSPITAL_BASED_OUTPATIENT_CLINIC_OR_DEPARTMENT_OTHER): Payer: 59 | Admitting: Oncology

## 2013-07-30 ENCOUNTER — Telehealth: Payer: Self-pay | Admitting: Oncology

## 2013-07-30 VITALS — BP 147/76 | HR 73 | Temp 98.5°F | Resp 18 | Ht 67.0 in | Wt 207.2 lb

## 2013-07-30 DIAGNOSIS — C549 Malignant neoplasm of corpus uteri, unspecified: Secondary | ICD-10-CM

## 2013-07-30 DIAGNOSIS — B001 Herpesviral vesicular dermatitis: Secondary | ICD-10-CM

## 2013-07-30 DIAGNOSIS — E669 Obesity, unspecified: Secondary | ICD-10-CM

## 2013-07-30 DIAGNOSIS — D702 Other drug-induced agranulocytosis: Secondary | ICD-10-CM

## 2013-07-30 DIAGNOSIS — T451X5A Adverse effect of antineoplastic and immunosuppressive drugs, initial encounter: Secondary | ICD-10-CM

## 2013-07-30 DIAGNOSIS — C541 Malignant neoplasm of endometrium: Secondary | ICD-10-CM

## 2013-07-30 DIAGNOSIS — K7689 Other specified diseases of liver: Secondary | ICD-10-CM

## 2013-07-30 LAB — CBC WITH DIFFERENTIAL/PLATELET
BASO%: 0.5 % (ref 0.0–2.0)
BASOS ABS: 0.2 10*3/uL — AB (ref 0.0–0.1)
EOS%: 2.3 % (ref 0.0–7.0)
Eosinophils Absolute: 0.9 10*3/uL — ABNORMAL HIGH (ref 0.0–0.5)
HCT: 38.4 % (ref 34.8–46.6)
HEMOGLOBIN: 12.4 g/dL (ref 11.6–15.9)
LYMPH%: 9.2 % — AB (ref 14.0–49.7)
MCH: 28.5 pg (ref 25.1–34.0)
MCHC: 32.3 g/dL (ref 31.5–36.0)
MCV: 88.2 fL (ref 79.5–101.0)
MONO#: 1.2 10*3/uL — ABNORMAL HIGH (ref 0.1–0.9)
MONO%: 3.1 % (ref 0.0–14.0)
NEUT#: 32.9 10*3/uL — ABNORMAL HIGH (ref 1.5–6.5)
NEUT%: 84.9 % — ABNORMAL HIGH (ref 38.4–76.8)
PLATELETS: 150 10*3/uL (ref 145–400)
RBC: 4.35 10*6/uL (ref 3.70–5.45)
RDW: 15.7 % — AB (ref 11.2–14.5)
WBC: 38.8 10*3/uL — AB (ref 3.9–10.3)
lymph#: 3.6 10*3/uL — ABNORMAL HIGH (ref 0.9–3.3)

## 2013-07-30 LAB — COMPREHENSIVE METABOLIC PANEL (CC13)
ALBUMIN: 3.4 g/dL — AB (ref 3.5–5.0)
ALK PHOS: 104 U/L (ref 40–150)
ALT: 22 U/L (ref 0–55)
ANION GAP: 14 meq/L — AB (ref 3–11)
AST: 29 U/L (ref 5–34)
BUN: 7.1 mg/dL (ref 7.0–26.0)
CO2: 26 meq/L (ref 22–29)
Calcium: 9.5 mg/dL (ref 8.4–10.4)
Chloride: 101 mEq/L (ref 98–109)
Creatinine: 0.7 mg/dL (ref 0.6–1.1)
Glucose: 98 mg/dl (ref 70–140)
POTASSIUM: 3.1 meq/L — AB (ref 3.5–5.1)
SODIUM: 140 meq/L (ref 136–145)
TOTAL PROTEIN: 6.2 g/dL — AB (ref 6.4–8.3)
Total Bilirubin: 0.29 mg/dL (ref 0.20–1.20)

## 2013-07-30 MED ORDER — ACYCLOVIR 5 % EX OINT: TOPICAL_OINTMENT | CUTANEOUS | Status: DC

## 2013-07-30 NOTE — Progress Notes (Signed)
OFFICE PROGRESS NOTE   07/30/2013   Physicians:Brewster, Canary Brim, Lorri Frederick, Louie Casa ([facility])   INTERVAL HISTORY:  Patient is seen, together with husband, in continuing attention to chemotherapy in process for endometrial cancer recurrent to pelvis. She had first cycle of taxol and carboplatin on 06-26-2013 and was neutropenic with ANC of 0 on day 14; she had cycle 2 on 07-18-13 with neulasta on 07-21-13. Plan is to repeat scans and see Dr Skeet Latch again after cycle 3.  Patient had more aches in legs after chemo + neulasta, not significantly improved with claritin, lasted 3-4 days and have now resolved entirely. She had some constipation on day 5 improved with more prune juice; we have discussed adding senokot S or miralax around chemo if that is more helpful. She had slight numbness in toes for a few days, now resolved entirely, none in fingers. She has been able to eat and is drinking fluids well "a gallon a day". Energy has been good for past 4 days, "weak and tired" prior to that.   She does not have PAC   ONCOLOGIC HISTORY Oncology History   [place] FIGO grade I endometrial carcinoma diagnosed 09-2010, treated with [place] BSO pelvic nodes 11-24-2010 . Recurrent in pelvis 04-2013 confirmed by biopsy     Endometrial cancer   11/02/2010 Initial Diagnosis Endometrial cancer  History goes back to 07-2010 when she presented with vaginal spotting. Endometrial biopsy by Dr Clovia Cuff 10-21-10 revealed endometroid carcinoma grade 1 focally involving polyp. She had robotic assisted total laparoscopic hysterectomy BSO and 20 pelvic node evaluation by Dr Skeet Latch on 11-24-2010. Pathology [phone]) had endometrial adenocarcinoma Grade I with focal squamous differentiation, superficial myometrial invasion ( 0.2 cm where myometrium 1.4 cm); benign cervix, tubes and ovaries; no LVSI. With no high risk features, she was followed with observation only, including exams by gyn oncology every 6  months.  In early March 2015 she had acute onset of lower abdominal pain, then fever and nausea over next several days. She was treated by PCP for presumed diverticulitis with cipro and flagyl, initially with improvement then symptoms recurrent in late March. She had CT AP 05-23-13 showed a 4.1x4.1 cm mass left anterior upper pelvis, and CT biopsy 05-31-13 of mesenteric mass at level of umbilicus showed metastatic adenocarcinoma consistent with endometrioid carcinoma. Patient saw Dr Skeet Latch at [facility] on 06-08-13, with exam remarkable for palpable right pelvic mass and recommendation for 3 cycles of taxane / platin chemotherapy, then repeat imaging; may also consider radiation after chemotherapy. The initial abdominal pain resolved entirely. Cycle 1 taxol carboplatin was given 06-26-2013; she was neutropenic with ANC 0 on day 14.   Review of systems as above, also: No nausea. Fever blister on upper lip, improving. No other pain. Bladder ok. No SOB or other respiratory symptoms. Peripheral IV access ok last treatment. Remainder of 10 point Review of Systems negative.  Objective:  Vital signs in last 24 hours:  BP 147/76  Pulse 73  Temp(Src) 98.5 F (36.9 C) (Oral)  Resp 18  Ht 5\' 7"  (1.702 m)  Wt 207 lb 3.2 oz (93.985 kg)  BMI 32.44 kg/m2 weight is down 3 lbs.  Alert, oriented and appropriate. Ambulatory without difficulty.  Alopecia  HEENT:PERRL, sclerae not icteric. Oral mucosa moist without lesions, posterior pharynx clear.  Neck supple. No JVD.  Lymphatics:no cervical,suraclavicular or inguinal adenopathy Resp: clear to auscultation bilaterally and normal percussion bilaterally Cardio: regular rate and rhythm. No gallop. GI: abdomen soft, nontender, not distended,  no mass or organomegaly. Normally active bowel sounds. Surgical incision not remarkable. Musculoskeletal/ Extremities: without pitting edema, cords, tenderness Neuro: no peripheral neuropathy. Otherwise nonfocal Skin without rash,  ecchymosis, petechiae   Lab Results:  Results for orders placed in visit on 07/30/13  CBC WITH DIFFERENTIAL      Result Value Ref Range   WBC 38.8 (*) 3.9 - 10.3 10e3/uL   NEUT# 32.9 (*) 1.5 - 6.5 10e3/uL   HGB 12.4  11.6 - 15.9 g/dL   HCT 38.4  34.8 - 46.6 %   Platelets 150  145 - 400 10e3/uL   MCV 88.2  79.5 - 101.0 fL   MCH 28.5  25.1 - 34.0 pg   MCHC 32.3  31.5 - 36.0 g/dL   RBC 4.35  3.70 - 5.45 10e6/uL   RDW 15.7 (*) 11.2 - 14.5 %   lymph# 3.6 (*) 0.9 - 3.3 10e3/uL   MONO# 1.2 (*) 0.1 - 0.9 10e3/uL   Eosinophils Absolute 0.9 (*) 0.0 - 0.5 10e3/uL   Basophils Absolute 0.2 (*) 0.0 - 0.1 10e3/uL   NEUT% 84.9 (*) 38.4 - 76.8 %   LYMPH% 9.2 (*) 14.0 - 49.7 %   MONO% 3.1  0.0 - 14.0 %   EOS% 2.3  0.0 - 7.0 %   BASO% 0.5  0.0 - 2.0 %  COMPREHENSIVE METABOLIC PANEL (NT61)      Result Value Ref Range   Sodium 140  136 - 145 mEq/L   Potassium 3.1 (*) 3.5 - 5.1 mEq/L   Chloride 101  98 - 109 mEq/L   CO2 26  22 - 29 mEq/L   Glucose 98  70 - 140 mg/dl   BUN 7.1  7.0 - 26.0 mg/dL   Creatinine 0.7  0.6 - 1.1 mg/dL   Total Bilirubin 0.29  0.20 - 1.20 mg/dL   Alkaline Phosphatase 104  40 - 150 U/L   AST 29  5 - 34 U/L   ALT 22  0 - 55 U/L   Total Protein 6.2 (*) 6.4 - 8.3 g/dL   Albumin 3.4 (*) 3.5 - 5.0 g/dL   Calcium 9.5  8.4 - 10.4 mg/dL   Anion Gap 14 (*) 3 - 11 mEq/L    Chemistries resulted after visit. Studies/Results:  No results found.  Medications: I have reviewed the patient's current medications. WIll add KCl 10 mEq daily. Zovirax ointment to fever blisters 5x daily.  DISCUSSION: discussed good response of WBC to neulasta and plan for repeat scans/ visit back to gyn oncology after cycle 3.  Assessment/Plan: 1.Endometrial carcinoma initially IA grade 1 at surgery 11-2010, now with multifocal recurrence in pelvis. Cycle 3 taxol carbo due June 17 as long as ANC >=1.5 and plt >=100k by labs 6-16. She will have neulasta ~ day 2. I will see her back 6-29 + lab, then  CT AP shortly prior to seeing Dr Skeet Latch on July 9. 2.chemo induced neutropenia (ANC 0 day 14 cycle1) managed with neulasta 3.HTN: BP lower on [place] for last 2-3 years, now holding if systolic <443.  4.fatty liver and obesity with BMI 32  5. Yearly mammograms scheduled for 07-03-13  6.slight elevation in AST and ALT on chemistries initially: resolved by labs today  7.diverticulosis    Patient and husband understand plan and are in agreement.    Gordy Levan, MD   07/30/2013, 9:12 AM

## 2013-07-30 NOTE — Progress Notes (Signed)
Called [facility] about acyclovir 5% 15gm pa; per the rep this does not require precert, it needs to be written 15gm for 15 days or 30gm for 30 days.

## 2013-07-30 NOTE — Telephone Encounter (Signed)
Per staff message and POF I have scheduled appts.  JMW  

## 2013-07-30 NOTE — Telephone Encounter (Signed)
per of to sch appt/sent MW email to sch trmts-printed Z& gave pt sch-will call wth trmts-Dr Skeet Latch schd as well & inj

## 2013-07-30 NOTE — Patient Instructions (Signed)
Senokot S   1-2 tablets once or twice daily to prevent constipation. Could use starting day of chemo or day after chemo if you want. Natural vegetable laxative + stool softener.  Or can use miralax/ glycolox  1 capful daily mixed in 8 oz any liquid   Plain claritin, not Claritin D

## 2013-07-30 NOTE — Progress Notes (Signed)
RECEIVED A FAX FROM [facility] CONCERNING A PRIOR AUTHORIZATION FOR ACYCLOVIR 5% OINTMENT. THIS REQUEST WAS PLACED IN THE MANAGED CARE BIN.

## 2013-07-31 ENCOUNTER — Other Ambulatory Visit: Payer: Self-pay | Admitting: Oncology

## 2013-07-31 ENCOUNTER — Telehealth: Payer: Self-pay

## 2013-07-31 DIAGNOSIS — E876 Hypokalemia: Secondary | ICD-10-CM

## 2013-07-31 DIAGNOSIS — C541 Malignant neoplasm of endometrium: Secondary | ICD-10-CM

## 2013-07-31 MED ORDER — POTASSIUM CHLORIDE ER 10 MEQ PO TBCR: 10.0000 meq | EXTENDED_RELEASE_TABLET | Freq: Every day | ORAL | Status: DC

## 2013-07-31 NOTE — Telephone Encounter (Signed)
Told Erika Strong the result of her KCL level as noted below by Dr. Marko Plume.  Erika Strong verbalized understanding. Sent KCL prescription to her pharmacy.

## 2013-07-31 NOTE — Telephone Encounter (Signed)
Message copied by Baruch Merl on Tue Jul 31, 2013 11:00 AM ------      Message from: Gordy Levan      Created: Tue Jul 31, 2013  6:44 AM       Labs seen and need follow up: please let her know K a little low at 3.1. Increase in diet + add KCl 10 mEq daily #30 ------

## 2013-08-01 ENCOUNTER — Telehealth: Payer: Self-pay | Admitting: Oncology

## 2013-08-01 NOTE — Telephone Encounter (Signed)
cld & left message w/pt LL cancel appts 7/8-per LL to cx labs as well

## 2013-08-01 NOTE — Telephone Encounter (Signed)
per pof to can rx for 7/8-sent LL an email to see if labs are to be cx too. Will await reply.

## 2013-08-07 ENCOUNTER — Telehealth: Payer: Self-pay

## 2013-08-07 ENCOUNTER — Other Ambulatory Visit (HOSPITAL_BASED_OUTPATIENT_CLINIC_OR_DEPARTMENT_OTHER): Payer: 59

## 2013-08-07 DIAGNOSIS — C549 Malignant neoplasm of corpus uteri, unspecified: Secondary | ICD-10-CM

## 2013-08-07 DIAGNOSIS — C541 Malignant neoplasm of endometrium: Secondary | ICD-10-CM

## 2013-08-07 DIAGNOSIS — I10 Essential (primary) hypertension: Secondary | ICD-10-CM

## 2013-08-07 DIAGNOSIS — E876 Hypokalemia: Secondary | ICD-10-CM

## 2013-08-07 LAB — CBC WITH DIFFERENTIAL/PLATELET
BASO%: 0.7 % (ref 0.0–2.0)
BASOS ABS: 0.1 10*3/uL (ref 0.0–0.1)
EOS%: 0.8 % (ref 0.0–7.0)
Eosinophils Absolute: 0.1 10*3/uL (ref 0.0–0.5)
HCT: 38.7 % (ref 34.8–46.6)
HEMOGLOBIN: 12.7 g/dL (ref 11.6–15.9)
LYMPH#: 2.1 10*3/uL (ref 0.9–3.3)
LYMPH%: 19 % (ref 14.0–49.7)
MCH: 29.1 pg (ref 25.1–34.0)
MCHC: 32.8 g/dL (ref 31.5–36.0)
MCV: 88.8 fL (ref 79.5–101.0)
MONO#: 1.5 10*3/uL — ABNORMAL HIGH (ref 0.1–0.9)
MONO%: 13.7 % (ref 0.0–14.0)
NEUT#: 7.4 10*3/uL — ABNORMAL HIGH (ref 1.5–6.5)
NEUT%: 65.8 % (ref 38.4–76.8)
Platelets: 202 10*3/uL (ref 145–400)
RBC: 4.36 10*6/uL (ref 3.70–5.45)
RDW: 16.9 % — AB (ref 11.2–14.5)
WBC: 11.2 10*3/uL — ABNORMAL HIGH (ref 3.9–10.3)

## 2013-08-07 LAB — COMPREHENSIVE METABOLIC PANEL (CC13)
ALBUMIN: 3.5 g/dL (ref 3.5–5.0)
ALT: 20 U/L (ref 0–55)
AST: 20 U/L (ref 5–34)
Alkaline Phosphatase: 71 U/L (ref 40–150)
Anion Gap: 10 mEq/L (ref 3–11)
BUN: 11.2 mg/dL (ref 7.0–26.0)
CALCIUM: 9.5 mg/dL (ref 8.4–10.4)
CHLORIDE: 101 meq/L (ref 98–109)
CO2: 28 mEq/L (ref 22–29)
Creatinine: 0.7 mg/dL (ref 0.6–1.1)
Glucose: 93 mg/dl (ref 70–140)
POTASSIUM: 3.2 meq/L — AB (ref 3.5–5.1)
Sodium: 139 mEq/L (ref 136–145)
TOTAL PROTEIN: 6.6 g/dL (ref 6.4–8.3)
Total Bilirubin: 0.4 mg/dL (ref 0.20–1.20)

## 2013-08-07 MED ORDER — POTASSIUM CHLORIDE ER 10 MEQ PO TBCR: 10.0000 meq | EXTENDED_RELEASE_TABLET | Freq: Two times a day (BID) | ORAL | Status: DC

## 2013-08-07 NOTE — Telephone Encounter (Signed)
Told Erika Strong that her WBC=11.2, ANC is 7.4, and platelets =202.  These counts are  fine to have  her treatment tomorrow.  She knows when to take her steroids. Told her that her KCL is still low at 3.2.  Dr. Marko Plume said to increase her KCL from 10 meq daily to 20 meq daily.  Will send in a new prescription to her pharmacy. Will add a chemistry panel to 08-20-13 visit to recheck her potassium.  Patient verbalized understanding.

## 2013-08-08 ENCOUNTER — Other Ambulatory Visit: Payer: Self-pay | Admitting: Oncology

## 2013-08-08 ENCOUNTER — Ambulatory Visit (HOSPITAL_BASED_OUTPATIENT_CLINIC_OR_DEPARTMENT_OTHER): Payer: 59

## 2013-08-08 ENCOUNTER — Other Ambulatory Visit: Payer: 59

## 2013-08-08 VITALS — BP 141/81 | HR 102 | Temp 98.5°F | Resp 18

## 2013-08-08 DIAGNOSIS — Z5111 Encounter for antineoplastic chemotherapy: Secondary | ICD-10-CM

## 2013-08-08 DIAGNOSIS — C549 Malignant neoplasm of corpus uteri, unspecified: Secondary | ICD-10-CM

## 2013-08-08 DIAGNOSIS — C541 Malignant neoplasm of endometrium: Secondary | ICD-10-CM

## 2013-08-08 MED ORDER — FAMOTIDINE IN NACL 20-0.9 MG/50ML-% IV SOLN
20.0000 mg | Freq: Once | INTRAVENOUS | Status: AC
  Administered 2013-08-08: 20 mg via INTRAVENOUS

## 2013-08-08 MED ORDER — ONDANSETRON 16 MG/50ML IVPB (CHCC)
16.0000 mg | Freq: Once | INTRAVENOUS | Status: AC
  Administered 2013-08-08: 16 mg via INTRAVENOUS

## 2013-08-08 MED ORDER — DEXAMETHASONE SODIUM PHOSPHATE 20 MG/5ML IJ SOLN
INTRAMUSCULAR | Status: AC
Start: 2013-08-08 — End: 2013-08-08
  Filled 2013-08-08: qty 5

## 2013-08-08 MED ORDER — PACLITAXEL CHEMO INJECTION 300 MG/50ML
175.0000 mg/m2 | Freq: Once | INTRAVENOUS | Status: AC
  Administered 2013-08-08: 366 mg via INTRAVENOUS
  Filled 2013-08-08: qty 61

## 2013-08-08 MED ORDER — SODIUM CHLORIDE 0.9 % IV SOLN
Freq: Once | INTRAVENOUS | Status: AC
  Administered 2013-08-08: 10:00:00 via INTRAVENOUS

## 2013-08-08 MED ORDER — DIPHENHYDRAMINE HCL 25 MG PO CAPS
25.0000 mg | ORAL_CAPSULE | Freq: Once | ORAL | Status: AC
  Administered 2013-08-08: 25 mg via ORAL

## 2013-08-08 MED ORDER — DIPHENHYDRAMINE HCL 25 MG PO CAPS
ORAL_CAPSULE | ORAL | Status: AC
  Filled 2013-08-08: qty 1

## 2013-08-08 MED ORDER — ONDANSETRON 16 MG/50ML IVPB (CHCC)
INTRAVENOUS | Status: AC
  Filled 2013-08-08: qty 16

## 2013-08-08 MED ORDER — SODIUM CHLORIDE 0.9 % IV SOLN
533.0000 mg | Freq: Once | INTRAVENOUS | Status: AC
  Administered 2013-08-08: 530 mg via INTRAVENOUS
  Filled 2013-08-08: qty 53

## 2013-08-08 MED ORDER — DIPHENHYDRAMINE HCL 12.5 MG/5ML PO ELIX
12.5000 mg | ORAL_SOLUTION | Freq: Once | ORAL | Status: DC
  Filled 2013-08-08: qty 5

## 2013-08-08 MED ORDER — DEXAMETHASONE SODIUM PHOSPHATE 20 MG/5ML IJ SOLN
20.0000 mg | Freq: Once | INTRAMUSCULAR | Status: AC
  Administered 2013-08-08: 20 mg via INTRAVENOUS

## 2013-08-08 MED ORDER — FAMOTIDINE IN NACL 20-0.9 MG/50ML-% IV SOLN
INTRAVENOUS | Status: AC
  Filled 2013-08-08: qty 50

## 2013-08-08 NOTE — Patient Instructions (Signed)
[facility] Discharge Instructions for Patients Receiving Chemotherapy  Today you received the following chemotherapy agents: Carboplatin, Taxol   To help prevent nausea and vomiting after your treatment, we encourage you to take your nausea medication as prescribed.    If you develop nausea and vomiting that is not controlled by your nausea medication, call the clinic.   BELOW ARE SYMPTOMS THAT SHOULD BE REPORTED IMMEDIATELY:  *FEVER GREATER THAN 100.5 F  *CHILLS WITH OR WITHOUT FEVER  NAUSEA AND VOMITING THAT IS NOT CONTROLLED WITH YOUR NAUSEA MEDICATION  *UNUSUAL SHORTNESS OF BREATH  *UNUSUAL BRUISING OR BLEEDING  TENDERNESS IN MOUTH AND THROAT WITH OR WITHOUT PRESENCE OF ULCERS  *URINARY PROBLEMS  *BOWEL PROBLEMS  UNUSUAL RASH Items with * indicate a potential emergency and should be followed up as soon as possible.  Feel free to call the clinic you have any questions or concerns. The clinic phone number is [phone].    

## 2013-08-09 ENCOUNTER — Other Ambulatory Visit: Payer: Self-pay | Admitting: Oncology

## 2013-08-09 ENCOUNTER — Ambulatory Visit (HOSPITAL_BASED_OUTPATIENT_CLINIC_OR_DEPARTMENT_OTHER): Payer: 59

## 2013-08-09 VITALS — BP 129/58 | HR 72 | Temp 98.4°F

## 2013-08-09 DIAGNOSIS — D702 Other drug-induced agranulocytosis: Secondary | ICD-10-CM

## 2013-08-09 DIAGNOSIS — C7989 Secondary malignant neoplasm of other specified sites: Secondary | ICD-10-CM

## 2013-08-09 MED ORDER — FILGRASTIM 480 MCG/0.8ML IJ SOLN
480.0000 ug | Freq: Once | INTRAMUSCULAR | Status: DC
  Filled 2013-08-09: qty 0.8

## 2013-08-09 MED ORDER — PEGFILGRASTIM INJECTION 6 MG/0.6ML
6.0000 mg | Freq: Once | SUBCUTANEOUS | Status: AC
  Administered 2013-08-09: 6 mg via SUBCUTANEOUS
  Filled 2013-08-09: qty 0.6

## 2013-08-14 ENCOUNTER — Telehealth: Payer: Self-pay | Admitting: *Deleted

## 2013-08-14 NOTE — Telephone Encounter (Signed)
Had last chemo 08/08/13, then Neulasta following day. Got constipated and took Senokot #2 last night. Had good BM this am, then a 2nd & 3rd loose stool. Has been able to eat normal breakfast today and took her dog out for a walk. Has now started feeling "bad" as in weak. No nausea-estimates she has had 60 ounces of fluid so far today including Ensure smoothie. Asking if this is normal? Made her aware this is a possible senario for one going through chemo and getting constipated and then having large BM afterwards. Suggested she take it easy and do not go outdoors while it is so hot-let her husband walk the dog. Fatigue from chemo can be cumulative. Call back for severe constipation or diarrhea, sudden new pain, fever, vomiting or any bleeding. She understands and agrees.

## 2013-08-18 ENCOUNTER — Other Ambulatory Visit: Payer: Self-pay | Admitting: Oncology

## 2013-08-20 ENCOUNTER — Encounter: Payer: Self-pay | Admitting: Oncology

## 2013-08-20 ENCOUNTER — Telehealth: Payer: Self-pay

## 2013-08-20 ENCOUNTER — Other Ambulatory Visit (HOSPITAL_BASED_OUTPATIENT_CLINIC_OR_DEPARTMENT_OTHER): Payer: 59

## 2013-08-20 ENCOUNTER — Ambulatory Visit (HOSPITAL_BASED_OUTPATIENT_CLINIC_OR_DEPARTMENT_OTHER): Payer: 59 | Admitting: Oncology

## 2013-08-20 ENCOUNTER — Other Ambulatory Visit: Payer: Self-pay | Admitting: *Deleted

## 2013-08-20 VITALS — BP 146/78 | HR 70 | Temp 98.5°F | Resp 18 | Ht 67.0 in | Wt 206.5 lb

## 2013-08-20 DIAGNOSIS — C549 Malignant neoplasm of corpus uteri, unspecified: Secondary | ICD-10-CM

## 2013-08-20 DIAGNOSIS — C541 Malignant neoplasm of endometrium: Secondary | ICD-10-CM

## 2013-08-20 DIAGNOSIS — I10 Essential (primary) hypertension: Secondary | ICD-10-CM

## 2013-08-20 DIAGNOSIS — K7689 Other specified diseases of liver: Secondary | ICD-10-CM

## 2013-08-20 DIAGNOSIS — Z6832 Body mass index (BMI) 32.0-32.9, adult: Secondary | ICD-10-CM

## 2013-08-20 DIAGNOSIS — C7989 Secondary malignant neoplasm of other specified sites: Secondary | ICD-10-CM

## 2013-08-20 DIAGNOSIS — E876 Hypokalemia: Secondary | ICD-10-CM

## 2013-08-20 DIAGNOSIS — D702 Other drug-induced agranulocytosis: Secondary | ICD-10-CM

## 2013-08-20 LAB — CBC WITH DIFFERENTIAL/PLATELET
BASO%: 0.4 % (ref 0.0–2.0)
Basophils Absolute: 0.1 10*3/uL (ref 0.0–0.1)
EOS%: 2.2 % (ref 0.0–7.0)
Eosinophils Absolute: 0.5 10*3/uL (ref 0.0–0.5)
HCT: 39.2 % (ref 34.8–46.6)
HGB: 12.7 g/dL (ref 11.6–15.9)
LYMPH%: 11.8 % — ABNORMAL LOW (ref 14.0–49.7)
MCH: 29.4 pg (ref 25.1–34.0)
MCHC: 32.4 g/dL (ref 31.5–36.0)
MCV: 90.9 fL (ref 79.5–101.0)
MONO#: 1.5 10*3/uL — ABNORMAL HIGH (ref 0.1–0.9)
MONO%: 6.8 % (ref 0.0–14.0)
NEUT#: 16.9 10*3/uL — ABNORMAL HIGH (ref 1.5–6.5)
NEUT%: 78.8 % — ABNORMAL HIGH (ref 38.4–76.8)
Platelets: 229 10*3/uL (ref 145–400)
RBC: 4.31 10*6/uL (ref 3.70–5.45)
RDW: 18.5 % — ABNORMAL HIGH (ref 11.2–14.5)
WBC: 21.5 10*3/uL — ABNORMAL HIGH (ref 3.9–10.3)
lymph#: 2.5 10*3/uL (ref 0.9–3.3)

## 2013-08-20 LAB — COMPREHENSIVE METABOLIC PANEL (CC13)
ALK PHOS: 93 U/L (ref 40–150)
ALT: 27 U/L (ref 0–55)
AST: 23 U/L (ref 5–34)
Albumin: 3.7 g/dL (ref 3.5–5.0)
Anion Gap: 10 mEq/L (ref 3–11)
BILIRUBIN TOTAL: 0.31 mg/dL (ref 0.20–1.20)
BUN: 6.6 mg/dL — AB (ref 7.0–26.0)
CO2: 31 mEq/L — ABNORMAL HIGH (ref 22–29)
Calcium: 9.9 mg/dL (ref 8.4–10.4)
Chloride: 100 mEq/L (ref 98–109)
Creatinine: 0.8 mg/dL (ref 0.6–1.1)
Glucose: 86 mg/dl (ref 70–140)
Potassium: 3.6 mEq/L (ref 3.5–5.1)
SODIUM: 141 meq/L (ref 136–145)
TOTAL PROTEIN: 6.6 g/dL (ref 6.4–8.3)

## 2013-08-20 MED ORDER — LORAZEPAM 1 MG PO TABS: ORAL_TABLET | ORAL | Status: DC

## 2013-08-20 NOTE — Progress Notes (Signed)
OFFICE PROGRESS NOTE   08/20/2013   Physicians:Brewster, Canary Brim, Lorri Frederick, Louie Casa ([facility])   INTERVAL HISTORY:  Patient is seen, together with husband, in continuing attention to taxol carboplatin chemotherapy being used as initial intervention for endometrial cancer recurrent to pelvis. She had cycle 3 on 08-08-13, with neulasta on 08-09-13. Side effects from this most recent treatment were more difficult, and took a full week to subside. Nausea was fairly minimal and controlled with antiemetics. Pain in lower legs bilaterally was more severe, lasting 5 days despite claritin. She felt more weak, and one day did not get OOB at all, tho she did drink at least a gallon of fluids daily. She does not have any significant peripheral neuropathy symptoms. Bowels are moving well with formed stool daily. She is feeling some better today.  Plan is to repeat scans after this third cycle, scheduled for 08-28-13, then to see Dr Skeet Latch on 08-30-13.  She does not have PAC   ONCOLOGIC HISTORY Oncology History   IA FIGO grade I endometrial carcinoma diagnosed 09-2010, treated with [place] BSO pelvic nodes 11-24-2010 . Recurrent in pelvis 04-2013 confirmed by biopsy     Endometrial cancer   11/02/2010 Initial Diagnosis Endometrial cancer   History goes back to 07-2010 when she presented with vaginal spotting. Endometrial biopsy by Dr Clovia Cuff 10-21-10 revealed endometroid carcinoma grade 1 focally involving polyp. She had robotic assisted total laparoscopic hysterectomy BSO and 20 pelvic node evaluation by Dr Skeet Latch on 11-24-2010. Pathology [phone]) had endometrial adenocarcinoma Grade I with focal squamous differentiation, superficial myometrial invasion ( 0.2 cm where myometrium 1.4 cm); benign cervix, tubes and ovaries; no LVSI. With no high risk features, she was followed with observation only, including exams by gyn oncology every 6 months.  In early March 2015 she had acute onset of  lower abdominal pain, then fever and nausea over next several days. She was treated by PCP for presumed diverticulitis with cipro and flagyl, initially with improvement then symptoms recurrent in late March. She had CT AP 05-23-13 showed a 4.1x4.1 cm mass left anterior upper pelvis, and CT biopsy 05-31-13 of mesenteric mass at level of umbilicus showed metastatic adenocarcinoma consistent with endometrioid carcinoma. Patient saw Dr Skeet Latch at [facility] on 06-08-13, with exam remarkable for palpable right pelvic mass and recommendation for 3 cycles of taxane / platin chemotherapy, then repeat imaging; may also consider radiation after chemotherapy. The initial abdominal pain resolved entirely. Cycle 1 taxol carboplatin was given 06-26-2013; she was neutropenic with ANC 0 on day 14.  Review of systems as above, also: No fever or symptoms of infection. No abdominal or pelvic pain. No SOB. More difficulty sleeping at night, which she does not tolerate well, but slept well last pm after ativan. Remainder of 10 point Review of Systems negative.  Objective:  Vital signs in last 24 hours:  BP 146/78  Pulse 70  Temp(Src) 98.5 F (36.9 C) (Oral)  Resp 18  Ht 5\' 7"  (1.702 m)  Wt 206 lb 8 oz (93.668 kg)  BMI 32.33 kg/m2  Weight is down just 0.5 lb.  Alert, oriented and appropriate. Ambulatory without assistance. Looks tired but not acutely ill or uncomfortable Alopecia  HEENT:PERRL, sclerae not icteric. Oral mucosa moist without lesions, posterior pharynx clear.  Neck supple. No JVD.  Lymphatics:no cervical,suraclavicular adenopathy Resp: clear to auscultation bilaterally and normal percussion bilaterally Cardio: regular rate and rhythm. No gallop. GI: soft, nontender, not distended, no mass or organomegaly. Some bowel sounds.  Musculoskeletal/ Extremities: without pitting edema, cords, tenderness Neuro: no peripheral neuropathy. Otherwise nonfocal.  PSYCH mood and affect appropriate Skin without rash,  ecchymosis, petechiae  Lab Results:  Results for orders placed in visit on 08/20/13  CBC WITH DIFFERENTIAL      Result Value Ref Range   WBC 21.5 (*) 3.9 - 10.3 10e3/uL   NEUT# 16.9 (*) 1.5 - 6.5 10e3/uL   HGB 12.7  11.6 - 15.9 g/dL   HCT 39.2  34.8 - 46.6 %   Platelets 229  145 - 400 10e3/uL   MCV 90.9  79.5 - 101.0 fL   MCH 29.4  25.1 - 34.0 pg   MCHC 32.4  31.5 - 36.0 g/dL   RBC 4.31  3.70 - 5.45 10e6/uL   RDW 18.5 (*) 11.2 - 14.5 %   lymph# 2.5  0.9 - 3.3 10e3/uL   MONO# 1.5 (*) 0.1 - 0.9 10e3/uL   Eosinophils Absolute 0.5  0.0 - 0.5 10e3/uL   Basophils Absolute 0.1  0.0 - 0.1 10e3/uL   NEUT% 78.8 (*) 38.4 - 76.8 %   LYMPH% 11.8 (*) 14.0 - 49.7 %   MONO% 6.8  0.0 - 14.0 %   EOS% 2.2  0.0 - 7.0 %   BASO% 0.4  0.0 - 2.0 %  COMPREHENSIVE METABOLIC PANEL (QB16)      Result Value Ref Range   Sodium 141  136 - 145 mEq/L   Potassium 3.6  3.5 - 5.1 mEq/L   Chloride 100  98 - 109 mEq/L   CO2 31 (*) 22 - 29 mEq/L   Glucose 86  70 - 140 mg/dl   BUN 6.6 (*) 7.0 - 26.0 mg/dL   Creatinine 0.8  0.6 - 1.1 mg/dL   Total Bilirubin 0.31  0.20 - 1.20 mg/dL   Alkaline Phosphatase 93  40 - 150 U/L   AST 23  5 - 34 U/L   ALT 27  0 - 55 U/L   Total Protein 6.6  6.4 - 8.3 g/dL   Albumin 3.7  3.5 - 5.0 g/dL   Calcium 9.9  8.4 - 10.4 mg/dL   Anion Gap 10  3 - 11 mEq/L     Studies/Results:  No results found.  Medications: I have reviewed the patient's current medications. Will refill ativan, and I have encouraged her to use this at hs next few days if needed to sleep. She had difficult time taking K 10 mEq bid, so has used this only once daily. With K resulting after visit at 3.6, we will let her know to continue either this prescription 10 mEq once daily or she can use her preferred "natural" K tablet in similar dose.  DISCUSSION: patient is willing to continue treatment if it is helping, tho this treatment particularly has been difficult.  Assessment/Plan:  1.Endometrial carcinoma  initially IA grade 1 at surgery 11-2010, now with multifocal recurrence in pelvis. She has had 3 cycles of taxol carboplatin from 06-26-13 thru 08-08-13, will have restaging scans on 7-7 and see Dr Skeet Latch on 08-30-13. I have scheduled her back to my clinic on 7-13, but can move that date depending on recommendations from gyn oncology. 2.chemo induced neutropenia (ANC 0 day 14 cycle1) managed with neulasta  3.HTN: BP lower, holding antihypertensive based on BPs at home 4.fatty liver and obesity with BMI 32  5.diverticulosis 6.hypokalemia improved: see med plan above   Patient and husband have had all questions answered to their satisfaction and are in agreement  with plan. Time spent 25 min including >50% counseling and coordination of care.   LIVESAY,LENNIS P, MD   08/20/2013, 8:58 AM

## 2013-08-20 NOTE — Telephone Encounter (Signed)
Told Erika Strong that her KCL level from today was 3.6.  Dr. Marko Plume said to continue the KCL 10 meq daily supplement or her natural on .   Erika Strong just refilled the 10 meq KCL so she will take that until the bottle is finished and then go to her natural tablet if KCL level remains good.

## 2013-08-28 ENCOUNTER — Encounter (HOSPITAL_COMMUNITY): Payer: Self-pay

## 2013-08-28 ENCOUNTER — Telehealth: Payer: Self-pay | Admitting: Gynecologic Oncology

## 2013-08-28 ENCOUNTER — Other Ambulatory Visit: Payer: Self-pay | Admitting: Family Medicine

## 2013-08-28 ENCOUNTER — Telehealth: Payer: Self-pay

## 2013-08-28 ENCOUNTER — Ambulatory Visit (HOSPITAL_COMMUNITY)
Admission: RE | Admit: 2013-08-28 | Discharge: 2013-08-28 | Disposition: A | Discharge status: Home / Self Care | Payer: Medicare Other | Source: Ambulatory Visit | Attending: Oncology | Admitting: Oncology

## 2013-08-28 ENCOUNTER — Other Ambulatory Visit: Payer: Self-pay | Admitting: Oncology

## 2013-08-28 DIAGNOSIS — R1900 Intra-abdominal and pelvic swelling, mass and lump, unspecified site: Secondary | ICD-10-CM | POA: Insufficient documentation

## 2013-08-28 DIAGNOSIS — C549 Malignant neoplasm of corpus uteri, unspecified: Secondary | ICD-10-CM | POA: Diagnosis present

## 2013-08-28 DIAGNOSIS — C541 Malignant neoplasm of endometrium: Secondary | ICD-10-CM

## 2013-08-28 DIAGNOSIS — Z9221 Personal history of antineoplastic chemotherapy: Secondary | ICD-10-CM | POA: Diagnosis not present

## 2013-08-28 MED ORDER — IOHEXOL 300 MG/ML  SOLN
100.0000 mL | Freq: Once | INTRAMUSCULAR | Status: AC | PRN
  Administered 2013-08-28: 100 mL via INTRAVENOUS

## 2013-08-28 NOTE — Telephone Encounter (Signed)
CC:  Recurrent endometrial cancer  Assessment/Plan:  Erika Strong is a 68 y.o. stage IA grade 1 endometrial adenocarcinoma staged 11/2010 now with multifocal recurrence identified 04/2013.  She received 3 cycles taxol/carboplatin and CT 08/28/2013 demonstrates interval improval.  Continue for an additional 3 cycles of chemothrapy  The recommendation was for taxane/platin x 3 cycles with consideration for adjuvant radiotherapy after completion of chemotherapy.  I have made a referral to Dr. Evlyn Clines at [facility] for consultation for chemotherapy Will follow-up with Erika Strong in 2 months at [facility]. Imaging after 3 cycles of chemotherapy    HPI: ,Erika Strong is a 68 y.o. gravida 1, para 1 was in her usual state of health until early June 2012, and she noted light vaginal spotting, this spotting persisted and she sought the advice of Dr. Madilyn Fireman, who directly facilitated referral to Dr. Clovia Cuff for evaluation. A pelvic ultrasound was obtained and was notable for uterus measuring 6.4 x 2.7 x 4.8 cm. A fibroid was noted in the anterior uterine segment and left fundus. The endometrium was noted to measure 13.4 mm in thickness. Otherwise, the findings were unremarkable. An endometrial biopsy was obtained on October 21, 2010, and demonstrated endometrioid type grade 1 adenocarcinoma focally involving a polyp.   On 11/24/2010 she underwent [place] BSO RPLND LPLNS Pathology endometrial adenocarcinoma with focal superficial myometrial invasion. A invasion was 0.2 cm where the myometrium is 1.4 cm there was no lymph vascular space invasion 0 of 20 lymph nodes were involved with tumor.  Interval History: Patient reported a recent episode of lower abdominal pain c/w diverticulitis in early 2015. She was Rx with a 10 day course of antibiotics with resolution of the abdominal pain. Imaging was obtained  05/22/2013 which demonstrated In the pelvis,  there is a mass in the left anterior upper pelvis measuring 4.1 x 4.1 cm which does not connect to bowel. Uterus is absent. There is a lymph node lateral to the rectum on the right measuring 2.1 x 1.7 cm, consistent with adenopathy. The urinary bladder is midline with normal wall thickness. Uterus is absent  There is wall thickening throughout much of the sigmoid colon with mucosal irregularity consistent with diverticulitis. There is no abscess. There is no bowel obstruction. No free air or portal venous air.  There is no ascites or abscess in the abdomen or pelvis. No other adenopathy is seen beyond that described lateral to the rectum on the right.  Bx mesenteric mass 05/31/2013 Mesentery, ABD mesenteric mass - METASTATIC ADENOCARCINOMA, CONSISTENT WITH ENDOMETRIOID ADENOCARCINOMA.  Erika Strong has received 3 cycles taxol/carboplatin as of 08/08/2013.   Interval CT 08/28/2013 There is no retroperitoneal lymphadenopathy. Small periaortic lymph nodes are unchanged from prior. Lobular mass along the ventral peritoneal surface just below the umbilicus measures 37 x 23 mm compared to 41 x 41 mm on prior for some interval contraction. No new peritoneal lesions are identified. In the pelvis, a soft tissue mass along the right pelvic sidewall measures 13 x 10 mm decreased from 21 x 17 mm on prior . There is a serosal implant along the sigmoid colon which roughly measures 22 x 16 mm  decreased from 30 mm x 23 mm on prior. No evidence of obstruction of the colon. Post hysterectomy anatomy. The bladder is normal. No pathologically enlarged inguinal nodes. There  is 11 mm left external iliac lymph node unchanged prior.   Review of Systems  Constitutional  Feels well  Cardiovascular  No chest pain, shortness of breath, or edema  Pulmonary  No cough or wheeze.  Gastro Intestinal  No nausea, vomitting, or diarrhoea. No bright red blood per rectum, no abdominal pain Genito Urinary  No frequency, urgency,  dysuria, no vaginal bleeding. Musculo Skeletal  No myalgia, arthralgia, joint swelling or pain  Neurologic  No weakness, numbness, change in gait,    Social Hx:  History   Social History   .  Marital Status:  Married     Spouse Name:  N/A     Number of Children:  N/A   .  Years of Education:  Assoc degr   Occupational History   .  cosmotologist/teacher    Social History Main Topics   .  Smoking status:  Never Smoker   .  Smokeless tobacco:  Never Used   .  Alcohol Use:  No   .  Drug Use:  No   .  Sexually Active:  No      cosmetologist, teaches, associate degree, married, regularly exercises.    Other Topics  Concern   .  Not on file   Social History Narrative   .  No narrative on file   Past Surgical Hx:  Past Surgical History   Procedure  Date   .  Gonadectomy and hysterectomy  11/24/2010     Endometrial cancer, performed by Dr. Janie Morning   .  Mole removal  Nov 2012   Robotic lsc hysterectomy BSO RPLND LPLNS 11/24/2010    Past Medical Hx:  Past Medical History   Diagnosis  Date   .  Wears glasses    .  Wears glasses    .  Hypertension    .  Diverticulitis  2010   .  Hyperlipidemia      2007   .  Cancer    .  Endometrial cancer  11/02/2010   Family Hx:  Family History   Problem  Relation  Age of Onset   .  Lung cancer  Father      smoker   .  Cancer  Father    .  Osteoporosis  Mother    .  Rheum arthritis  Mother    .  Cancer  Mother    Vitals:  Physical Exam:  Neck  Supple without any enlargements.  Cardiovascular  Pulse normal rate, regularity and rhythm.  Lungs  Clear to auscultation bilaterally Psychiatry  Alert and oriented to person, appropriate affect and speech. Abdomen  Normoactive bowel sounds, abdomen soft, non-tender and obese. Surgical sites intact without evidence of hernia. No palpable abdominal wall masses. Genito Urinary  Vulva/vagina: Normal external female genitalia. Right 3cm pelvic sidewall mass palpated. No vaginal  lesions Extremities  1-2+ lower extremity edema.

## 2013-08-28 NOTE — Telephone Encounter (Signed)
Erika Strong had her CT Abdomen/Pelvis  this am  ~0830.  Drank the oral contrast and had IV contrast.  She pushed po fluids had a smoothy  then lunch -hamburger, onions , slaw.  She vomited a short time after.  She used the zofran with little effect. She feels nauseous. Temp 99.2. Told her that she may not have tolerated the heavy food after the oral and IV contrast of the CT scan today. Suggested that she take an ativan under the tongue now and rest for a few hours and sip on spirite.   Suggested she take zofran this evening and repeat Ativan at hs and she how she is in am and eat lighter less greasy foods. She is to call if her temp is 101.5 or greater.

## 2013-08-29 ENCOUNTER — Ambulatory Visit: Payer: 59

## 2013-08-29 ENCOUNTER — Other Ambulatory Visit: Payer: 59

## 2013-08-30 ENCOUNTER — Encounter: Payer: Self-pay | Admitting: Gynecologic Oncology

## 2013-08-30 ENCOUNTER — Ambulatory Visit: Payer: Medicare Other | Attending: Gynecologic Oncology | Admitting: Gynecologic Oncology

## 2013-08-30 VITALS — BP 152/74 | HR 67 | Temp 97.7°F | Resp 18 | Ht 67.0 in | Wt 206.7 lb

## 2013-08-30 DIAGNOSIS — E785 Hyperlipidemia, unspecified: Secondary | ICD-10-CM | POA: Insufficient documentation

## 2013-08-30 DIAGNOSIS — C549 Malignant neoplasm of corpus uteri, unspecified: Secondary | ICD-10-CM | POA: Insufficient documentation

## 2013-08-30 DIAGNOSIS — I10 Essential (primary) hypertension: Secondary | ICD-10-CM | POA: Diagnosis not present

## 2013-08-30 DIAGNOSIS — Z9221 Personal history of antineoplastic chemotherapy: Secondary | ICD-10-CM | POA: Diagnosis not present

## 2013-08-30 DIAGNOSIS — C541 Malignant neoplasm of endometrium: Secondary | ICD-10-CM

## 2013-08-30 NOTE — Patient Instructions (Signed)
CT 08/28/2013 demonstrates a good response to chemotherapy Continue for an additional 3 cycles of chemotherapy  Will follow-up with you in 3 months  Imaging after cycle 6 of chemotherapy    Thank you very much Ms. SARAHELIZABETH CONWAY for allowing me to provide care for you today.  I appreciate your confidence in choosing our Gynecologic Oncology team.  If you have any questions about your visit today please call our office and we will get back to you as soon as possible.  Please consider using the website Medlineplus.gov as an Geneticist, molecular.   Francetta Found. Amorie Rentz MD., PhD Gynecologic Oncology

## 2013-08-30 NOTE — Progress Notes (Signed)
Office Visit:  GYN ONCOLOGY  CC:  Recurrent endometrial cancer  Assessment/Plan:  Erika Strong is a 68 y.o. stage IA grade 1 endometrial adenocarcinoma staged 11/2010 now with multifocal recurrence identified 04/2013.  She received 3 cycles taxol/carboplatin and CT 08/28/2013 demonstrates interval improval.  Continue for an additional 3 cycles of chemotherapy  Will follow-up with Erika Strong in 3 months  Imaging after cycle 6 of chemotherapy    HPI: ,Erika Strong is a 68 y.o. gravida 1, para 1 was in her usual state of health until early June 2012, and she noted light vaginal spotting, this spotting persisted and she sought the advice of Dr. Madilyn Fireman, who directly facilitated referral to Dr. Clovia Cuff for evaluation. A pelvic ultrasound was obtained and was notable for uterus measuring 6.4 x 2.7 x 4.8 cm. A fibroid was noted in the anterior uterine segment and left fundus. The endometrium was noted to measure 13.4 mm in thickness. Otherwise, the findings were unremarkable. An endometrial biopsy was obtained on October 21, 2010, and demonstrated endometrioid type grade 1 adenocarcinoma focally involving a polyp.   On 11/24/2010 she underwent [facility] BSO RPLND LPLNS Pathology endometrial adenocarcinoma with focal superficial myometrial invasion. A invasion was 0.2 cm where the myometrium is 1.4 cm there was no lymph vascular space invasion 0 of 20 lymph nodes were involved with tumor.  Interval History: Patient reported a recent episode of lower abdominal pain c/w diverticulitis in early 2015. She was Rx with a 10 day course of antibiotics with resolution of the abdominal pain. Imaging was obtained  05/22/2013 which demonstrated In the pelvis, there is a mass in the left anterior upper pelvis measuring 4.1 x 4.1 cm which does not connect to bowel. Uterus is absent. There is a lymph node lateral to the rectum on the right measuring 2.1 x 1.7 cm, consistent with adenopathy. The urinary bladder is midline  with normal wall thickness. Uterus is absent  There is wall thickening throughout much of the sigmoid colon with mucosal irregularity consistent with diverticulitis. There is no abscess. There is no bowel obstruction. No free air or portal venous air.  There is no ascites or abscess in the abdomen or pelvis. No other adenopathy is seen beyond that described lateral to the rectum on the right.  Bx mesenteric mass 05/31/2013 Mesentery, ABD mesenteric mass - METASTATIC ADENOCARCINOMA, CONSISTENT WITH ENDOMETRIOID ADENOCARCINOMA.  Erika Strong has received 3 cycles taxol/carboplatin as of 08/08/2013.   Interval CT 08/28/2013 There is no retroperitoneal lymphadenopathy. Small periaortic lymph nodes are unchanged from prior. Lobular mass along the ventral peritoneal surface just below the umbilicus measures 37 x 23 mm compared to 41 x 41 mm on prior for some interval contraction. No new peritoneal lesions are identified. In the pelvis, a soft tissue mass along the right pelvic sidewall measures 13 x 10 mm decreased from 21 x 17 mm on prior . There is a serosal implant along the sigmoid colon which roughly measures 22 x 16 mm  decreased from 30 mm x 23 mm on prior. No evidence of obstruction of the colon. Post hysterectomy anatomy. The bladder is normal. No pathologically enlarged inguinal nodes. There is 11 mm left external iliac lymph node unchanged prior.   Review of Systems  Constitutional  Feels well  Cardiovascular  No chest pain, shortness of breath, or edema  Pulmonary  No cough or wheeze.  Gastro Intestinal  No nausea, vomitting, or diarrhoea. No bright red blood per rectum, no  abdominal pain Genito Urinary  No frequency, urgency, dysuria, no vaginal bleeding. Musculo Skeletal  No myalgia, arthralgia, joint swelling or pain  Neurologic  No weakness, numbness, change in gait,    Past Medical Hx:  Past Medical History  Diagnosis Date  . Wears glasses   . Hypertension   . Diverticulitis  2010  . Hyperlipidemia     2007  . Cancer   . Endometrial cancer 11/02/2010  . Malignant neoplasm of corpus uteri, except isthmus 01/21/2011   Past Surgical History  Procedure Laterality Date  . Gonadectomy and hysterectomy  11/24/2010    Endometrial cancer, performed by Dr. Janie Morning  . Mole removal  Nov 2012  . Abdominal hysterectomy  11/24/2010    RTLH, BSO, RPLND, LPLNS   History   Social History Narrative   Walks daily for exercise.       Vitals: BP 152/74  Pulse 67  Temp(Src) 97.7 F (36.5 C) (Oral)  Resp 18  Ht 5\' 7"  (1.702 m)  Wt 206 lb 11.2 oz (93.759 kg)  BMI 32.37 kg/m2  Physical Exam:  Neck  Supple without any enlargements.  Cardiovascular  Pulse normal rate, regularity and rhythm.  Lungs  Clear to auscultation bilaterally Psychiatry  Alert and oriented to person, appropriate affect and speech. Abdomen  Normoactive bowel sounds, abdomen soft, non-tender and obese. Surgical sites intact without evidence of hernia. No palpable abdominal wall masses. Genito Urinary  Vulva/vagina: grade 1 cystocele, grade 1 rectocele  Normal external female genitalia. Right 2cm pelvic sidewall mass palpated. No vaginal lesions Rectal:  External hemorrhoids, good tone no masses Extremities  1-2+ lower extremity edema.

## 2013-09-01 ENCOUNTER — Other Ambulatory Visit: Payer: Self-pay | Admitting: Oncology

## 2013-09-03 ENCOUNTER — Ambulatory Visit (HOSPITAL_BASED_OUTPATIENT_CLINIC_OR_DEPARTMENT_OTHER): Payer: 59 | Admitting: Oncology

## 2013-09-03 ENCOUNTER — Telehealth: Payer: Self-pay | Admitting: Oncology

## 2013-09-03 ENCOUNTER — Encounter: Payer: Self-pay | Admitting: Oncology

## 2013-09-03 ENCOUNTER — Telehealth: Payer: Self-pay | Admitting: *Deleted

## 2013-09-03 ENCOUNTER — Other Ambulatory Visit: Payer: Self-pay

## 2013-09-03 ENCOUNTER — Other Ambulatory Visit (HOSPITAL_BASED_OUTPATIENT_CLINIC_OR_DEPARTMENT_OTHER): Payer: 59

## 2013-09-03 VITALS — BP 154/79 | HR 67 | Temp 98.0°F | Resp 20 | Ht 67.0 in | Wt 206.8 lb

## 2013-09-03 DIAGNOSIS — I10 Essential (primary) hypertension: Secondary | ICD-10-CM

## 2013-09-03 DIAGNOSIS — C541 Malignant neoplasm of endometrium: Secondary | ICD-10-CM

## 2013-09-03 DIAGNOSIS — D702 Other drug-induced agranulocytosis: Secondary | ICD-10-CM

## 2013-09-03 DIAGNOSIS — C779 Secondary and unspecified malignant neoplasm of lymph node, unspecified: Secondary | ICD-10-CM

## 2013-09-03 DIAGNOSIS — E669 Obesity, unspecified: Secondary | ICD-10-CM

## 2013-09-03 DIAGNOSIS — K7689 Other specified diseases of liver: Secondary | ICD-10-CM

## 2013-09-03 DIAGNOSIS — C549 Malignant neoplasm of corpus uteri, unspecified: Secondary | ICD-10-CM

## 2013-09-03 DIAGNOSIS — C7989 Secondary malignant neoplasm of other specified sites: Secondary | ICD-10-CM

## 2013-09-03 DIAGNOSIS — K5730 Diverticulosis of large intestine without perforation or abscess without bleeding: Secondary | ICD-10-CM

## 2013-09-03 DIAGNOSIS — E876 Hypokalemia: Secondary | ICD-10-CM

## 2013-09-03 DIAGNOSIS — C50919 Malignant neoplasm of unspecified site of unspecified female breast: Secondary | ICD-10-CM

## 2013-09-03 LAB — COMPREHENSIVE METABOLIC PANEL (CC13)
ALT: 34 U/L (ref 0–55)
ANION GAP: 12 meq/L — AB (ref 3–11)
AST: 23 U/L (ref 5–34)
Albumin: 3.7 g/dL (ref 3.5–5.0)
Alkaline Phosphatase: 59 U/L (ref 40–150)
BUN: 10 mg/dL (ref 7.0–26.0)
CALCIUM: 9.8 mg/dL (ref 8.4–10.4)
CHLORIDE: 101 meq/L (ref 98–109)
CO2: 27 meq/L (ref 22–29)
Creatinine: 0.7 mg/dL (ref 0.6–1.1)
Glucose: 78 mg/dl (ref 70–140)
Potassium: 3.6 mEq/L (ref 3.5–5.1)
SODIUM: 140 meq/L (ref 136–145)
TOTAL PROTEIN: 6.9 g/dL (ref 6.4–8.3)
Total Bilirubin: 0.53 mg/dL (ref 0.20–1.20)

## 2013-09-03 LAB — CBC WITH DIFFERENTIAL/PLATELET
BASO%: 0.4 % (ref 0.0–2.0)
Basophils Absolute: 0 10*3/uL (ref 0.0–0.1)
EOS%: 2.1 % (ref 0.0–7.0)
Eosinophils Absolute: 0.2 10*3/uL (ref 0.0–0.5)
HEMATOCRIT: 39 % (ref 34.8–46.6)
HGB: 13.1 g/dL (ref 11.6–15.9)
LYMPH#: 1.8 10*3/uL (ref 0.9–3.3)
LYMPH%: 20.9 % (ref 14.0–49.7)
MCH: 30.5 pg (ref 25.1–34.0)
MCHC: 33.6 g/dL (ref 31.5–36.0)
MCV: 90.7 fL (ref 79.5–101.0)
MONO#: 1.1 10*3/uL — AB (ref 0.1–0.9)
MONO%: 13 % (ref 0.0–14.0)
NEUT#: 5.6 10*3/uL (ref 1.5–6.5)
NEUT%: 63.6 % (ref 38.4–76.8)
Platelets: 167 10*3/uL (ref 145–400)
RBC: 4.3 10*6/uL (ref 3.70–5.45)
RDW: 18.9 % — AB (ref 11.2–14.5)
WBC: 8.7 10*3/uL (ref 3.9–10.3)

## 2013-09-03 MED ORDER — DEXAMETHASONE 4 MG PO TABS: ORAL_TABLET | ORAL | Status: DC

## 2013-09-03 MED ORDER — ONDANSETRON HCL 8 MG PO TABS: ORAL_TABLET | ORAL | Status: DC

## 2013-09-03 NOTE — Telephone Encounter (Signed)
per pof to sch pt appt-sent MW email to sch trmt-adv pt I will call with appt time & date

## 2013-09-03 NOTE — Telephone Encounter (Signed)
Per staff message and POF I have scheduled appts. Advised scheduler of appts. JMW  

## 2013-09-03 NOTE — Telephone Encounter (Signed)
Message Received: Today     Amy Earley Abide, RN Prentiss Bells, RN            Previous Messages      ----- Message -----  From: Gordy Levan, MD  Sent: 09/03/2013 9:12 AM  To: Marylyn Ishihara, RN   Please refill zofran and decadron. Decadron for #30 for 3 cycles   Thanks  Lennis

## 2013-09-03 NOTE — Telephone Encounter (Signed)
tlkwd w/pt & adv of time & date for appts-adv would mail copy of sch-pt understood

## 2013-09-03 NOTE — Progress Notes (Signed)
OFFICE PROGRESS NOTE   09/03/2013   Physicians:Brewster, Canary Brim, Lorri Frederick, Louie Casa ([facility])   INTERVAL HISTORY:  Patient is seen, together with husband, continuing attention to endometrial cancer recurrent to pelvis, with partial response to initial 3 cycles of taxol carboplatin given 06-26-13 thru 08-08-13 as confirmed by CT AP 08-28-13. She saw Dr Skeet Latch on 08-30-13, with recommendation for 3 additional cycles of this chemotherapy then repeat scans and follow up visit with gyn oncology.  Patient and husband are both willing to continue chemotherapy to try to maximize response. She has felt stronger and better overall in the slightly longer break since cycle 3. She has no pelvic pain, no LE swelling, improved appetite and no significant peripheral neuropathy now.  She does not have PAC. She prefers peripheral IV access if possible.   ONCOLOGIC HISTORY Oncology History   IA FIGO grade I endometrial carcinoma diagnosed 09-2010, treated with [facility] BSO pelvic nodes 11-24-2010 . Recurrent in pelvis 04-2013 confirmed by biopsy     Endometrial cancer   11/02/2010 Initial Diagnosis Endometrial cancer  History goes back to 07-2010 when she presented with vaginal spotting. Endometrial biopsy by Dr Clovia Cuff 10-21-10 revealed endometroid carcinoma grade 1 focally involving polyp. She had robotic assisted total laparoscopic hysterectomy BSO and 20 pelvic node evaluation by Dr Skeet Latch on 11-24-2010. Pathology [phone]) had endometrial adenocarcinoma Grade I with focal squamous differentiation, superficial myometrial invasion ( 0.2 cm where myometrium 1.4 cm); benign cervix, tubes and ovaries; no LVSI. With no high risk features, she was followed with observation only, including exams by gyn oncology every 6 months.  In early March 2015 she had acute onset of lower abdominal pain, then fever and nausea over next several days. She was treated by PCP for presumed diverticulitis with cipro  and flagyl, initially with improvement then symptoms recurrent in late March. She had CT AP 05-23-13 showed a 4.1x4.1 cm mass left anterior upper pelvis, and CT biopsy 05-31-13 of mesenteric mass at level of umbilicus showed metastatic adenocarcinoma consistent with endometrioid carcinoma. Patient saw Dr Skeet [facility]atch at [facility] on 06-08-13, with exam remarkable for palpable right pelvic mass and recommendation for 3 cycles of taxane / platin chemotherapy, then repeat imaging; may also consider radiation after chemotherapy. The initial abdominal pain resolved entirely. Cycle 1 taxol carboplatin was given 06-26-2013; she was neutropenic with ANC 0 on day 14. Repeat CT AP after cycle 3 had partial response of dominant ventral peritoneal mass, right pelvic sidewall mass and implant along sigmoid colon. Recommendation was 3 additional cycles of chemotherapy.    Review of systems as above, also: No fever or symptoms of infection. No SOB or cough. Bowels moving regularly, no bladder complaints. No LE pain or significant swelling. Able to make jelly, very tired for 24 hrs after cleaning out swimming pool over weekend. Remainder of 10 point Review of Systems negative.  Objective:  Vital signs in last 24 hours:  BP 154/79  Pulse 67  Temp(Src) 98 F (36.7 C) (Oral)  Resp 20  Ht 5\' 7"  (1.702 m)  Wt 206 lb 12.8 oz (93.804 kg)  BMI 32.38 kg/m2 weight is stable  Alert, oriented and appropriate. Ambulatory without difficulty.  Alopecia  HEENT:PERRL, sclerae not icteric. Oral mucosa moist without lesions, posterior pharynx clear.  Neck supple. No JVD.  Lymphatics:no cervical,suraclavicular or inguinal adenopathy Resp: clear to auscultation bilaterally and normal percussion bilaterally Cardio: regular rate and rhythm. No gallop. GI: soft, nontender, not distended, no mass or organomegaly. Normally active  bowel sounds. Surgical incision not remarkable. Musculoskeletal/ Extremities: without pitting edema, cords,  tenderness. Back not tender Neuro: no peripheral neuropathy. Otherwise nonfocal. PSYCH normal mood and affect Skin without rash, ecchymosis, petechiae   Lab Results:  Results for orders placed in visit on 09/03/13  CBC WITH DIFFERENTIAL      Result Value Ref Range   WBC 8.7  3.9 - 10.3 10e3/uL   NEUT# 5.6  1.5 - 6.5 10e3/uL   HGB 13.1  11.6 - 15.9 g/dL   HCT 39.0  34.8 - 46.6 %   Platelets 167  145 - 400 10e3/uL   MCV 90.7  79.5 - 101.0 fL   MCH 30.5  25.1 - 34.0 pg   MCHC 33.6  31.5 - 36.0 g/dL   RBC 4.30  3.70 - 5.45 10e6/uL   RDW 18.9 (*) 11.2 - 14.5 %   lymph# 1.8  0.9 - 3.3 10e3/uL   MONO# 1.1 (*) 0.1 - 0.9 10e3/uL   Eosinophils Absolute 0.2  0.0 - 0.5 10e3/uL   Basophils Absolute 0.0  0.0 - 0.1 10e3/uL   NEUT% 63.6  38.4 - 76.8 %   LYMPH% 20.9  14.0 - 49.7 %   MONO% 13.0  0.0 - 14.0 %   EOS% 2.1  0.0 - 7.0 %   BASO% 0.4  0.0 - 2.0 %    CMET available after visit normal including potassium 3.6 on supplement and creat 0.7   Studies/Results: CT ABDOMEN AND PELVIS WITH CONTRAST    COMPARISON: CT 05/22/2013  FINDINGS:  Lung bases are clear. No pericardial fluid.  No focal hepatic lesion. Gallbladder, pancreas, spleen, adrenal  glands, and kidneys are normal.  Stomach, small bowel, appendix, and cecum are normal. The colon and  rectosigmoid colon are normal.  Abdominal aorta is normal caliber. There is no retroperitoneal  lymphadenopathy. Small periaortic lymph nodes are unchanged from  prior.  Lobular mass along the ventral peritoneal surface just below the  umbilicus measures 37 x 23 mm compared to 41 x 41 mm on prior for  some interval contraction. No new peritoneal lesions are identified.  In the pelvis, a soft tissue mass along the right pelvic sidewall  measures 13 x 10 mm decreased from 21 x 17 mm on prior (image 73,  series 2). There is a serosal implant along the sigmoid colon which  roughly measures 22 x 16 mm (image 73, series 2) decreased from 30  mm x  23 mm on prior. No evidence of obstruction of the colon.  Post hysterectomy anatomy. The bladder is normal. No pathologically  enlarged inguinal nodes. There is 11 mm left external iliac lymph  node unchanged prior.  No aggressive osseous lesion.  IMPRESSION:  1. Positive response to chemotherapy with no evidence of disease  progression.  2. Dominant ventral peritoneal masses decreased in volume.  3. Right pelvic sidewall mass and serosal implant along the sigmoid  colon are also decreased in size.   We have reviewed CT report as above, as they had additional questions since talking with Dr Skeet Latch.    Medications: I have reviewed the patient's current medications. She will continue potassium. Prescriptions sent for decadron enough for 3 cycles and zofran  DISCUSSION: Patient has asked several times if this cancer can be cured, and I have clearly told her each time that I do not expect the chemotherapy will eradicate the cancer, but that we are trying to shrink it as much as possible now in hopes of  keeping it controlled for a long time, hopefully to allow observation break off of chemo when we get maximum response. She tells me that Dr Skeet Latch did not discuss any additional surgery. I have explained that radiation to areas such as the serosal colon implant would not be additionally helpful at this time. I have told them that the very long disease free interval initially (11-2010 to 05-2013) is a very favorable prognostic feature, and we are pleased with decrease in size of involved areas with chemotherapy to date.   She is due regular dermatology exam with Dr Tamala Julian at [facility] in [place], which is fine to keep.  Assessment/Plan:  1.Endometrial carcinoma initially IA grade 1 at surgery 11-2010, now with multifocal recurrence in pelvis. She has had 3 cycles of taxol carboplatin from 06-26-13 thru 08-08-13, partial response as above, and will now have 3 additional cycles  followed by repeat imaging. 2.chemo induced neutropenia (ANC 0 day 14 cycle1) managed with neulasta, which will continue with upcoming treatments 3.HTN: BP lower, holding antihypertensive based on BPs at home  4.fatty liver and obesity with BMI 32  5.diverticulosis  6.hypokalemia improved: continue K supplements    Patient and husband understood discussion, tho they wish information was better. She is in agreement with plan as above and wants to resume treatment promptly, cycle 4 scheduled for 09-06-13 with neulasta on 09-07-13. She will see APP after cycle 4, and will have cycle 5 on 09-27-13 as long as ANC >=1.5, plt >=100k and chemistries meet parameters. I will see her after cycle 5.  Chemo orders for this week confirmed by labs from today. She does not need additional labs day of chemo this week.  Chemo orders for 09-27-13 signed now based on present labs, with comment to nursing and pharmacy that doses need to be confirmed by labs done day of that treatment. Neulasta orders signed for this week and for 09-28-13.  Notification for prior auth for 3 additional cycles sent.  Cybill Uriegas P, MD   09/03/2013, 8:45 AM

## 2013-09-05 ENCOUNTER — Other Ambulatory Visit: Payer: Self-pay | Admitting: *Deleted

## 2013-09-05 ENCOUNTER — Telehealth: Payer: Self-pay | Admitting: Adult Health

## 2013-09-05 DIAGNOSIS — C541 Malignant neoplasm of endometrium: Secondary | ICD-10-CM

## 2013-09-05 NOTE — Telephone Encounter (Signed)
cld pt & ledft a message in re to sch & trmts-adv pt to stop by sch to pick up updated sch on 7/16 when she comes in for trmt

## 2013-09-06 ENCOUNTER — Other Ambulatory Visit (HOSPITAL_BASED_OUTPATIENT_CLINIC_OR_DEPARTMENT_OTHER): Payer: 59

## 2013-09-06 ENCOUNTER — Ambulatory Visit (HOSPITAL_BASED_OUTPATIENT_CLINIC_OR_DEPARTMENT_OTHER): Payer: 59

## 2013-09-06 VITALS — BP 132/73 | HR 86 | Temp 98.4°F | Resp 18

## 2013-09-06 DIAGNOSIS — C541 Malignant neoplasm of endometrium: Secondary | ICD-10-CM

## 2013-09-06 DIAGNOSIS — C779 Secondary and unspecified malignant neoplasm of lymph node, unspecified: Secondary | ICD-10-CM

## 2013-09-06 DIAGNOSIS — Z5111 Encounter for antineoplastic chemotherapy: Secondary | ICD-10-CM

## 2013-09-06 DIAGNOSIS — C50919 Malignant neoplasm of unspecified site of unspecified female breast: Secondary | ICD-10-CM

## 2013-09-06 DIAGNOSIS — C549 Malignant neoplasm of corpus uteri, unspecified: Secondary | ICD-10-CM

## 2013-09-06 LAB — CBC WITH DIFFERENTIAL/PLATELET
BASO%: 0.2 % (ref 0.0–2.0)
BASOS ABS: 0 10*3/uL (ref 0.0–0.1)
EOS%: 0 % (ref 0.0–7.0)
Eosinophils Absolute: 0 10*3/uL (ref 0.0–0.5)
HEMATOCRIT: 40.4 % (ref 34.8–46.6)
HGB: 13.3 g/dL (ref 11.6–15.9)
LYMPH#: 0.7 10*3/uL — AB (ref 0.9–3.3)
LYMPH%: 14.1 % (ref 14.0–49.7)
MCH: 30 pg (ref 25.1–34.0)
MCHC: 33 g/dL (ref 31.5–36.0)
MCV: 90.8 fL (ref 79.5–101.0)
MONO#: 0.1 10*3/uL (ref 0.1–0.9)
MONO%: 1 % (ref 0.0–14.0)
NEUT#: 4.4 10*3/uL (ref 1.5–6.5)
NEUT%: 84.7 % — AB (ref 38.4–76.8)
Platelets: 225 10*3/uL (ref 145–400)
RBC: 4.44 10*6/uL (ref 3.70–5.45)
RDW: 18.5 % — AB (ref 11.2–14.5)
WBC: 5.2 10*3/uL (ref 3.9–10.3)

## 2013-09-06 LAB — COMPREHENSIVE METABOLIC PANEL (CC13)
ALK PHOS: 63 U/L (ref 40–150)
ALT: 27 U/L (ref 0–55)
AST: 22 U/L (ref 5–34)
Albumin: 3.8 g/dL (ref 3.5–5.0)
Anion Gap: 16 mEq/L — ABNORMAL HIGH (ref 3–11)
BUN: 12.2 mg/dL (ref 7.0–26.0)
CO2: 22 mEq/L (ref 22–29)
CREATININE: 0.8 mg/dL (ref 0.6–1.1)
Calcium: 9.9 mg/dL (ref 8.4–10.4)
Chloride: 100 mEq/L (ref 98–109)
Glucose: 196 mg/dl — ABNORMAL HIGH (ref 70–140)
Potassium: 3.3 mEq/L — ABNORMAL LOW (ref 3.5–5.1)
Sodium: 138 mEq/L (ref 136–145)
Total Bilirubin: 0.64 mg/dL (ref 0.20–1.20)
Total Protein: 7.3 g/dL (ref 6.4–8.3)

## 2013-09-06 MED ORDER — DEXAMETHASONE SODIUM PHOSPHATE 20 MG/5ML IJ SOLN
20.0000 mg | Freq: Once | INTRAMUSCULAR | Status: AC
  Administered 2013-09-06: 20 mg via INTRAVENOUS

## 2013-09-06 MED ORDER — DIPHENHYDRAMINE HCL 12.5 MG/5ML PO ELIX
12.5000 mg | ORAL_SOLUTION | Freq: Once | ORAL | Status: AC
  Administered 2013-09-06: 12.5 mg via ORAL
  Filled 2013-09-06: qty 5

## 2013-09-06 MED ORDER — ONDANSETRON 16 MG/50ML IVPB (CHCC)
INTRAVENOUS | Status: AC
  Filled 2013-09-06: qty 16

## 2013-09-06 MED ORDER — FAMOTIDINE IN NACL 20-0.9 MG/50ML-% IV SOLN
20.0000 mg | Freq: Once | INTRAVENOUS | Status: AC
  Administered 2013-09-06: 20 mg via INTRAVENOUS

## 2013-09-06 MED ORDER — SODIUM CHLORIDE 0.9 % IV SOLN
533.0000 mg | Freq: Once | INTRAVENOUS | Status: AC
  Administered 2013-09-06: 530 mg via INTRAVENOUS
  Filled 2013-09-06: qty 53

## 2013-09-06 MED ORDER — SODIUM CHLORIDE 0.9 % IV SOLN
Freq: Once | INTRAVENOUS | Status: AC
  Administered 2013-09-06: 09:00:00 via INTRAVENOUS

## 2013-09-06 MED ORDER — FAMOTIDINE IN NACL 20-0.9 MG/50ML-% IV SOLN
INTRAVENOUS | Status: AC
  Filled 2013-09-06: qty 50

## 2013-09-06 MED ORDER — PACLITAXEL CHEMO INJECTION 300 MG/50ML
175.0000 mg/m2 | Freq: Once | INTRAVENOUS | Status: AC
  Administered 2013-09-06: 366 mg via INTRAVENOUS
  Filled 2013-09-06: qty 61

## 2013-09-06 MED ORDER — DEXAMETHASONE SODIUM PHOSPHATE 20 MG/5ML IJ SOLN
INTRAMUSCULAR | Status: AC
  Filled 2013-09-06: qty 5

## 2013-09-06 MED ORDER — ONDANSETRON 16 MG/50ML IVPB (CHCC)
16.0000 mg | Freq: Once | INTRAVENOUS | Status: AC
  Administered 2013-09-06: 16 mg via INTRAVENOUS

## 2013-09-06 NOTE — Patient Instructions (Signed)
[facility] Discharge Instructions for Patients Receiving Chemotherapy  Today you received the following chemotherapy agents: Taxol and Carboplatin.  To help prevent nausea and vomiting after your treatment, we encourage you to take your nausea medication as prescribed.   If you develop nausea and vomiting that is not controlled by your nausea medication, call the clinic.   BELOW ARE SYMPTOMS THAT SHOULD BE REPORTED IMMEDIATELY:  *FEVER GREATER THAN 100.5 F  *CHILLS WITH OR WITHOUT FEVER  NAUSEA AND VOMITING THAT IS NOT CONTROLLED WITH YOUR NAUSEA MEDICATION  *UNUSUAL SHORTNESS OF BREATH  *UNUSUAL BRUISING OR BLEEDING  TENDERNESS IN MOUTH AND THROAT WITH OR WITHOUT PRESENCE OF ULCERS  *URINARY PROBLEMS  *BOWEL PROBLEMS  UNUSUAL RASH Items with * indicate a potential emergency and should be followed up as soon as possible.  Feel free to call the clinic you have any questions or concerns. The clinic phone number is [phone].    

## 2013-09-06 NOTE — Progress Notes (Signed)
Dr. Marko Plume notified of K 3.3 and glc 196 upon today's labs. Per MD, verify patient is taking K supplement as prescribed; no correction for glucose. Pt informed and she verbalizes she is taking K at home and eating potassium in her diet.

## 2013-09-07 ENCOUNTER — Ambulatory Visit (HOSPITAL_BASED_OUTPATIENT_CLINIC_OR_DEPARTMENT_OTHER): Payer: 59

## 2013-09-07 VITALS — BP 122/60 | HR 61 | Temp 98.2°F

## 2013-09-07 DIAGNOSIS — D702 Other drug-induced agranulocytosis: Secondary | ICD-10-CM

## 2013-09-07 DIAGNOSIS — C7989 Secondary malignant neoplasm of other specified sites: Secondary | ICD-10-CM

## 2013-09-07 MED ORDER — PEGFILGRASTIM INJECTION 6 MG/0.6ML
6.0000 mg | Freq: Once | SUBCUTANEOUS | Status: AC
  Administered 2013-09-07: 6 mg via SUBCUTANEOUS
  Filled 2013-09-07: qty 0.6

## 2013-09-20 ENCOUNTER — Ambulatory Visit (HOSPITAL_BASED_OUTPATIENT_CLINIC_OR_DEPARTMENT_OTHER): Payer: 59 | Admitting: Adult Health

## 2013-09-20 ENCOUNTER — Ambulatory Visit (HOSPITAL_BASED_OUTPATIENT_CLINIC_OR_DEPARTMENT_OTHER): Payer: 59

## 2013-09-20 ENCOUNTER — Encounter: Payer: Self-pay | Admitting: Adult Health

## 2013-09-20 VITALS — BP 131/65 | HR 68 | Temp 97.8°F | Resp 18 | Ht 67.0 in | Wt 207.5 lb

## 2013-09-20 DIAGNOSIS — C7989 Secondary malignant neoplasm of other specified sites: Secondary | ICD-10-CM

## 2013-09-20 DIAGNOSIS — C541 Malignant neoplasm of endometrium: Secondary | ICD-10-CM

## 2013-09-20 DIAGNOSIS — C549 Malignant neoplasm of corpus uteri, unspecified: Secondary | ICD-10-CM

## 2013-09-20 LAB — CBC WITH DIFFERENTIAL/PLATELET
BASO%: 0.5 % (ref 0.0–2.0)
BASOS ABS: 0.1 10*3/uL (ref 0.0–0.1)
EOS ABS: 0.1 10*3/uL (ref 0.0–0.5)
EOS%: 0.9 % (ref 0.0–7.0)
HEMATOCRIT: 38.8 % (ref 34.8–46.6)
HGB: 12.5 g/dL (ref 11.6–15.9)
LYMPH%: 15.8 % (ref 14.0–49.7)
MCH: 30 pg (ref 25.1–34.0)
MCHC: 32.2 g/dL (ref 31.5–36.0)
MCV: 93.1 fL (ref 79.5–101.0)
MONO#: 1.4 10*3/uL — ABNORMAL HIGH (ref 0.1–0.9)
MONO%: 8.6 % (ref 0.0–14.0)
NEUT%: 74.2 % (ref 38.4–76.8)
NEUTROS ABS: 12.3 10*3/uL — AB (ref 1.5–6.5)
PLATELETS: 135 10*3/uL — AB (ref 145–400)
RBC: 4.17 10*6/uL (ref 3.70–5.45)
RDW: 18.2 % — ABNORMAL HIGH (ref 11.2–14.5)
WBC: 16.6 10*3/uL — ABNORMAL HIGH (ref 3.9–10.3)
lymph#: 2.6 10*3/uL (ref 0.9–3.3)

## 2013-09-20 LAB — COMPREHENSIVE METABOLIC PANEL (CC13)
ALBUMIN: 3.7 g/dL (ref 3.5–5.0)
ALK PHOS: 86 U/L (ref 40–150)
ALT: 18 U/L (ref 0–55)
AST: 23 U/L (ref 5–34)
Anion Gap: 7 mEq/L (ref 3–11)
BUN: 11.5 mg/dL (ref 7.0–26.0)
CO2: 31 mEq/L — ABNORMAL HIGH (ref 22–29)
CREATININE: 0.8 mg/dL (ref 0.6–1.1)
Calcium: 9.7 mg/dL (ref 8.4–10.4)
Chloride: 98 mEq/L (ref 98–109)
Glucose: 84 mg/dl (ref 70–140)
Potassium: 4.2 mEq/L (ref 3.5–5.1)
Sodium: 137 mEq/L (ref 136–145)
Total Bilirubin: 0.48 mg/dL (ref 0.20–1.20)
Total Protein: 6.7 g/dL (ref 6.4–8.3)

## 2013-09-20 NOTE — Progress Notes (Signed)
OFFICE PROGRESS NOTE   09/21/2013   Physicians:Brewster, Canary Brim, Lorri Frederick, Louie Casa ([facility])   INTERVAL HISTORY:  Patient is seen, together with husband, continuing attention to endometrial cancer recurrent to pelvis, with partial response to initial 3 cycles of taxol carboplatin given 06-26-13 thru 08-08-13 as confirmed by CT AP 08-28-13. She saw Dr Skeet Latch on 08-30-13, with recommendation for 3 additional cycles of this chemotherapy then repeat scans and follow up visit with gyn oncology.  Patient and husband are both willing to continue chemotherapy to try to maximize response.   Tierney returns today accompanied by her husband [name] after receiving her fourth of 6 planned cycles of Taxol and Carboplatin for her recurrence endometrial cancer.  She received this on 09/07/13.  She did have fatigue.  She has not had any nausea, and has been eating well.  She did have an episode of feeling anxious yesterday and it has now resolved.  She did have some intermittent numbness last week in her feet that has now resolved.  She denies fevers, chills, vomiting, constipation, diarrhea, skin changes or any further concerns.   She does not have PAC. She prefers peripheral IV access if possible.   ONCOLOGIC HISTORY Oncology History   [place] FIGO grade I endometrial carcinoma diagnosed 09-2010, treated with [place] BSO pelvic nodes 11-24-2010 . Recurrent in pelvis 04-2013 confirmed by biopsy     Endometrial cancer   11/02/2010 Initial Diagnosis Endometrial cancer  History goes back to 07-2010 when she presented with vaginal spotting. Endometrial biopsy by Dr Clovia Cuff 10-21-10 revealed endometroid carcinoma grade 1 focally involving polyp. She had robotic assisted total laparoscopic hysterectomy BSO and 20 pelvic node evaluation by Dr Skeet Latch on 11-24-2010. Pathology [phone]) had endometrial adenocarcinoma Grade I with focal squamous differentiation, superficial myometrial invasion ( 0.2 cm where  myometrium 1.4 cm); benign cervix, tubes and ovaries; no LVSI. With no high risk features, she was followed with observation only, including exams by gyn oncology every 6 months.  In early March 2015 she had acute onset of lower abdominal pain, then fever and nausea over next several days. She was treated by PCP for presumed diverticulitis with cipro and flagyl, initially with improvement then symptoms recurrent in late March. She had CT AP 05-23-13 showed a 4.1x4.1 cm mass left anterior upper pelvis, and CT biopsy 05-31-13 of mesenteric mass at level of umbilicus showed metastatic adenocarcinoma consistent with endometrioid carcinoma. Patient saw Dr [facility]et [facility] on [facility]-08-13, with exam remarkable for palpable right pelvic mass and recommendation for 3 cycles of taxane / platin chemotherapy, then repeat imaging; may also consider radiation after chemotherapy. The initial abdominal pain resolved entirely. Cycle 1 taxol carboplatin was given 06-26-2013; she was neutropenic with ANC 0 on day 14. Repeat CT AP after cycle 3 had partial response of dominant ventral peritoneal mass, right pelvic sidewall mass and implant along sigmoid colon. Recommendation was 3 additional cycles of chemotherapy.    Review of systems as above, also: A 10 point review of systems was conducted and is otherwise negative except for what is noted above.     Objective:  Vital signs in last 24 hours:  BP 131/65  Pulse 68  Temp(Src) 97.8 F (36.6 C) (Oral)  Resp 18  Ht 5\' 7"  (1.702 m)  Wt 207 lb 8 oz (94.121 kg)  BMI 32.49 kg/m2 GENERAL: Patient is a well appearing female in no acute distress HEENT:  Sclerae anicteric.  Oropharynx clear and moist. No ulcerations or evidence  of oropharyngeal candidiasis. Neck is supple.  NODES:  No cervical, supraclavicular, or axillary lymphadenopathy palpated.  BREAST EXAM:  Deferred. LUNGS:  Clear to auscultation bilaterally.  No wheezes or rhonchi. HEART:  Regular rate and rhythm. No murmur  appreciated. ABDOMEN:  Soft, nontender.  Positive, normoactive bowel sounds. No organomegaly palpated. MSK:  No focal spinal tenderness to palpation. Full range of motion bilaterally in the upper extremities. EXTREMITIES:  No peripheral edema.   SKIN:  Clear with no obvious rashes or skin changes. No nail dyscrasia. NEURO:  Nonfocal. Well oriented.  Appropriate affect.     Lab Results:  Results for orders placed in visit on 09/20/13  CBC WITH DIFFERENTIAL      Result Value Ref Range   WBC 16.6 (*) 3.9 - 10.3 10e3/uL   NEUT# 12.3 (*) 1.5 - 6.5 10e3/uL   HGB 12.5  11.6 - 15.9 g/dL   HCT 38.8  34.8 - 46.6 %   Platelets 135 (*) 145 - 400 10e3/uL   MCV 93.1  79.5 - 101.0 fL   MCH 30.0  25.1 - 34.0 pg   MCHC 32.2  31.5 - 36.0 g/dL   RBC 4.17  3.70 - 5.45 10e6/uL   RDW 18.2 (*) 11.2 - 14.5 %   lymph# 2.6  0.9 - 3.3 10e3/uL   MONO# 1.4 (*) 0.1 - 0.9 10e3/uL   Eosinophils Absolute 0.1  0.0 - 0.5 10e3/uL   Basophils Absolute 0.1  0.0 - 0.1 10e3/uL   NEUT% 74.2  38.4 - 76.8 %   LYMPH% 15.8  14.0 - 49.7 %   MONO% 8.6  0.0 - 14.0 %   EOS% 0.9  0.0 - 7.0 %   BASO% 0.5  0.0 - 2.0 %  COMPREHENSIVE METABOLIC PANEL (DE08)      Result Value Ref Range   Sodium 137  136 - 145 mEq/L   Potassium 4.2  3.5 - 5.1 mEq/L   Chloride 98  98 - 109 mEq/L   CO2 31 (*) 22 - 29 mEq/L   Glucose 84  70 - 140 mg/dl   BUN 11.5  7.0 - 26.0 mg/dL   Creatinine 0.8  0.6 - 1.1 mg/dL   Total Bilirubin 0.48  0.20 - 1.20 mg/dL   Alkaline Phosphatase 86  40 - 150 U/L   AST 23  5 - 34 U/L   ALT 18  0 - 55 U/L   Total Protein 6.7  6.4 - 8.3 g/dL   Albumin 3.7  3.5 - 5.0 g/dL   Calcium 9.7  8.4 - 10.4 mg/dL   Anion Gap 7  3 - 11 mEq/L    CMET available after visit normal including potassium 3.6 on supplement and creat 0.7   Studies/Results: CT ABDOMEN AND PELVIS WITH CONTRAST    COMPARISON: CT 05/22/2013  FINDINGS:  Lung bases are clear. No pericardial fluid.  No focal hepatic lesion. Gallbladder, pancreas,  spleen, adrenal  glands, and kidneys are normal.  Stomach, small bowel, appendix, and cecum are normal. The colon and  rectosigmoid colon are normal.  Abdominal aorta is normal caliber. There is no retroperitoneal  lymphadenopathy. Small periaortic lymph nodes are unchanged from  prior.  Lobular mass along the ventral peritoneal surface just below the  umbilicus measures 37 x 23 mm compared to 41 x 41 mm on prior for  some interval contraction. No new peritoneal lesions are identified.  In the pelvis, a soft tissue mass along the right pelvic sidewall  measures  13 x 10 mm decreased from 21 x 17 mm on prior (image 73,  series 2). There is a serosal implant along the sigmoid colon which  roughly measures 22 x 16 mm (image 73, series 2) decreased from 30  mm x 23 mm on prior. No evidence of obstruction of the colon.  Post hysterectomy anatomy. The bladder is normal. No pathologically  enlarged inguinal nodes. There is 11 mm left external iliac lymph  node unchanged prior.  No aggressive osseous lesion.  IMPRESSION:  1. Positive response to chemotherapy with no evidence of disease  progression.  2. Dominant ventral peritoneal masses decreased in volume.  3. Right pelvic sidewall mass and serosal implant along the sigmoid  colon are also decreased in size.   We have reviewed CT report as above, as they had additional questions since talking with Dr Skeet Latch.    Medications: Current Outpatient Prescriptions  Medication Sig Dispense Refill  . Ascorbic Acid (VITAMIN C CR) 1000 MG TBCR Take 1,000 mg by mouth daily.      . chlorthalidone (HYGROTON) 50 MG tablet Take 50 mg by mouth daily.      Marland Kitchen dexamethasone (DECADRON) 4 MG tablet Take 5 tablets (20mg ) with food 12 hours and 6 hours prior to chemotherapy  30 tablet  0  . loratadine (CLARITIN) 10 MG tablet Take 1 tablet (10 mg total) by mouth daily. For aches      . LORazepam (ATIVAN) 1 MG tablet 1/2 to 1 tablet every 6 hours as needed for  nausea. This will make you drowsy. Can swallow pill or let dissolve under tongue. Night of chemo, whether or not any nausea, take at bedtime  20 tablet  0  . ondansetron (ZOFRAN) 8 MG tablet 1-2 tablets every 12 hours as needed for nausea. Take 1 tablet the AM after chemo whether or not any nausea. This will not make you drowsy.  30 tablet  0  . OVER THE COUNTER MEDICATION Take 1-3 tablets by mouth daily. Curamin      . OVER THE COUNTER MEDICATION Take 3 tablets by mouth daily. [place] Bone Strength (calcium product)      . OVER THE COUNTER MEDICATION Take 1 packet by mouth 2 (two) times daily. [place] Fleeting Vitamin Pak (contains fish oil, potassium and multi vitamins)      . OVER THE COUNTER MEDICATION Intestinal Sooth and Build  Takes 3 tabs three times a day.      Marland Kitchen OVER THE COUNTER MEDICATION Stress J takes 3 tabs with each meal.      . OVER THE COUNTER MEDICATION Liver Care Takes 1 tabs twice a day      . OVER THE COUNTER MEDICATION Probiotics Takes 2 tabs daily.      Marland Kitchen OVER THE COUNTER MEDICATION Liquid chlorophyl Takes 2 tsp. in am      . OVER THE COUNTER MEDICATION Flax Seed Oil Takes 2 tlb. daily      . OVER THE COUNTER MEDICATION Flax seed  Takes 3 tlb. daily      . potassium chloride (K-DUR) 10 MEQ tablet Take 1 tablet (10 mEq total) by mouth 2 (two) times daily.  60 tablet  0  . Ubiquinol 100 MG CAPS Take 100 mg by mouth daily.      Marland Kitchen acyclovir ointment (ZOVIRAX) 5 % Apply to fever blister 5 times a day  30 g  1   No current facility-administered medications for this visit.    She is  due regular dermatology exam with Dr Tamala Julian at [facility] in [place], which is fine to keep.  Assessment:  1.Endometrial carcinoma initially IA grade 1 at surgery 11-2010, now with multifocal recurrence in pelvis. She has had 3 cycles of taxol carboplatin from 06-26-13 thru 08-08-13, partial response as above, and will now have 3 additional cycles followed by repeat  imaging. 2.chemo induced neutropenia (ANC 0 day 14 cycle1) managed with neulasta, which will continue with upcoming treatments 3.HTN: BP lower, holding antihypertensive based on BPs at home  4.fatty liver and obesity with BMI 32  5.diverticulosis  6.hypokalemia  Plan:   Irem is doing well today.  Her lab work is stable and I reviewed this with her in detail.  She did have one episode of intermittent numbness. Although it has since resolved, I did recommend she start taking Super B complex daily.  Her potassium is 4.2 today.  She is taking two 10 meq potassium tablets per day.  I recommended that she cut this back to one tablet per day.    Eknoor will return on September 27, 2013 for labs and cycle 5 of Paclitaxel and Carboplatin.   She knows to call us in the interim for any questions or concerns.  We can certainly see her sooner if needed.  I spent 25 minutes counseling the patient face to face.  The total time spent in the appointment was 30 minutes.  Minette Headland, [place] [phone] 09/21/2013, 6:58 PM

## 2013-09-25 ENCOUNTER — Other Ambulatory Visit: Payer: Self-pay

## 2013-09-27 ENCOUNTER — Other Ambulatory Visit (HOSPITAL_BASED_OUTPATIENT_CLINIC_OR_DEPARTMENT_OTHER): Payer: 59

## 2013-09-27 ENCOUNTER — Ambulatory Visit (HOSPITAL_BASED_OUTPATIENT_CLINIC_OR_DEPARTMENT_OTHER): Payer: 59

## 2013-09-27 VITALS — BP 152/73 | HR 84 | Temp 98.3°F | Resp 16

## 2013-09-27 DIAGNOSIS — C549 Malignant neoplasm of corpus uteri, unspecified: Secondary | ICD-10-CM

## 2013-09-27 DIAGNOSIS — Z5111 Encounter for antineoplastic chemotherapy: Secondary | ICD-10-CM

## 2013-09-27 DIAGNOSIS — C7989 Secondary malignant neoplasm of other specified sites: Secondary | ICD-10-CM

## 2013-09-27 DIAGNOSIS — C541 Malignant neoplasm of endometrium: Secondary | ICD-10-CM

## 2013-09-27 LAB — CBC WITH DIFFERENTIAL/PLATELET
BASO%: 0.2 % (ref 0.0–2.0)
Basophils Absolute: 0 10*3/uL (ref 0.0–0.1)
EOS ABS: 0 10*3/uL (ref 0.0–0.5)
EOS%: 0 % (ref 0.0–7.0)
HCT: 39.8 % (ref 34.8–46.6)
HEMOGLOBIN: 12.9 g/dL (ref 11.6–15.9)
LYMPH%: 14.2 % (ref 14.0–49.7)
MCH: 30.6 pg (ref 25.1–34.0)
MCHC: 32.5 g/dL (ref 31.5–36.0)
MCV: 93.9 fL (ref 79.5–101.0)
MONO#: 0.1 10*3/uL (ref 0.1–0.9)
MONO%: 1.2 % (ref 0.0–14.0)
NEUT%: 84.4 % — ABNORMAL HIGH (ref 38.4–76.8)
NEUTROS ABS: 5.1 10*3/uL (ref 1.5–6.5)
PLATELETS: 174 10*3/uL (ref 145–400)
RBC: 4.23 10*6/uL (ref 3.70–5.45)
RDW: 17.5 % — AB (ref 11.2–14.5)
WBC: 6.1 10*3/uL (ref 3.9–10.3)
lymph#: 0.9 10*3/uL (ref 0.9–3.3)

## 2013-09-27 LAB — COMPREHENSIVE METABOLIC PANEL (CC13)
ALT: 19 U/L (ref 0–55)
ANION GAP: 14 meq/L — AB (ref 3–11)
AST: 21 U/L (ref 5–34)
Albumin: 3.7 g/dL (ref 3.5–5.0)
Alkaline Phosphatase: 66 U/L (ref 40–150)
BUN: 12.2 mg/dL (ref 7.0–26.0)
CO2: 23 meq/L (ref 22–29)
Calcium: 9.7 mg/dL (ref 8.4–10.4)
Chloride: 100 mEq/L (ref 98–109)
Creatinine: 0.8 mg/dL (ref 0.6–1.1)
GLUCOSE: 189 mg/dL — AB (ref 70–140)
POTASSIUM: 3.3 meq/L — AB (ref 3.5–5.1)
SODIUM: 137 meq/L (ref 136–145)
TOTAL PROTEIN: 7 g/dL (ref 6.4–8.3)
Total Bilirubin: 0.56 mg/dL (ref 0.20–1.20)

## 2013-09-27 MED ORDER — PACLITAXEL CHEMO INJECTION 300 MG/50ML
175.0000 mg/m2 | Freq: Once | INTRAVENOUS | Status: AC
  Administered 2013-09-27: 366 mg via INTRAVENOUS
  Filled 2013-09-27: qty 61

## 2013-09-27 MED ORDER — FAMOTIDINE IN NACL 20-0.9 MG/50ML-% IV SOLN
20.0000 mg | Freq: Once | INTRAVENOUS | Status: AC
  Administered 2013-09-27: 20 mg via INTRAVENOUS

## 2013-09-27 MED ORDER — FAMOTIDINE IN NACL 20-0.9 MG/50ML-% IV SOLN
INTRAVENOUS | Status: AC
  Filled 2013-09-27: qty 50

## 2013-09-27 MED ORDER — ONDANSETRON 16 MG/50ML IVPB (CHCC)
INTRAVENOUS | Status: AC
  Filled 2013-09-27: qty 16

## 2013-09-27 MED ORDER — DIPHENHYDRAMINE HCL 50 MG/ML IJ SOLN
INTRAMUSCULAR | Status: AC
  Filled 2013-09-27: qty 1

## 2013-09-27 MED ORDER — DEXAMETHASONE SODIUM PHOSPHATE 20 MG/5ML IJ SOLN
INTRAMUSCULAR | Status: AC
  Filled 2013-09-27: qty 5

## 2013-09-27 MED ORDER — ONDANSETRON 16 MG/50ML IVPB (CHCC)
16.0000 mg | Freq: Once | INTRAVENOUS | Status: AC
  Administered 2013-09-27: 16 mg via INTRAVENOUS

## 2013-09-27 MED ORDER — SODIUM CHLORIDE 0.9 % IV SOLN
Freq: Once | INTRAVENOUS | Status: AC
  Administered 2013-09-27: 09:00:00 via INTRAVENOUS

## 2013-09-27 MED ORDER — SODIUM CHLORIDE 0.9 % IV SOLN
533.0000 mg | Freq: Once | INTRAVENOUS | Status: AC
  Administered 2013-09-27: 530 mg via INTRAVENOUS
  Filled 2013-09-27: qty 53

## 2013-09-27 MED ORDER — DIPHENHYDRAMINE HCL 12.5 MG/5ML PO ELIX
12.5000 mg | ORAL_SOLUTION | Freq: Once | ORAL | Status: AC
  Administered 2013-09-27: 12.5 mg via ORAL
  Filled 2013-09-27: qty 5

## 2013-09-27 MED ORDER — DEXAMETHASONE SODIUM PHOSPHATE 20 MG/5ML IJ SOLN
20.0000 mg | Freq: Once | INTRAMUSCULAR | Status: AC
  Administered 2013-09-27: 20 mg via INTRAVENOUS

## 2013-09-27 NOTE — Patient Instructions (Signed)
[facility] Discharge Instructions for Patients Receiving Chemotherapy  Today you received the following chemotherapy agents: Taxol and Carboplatin.  To help prevent nausea and vomiting after your treatment, we encourage you to take your nausea medication as prescribed.   If you develop nausea and vomiting that is not controlled by your nausea medication, call the clinic.   BELOW ARE SYMPTOMS THAT SHOULD BE REPORTED IMMEDIATELY:  *FEVER GREATER THAN 100.5 F  *CHILLS WITH OR WITHOUT FEVER  NAUSEA AND VOMITING THAT IS NOT CONTROLLED WITH YOUR NAUSEA MEDICATION  *UNUSUAL SHORTNESS OF BREATH  *UNUSUAL BRUISING OR BLEEDING  TENDERNESS IN MOUTH AND THROAT WITH OR WITHOUT PRESENCE OF ULCERS  *URINARY PROBLEMS  *BOWEL PROBLEMS  UNUSUAL RASH Items with * indicate a potential emergency and should be followed up as soon as possible.  Feel free to call the clinic you have any questions or concerns. The clinic phone number is [phone].    

## 2013-09-28 ENCOUNTER — Ambulatory Visit (HOSPITAL_BASED_OUTPATIENT_CLINIC_OR_DEPARTMENT_OTHER): Payer: 59

## 2013-09-28 ENCOUNTER — Telehealth: Payer: Self-pay

## 2013-09-28 VITALS — BP 130/59 | HR 65 | Temp 98.0°F

## 2013-09-28 DIAGNOSIS — Z5189 Encounter for other specified aftercare: Secondary | ICD-10-CM

## 2013-09-28 DIAGNOSIS — C549 Malignant neoplasm of corpus uteri, unspecified: Secondary | ICD-10-CM

## 2013-09-28 DIAGNOSIS — C7989 Secondary malignant neoplasm of other specified sites: Secondary | ICD-10-CM

## 2013-09-28 MED ORDER — PEGFILGRASTIM INJECTION 6 MG/0.6ML
6.0000 mg | Freq: Once | SUBCUTANEOUS | Status: AC
  Administered 2013-09-28: 6 mg via SUBCUTANEOUS
  Filled 2013-09-28: qty 0.6

## 2013-09-28 NOTE — Telephone Encounter (Signed)
Message copied by Baruch Merl on Fri Sep 28, 2013  6:52 PM ------      Message from: Evlyn Clines P      Created: Thu Sep 27, 2013  7:45 PM       Labs seen and need follow up: please let her know K is a little low again. She needs to increase supplemental K by 10-20 mEq daily above what she is taking now. ------

## 2013-10-01 ENCOUNTER — Other Ambulatory Visit: Payer: Self-pay | Admitting: Oncology

## 2013-10-01 NOTE — Telephone Encounter (Signed)
Spoke with Erika Strong regarding potassium level. She stated that Charlestine Massed NP told her that her KCL level on 09-20-13 was good at 4.2 and that she could reduce her KCL tabs to 10 meq daily. Dr. Marko Plume said that she could take 10 meq bid at this time and will evaluate the KCL level  At visit on 10-10-13.  Ms. Yiu verbalized understanding.

## 2013-10-04 ENCOUNTER — Telehealth: Payer: Self-pay | Admitting: *Deleted

## 2013-10-04 NOTE — Telephone Encounter (Signed)
Called patient to check in after patient called answering service last night regarding bruise/mark on right elbow. Patient states bruise is better this morning after she placed ice pack on elbow last night. Denies any further issues or problems. Patient told to call [facility] if area worsens. Patient agreeable to this and appreciative of call.

## 2013-10-09 ENCOUNTER — Other Ambulatory Visit: Payer: Self-pay | Admitting: Oncology

## 2013-10-10 ENCOUNTER — Telehealth: Payer: Self-pay | Admitting: Oncology

## 2013-10-10 ENCOUNTER — Telehealth: Payer: Self-pay | Admitting: *Deleted

## 2013-10-10 ENCOUNTER — Other Ambulatory Visit (HOSPITAL_BASED_OUTPATIENT_CLINIC_OR_DEPARTMENT_OTHER): Payer: Medicare Other

## 2013-10-10 ENCOUNTER — Ambulatory Visit (HOSPITAL_BASED_OUTPATIENT_CLINIC_OR_DEPARTMENT_OTHER): Payer: Medicare Other | Admitting: Oncology

## 2013-10-10 ENCOUNTER — Encounter: Payer: Self-pay | Admitting: Oncology

## 2013-10-10 VITALS — BP 141/71 | HR 76 | Temp 98.4°F | Resp 20 | Ht 67.0 in | Wt 207.1 lb

## 2013-10-10 DIAGNOSIS — C7989 Secondary malignant neoplasm of other specified sites: Secondary | ICD-10-CM

## 2013-10-10 DIAGNOSIS — C541 Malignant neoplasm of endometrium: Secondary | ICD-10-CM

## 2013-10-10 DIAGNOSIS — D702 Other drug-induced agranulocytosis: Secondary | ICD-10-CM

## 2013-10-10 DIAGNOSIS — I10 Essential (primary) hypertension: Secondary | ICD-10-CM

## 2013-10-10 DIAGNOSIS — E876 Hypokalemia: Secondary | ICD-10-CM

## 2013-10-10 DIAGNOSIS — C549 Malignant neoplasm of corpus uteri, unspecified: Secondary | ICD-10-CM

## 2013-10-10 LAB — CBC WITH DIFFERENTIAL/PLATELET
BASO%: 1 % (ref 0.0–2.0)
Basophils Absolute: 0.2 10*3/uL — ABNORMAL HIGH (ref 0.0–0.1)
EOS%: 1.3 % (ref 0.0–7.0)
Eosinophils Absolute: 0.2 10*3/uL (ref 0.0–0.5)
HCT: 39 % (ref 34.8–46.6)
HGB: 12.8 g/dL (ref 11.6–15.9)
LYMPH#: 2.9 10*3/uL (ref 0.9–3.3)
LYMPH%: 16.3 % (ref 14.0–49.7)
MCH: 30.9 pg (ref 25.1–34.0)
MCHC: 32.7 g/dL (ref 31.5–36.0)
MCV: 94.5 fL (ref 79.5–101.0)
MONO#: 1.4 10*3/uL — ABNORMAL HIGH (ref 0.1–0.9)
MONO%: 7.7 % (ref 0.0–14.0)
NEUT#: 13.2 10*3/uL — ABNORMAL HIGH (ref 1.5–6.5)
NEUT%: 73.7 % (ref 38.4–76.8)
Platelets: 183 10*3/uL (ref 145–400)
RBC: 4.13 10*6/uL (ref 3.70–5.45)
RDW: 17.1 % — ABNORMAL HIGH (ref 11.2–14.5)
WBC: 17.8 10*3/uL — AB (ref 3.9–10.3)

## 2013-10-10 LAB — COMPREHENSIVE METABOLIC PANEL (CC13)
ALT: 20 U/L (ref 0–55)
ANION GAP: 10 meq/L (ref 3–11)
AST: 25 U/L (ref 5–34)
Albumin: 3.8 g/dL (ref 3.5–5.0)
Alkaline Phosphatase: 99 U/L (ref 40–150)
BILIRUBIN TOTAL: 0.27 mg/dL (ref 0.20–1.20)
BUN: 11.1 mg/dL (ref 7.0–26.0)
CALCIUM: 9.7 mg/dL (ref 8.4–10.4)
CHLORIDE: 99 meq/L (ref 98–109)
CO2: 30 meq/L — AB (ref 22–29)
Creatinine: 0.7 mg/dL (ref 0.6–1.1)
Glucose: 92 mg/dl (ref 70–140)
Potassium: 3.5 mEq/L (ref 3.5–5.1)
Sodium: 139 mEq/L (ref 136–145)
Total Protein: 7 g/dL (ref 6.4–8.3)

## 2013-10-10 MED ORDER — POTASSIUM CHLORIDE ER 10 MEQ PO TBCR: 10.0000 meq | EXTENDED_RELEASE_TABLET | Freq: Two times a day (BID) | ORAL | Status: DC

## 2013-10-10 NOTE — Telephone Encounter (Signed)
per pof to sch appt-sch & gave pt copy of sch

## 2013-10-10 NOTE — Telephone Encounter (Signed)
Per staff phone call and POF I have schedueld appts. Scheduler advised of appts.  JMW  

## 2013-10-10 NOTE — Progress Notes (Signed)
OFFICE PROGRESS NOTE   10/10/2013   Physicians:Brewster, Canary Brim, Lorri Frederick, Louie Casa ([facility])   INTERVAL HISTORY:  Patient is seen, together with husband, in continuing attention to endometrial carcinoma recurrent to pelvis almost 3 years after original diagnosis, now having had cycle 5 carboplatin taxol on 09-27-13, with neulasta on day 2. With partial response after 3 cycles of chemo, plan is to repeat scans after 6 cycles of this chemotherapy.  She has tolerated the chemotherapy fairly well, with fatigue for 7-10 days after each treatment and slight numbness now at metatarsal heads which is not bothersome when walking now, but is persistent and very noticeable at night. She has had no significant nausea. She denies discomfort in abdomen or pelvis. Bowels are moving regularly.  Peripheral IV access has been adequate for treatment. She does not have PAC.   ONCOLOGIC HISTORY Oncology History   [place] FIGO grade I endometrial carcinoma diagnosed 09-2010, treated with [place] BSO pelvic nodes 11-24-2010 . Recurrent in pelvis 04-2013 confirmed by biopsy     Endometrial cancer   11/02/2010 Initial Diagnosis Endometrial cancer  History goes back to 07-2010 when she presented with vaginal spotting. Endometrial biopsy by Dr Clovia Cuff 10-21-10 revealed endometroid carcinoma grade 1 focally involving polyp. She had robotic assisted total laparoscopic hysterectomy BSO and 20 pelvic node evaluation by Dr Skeet Latch on 11-24-2010. Pathology [phone]) had endometrial adenocarcinoma Grade I with focal squamous differentiation, superficial myometrial invasion ( 0.2 cm where myometrium 1.4 cm); benign cervix, tubes and ovaries; no LVSI. With no high risk features, she was followed with observation only, including exams by gyn oncology every 6 months.  In early March 2015 she had acute onset of lower abdominal pain, then fever and nausea over next several days. She was treated by PCP for presumed  diverticulitis with cipro and flagyl, initially with improvement then symptoms recurrent in late March. She had CT AP 05-23-13 showed a 4.1x4.1 cm mass left anterior upper pelvis, and CT biopsy 05-31-13 of mesenteric mass at level of umbilicus showed metastatic adenocarcinoma consistent with endometrioid carcinoma. Patient saw Dr Skeet Latch at [facility] on 06-08-13, with exam remarkable for palpable right pelvic mass and recommendation for 3 cycles of taxane / platin chemotherapy, then repeat imaging; may also consider radiation after chemotherapy. The initial abdominal pain resolved entirely. Cycle 1 taxol carboplatin was given 06-26-2013; she was neutropenic with ANC 0 on day 14. Repeat CT AP after cycle 3 had partial response of dominant ventral peritoneal mass, right pelvic sidewall mass and implant along sigmoid colon. Recommendation was 3 additional cycles of chemotherapy   Review of systems as above, also: No fever or symptoms of infection. No neuropathy in hands. No SOB, no new or different pain. No LE swelling. No bleeding. Discolored area right forearm since after last chemo, not tender or red, improved with ASA and ice. Remainder of 10 point Review of Systems negative.  Objective:  Vital signs in last 24 hours: Weight is stable at 207, BMI 32.5.  141/71, 76 regular, 20 not labored RA, 98.4  Alert, oriented and appropriate. Ambulatory without assistance difficulty.  Alopecia  HEENT:PERRL, sclerae not icteric. Oral mucosa moist without lesions, posterior pharynx clear.  Neck supple. No JVD.  Lymphatics:no cervical,suraclavicular, axillary or inguinal adenopathy Resp: clear to auscultation bilaterally and normal percussion bilaterally Cardio: regular rate and rhythm. No gallop. GI: soft, nontender, not distended, no mass or organomegaly. Normally active bowel sounds. Surgical incision not remarkable. Musculoskeletal/ Extremities: without pitting edema or tenderness. ~  2-3 cm resolving superficial  thrombophlebitis right upper forearm away from last IV site. Neuro: no significant peripheral neuropathy. Otherwise nonfocal Skin without rash, ecchymosis, petechiae   Lab Results:  Results for orders placed in visit on 10/10/13  CBC WITH DIFFERENTIAL      Result Value Ref Range   WBC 17.8 (*) 3.9 - 10.3 10e3/uL   NEUT# 13.2 (*) 1.5 - 6.5 10e3/uL   HGB 12.8  11.6 - 15.9 g/dL   HCT 39.0  34.8 - 46.6 %   Platelets 183  145 - 400 10e3/uL   MCV 94.5  79.5 - 101.0 fL   MCH 30.9  25.1 - 34.0 pg   MCHC 32.7  31.5 - 36.0 g/dL   RBC 4.13  3.70 - 5.45 10e6/uL   RDW 17.1 (*) 11.2 - 14.5 %   lymph# 2.9  0.9 - 3.3 10e3/uL   MONO# 1.4 (*) 0.1 - 0.9 10e3/uL   Eosinophils Absolute 0.2  0.0 - 0.5 10e3/uL   Basophils Absolute 0.2 (*) 0.0 - 0.1 10e3/uL   NEUT% 73.7  38.4 - 76.8 %   LYMPH% 16.3  14.0 - 49.7 %   MONO% 7.7  0.0 - 14.0 %   EOS% 1.3  0.0 - 7.0 %   BASO% 1.0  0.0 - 2.0 %    CMET available after visit normal (except CO2 30) including K 3.5  Studies/Results:  Will obtain CT ~ 3-4 weeks after last chemo; could consider PET prior to Dr Leone Brand visit in Oct depending on CT results.  Medications: I have reviewed the patient's current medications. With potassium just low normal on 20 mEq daily will continue this dose. She seemed to feel better without claritin with most recent treatment cycle.   DISCUSSION: likely superficial vein irritation related to more distal administration of chemo, resolving. Discussed NSAID and warm soaks if similar problems in future.   Assessment/Plan:  1.Endometrial carcinoma initially IA grade 1 at surgery 11-2010, now with multifocal recurrence in pelvis. She will have cycle 6 carbo taxol on 10-18-13 with neulasta 8-28, then repeat CT in 3-4 weeks. Next appointment with Dr Skeet Latch is 12-27-13, so we will need to let her know results of CT prior. 2.chemo induced neutropenia (ANC 0 day 14 cycle1) managed with neulasta, which will continue with upcoming  treatments  3.HTN 4.fatty liver and obesity  5.diverticulosis: not symptomatic now. 6.hypokalemia improved: continue K supplements    Orders placed for cycle 6 carbo taxol, with taxol decreased from 175 mg/m2 as used cycles 1-5, to 135 mg/m2 due to neuropathy in feet.  Time spent 25 min including >50% counseling and coordination of care.   LIVESAY,LENNIS P, MD   10/10/2013, 1:04 PM

## 2013-10-17 ENCOUNTER — Other Ambulatory Visit: Payer: Self-pay

## 2013-10-17 DIAGNOSIS — C541 Malignant neoplasm of endometrium: Secondary | ICD-10-CM

## 2013-10-18 ENCOUNTER — Other Ambulatory Visit (HOSPITAL_BASED_OUTPATIENT_CLINIC_OR_DEPARTMENT_OTHER): Payer: Medicare Other

## 2013-10-18 ENCOUNTER — Ambulatory Visit: Payer: Medicare Other

## 2013-10-18 ENCOUNTER — Ambulatory Visit (HOSPITAL_BASED_OUTPATIENT_CLINIC_OR_DEPARTMENT_OTHER): Payer: Medicare Other

## 2013-10-18 VITALS — BP 140/73 | HR 84 | Temp 97.7°F | Resp 20

## 2013-10-18 DIAGNOSIS — C549 Malignant neoplasm of corpus uteri, unspecified: Secondary | ICD-10-CM

## 2013-10-18 DIAGNOSIS — C541 Malignant neoplasm of endometrium: Secondary | ICD-10-CM

## 2013-10-18 DIAGNOSIS — Z5111 Encounter for antineoplastic chemotherapy: Secondary | ICD-10-CM

## 2013-10-18 LAB — CBC WITH DIFFERENTIAL/PLATELET
BASO%: 0.2 % (ref 0.0–2.0)
BASOS ABS: 0 10*3/uL (ref 0.0–0.1)
EOS%: 0 % (ref 0.0–7.0)
Eosinophils Absolute: 0 10*3/uL (ref 0.0–0.5)
HEMATOCRIT: 39.3 % (ref 34.8–46.6)
HEMOGLOBIN: 13 g/dL (ref 11.6–15.9)
LYMPH%: 13.2 % — AB (ref 14.0–49.7)
MCH: 31.7 pg (ref 25.1–34.0)
MCHC: 33.2 g/dL (ref 31.5–36.0)
MCV: 95.4 fL (ref 79.5–101.0)
MONO#: 0 10*3/uL — ABNORMAL LOW (ref 0.1–0.9)
MONO%: 0.8 % (ref 0.0–14.0)
NEUT#: 5 10*3/uL (ref 1.5–6.5)
NEUT%: 85.8 % — AB (ref 38.4–76.8)
PLATELETS: 174 10*3/uL (ref 145–400)
RBC: 4.12 10*6/uL (ref 3.70–5.45)
RDW: 16.6 % — ABNORMAL HIGH (ref 11.2–14.5)
WBC: 5.9 10*3/uL (ref 3.9–10.3)
lymph#: 0.8 10*3/uL — ABNORMAL LOW (ref 0.9–3.3)

## 2013-10-18 LAB — COMPREHENSIVE METABOLIC PANEL (CC13)
ALT: 18 U/L (ref 0–55)
ANION GAP: 14 meq/L — AB (ref 3–11)
AST: 21 U/L (ref 5–34)
Albumin: 3.8 g/dL (ref 3.5–5.0)
Alkaline Phosphatase: 72 U/L (ref 40–150)
BUN: 12.3 mg/dL (ref 7.0–26.0)
CALCIUM: 9.7 mg/dL (ref 8.4–10.4)
CHLORIDE: 101 meq/L (ref 98–109)
CO2: 22 mEq/L (ref 22–29)
CREATININE: 0.7 mg/dL (ref 0.6–1.1)
Glucose: 184 mg/dl — ABNORMAL HIGH (ref 70–140)
Potassium: 3.3 mEq/L — ABNORMAL LOW (ref 3.5–5.1)
Sodium: 136 mEq/L (ref 136–145)
Total Bilirubin: 0.53 mg/dL (ref 0.20–1.20)
Total Protein: 7.2 g/dL (ref 6.4–8.3)

## 2013-10-18 MED ORDER — DIPHENHYDRAMINE HCL 12.5 MG/5ML PO ELIX
12.5000 mg | ORAL_SOLUTION | Freq: Once | ORAL | Status: AC
  Administered 2013-10-18: 12.5 mg via ORAL
  Filled 2013-10-18: qty 5

## 2013-10-18 MED ORDER — SODIUM CHLORIDE 0.9 % IV SOLN
533.0000 mg | Freq: Once | INTRAVENOUS | Status: AC
  Administered 2013-10-18: 530 mg via INTRAVENOUS
  Filled 2013-10-18: qty 53

## 2013-10-18 MED ORDER — DEXAMETHASONE SODIUM PHOSPHATE 20 MG/5ML IJ SOLN
20.0000 mg | Freq: Once | INTRAMUSCULAR | Status: AC
  Administered 2013-10-18: 20 mg via INTRAVENOUS

## 2013-10-18 MED ORDER — FAMOTIDINE IN NACL 20-0.9 MG/50ML-% IV SOLN
20.0000 mg | Freq: Once | INTRAVENOUS | Status: AC
  Administered 2013-10-18: 20 mg via INTRAVENOUS

## 2013-10-18 MED ORDER — FAMOTIDINE IN NACL 20-0.9 MG/50ML-% IV SOLN
INTRAVENOUS | Status: AC
  Filled 2013-10-18: qty 50

## 2013-10-18 MED ORDER — ONDANSETRON 16 MG/50ML IVPB (CHCC)
INTRAVENOUS | Status: AC
  Filled 2013-10-18: qty 16

## 2013-10-18 MED ORDER — ONDANSETRON 16 MG/50ML IVPB (CHCC)
16.0000 mg | Freq: Once | INTRAVENOUS | Status: AC
  Administered 2013-10-18: 16 mg via INTRAVENOUS

## 2013-10-18 MED ORDER — SODIUM CHLORIDE 0.9 % IV SOLN
Freq: Once | INTRAVENOUS | Status: AC
  Administered 2013-10-18: 09:00:00 via INTRAVENOUS

## 2013-10-18 MED ORDER — DEXTROSE 5 % IV SOLN
135.0000 mg/m2 | Freq: Once | INTRAVENOUS | Status: AC
  Administered 2013-10-18: 282 mg via INTRAVENOUS
  Filled 2013-10-18: qty 47

## 2013-10-18 MED ORDER — DEXAMETHASONE SODIUM PHOSPHATE 20 MG/5ML IJ SOLN
INTRAMUSCULAR | Status: AC
  Filled 2013-10-18: qty 5

## 2013-10-18 NOTE — Patient Instructions (Signed)
[facility] Discharge Instructions for Patients Receiving Chemotherapy  Today you received the following chemotherapy agents Taxol and Carboplatin.  To help prevent nausea and vomiting after your treatment, we encourage you to take your nausea medication.   If you develop nausea and vomiting that is not controlled by your nausea medication, call the clinic.   BELOW ARE SYMPTOMS THAT SHOULD BE REPORTED IMMEDIATELY:  *FEVER GREATER THAN 100.5 F  *CHILLS WITH OR WITHOUT FEVER  NAUSEA AND VOMITING THAT IS NOT CONTROLLED WITH YOUR NAUSEA MEDICATION  *UNUSUAL SHORTNESS OF BREATH  *UNUSUAL BRUISING OR BLEEDING  TENDERNESS IN MOUTH AND THROAT WITH OR WITHOUT PRESENCE OF ULCERS  *URINARY PROBLEMS  *BOWEL PROBLEMS  UNUSUAL RASH Items with * indicate a potential emergency and should be followed up as soon as possible.  Feel free to call the clinic you have any questions or concerns. The clinic phone number is [phone].    

## 2013-10-19 ENCOUNTER — Ambulatory Visit (HOSPITAL_BASED_OUTPATIENT_CLINIC_OR_DEPARTMENT_OTHER): Payer: Medicare Other

## 2013-10-19 VITALS — BP 136/65 | HR 72 | Temp 98.0°F

## 2013-10-19 DIAGNOSIS — Z5189 Encounter for other specified aftercare: Secondary | ICD-10-CM

## 2013-10-19 DIAGNOSIS — C7989 Secondary malignant neoplasm of other specified sites: Secondary | ICD-10-CM

## 2013-10-19 DIAGNOSIS — C549 Malignant neoplasm of corpus uteri, unspecified: Secondary | ICD-10-CM

## 2013-10-19 MED ORDER — PEGFILGRASTIM INJECTION 6 MG/0.6ML
6.0000 mg | Freq: Once | SUBCUTANEOUS | Status: AC
  Administered 2013-10-19: 6 mg via SUBCUTANEOUS
  Filled 2013-10-19: qty 0.6

## 2013-10-29 ENCOUNTER — Other Ambulatory Visit: Payer: Self-pay | Admitting: Oncology

## 2013-10-30 ENCOUNTER — Telehealth: Payer: Self-pay | Admitting: *Deleted

## 2013-10-30 DIAGNOSIS — C549 Malignant neoplasm of corpus uteri, unspecified: Secondary | ICD-10-CM

## 2013-10-30 DIAGNOSIS — C541 Malignant neoplasm of endometrium: Secondary | ICD-10-CM

## 2013-10-30 DIAGNOSIS — C7989 Secondary malignant neoplasm of other specified sites: Secondary | ICD-10-CM

## 2013-10-30 MED ORDER — LORAZEPAM 1 MG PO TABS: ORAL_TABLET | ORAL | Status: DC

## 2013-10-30 NOTE — Telephone Encounter (Signed)
Patient left voicemail stating she has been nauseated intermittently after last chemo treatment on 10/18/13. Called patient back and spoke with her. Advised patient that chemotherapy is cumulative and some people do notice more symptoms including nausea after receiving more treatments. Advised pt to take zofran today in the morning and again late afternoon/evening to try and keep from getting nauseated. Also advised patient to continue drinking lots of fluids as to stay hydrated. Patient agreeable to this and confirmed she will be at appointment tomorrow with Dr. Marko Plume.

## 2013-10-31 ENCOUNTER — Encounter: Payer: Self-pay | Admitting: Oncology

## 2013-10-31 ENCOUNTER — Telehealth: Payer: Self-pay | Admitting: Oncology

## 2013-10-31 ENCOUNTER — Other Ambulatory Visit (HOSPITAL_BASED_OUTPATIENT_CLINIC_OR_DEPARTMENT_OTHER): Payer: Medicare Other

## 2013-10-31 ENCOUNTER — Ambulatory Visit (HOSPITAL_BASED_OUTPATIENT_CLINIC_OR_DEPARTMENT_OTHER): Payer: Medicare Other | Admitting: Oncology

## 2013-10-31 VITALS — BP 131/62 | HR 78 | Temp 98.9°F | Resp 20 | Ht 67.0 in | Wt 209.5 lb

## 2013-10-31 DIAGNOSIS — G620 Drug-induced polyneuropathy: Secondary | ICD-10-CM

## 2013-10-31 DIAGNOSIS — E876 Hypokalemia: Secondary | ICD-10-CM

## 2013-10-31 DIAGNOSIS — C549 Malignant neoplasm of corpus uteri, unspecified: Secondary | ICD-10-CM

## 2013-10-31 DIAGNOSIS — C541 Malignant neoplasm of endometrium: Secondary | ICD-10-CM

## 2013-10-31 DIAGNOSIS — Z23 Encounter for immunization: Secondary | ICD-10-CM

## 2013-10-31 LAB — CBC WITH DIFFERENTIAL/PLATELET
BASO%: 0.3 % (ref 0.0–2.0)
Basophils Absolute: 0.1 10*3/uL (ref 0.0–0.1)
EOS%: 0.8 % (ref 0.0–7.0)
Eosinophils Absolute: 0.1 10*3/uL (ref 0.0–0.5)
HCT: 37 % (ref 34.8–46.6)
HGB: 11.8 g/dL (ref 11.6–15.9)
LYMPH#: 2.5 10*3/uL (ref 0.9–3.3)
LYMPH%: 13.9 % — ABNORMAL LOW (ref 14.0–49.7)
MCH: 30.9 pg (ref 25.1–34.0)
MCHC: 31.9 g/dL (ref 31.5–36.0)
MCV: 97 fL (ref 79.5–101.0)
MONO#: 1.4 10*3/uL — ABNORMAL HIGH (ref 0.1–0.9)
MONO%: 7.7 % (ref 0.0–14.0)
NEUT#: 14 10*3/uL — ABNORMAL HIGH (ref 1.5–6.5)
NEUT%: 77.3 % — ABNORMAL HIGH (ref 38.4–76.8)
Platelets: 168 10*3/uL (ref 145–400)
RBC: 3.81 10*6/uL (ref 3.70–5.45)
RDW: 16.2 % — AB (ref 11.2–14.5)
WBC: 18.1 10*3/uL — ABNORMAL HIGH (ref 3.9–10.3)

## 2013-10-31 LAB — COMPREHENSIVE METABOLIC PANEL (CC13)
ALK PHOS: 96 U/L (ref 40–150)
ALT: 18 U/L (ref 0–55)
AST: 21 U/L (ref 5–34)
Albumin: 3.6 g/dL (ref 3.5–5.0)
Anion Gap: 10 mEq/L (ref 3–11)
BUN: 8.4 mg/dL (ref 7.0–26.0)
CALCIUM: 9.3 mg/dL (ref 8.4–10.4)
CHLORIDE: 99 meq/L (ref 98–109)
CO2: 30 mEq/L — ABNORMAL HIGH (ref 22–29)
Creatinine: 0.9 mg/dL (ref 0.6–1.1)
Glucose: 89 mg/dl (ref 70–140)
POTASSIUM: 3.4 meq/L — AB (ref 3.5–5.1)
Sodium: 140 mEq/L (ref 136–145)
Total Bilirubin: 0.33 mg/dL (ref 0.20–1.20)
Total Protein: 6.6 g/dL (ref 6.4–8.3)

## 2013-10-31 MED ORDER — INFLUENZA VAC SPLIT QUAD 0.5 ML IM SUSY
0.5000 mL | PREFILLED_SYRINGE | Freq: Once | INTRAMUSCULAR | Status: AC
  Administered 2013-10-31: 0.5 mL via INTRAMUSCULAR
  Filled 2013-10-31: qty 0.5

## 2013-10-31 NOTE — Progress Notes (Signed)
OFFICE PROGRESS NOTE   10/31/2013   Physicians:Brewster, Canary Brim, Lorri Frederick, Louie Casa ([facility])   INTERVAL HISTORY:  Patient is seen, together with husband, in follow up of chemotherapy given for recurrent endometrial carcinoma, cycle 6 carboplatin and taxol given 10-18-13, with neulasta 10-19-13.  Plan is to repeat scans after these 6 cycles of chemotherapy, then possibly consideration of radiation. CTs will be late Sept; she is presently scheduled back to Dr Skeet Latch in early Nov.  Patient had intermittent nausea for a week following chemotherapy, which is the most that she has had. She was fatigued for much of that time, but is feeling a little better now and has been able to do some home activities. She has numbness now toes and distal feet bilaterally, none in hands. She has been able to eat and drink fluids this week, and bowels are moving.  She does not have PAC. Flu vaccine was given today.    ONCOLOGIC HISTORY Oncology History   [place] FIGO grade I endometrial carcinoma diagnosed 09-2010, treated with [place] BSO pelvic nodes 11-24-2010 . Recurrent in pelvis 04-2013 confirmed by biopsy     Endometrial cancer   11/02/2010 Initial Diagnosis Endometrial cancer  History goes back to 07-2010 when she presented with vaginal spotting. Endometrial biopsy by Dr Clovia Cuff 10-21-10 revealed endometroid carcinoma grade 1 focally involving polyp. She had robotic assisted total laparoscopic hysterectomy BSO and 20 pelvic node evaluation by Dr Skeet Latch on 11-24-2010. Pathology [id number]) had endometrial adenocarcinoma Grade I with focal squamous differentiation, superficial myometrial invasion ( 0.2 cm where myometrium 1.4 cm); benign cervix, tubes and ovaries; no LVSI. With no high risk features, she was followed with observation only, including exams by gyn oncology every 6 months.  In early March 2015 she had acute onset of lower abdominal pain, then fever and nausea over next several  days. She was treated by PCP for presumed diverticulitis with cipro and flagyl, initially with improvement then symptoms recurrent in late March. She had CT AP 05-23-13 showed a 4.1x4.1 cm mass left anterior upper pelvis, and CT biopsy 05-31-13 of mesenteric mass at level of umbilicus showed metastatic adenocarcinoma consistent with endometrioid carcinoma. Patient saw Dr Skeet Latch at [facility] on 06-08-13, with exam remarkable for palpable right pelvic mass and recommendation for 3 cycles of taxane / platin chemotherapy, then repeat imaging; may also consider radiation after chemotherapy. The initial abdominal pain resolved entirely. Cycle 1 taxol carboplatin was given 06-26-2013; she was neutropenic with ANC 0 on day 14. Repeat CT AP after cycle 3 had partial response of dominant ventral peritoneal mass, right pelvic sidewall mass and implant along sigmoid colon. She completed additional 3 cycles of carboplatin and taxol for total 6 cycles on 10-18-13, with neulasta support.   Review of systems as above, also: No fever or symptoms of infection. Not SOB at rest, no cough. No abdominal or pelvic pain. No bleeding.  Remainder of 10 point Review of Systems negative.  Objective:  Vital signs in last 24 hours:  BP 131/62  Pulse 78  Temp(Src) 98.9 F (37.2 C) (Oral)  Resp 20  Ht 5\' 7"  (1.702 m)  Wt 209 lb 8 oz (95.029 kg)  BMI 32.80 kg/m2 weight is up 2 lbs  Alert, oriented and appropriate, very pleasant as always. Ambulatory without difficulty. Does not appear energetic, but NAD. Alopecia  HEENT:PERRL, sclerae not icteric. Oral mucosa moist without lesions, posterior pharynx clear.  Neck supple. No JVD.  Lymphatics:no cervical,suraclavicular or inguinal adenopathy Resp: clear  to auscultation bilaterally and normal percussion bilaterally Cardio: regular rate and rhythm. No gallop. GI: soft, nontender, not distended, no mass or organomegaly. Normally active bowel sounds. Surgical incisions not  remarkable. Musculoskeletal/ Extremities: without pitting edema, cords, tenderness Neuro:peripheral neuropathy distal feet and toes as noted. Otherwise nonfocal. PSYCH appropriate mood and affect Skin without rash, ecchymosis, petechiae   Lab Results:  Results for orders placed in visit on 10/31/13  CBC WITH DIFFERENTIAL      Result Value Ref Range   WBC 18.1 (*) 3.9 - 10.3 10e3/uL   NEUT# 14.0 (*) 1.5 - 6.5 10e3/uL   HGB 11.8  11.6 - 15.9 g/dL   HCT 37.0  34.8 - 46.6 %   Platelets 168  145 - 400 10e3/uL   MCV 97.0  79.5 - 101.0 fL   MCH 30.9  25.1 - 34.0 pg   MCHC 31.9  31.5 - 36.0 g/dL   RBC 3.81  3.70 - 5.45 10e6/uL   RDW 16.2 (*) 11.2 - 14.5 %   lymph# 2.5  0.9 - 3.3 10e3/uL   MONO# 1.4 (*) 0.1 - 0.9 10e3/uL   Eosinophils Absolute 0.1  0.0 - 0.5 10e3/uL   Basophils Absolute 0.1  0.0 - 0.1 10e3/uL   NEUT% 77.3 (*) 38.4 - 76.8 %   LYMPH% 13.9 (*) 14.0 - 49.7 %   MONO% 7.7  0.0 - 14.0 %   EOS% 0.8  0.0 - 7.0 %   BASO% 0.3  0.0 - 2.0 %  COMPREHENSIVE METABOLIC PANEL (BT59)      Result Value Ref Range   Sodium 140  136 - 145 mEq/L   Potassium 3.4 (*) 3.5 - 5.1 mEq/L   Chloride 99  98 - 109 mEq/L   CO2 30 (*) 22 - 29 mEq/L   Glucose 89  70 - 140 mg/dl   BUN 8.4  7.0 - 26.0 mg/dL   Creatinine 0.9  0.6 - 1.1 mg/dL   Total Bilirubin 0.33  0.20 - 1.20 mg/dL   Alkaline Phosphatase 96  40 - 150 U/L   AST 21  5 - 34 U/L   ALT 18  0 - 55 U/L   Total Protein 6.6  6.4 - 8.3 g/dL   Albumin 3.6  3.5 - 5.0 g/dL   Calcium 9.3  8.4 - 10.4 mg/dL   Anion Gap 10  3 - 11 mEq/L     Studies/Results:  No results found. CT AP planned 11-15-13  Medications: I have reviewed the patient's current medications. She will continue potassium bid.  Flu vaccine given today.  DISCUSSION: She understands that she will likely feel gradually better out from chemotherapy, but may take several months to feel that she is fully recovered.   Assessment/Plan: 1.Endometrial carcinoma initially IA grade 1  at surgery 11-2010, then multifocal recurrence in pelvis found April 2015. Now post 6 cycles of carboplatin and taxol, for repeat CT and I will see her back after that but will need to let Dr Skeet Latch know results also. Per Dr Leone Brand initial consultation, may consider radiation after chemotherapy. 2.taxol related peripheral neuropathy: hopefully will improve out from treatment. Instructed to massage and exercise feet regularly 3.HTN  4.fatty liver and obesity  5.diverticulosis: not symptomatic now.  6.hypokalemia improved: continue K supplements    Patient and husband understood discussion and are in agreement with plan as above.    Jazline Cumbee P, MD   10/31/2013, 4:04 PM

## 2013-10-31 NOTE — Telephone Encounter (Signed)
, °

## 2013-11-01 ENCOUNTER — Encounter: Payer: Self-pay | Admitting: Oncology

## 2013-11-13 ENCOUNTER — Other Ambulatory Visit: Payer: Self-pay | Admitting: Oncology

## 2013-11-15 ENCOUNTER — Encounter (HOSPITAL_COMMUNITY): Payer: Self-pay

## 2013-11-15 ENCOUNTER — Ambulatory Visit (HOSPITAL_COMMUNITY)
Admission: RE | Admit: 2013-11-15 | Discharge: 2013-11-15 | Disposition: A | Discharge status: Home / Self Care | Payer: Medicare Other | Source: Ambulatory Visit | Attending: Oncology | Admitting: Oncology

## 2013-11-15 DIAGNOSIS — C541 Malignant neoplasm of endometrium: Secondary | ICD-10-CM

## 2013-11-15 DIAGNOSIS — C549 Malignant neoplasm of corpus uteri, unspecified: Secondary | ICD-10-CM | POA: Diagnosis present

## 2013-11-15 MED ORDER — IOHEXOL 300 MG/ML  SOLN
100.0000 mL | Freq: Once | INTRAMUSCULAR | Status: AC | PRN
  Administered 2013-11-15: 100 mL via INTRAVENOUS

## 2013-11-19 ENCOUNTER — Other Ambulatory Visit (HOSPITAL_BASED_OUTPATIENT_CLINIC_OR_DEPARTMENT_OTHER): Payer: Medicare Other

## 2013-11-19 ENCOUNTER — Telehealth: Payer: Self-pay | Admitting: Oncology

## 2013-11-19 ENCOUNTER — Encounter: Payer: Self-pay | Admitting: Oncology

## 2013-11-19 ENCOUNTER — Ambulatory Visit (HOSPITAL_BASED_OUTPATIENT_CLINIC_OR_DEPARTMENT_OTHER): Payer: Medicare Other | Admitting: Oncology

## 2013-11-19 ENCOUNTER — Telehealth: Payer: Self-pay | Admitting: *Deleted

## 2013-11-19 VITALS — BP 146/72 | HR 76 | Temp 98.1°F | Resp 18 | Ht 67.0 in | Wt 206.5 lb

## 2013-11-19 DIAGNOSIS — C779 Secondary and unspecified malignant neoplasm of lymph node, unspecified: Secondary | ICD-10-CM

## 2013-11-19 DIAGNOSIS — C50919 Malignant neoplasm of unspecified site of unspecified female breast: Secondary | ICD-10-CM

## 2013-11-19 DIAGNOSIS — K7689 Other specified diseases of liver: Secondary | ICD-10-CM

## 2013-11-19 DIAGNOSIS — C7989 Secondary malignant neoplasm of other specified sites: Secondary | ICD-10-CM

## 2013-11-19 DIAGNOSIS — C549 Malignant neoplasm of corpus uteri, unspecified: Secondary | ICD-10-CM

## 2013-11-19 DIAGNOSIS — E669 Obesity, unspecified: Secondary | ICD-10-CM

## 2013-11-19 DIAGNOSIS — E876 Hypokalemia: Secondary | ICD-10-CM

## 2013-11-19 DIAGNOSIS — G620 Drug-induced polyneuropathy: Secondary | ICD-10-CM

## 2013-11-19 DIAGNOSIS — C541 Malignant neoplasm of endometrium: Secondary | ICD-10-CM

## 2013-11-19 LAB — CBC WITH DIFFERENTIAL/PLATELET
BASO%: 0.5 % (ref 0.0–2.0)
Basophils Absolute: 0 10*3/uL (ref 0.0–0.1)
EOS ABS: 0.2 10*3/uL (ref 0.0–0.5)
EOS%: 2.6 % (ref 0.0–7.0)
HCT: 38.9 % (ref 34.8–46.6)
HGB: 12.9 g/dL (ref 11.6–15.9)
LYMPH%: 27.2 % (ref 14.0–49.7)
MCH: 32.2 pg (ref 25.1–34.0)
MCHC: 33.1 g/dL (ref 31.5–36.0)
MCV: 97.1 fL (ref 79.5–101.0)
MONO#: 0.8 10*3/uL (ref 0.1–0.9)
MONO%: 11.2 % (ref 0.0–14.0)
NEUT%: 58.5 % (ref 38.4–76.8)
NEUTROS ABS: 4.3 10*3/uL (ref 1.5–6.5)
PLATELETS: 208 10*3/uL (ref 145–400)
RBC: 4.01 10*6/uL (ref 3.70–5.45)
RDW: 14.9 % — AB (ref 11.2–14.5)
WBC: 7.3 10*3/uL (ref 3.9–10.3)
lymph#: 2 10*3/uL (ref 0.9–3.3)

## 2013-11-19 LAB — COMPREHENSIVE METABOLIC PANEL (CC13)
ALK PHOS: 65 U/L (ref 40–150)
ALT: 33 U/L (ref 0–55)
AST: 26 U/L (ref 5–34)
Albumin: 3.6 g/dL (ref 3.5–5.0)
Anion Gap: 10 mEq/L (ref 3–11)
BILIRUBIN TOTAL: 0.68 mg/dL (ref 0.20–1.20)
BUN: 10.9 mg/dL (ref 7.0–26.0)
CO2: 31 mEq/L — ABNORMAL HIGH (ref 22–29)
CREATININE: 0.8 mg/dL (ref 0.6–1.1)
Calcium: 10.1 mg/dL (ref 8.4–10.4)
Chloride: 100 mEq/L (ref 98–109)
GLUCOSE: 83 mg/dL (ref 70–140)
Potassium: 3.6 mEq/L (ref 3.5–5.1)
Sodium: 140 mEq/L (ref 136–145)
Total Protein: 7.4 g/dL (ref 6.4–8.3)

## 2013-11-19 NOTE — Progress Notes (Signed)
OFFICE PROGRESS NOTE   11/19/2013   Physicians:Brewster, Canary Brim, Lorri Frederick, Louie Casa ([facility])   INTERVAL HISTORY:  Patient is seen, together with husband, for discussion of restaging CTs done following 6 cycles of carboplatin and taxol for recurrent endometrial cancer. Chemotherapy was given from 06-26-13 thru 10-18-13. Prior to visit I have been in communication with Dr Skeet Latch, who has also reviewed the CTs, recommendation for additional chemotherapy.  Patient has felt gradually better overall since break off of chemotherapy, with better energy, no peripheral neuropathy in hands and left knee/leg better, bowels moving well. She had no recent nausea until a few hours after CT contrast, vomited, antiemetic then helpful. We have discussed water based oral contrast at radiology for subsequent CTs.  She does not have PAC; peripheral veins have been adequate for treatment. She had flu vaccine.   ONCOLOGIC HISTORY Oncology History   IA FIGO grade I endometrial carcinoma diagnosed 09-2010, treated with [place] BSO pelvic nodes 11-24-2010 . Recurrent in pelvis 04-2013 confirmed by biopsy     Endometrial cancer   11/02/2010 Initial Diagnosis Endometrial cancer  History goes back to 07-2010 when she presented with vaginal spotting. Endometrial biopsy by Dr Clovia Cuff 10-21-10 revealed endometroid carcinoma grade 1 focally involving polyp. She had robotic assisted total laparoscopic hysterectomy BSO and 20 pelvic node evaluation by Dr Skeet Latch on 11-24-2010. Pathology [phone]) had endometrial adenocarcinoma Grade I with focal squamous differentiation, superficial myometrial invasion ( 0.2 cm where myometrium 1.4 cm); benign cervix, tubes and ovaries; no LVSI. With no high risk features, she was followed with observation only, including exams by gyn oncology every 6 months.  In early March 2015 she had acute onset of lower abdominal pain, then fever and nausea over next several days.  She was treated by PCP for presumed diverticulitis with cipro and flagyl, initially with improvement then symptoms recurrent in late March. She had CT AP 05-23-13 showed a 4.1x4.1 cm mass left anterior upper pelvis, and CT biopsy 05-31-13 of mesenteric mass at level of umbilicus showed metastatic adenocarcinoma consistent with endometrioid carcinoma. Patient saw Dr Skeet [facility]atch at [facility] on 06-08-13, with exam remarkable for palpable right pelvic mass and recommendation for 3 cycles of taxane / platin chemotherapy, then repeat imaging; may also consider radiation after chemotherapy. The initial abdominal pain resolved entirely. Cycle 1 taxol carboplatin was given 06-26-2013; she was neutropenic with ANC 0 on day 14. Repeat CT AP after cycle 3 had partial response of dominant ventral peritoneal mass, right pelvic sidewall mass and implant along sigmoid colon. She completed additional 3 cycles of carboplatin and taxol on 10-18-13,  for total 6 cycles with neulasta support. CT AP 11-15-13 had some improvement including one area of intraperitoneal disease, and no new areas of involvement. Additional systemic therapy recommended.   Review of systems as above, also:  no fever or symptoms of infection. No SOB or other respiratory symptoms. No abdominal or pelvic discomfort. No bleeding. No LE swelling. Remainder of 10 point Review of Systems negative.  Objective:  Vital signs in last 24 hours:  BP 146/72  Pulse 76  Temp(Src) 98.1 F (36.7 C) (Oral)  Resp 18  Ht 5\' 7"  (1.702 m)  Wt 206 lb 8 oz (93.668 kg)  BMI 32.33 kg/m2 Weight is down 3.5 lbs Alert, oriented and appropriate. Ambulatory without difficulty.  Alopecia  HEENT:PERRL, sclerae not icteric. Oral mucosa moist without lesions, posterior pharynx clear.  Neck supple. No JVD.  Lymphatics:no cervical,supraclavicular, axillary or inguinal adenopathy Resp: clear  to auscultation bilaterally and normal percussion bilaterally Cardio: regular rate and rhythm. No  gallop. GI: abdomen soft, nontender, not distended, no mass or organomegaly. Normally active bowel sounds. Surgical incision not remarkable. Musculoskeletal/ Extremities: without pitting edema, cords, tenderness Neuro: no peripheral neuropathy. Otherwise nonfocal. PSYCH normal mood and affect Skin without rash, ecchymosis, petechiae   Lab Results:  Results for orders placed in visit on 11/19/13  CBC WITH DIFFERENTIAL      Result Value Ref Range   WBC 7.3  3.9 - 10.3 10e3/uL   NEUT# 4.3  1.5 - 6.5 10e3/uL   HGB 12.9  11.6 - 15.9 g/dL   HCT 38.9  34.8 - 46.6 %   Platelets 208  145 - 400 10e3/uL   MCV 97.1  79.5 - 101.0 fL   MCH 32.2  25.1 - 34.0 pg   MCHC 33.1  31.5 - 36.0 g/dL   RBC 4.01  3.70 - 5.45 10e6/uL   RDW 14.9 (*) 11.2 - 14.5 %   lymph# 2.0  0.9 - 3.3 10e3/uL   MONO# 0.8  0.1 - 0.9 10e3/uL   Eosinophils Absolute 0.2  0.0 - 0.5 10e3/uL   Basophils Absolute 0.0  0.0 - 0.1 10e3/uL   NEUT% 58.5  38.4 - 76.8 %   LYMPH% 27.2  14.0 - 49.7 %   MONO% 11.2  0.0 - 14.0 %   EOS% 2.6  0.0 - 7.0 %   BASO% 0.5  0.0 - 2.0 %  COMPREHENSIVE METABOLIC PANEL (ME26)      Result Value Ref Range   Sodium 140  136 - 145 mEq/L   Potassium 3.6  3.5 - 5.1 mEq/L   Chloride 100  98 - 109 mEq/L   CO2 31 (*) 22 - 29 mEq/L   Glucose 83  70 - 140 mg/dl   BUN 10.9  7.0 - 26.0 mg/dL   Creatinine 0.8  0.6 - 1.1 mg/dL   Total Bilirubin 0.68  0.20 - 1.20 mg/dL   Alkaline Phosphatase 65  40 - 150 U/L   AST 26  5 - 34 U/L   ALT 33  0 - 55 U/L   Total Protein 7.4  6.4 - 8.3 g/dL   Albumin 3.6  3.5 - 5.0 g/dL   Calcium 10.1  8.4 - 10.4 mg/dL   Anion Gap 10  3 - 11 mEq/L     Studies/Results:  CT ABDOMEN AND PELVIS WITH CONTRAST  11-15-2013 COMPARISON: 08/28/2013  FINDINGS:  Lower chest: The lung bases are clear. There is no pleural or  pericardial effusion.  Hepatobiliary: No suspicious liver abnormality identified. The  gallbladder appears normal. No biliary dilatation.  Spleen: The spleen is  unremarkable.  Pancreas: Normal appearance of the pancreas.  Stomach/Bowel: The stomach appears normal. The small bowel loops  have a normal course and caliber. There is no evidence for bowel  obstruction. The appendix and terminal ileum both appear normal.  Scattered colonic diverticula noted. No acute inflammation.  Adrenals/urinary tract: The adrenal glands both appear normal.  Right-sided parapelvic cysts noted. Left kidney appears normal. The  urinary bladder is unremarkable.  Vascular/Lymphatic: There is mild calcified atherosclerotic disease  involving the normal caliber abdominal aorta. No retroperitoneal  adenopathy. No enlarged mesenteric lymph nodes. No pelvic or  inguinal adenopathy.  Reproductive: Previous hysterectomy.  Musculoskeletal: Review of the visualized osseous structures is  negative for aggressive lytic or sclerotic bone lesions.  Other: There is no free fluid or fluid collections within the  abdomen or pelvis. Peritoneal metastasis within the ventral portion  of the lower abdomen measures 3.6 x 2.0 cm, image 59/series 2.  Previously 3.7 x 2.3 cm. Right pelvic sidewall nodule measure 1.1 x  0.8 cm, image 72/ series 2. Previously 1.3 x 1.0 cm. Serosal implant  along the sigmoid colon is no longer measurable.  IMPRESSION:  1. Interval response to therapy. The dominant ventral peritoneal  mass has decreased in size from previous exam.  2. Small right pelvic sidewall nodule is also decreased in size over  the interval. The serosal implant along the sigmoid colon is no  longer measurable.   Medications: I have reviewed the patient's current medications. Continue potassium rich foods, stop prescription K, continue "natural K supplement". Premed decadron for weekly taxol will be decadron 20 mg 12 hrs prior.  DISCUSSION: CT findings reviewed as above, some improvement including along sigmoid colon and no new areas of involvement. Explained rationale for additional chemo  rather than RT. Discussed progressive difficulty tolerating q 3 week taxol and carboplatin towards end of those 6 cycles, tho fortunately peripheral neuropathy has improved quite a bit off of treatment. I have suggested dose dense carbo taxol, as the weekly taxol may be easier to tolerate and may give more benefit than q3 week taxol at this point. I have mentioned availablilty of other agents when she is no longer sensitive to carbo/taxol, and mentioned avastin. Dr Skeet Latch kindly offered to see her on 11-21-13 to discuss situation from her perspective, which patient would like. Patient and husband understand that we can certainly change chemo regimen if Dr Skeet Latch has preference otherwise, but have tentatively scheduled for weekly dose dense carbo taxol starting 11-26-13 (prefers Mondays).  We have discussed carbo skin tests. She still prefers peripheral IVs. I will see her back 12-03-13 and am glad to discuss with Dr Skeet Latch at her visit if needed.  Assessment/Plan:  1.Endometrial carcinoma initially IA grade 1 at surgery 11-2010, then multifocal recurrence in pelvis found April 2015. Six cycles q 3 week carbo taxol given 5-5 thru 10-18-13. CT with some improvement and no new disease. Resume chemo 11-26-13 with dose dense carbo taxol, or other regimen if Dr Skeet Latch prefers.  2.taxol related peripheral neuropathy: some better, follow on low dose weekly taxol.  3.HTN  4.fatty liver and obesity  5.diverticulosis: not symptomatic now.  6.hypokalemia improved: written and oral list of high potassium foods given. 7.obesity: BMI 32 8.poor tolerance oral contrast for CTs: may do better with water based po contrast at radiology for next scans 9.flu vaccine done   All questions answered to their satisfaction. Prior auth requested. Consent done.    LIVESAY,LENNIS P, MD   11/19/2013, 4:45 PM

## 2013-11-19 NOTE — Telephone Encounter (Signed)
Per staff message and POF I have scheduled appts. Advised scheduler of appts. JMW  

## 2013-11-19 NOTE — Telephone Encounter (Signed)
per pfo top sch pt appt-gave pt copy of sch-sent MW emailt o [facility] trmts

## 2013-11-20 ENCOUNTER — Other Ambulatory Visit: Payer: Self-pay | Admitting: Oncology

## 2013-11-20 ENCOUNTER — Telehealth: Payer: Self-pay | Admitting: Gynecologic Oncology

## 2013-11-20 DIAGNOSIS — C549 Malignant neoplasm of corpus uteri, unspecified: Secondary | ICD-10-CM

## 2013-11-20 NOTE — Telephone Encounter (Signed)
Office Visit:  GYN ONCOLOGY  CC:  Recurrent endometrial cancer  Assessment/Plan:  Erika Strong is a 68 y.o. stage IA grade 1 endometrial adenocarcinoma staged 11/2010 now with multifocal recurrence identified 04/2013.  She received 6 cycles taxol/carboplatin and CT 10/2013 with significant improvement but persistent disease.  The largest mass in intraperitoneal.  Continue for an additional 3 cycles of carbo/taxol.    Would not recommend radiotherapy because of the intraperitoneal disease.   HPI: ,Erika Strong is a 68 y.o. gravida 1, para 1 was in her usual state of health until early June 2012, and she noted light vaginal spotting, this spotting persisted and she sought the advice of Dr. Madilyn Fireman, who directly facilitated referral to Dr. Clovia Cuff for evaluation. A pelvic ultrasound was obtained and was notable for uterus measuring 6.4 x 2.7 x 4.8 cm. A fibroid was noted in the anterior uterine segment and left fundus. The endometrium was noted to measure 13.4 mm in thickness. Otherwise, the findings were unremarkable. An endometrial biopsy was obtained on October 21, 2010, and demonstrated endometrioid type grade 1 adenocarcinoma focally involving a polyp.   On 11/24/2010 she underwent [place] BSO RPLND LPLNS Pathology endometrial adenocarcinoma with focal superficial myometrial invasion. A invasion was 0.2 cm where the myometrium is 1.4 cm there was no lymph vascular space invasion 0 of 20 lymph nodes were involved with tumor.  Interval History: Patient reported a recent episode of lower abdominal pain c/w diverticulitis in early 2015. She was Rx with a 10 day course of antibiotics with resolution of the abdominal pain. Imaging was obtained  05/22/2013 which demonstrated In the pelvis, there is a mass in the left anterior upper pelvis measuring 4.1 x 4.1 cm which does not connect to bowel. Uterus is absent. There is a lymph node lateral to the rectum on the right measuring 2.1 x 1.7 cm, consistent with  adenopathy. The urinary bladder is midline with normal wall thickness. Uterus is absent  There is wall thickening throughout much of the sigmoid colon with mucosal irregularity consistent with diverticulitis. There is no abscess. There is no bowel obstruction. No free air or portal venous air.  There is no ascites or abscess in the abdomen or pelvis. No other adenopathy is seen beyond that described lateral to the rectum on the right.  Bx mesenteric mass 05/31/2013 Mesentery, ABD mesenteric mass - METASTATIC ADENOCARCINOMA, CONSISTENT WITH ENDOMETRIOID ADENOCARCINOMA.  [name] has received 6 cycles taxol/carboplatin as of 09/2013.   CT 11/15/2013 IMPRESSION:  1. Interval response to therapy. The dominant ventral peritoneal  mass has decreased in size from previous exam.  2. Small right pelvic sidewall nodule is also decreased in size over  the interval. The serosal implant along the sigmoid colon is no  longer measurable.   Review of Systems  Constitutional  Feels well  Cardiovascular  No chest pain, shortness of breath, or edema  Pulmonary  No cough or wheeze.  Gastro Intestinal  No nausea, vomitting, or diarrhoea. No bright red blood per rectum, no abdominal pain Genito Urinary  No frequency, urgency, dysuria, no vaginal bleeding. Musculo Skeletal  No myalgia, arthralgia, joint swelling or pain  Neurologic  No weakness, numbness, change in gait,    Past Medical Hx:  Past Medical History  Diagnosis Date  . Wears glasses   . Hypertension   . Diverticulitis 2010  . Hyperlipidemia     2007  . Cancer   . Endometrial cancer 11/02/2010  . Malignant neoplasm of corpus  uteri, except isthmus 01/21/2011   Past Surgical History  Procedure Laterality Date  . Gonadectomy and hysterectomy  11/24/2010    Endometrial cancer, performed by Dr. Janie Morning  . Mole removal  Nov 2012  . Abdominal hysterectomy  11/24/2010    RTLH, BSO, RPLND, LPLNS   History   Social History  Narrative   Walks daily for exercise.       Vitals: BP 152/74  Pulse 67  Temp(Src) 97.7 F (36.5 C) (Oral)  Resp 18  Ht 5\' 7"  (1.702 m)  Wt 206 lb 11.2 oz (93.759 kg)  BMI 32.37 kg/m2  Physical Exam:  Neck  Supple without any enlargements.  Cardiovascular  Pulse normal rate, regularity and rhythm.  Lungs  Clear to auscultation bilaterally Psychiatry  Alert and oriented to person, appropriate affect and speech. Abdomen  Normoactive bowel sounds, abdomen soft, non-tender and obese. Surgical sites intact without evidence of hernia. No palpable abdominal wall masses. Genito Urinary  Vulva/vagina: grade 1 cystocele, grade 1 rectocele  Normal external female genitalia. Right 2cm pelvic sidewall mass palpated. No vaginal lesions Rectal:  External hemorrhoids, good tone no masses Extremities  1-2+ lower extremity edema.

## 2013-11-21 ENCOUNTER — Ambulatory Visit: Payer: Medicare Other | Attending: Gynecologic Oncology | Admitting: Gynecologic Oncology

## 2013-11-21 ENCOUNTER — Encounter: Payer: Self-pay | Admitting: Gynecologic Oncology

## 2013-11-21 VITALS — BP 148/72 | HR 72 | Temp 98.7°F | Resp 18 | Ht 67.0 in | Wt 207.2 lb

## 2013-11-21 DIAGNOSIS — C549 Malignant neoplasm of corpus uteri, unspecified: Secondary | ICD-10-CM | POA: Diagnosis present

## 2013-11-21 DIAGNOSIS — Z90710 Acquired absence of both cervix and uterus: Secondary | ICD-10-CM | POA: Diagnosis not present

## 2013-11-21 DIAGNOSIS — Z9079 Acquired absence of other genital organ(s): Secondary | ICD-10-CM | POA: Insufficient documentation

## 2013-11-21 DIAGNOSIS — C541 Malignant neoplasm of endometrium: Secondary | ICD-10-CM

## 2013-11-21 NOTE — Progress Notes (Signed)
Office Visit:  GYN ONCOLOGY  CC:  Recurrent endometrial cancer  Assessment/Plan:  [name] is a 68 y.o. stage IA grade 1 endometrial adenocarcinoma staged 11/2010 now with multifocal recurrence identified 04/2013.  She has received 6 cycles taxol/carboplatin and CT 10/2013 with significant improvement but persistent disease is noted on imaging.  The largest mass is intraperitoneal.  Continue for an additional 3 cycles of carbo/taxol dose dense followed by CT assessment  Would not recommend radiotherapy because of the intraperitoneal disease. Follow-up after cycle 9 taxol/carbo Discussion with the patient and her husband about the options in addition to review of the imaging lasted 25 minutes.   HPI: ,Erika Strong is a 68 y.o. gravida 1, para 1 was in her usual state of health until early June 2012, and she noted light vaginal spotting, this spotting persisted and she sought the advice of Dr. Madilyn Fireman, who directly facilitated referral to Dr. Clovia Cuff for evaluation. A pelvic ultrasound was obtained and was notable for uterus measuring 6.4 x 2.7 x 4.8 cm. A fibroid was noted in the anterior uterine segment and left fundus. The endometrium was noted to measure 13.4 mm in thickness. Otherwise, the findings were unremarkable. An endometrial biopsy was obtained on October 21, 2010, and demonstrated endometrioid type grade 1 adenocarcinoma focally involving a polyp.   On 11/24/2010 she underwent [place] [facility] BSO RPLND LPLNS Pathology endometrial adenocarcinoma with focal superficial myometrial invasion. A invasion was 0.2 cm where the myometrium is 1.4 cm there was no lymph vascular space invasion 0 of 20 lymph nodes were involved with tumor.  Interval History: Patient reported a recent episode of lower abdominal pain c/w diverticulitis in early 2015. She was Rx with a 10 day course of antibiotics with resolution of the abdominal pain. Imaging was obtained  05/22/2013 which demonstrated In the pelvis, there is a  mass in the left anterior upper pelvis measuring 4.1 x 4.1 cm which does not connect to bowel. Uterus is absent. There is a lymph node lateral to the rectum on the right measuring 2.1 x 1.7 cm, consistent with adenopathy. The urinary bladder is midline with normal wall thickness. Uterus is absent  There is wall thickening throughout much of the sigmoid colon with mucosal irregularity consistent with diverticulitis. There is no abscess. There is no bowel obstruction. No free air or portal venous air.  There is no ascites or abscess in the abdomen or pelvis. No other adenopathy is seen beyond that described lateral to the rectum on the right.  Bx mesenteric mass 05/31/2013 Mesentery, ABD mesenteric mass - METASTATIC ADENOCARCINOMA, CONSISTENT WITH ENDOMETRIOID ADENOCARCINOMA.  Erika Strong has received 6 cycles taxol/carboplatin as of 09/2013.   CT 11/15/2013 IMPRESSION:  1. Interval response to therapy. The dominant ventral peritoneal  mass has decreased in size from previous exam.  2. Small right pelvic sidewall nodule is also decreased in size over the interval. The serosal implant along the sigmoid colon is no longer measurable.   Review of Systems  Constitutional  Feels well  Cardiovascular  No chest pain, shortness of breath, or edema  Pulmonary  No cough or wheeze.  Gastro Intestinal  No nausea, vomitting, or diarrhoea. No bright red blood per rectum, no abdominal pain Genito Urinary  No frequency, urgency, dysuria, no vaginal bleeding. Musculo Skeletal  No myalgia, arthralgia, joint swelling or pain  Neurologic  No weakness, numbness, change in gait,    Past Medical Hx:  Past Medical History  Diagnosis Date  . Wears glasses   .  Hypertension   . Diverticulitis 2010  . Hyperlipidemia     2007  . Cancer   . Endometrial cancer 11/02/2010  . Malignant neoplasm of corpus uteri, except isthmus 01/21/2011   Past Surgical History  Procedure Laterality Date  . Gonadectomy and  hysterectomy  11/24/2010    Endometrial cancer, performed by Dr. Janie Morning  . Mole removal  Nov 2012  . Abdominal hysterectomy  11/24/2010    RTLH, BSO, RPLND, LPLNS   History   Social History Narrative   Walks daily for exercise.       Vitals: BP 148/72  Pulse 72  Temp(Src) 98.7 F (37.1 C) (Oral)  Resp 18  Ht 5\' 7"  (1.702 m)  Wt 207 lb 3.2 oz (93.985 kg)  BMI 32.44 kg/m2  Physical Exam:  WD female in NAD A

## 2013-11-21 NOTE — Patient Instructions (Signed)
Follow-up Dec 2015  Happy Thanksgiving   Thank you very much [name] for allowing me to provide care for you today.  I appreciate your confidence in choosing our Gynecologic Oncology team.  If you have any questions about your visit today please call our office and we will get back to you as soon as possible.  Please consider using the website Medlineplus.gov as an Geneticist, molecular.   Francetta Found. Mariselda Badalamenti MD., PhD Gynecologic Oncology

## 2013-11-22 ENCOUNTER — Telehealth: Payer: Self-pay

## 2013-11-22 DIAGNOSIS — C541 Malignant neoplasm of endometrium: Secondary | ICD-10-CM

## 2013-11-22 MED ORDER — DEXAMETHASONE 4 MG PO TABS: ORAL_TABLET | ORAL | Status: DC

## 2013-11-22 NOTE — Telephone Encounter (Signed)
Ms. Duignan needs her decadron premeds sent to  her pharmacy for the dose dense taxol/carbo treatment to begin on 11-26-13.

## 2013-11-26 ENCOUNTER — Ambulatory Visit (HOSPITAL_BASED_OUTPATIENT_CLINIC_OR_DEPARTMENT_OTHER): Payer: Medicare Other

## 2013-11-26 ENCOUNTER — Other Ambulatory Visit (HOSPITAL_BASED_OUTPATIENT_CLINIC_OR_DEPARTMENT_OTHER): Payer: Medicare Other

## 2013-11-26 ENCOUNTER — Other Ambulatory Visit: Payer: Medicare Other

## 2013-11-26 VITALS — BP 143/68 | HR 92 | Temp 98.3°F | Resp 18

## 2013-11-26 DIAGNOSIS — Z5111 Encounter for antineoplastic chemotherapy: Secondary | ICD-10-CM

## 2013-11-26 DIAGNOSIS — C549 Malignant neoplasm of corpus uteri, unspecified: Secondary | ICD-10-CM

## 2013-11-26 DIAGNOSIS — C541 Malignant neoplasm of endometrium: Secondary | ICD-10-CM

## 2013-11-26 LAB — CBC WITH DIFFERENTIAL/PLATELET
BASO%: 0.2 % (ref 0.0–2.0)
Basophils Absolute: 0 10*3/uL (ref 0.0–0.1)
EOS ABS: 0 10*3/uL (ref 0.0–0.5)
EOS%: 0 % (ref 0.0–7.0)
HEMATOCRIT: 40.2 % (ref 34.8–46.6)
HGB: 13.2 g/dL (ref 11.6–15.9)
LYMPH#: 0.8 10*3/uL — AB (ref 0.9–3.3)
LYMPH%: 11.7 % — ABNORMAL LOW (ref 14.0–49.7)
MCH: 31.6 pg (ref 25.1–34.0)
MCHC: 32.8 g/dL (ref 31.5–36.0)
MCV: 96.2 fL (ref 79.5–101.0)
MONO#: 0 10*3/uL — ABNORMAL LOW (ref 0.1–0.9)
MONO%: 0.6 % (ref 0.0–14.0)
NEUT%: 87.5 % — ABNORMAL HIGH (ref 38.4–76.8)
NEUTROS ABS: 5.8 10*3/uL (ref 1.5–6.5)
Platelets: 231 10*3/uL (ref 145–400)
RBC: 4.18 10*6/uL (ref 3.70–5.45)
RDW: 14.2 % (ref 11.2–14.5)
WBC: 6.7 10*3/uL (ref 3.9–10.3)

## 2013-11-26 LAB — COMPREHENSIVE METABOLIC PANEL (CC13)
ALT: 23 U/L (ref 0–55)
ANION GAP: 17 meq/L — AB (ref 3–11)
AST: 17 U/L (ref 5–34)
Albumin: 3.7 g/dL (ref 3.5–5.0)
Alkaline Phosphatase: 72 U/L (ref 40–150)
BUN: 12.1 mg/dL (ref 7.0–26.0)
CHLORIDE: 98 meq/L (ref 98–109)
CO2: 20 mEq/L — ABNORMAL LOW (ref 22–29)
Calcium: 10.1 mg/dL (ref 8.4–10.4)
Creatinine: 0.8 mg/dL (ref 0.6–1.1)
GLUCOSE: 215 mg/dL — AB (ref 70–140)
Potassium: 3.2 mEq/L — ABNORMAL LOW (ref 3.5–5.1)
SODIUM: 136 meq/L (ref 136–145)
TOTAL PROTEIN: 7.6 g/dL (ref 6.4–8.3)
Total Bilirubin: 0.42 mg/dL (ref 0.20–1.20)

## 2013-11-26 MED ORDER — PACLITAXEL CHEMO INJECTION 300 MG/50ML
80.0000 mg/m2 | Freq: Once | INTRAVENOUS | Status: AC
  Administered 2013-11-26: 168 mg via INTRAVENOUS
  Filled 2013-11-26: qty 28

## 2013-11-26 MED ORDER — SODIUM CHLORIDE 0.9 % IV SOLN
534.5000 mg | Freq: Once | INTRAVENOUS | Status: AC
  Administered 2013-11-26: 530 mg via INTRAVENOUS
  Filled 2013-11-26: qty 53

## 2013-11-26 MED ORDER — FAMOTIDINE IN NACL 20-0.9 MG/50ML-% IV SOLN
20.0000 mg | Freq: Once | INTRAVENOUS | Status: AC
  Administered 2013-11-26: 20 mg via INTRAVENOUS

## 2013-11-26 MED ORDER — SODIUM CHLORIDE 0.9 % IV SOLN
Freq: Once | INTRAVENOUS | Status: AC
  Administered 2013-11-26: 14:00:00 via INTRAVENOUS

## 2013-11-26 MED ORDER — CARBOPLATIN CHEMO INTRADERMAL TEST DOSE 100MCG/0.02ML
100.0000 ug | Freq: Once | INTRADERMAL | Status: AC
  Administered 2013-11-26: 100 ug via INTRADERMAL
  Filled 2013-11-26: qty 0.01

## 2013-11-26 MED ORDER — ONDANSETRON 16 MG/50ML IVPB (CHCC)
INTRAVENOUS | Status: AC
  Filled 2013-11-26: qty 16

## 2013-11-26 MED ORDER — DIPHENHYDRAMINE HCL 50 MG/ML IJ SOLN
50.0000 mg | Freq: Once | INTRAMUSCULAR | Status: AC
  Administered 2013-11-26: 50 mg via INTRAVENOUS

## 2013-11-26 MED ORDER — DEXAMETHASONE SODIUM PHOSPHATE 20 MG/5ML IJ SOLN
20.0000 mg | Freq: Once | INTRAMUSCULAR | Status: AC
  Administered 2013-11-26: 20 mg via INTRAVENOUS

## 2013-11-26 MED ORDER — FAMOTIDINE IN NACL 20-0.9 MG/50ML-% IV SOLN
INTRAVENOUS | Status: AC
  Filled 2013-11-26: qty 50

## 2013-11-26 MED ORDER — DIPHENHYDRAMINE HCL 50 MG/ML IJ SOLN
INTRAMUSCULAR | Status: AC
  Filled 2013-11-26: qty 1

## 2013-11-26 MED ORDER — DEXAMETHASONE SODIUM PHOSPHATE 20 MG/5ML IJ SOLN
INTRAMUSCULAR | Status: AC
  Filled 2013-11-26: qty 5

## 2013-11-26 MED ORDER — ONDANSETRON 16 MG/50ML IVPB (CHCC)
16.0000 mg | Freq: Once | INTRAVENOUS | Status: AC
  Administered 2013-11-26: 16 mg via INTRAVENOUS

## 2013-11-26 NOTE — Patient Instructions (Signed)
[facility] Discharge Instructions for Patients Receiving Chemotherapy  Today you received the following chemotherapy agents: Taxol and Carboplatin.  To help prevent nausea and vomiting after your treatment, we encourage you to take your nausea medication as prescribed.   If you develop nausea and vomiting that is not controlled by your nausea medication, call the clinic.   BELOW ARE SYMPTOMS THAT SHOULD BE REPORTED IMMEDIATELY:  *FEVER GREATER THAN 100.5 F  *CHILLS WITH OR WITHOUT FEVER  NAUSEA AND VOMITING THAT IS NOT CONTROLLED WITH YOUR NAUSEA MEDICATION  *UNUSUAL SHORTNESS OF BREATH  *UNUSUAL BRUISING OR BLEEDING  TENDERNESS IN MOUTH AND THROAT WITH OR WITHOUT PRESENCE OF ULCERS  *URINARY PROBLEMS  *BOWEL PROBLEMS  UNUSUAL RASH Items with * indicate a potential emergency and should be followed up as soon as possible.  Feel free to call the clinic you have any questions or concerns. The clinic phone number is [phone].    

## 2013-11-27 ENCOUNTER — Other Ambulatory Visit: Payer: Self-pay | Admitting: Family Medicine

## 2013-11-27 ENCOUNTER — Telehealth: Payer: Self-pay | Admitting: *Deleted

## 2013-11-27 DIAGNOSIS — C541 Malignant neoplasm of endometrium: Secondary | ICD-10-CM

## 2013-11-27 MED ORDER — POTASSIUM CHLORIDE ER 10 MEQ PO TBCR: 20.0000 meq | EXTENDED_RELEASE_TABLET | Freq: Every day | ORAL | Status: DC

## 2013-11-27 NOTE — Telephone Encounter (Signed)
Message copied by Christa See on Tue Nov 27, 2013 11:44 AM ------      Message from: Gordy Levan      Created: Tue Nov 27, 2013 10:38 AM       Labs seen and need follow up: potassium still low yesterday at 3.2. I would recommend she resume the K prescription, total prescription 20 mEq daily. This may have been 10 mEq capsules (?)  QS 30 days 2 RF   thanks ------

## 2013-11-27 NOTE — Telephone Encounter (Signed)
Called patient as noted below by Dr. Marko Plume. Pt states that when she took "the yellow potassium pills last time they would come out whole when I went to the bathroom." Told patient I would call pharmacy and check on this and call her back. Per her pharmacy, they no longer get K-dur in stock (only comes generically now). Pharmacy states pills cannot be crushed but okay to put in applesauce and let them "melt" and then take them. Called pt back and left VM letting her know this and that prescription will be ready at her pharmacy.

## 2013-12-03 ENCOUNTER — Encounter: Payer: Self-pay | Admitting: Oncology

## 2013-12-03 ENCOUNTER — Telehealth: Payer: Self-pay | Admitting: *Deleted

## 2013-12-03 ENCOUNTER — Ambulatory Visit (HOSPITAL_BASED_OUTPATIENT_CLINIC_OR_DEPARTMENT_OTHER): Payer: Medicare Other

## 2013-12-03 ENCOUNTER — Other Ambulatory Visit (HOSPITAL_BASED_OUTPATIENT_CLINIC_OR_DEPARTMENT_OTHER): Payer: Medicare Other

## 2013-12-03 ENCOUNTER — Ambulatory Visit (HOSPITAL_BASED_OUTPATIENT_CLINIC_OR_DEPARTMENT_OTHER): Payer: Medicare Other | Admitting: Oncology

## 2013-12-03 ENCOUNTER — Other Ambulatory Visit: Payer: Self-pay | Admitting: Oncology

## 2013-12-03 VITALS — BP 141/75 | HR 90 | Temp 98.5°F | Resp 20 | Ht 67.0 in | Wt 206.8 lb

## 2013-12-03 DIAGNOSIS — Z5111 Encounter for antineoplastic chemotherapy: Secondary | ICD-10-CM

## 2013-12-03 DIAGNOSIS — G620 Drug-induced polyneuropathy: Secondary | ICD-10-CM

## 2013-12-03 DIAGNOSIS — K760 Fatty (change of) liver, not elsewhere classified: Secondary | ICD-10-CM

## 2013-12-03 DIAGNOSIS — C541 Malignant neoplasm of endometrium: Secondary | ICD-10-CM

## 2013-12-03 DIAGNOSIS — E669 Obesity, unspecified: Secondary | ICD-10-CM

## 2013-12-03 DIAGNOSIS — C7989 Secondary malignant neoplasm of other specified sites: Secondary | ICD-10-CM

## 2013-12-03 DIAGNOSIS — C549 Malignant neoplasm of corpus uteri, unspecified: Secondary | ICD-10-CM

## 2013-12-03 LAB — COMPREHENSIVE METABOLIC PANEL (CC13)
ALK PHOS: 69 U/L (ref 40–150)
ALT: 24 U/L (ref 0–55)
ANION GAP: 15 meq/L — AB (ref 3–11)
AST: 22 U/L (ref 5–34)
Albumin: 3.8 g/dL (ref 3.5–5.0)
BILIRUBIN TOTAL: 0.46 mg/dL (ref 0.20–1.20)
BUN: 13.7 mg/dL (ref 7.0–26.0)
CO2: 21 meq/L — AB (ref 22–29)
CREATININE: 0.8 mg/dL (ref 0.6–1.1)
Calcium: 10 mg/dL (ref 8.4–10.4)
Chloride: 98 mEq/L (ref 98–109)
Glucose: 176 mg/dl — ABNORMAL HIGH (ref 70–140)
Potassium: 3.5 mEq/L (ref 3.5–5.1)
Sodium: 134 mEq/L — ABNORMAL LOW (ref 136–145)
Total Protein: 7.4 g/dL (ref 6.4–8.3)

## 2013-12-03 LAB — CBC WITH DIFFERENTIAL/PLATELET
BASO%: 0.3 % (ref 0.0–2.0)
Basophils Absolute: 0 10*3/uL (ref 0.0–0.1)
EOS%: 0 % (ref 0.0–7.0)
Eosinophils Absolute: 0 10*3/uL (ref 0.0–0.5)
HEMATOCRIT: 39.4 % (ref 34.8–46.6)
HGB: 13.1 g/dL (ref 11.6–15.9)
LYMPH%: 9.6 % — AB (ref 14.0–49.7)
MCH: 31.2 pg (ref 25.1–34.0)
MCHC: 33.2 g/dL (ref 31.5–36.0)
MCV: 94.1 fL (ref 79.5–101.0)
MONO#: 0 10*3/uL — ABNORMAL LOW (ref 0.1–0.9)
MONO%: 0.4 % (ref 0.0–14.0)
NEUT#: 5.2 10*3/uL (ref 1.5–6.5)
NEUT%: 89.7 % — AB (ref 38.4–76.8)
PLATELETS: 185 10*3/uL (ref 145–400)
RBC: 4.19 10*6/uL (ref 3.70–5.45)
RDW: 14.3 % (ref 11.2–14.5)
WBC: 5.8 10*3/uL (ref 3.9–10.3)
lymph#: 0.6 10*3/uL — ABNORMAL LOW (ref 0.9–3.3)

## 2013-12-03 MED ORDER — ONDANSETRON 8 MG/50ML IVPB (CHCC)
8.0000 mg | Freq: Once | INTRAVENOUS | Status: AC
  Administered 2013-12-03: 8 mg via INTRAVENOUS

## 2013-12-03 MED ORDER — SODIUM CHLORIDE 0.9 % IV SOLN
Freq: Once | INTRAVENOUS | Status: AC
  Administered 2013-12-03: 13:00:00 via INTRAVENOUS

## 2013-12-03 MED ORDER — FAMOTIDINE IN NACL 20-0.9 MG/50ML-% IV SOLN
20.0000 mg | Freq: Once | INTRAVENOUS | Status: AC
  Administered 2013-12-03: 20 mg via INTRAVENOUS

## 2013-12-03 MED ORDER — ONDANSETRON 8 MG/NS 50 ML IVPB
INTRAVENOUS | Status: AC
  Filled 2013-12-03: qty 8

## 2013-12-03 MED ORDER — PACLITAXEL CHEMO INJECTION 300 MG/50ML
80.0000 mg/m2 | Freq: Once | INTRAVENOUS | Status: AC
  Administered 2013-12-03: 168 mg via INTRAVENOUS
  Filled 2013-12-03: qty 28

## 2013-12-03 MED ORDER — FAMOTIDINE IN NACL 20-0.9 MG/50ML-% IV SOLN
INTRAVENOUS | Status: AC
  Filled 2013-12-03: qty 50

## 2013-12-03 MED ORDER — DEXAMETHASONE SODIUM PHOSPHATE 20 MG/5ML IJ SOLN
20.0000 mg | Freq: Once | INTRAMUSCULAR | Status: AC
  Administered 2013-12-03: 20 mg via INTRAVENOUS

## 2013-12-03 MED ORDER — DIPHENHYDRAMINE HCL 50 MG/ML IJ SOLN
50.0000 mg | Freq: Once | INTRAMUSCULAR | Status: AC
  Administered 2013-12-03: 50 mg via INTRAVENOUS

## 2013-12-03 MED ORDER — DIPHENHYDRAMINE HCL 50 MG/ML IJ SOLN
INTRAMUSCULAR | Status: AC
  Filled 2013-12-03: qty 1

## 2013-12-03 MED ORDER — DEXAMETHASONE SODIUM PHOSPHATE 20 MG/5ML IJ SOLN
INTRAMUSCULAR | Status: AC
  Filled 2013-12-03: qty 5

## 2013-12-03 NOTE — Telephone Encounter (Signed)
Per chemo RN and POF I have scheduled appts 

## 2013-12-03 NOTE — Progress Notes (Signed)
OFFICE PROGRESS NOTE   12/03/2013   Physicians:Brewster, Canary Brim, Lorri Frederick, Louie Casa ([facility])   INTERVAL HISTORY:  Patient is seen, together with husband, continuing chemotherapy for recurrent endometrial carcinoma. Chemo with taxol and carboplatin is now being given in dose dense fashion, day 1 cycle 1 on 11-26-13, and toletated better than previous 6 cycles of full dose carbo taxol given from 5-5-`5 thru 10-18-13. She had carbo skin test with most recent treatment. Plan is additional total of 3 cycles of chemo, as she had significant improvement but still residual disease including intraperitoneal involvement by CT AP 11-15-13. She needed gCSF support (neulasta) with full dose carbo taxol previously. She and husband met with Dr Skeet Latch on 11-21-13, that discussion also helpful to them.   She has some intermittent numbness in toes, no worse since low dose taxol last week; she has no neuropathy in fingers. 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 SKin test was negative day of Rx, however later in week she had tiny, nonpuritic erythema at that site. Bowels are moving well. She did not have any significant aches. She is not fatigued today.  Premedication for taxol was decadron 20 mg 12 hrs prior.  She does not have PAC. She has had flu vaccine.   ONCOLOGIC HISTORY  Endometrial cancer    11/02/2010  Initial Diagnosis  Endometrial cancer   History goes back to 07-2010 when she presented with vaginal spotting. Endometrial biopsy by Dr Clovia Cuff 10-21-10 revealed endometroid carcinoma grade 1 focally involving polyp. She had robotic assisted total laparoscopic hysterectomy BSO and 20 pelvic node evaluation by Dr Skeet Latch on 11-24-2010. Pathology [phone]) had endometrial adenocarcinoma Grade I with focal squamous differentiation, superficial myometrial  invasion ( 0.2 cm where myometrium 1.4 cm); benign cervix, tubes and ovaries; no LVSI. With no high risk features, she was followed with observation only, including exams by gyn oncology every 6 months.  In early March 2015 she had acute onset of lower abdominal pain, then fever and nausea over next several days. She was treated by PCP for presumed diverticulitis with cipro and flagyl, initially with improvement then symptoms recurrent in late March. She had CT AP 05-23-13 showed a 4.1x4.1 cm mass left anterior upper pelvis, and CT biopsy 05-31-13 of mesenteric mass at level of umbilicus showed metastatic adenocarcinoma consistent with endometrioid carcinoma. Patient saw Dr Skeet Latch at [facility] on 06-08-13, with exam remarkable for palpable right pelvic mass and recommendation for 3 cycles of taxane / platin chemotherapy, then repeat imaging; may also consider radiation after chemotherapy. The initial abdominal pain resolved entirely. Cycle 1 taxol carboplatin was given 06-26-2013; she was neutropenic with ANC 0 on day 14. Repeat CT AP after cycle 3 had partial response of dominant ventral peritoneal mass, right pelvic sidewall mass and implant along sigmoid colon. She completed additional 3 cycles of carboplatin and taxol for total 6 cycles on 10-18-13, with neulasta support.  Review of systems as above, also: No fever or symptoms of infection. No bleeding. No abdominal or pelvic pain. No SOB or cough. Remainder of 10 point Review of Systems negative.  Objective:  Vital signs in last 24 hours:  BP 141/75  Pulse 90  Temp(Src) 98.5 F (36.9 C) (Oral)  Resp 20  Ht _0  (1.702 m)  Wt 206 lb 12.8 oz (93.804 kg)  BMI 32.38 kg/m2 Weight is  stable Alert, oriented and appropriate. Ambulatory without difficulty. Talkative (on steroids) Alopecia  HEENT:PERRL, sclerae not icteric. Oral mucosa moist without lesions, posterior pharynx clear.  Neck supple. No JVD.  Lymphatics:no cervical,supraclavicular  adenopathy Resp: clear to auscultation bilaterally and normal percussion bilaterally Cardio: regular rate and rhythm. No gallop. GI: soft, nontender, not distended, no mass or organomegaly. Normally active bowel sounds. Surgical incision not remarkable. Musculoskeletal/ Extremities: without pitting edema, cords, tenderness Neuro: no significant peripheral neuropathy. Otherwise nonfocal. PSYCH appropriate mood and affect Skin RIght forearm at site of carbo test dose <0.5 cm erythema, slightly palpable. Otherwise without rash, ecchymosis, petechiae   Lab Results:  Results for orders placed in visit on 12/03/13  CBC WITH DIFFERENTIAL      Result Value Ref Range   WBC 5.8  3.9 - 10.3 10e3/uL   NEUT# 5.2  1.5 - 6.5 10e3/uL   HGB 13.1  11.6 - 15.9 g/dL   HCT 39.4  34.8 - 46.6 %   Platelets 185  145 - 400 10e3/uL   MCV 94.1  79.5 - 101.0 fL   MCH 31.2  25.1 - 34.0 pg   MCHC 33.2  31.5 - 36.0 g/dL   RBC 4.19  3.70 - 5.45 10e6/uL   RDW 14.3  11.2 - 14.5 %   lymph# 0.6 (*) 0.9 - 3.3 10e3/uL   MONO# 0.0 (*) 0.1 - 0.9 10e3/uL   Eosinophils Absolute 0.0  0.0 - 0.5 10e3/uL   Basophils Absolute 0.0  0.0 - 0.1 10e3/uL   NEUT% 89.7 (*) 38.4 - 76.8 %   LYMPH% 9.6 (*) 14.0 - 49.7 %   MONO% 0.4  0.0 - 14.0 %   EOS% 0.0  0.0 - 7.0 %   BASO% 0.3  0.0 - 2.0 %  COMPREHENSIVE METABOLIC PANEL (QH47)      Result Value Ref Range   Sodium 134 (*) 136 - 145 mEq/L   Potassium 3.5  3.5 - 5.1 mEq/L   Chloride 98  98 - 109 mEq/L   CO2 21 (*) 22 - 29 mEq/L   Glucose 176 (*) 70 - 140 mg/dl   BUN 13.7  7.0 - 26.0 mg/dL   Creatinine 0.8  0.6 - 1.1 mg/dL   Total Bilirubin 0.46  0.20 - 1.20 mg/dL   Alkaline Phosphatase 69  40 - 150 U/L   AST 22  5 - 34 U/L   ALT 24  0 - 55 U/L   Total Protein 7.4  6.4 - 8.3 g/dL   Albumin 3.8  3.5 - 5.0 g/dL   Calcium 10.0  8.4 - 10.4 mg/dL   Anion Gap 15 (*) 3 - 11 mEq/L     Studies/Results:  No results found.  Medications: I have reviewed the patient's current  medications. She is still having difficulty with oral potassium, I believe KDur, as this causes nausea. Her pharmacist recommended dissolving in applesauce, which she has not tried; she has increased K in diet and continues herbal K supplement.  DISCUSSION: patient and husband are pleased with better tolerance of this regimen so far and in agreement with continuing.  Assessment/Plan:   1.Endometrial carcinoma initially IA grade 1 at surgery 11-2010, then multifocal recurrence in pelvis found April 2015. Partial response by CT to 6 cycles of q 3 week carboplatin and taxol, now continuing with dose dense regimen planned for total 3 additional cycles if tolerates. Day 8 cycle 1 today. She will have day 15 cycle 1 on 12-10-13 as long as  ANC >=1.5 and plt >=100k. She has not had g CSF with this cycle. I will see her back on 12-17-13 with day 1 cycle 2 then. If she reacts to next skin test would consider single agent taxol for now (not discussed). 2.taxol related peripheral neuropathy: intermittent in feet and not worse since most recent treatment. Continue to massage and exercise feet regularly  3.HTN  4.fatty liver and obesity  5.diverticulosis: not symptomatic now.  6.hypokalemia improved: has increased in diet and still trying to find tolerable oral supplement.    All questions answered. Chemo orders confirmed. They will call prior to next scheduled viist if needed. Time spent 25 min including >50% counseling and coordination of care.   Rusty Villella P, MD   12/03/2013, 12:54 PM

## 2013-12-03 NOTE — Patient Instructions (Signed)
[facility] Discharge Instructions for Patients Receiving Chemotherapy  Today you received the following chemotherapy agents: taxol  To help prevent nausea and vomiting after your treatment, we encourage you to take your nausea medication as prescribed.    If you develop nausea and vomiting that is not controlled by your nausea medication, call the clinic.   BELOW ARE SYMPTOMS THAT SHOULD BE REPORTED IMMEDIATELY:  *FEVER GREATER THAN 100.5 F  *CHILLS WITH OR WITHOUT FEVER  NAUSEA AND VOMITING THAT IS NOT CONTROLLED WITH YOUR NAUSEA MEDICATION  *UNUSUAL SHORTNESS OF BREATH  *UNUSUAL BRUISING OR BLEEDING  TENDERNESS IN MOUTH AND THROAT WITH OR WITHOUT PRESENCE OF ULCERS  *URINARY PROBLEMS  *BOWEL PROBLEMS  UNUSUAL RASH Items with * indicate a potential emergency and should be followed up as soon as possible.  Feel free to call the clinic you have any questions or concerns. The clinic phone number is [phone].    

## 2013-12-04 ENCOUNTER — Other Ambulatory Visit: Payer: Self-pay | Admitting: Oncology

## 2013-12-07 ENCOUNTER — Other Ambulatory Visit: Payer: Self-pay

## 2013-12-07 DIAGNOSIS — C541 Malignant neoplasm of endometrium: Secondary | ICD-10-CM

## 2013-12-07 MED ORDER — POTASSIUM CHLORIDE 20 MEQ/15ML (10%) PO LIQD: 10.0000 meq | Freq: Two times a day (BID) | ORAL | Status: DC

## 2013-12-07 NOTE — Progress Notes (Signed)
Ms. Oswaldo Done will try potassium liquid instead of tabs to see if this will not make her sick.

## 2013-12-10 ENCOUNTER — Other Ambulatory Visit (HOSPITAL_BASED_OUTPATIENT_CLINIC_OR_DEPARTMENT_OTHER): Payer: Medicare Other

## 2013-12-10 ENCOUNTER — Ambulatory Visit (HOSPITAL_BASED_OUTPATIENT_CLINIC_OR_DEPARTMENT_OTHER): Payer: Medicare Other

## 2013-12-10 VITALS — BP 136/83 | HR 92 | Temp 98.1°F | Resp 19

## 2013-12-10 DIAGNOSIS — C541 Malignant neoplasm of endometrium: Secondary | ICD-10-CM

## 2013-12-10 DIAGNOSIS — Z5111 Encounter for antineoplastic chemotherapy: Secondary | ICD-10-CM

## 2013-12-10 DIAGNOSIS — C549 Malignant neoplasm of corpus uteri, unspecified: Secondary | ICD-10-CM

## 2013-12-10 LAB — COMPREHENSIVE METABOLIC PANEL (CC13)
ALK PHOS: 62 U/L (ref 40–150)
ALT: 27 U/L (ref 0–55)
AST: 20 U/L (ref 5–34)
Albumin: 3.8 g/dL (ref 3.5–5.0)
Anion Gap: 12 mEq/L — ABNORMAL HIGH (ref 3–11)
BILIRUBIN TOTAL: 0.47 mg/dL (ref 0.20–1.20)
BUN: 14 mg/dL (ref 7.0–26.0)
CALCIUM: 9.7 mg/dL (ref 8.4–10.4)
CO2: 22 mEq/L (ref 22–29)
CREATININE: 0.8 mg/dL (ref 0.6–1.1)
Chloride: 101 mEq/L (ref 98–109)
Glucose: 134 mg/dl (ref 70–140)
Potassium: 3.5 mEq/L (ref 3.5–5.1)
Sodium: 135 mEq/L — ABNORMAL LOW (ref 136–145)
Total Protein: 6.9 g/dL (ref 6.4–8.3)

## 2013-12-10 LAB — CBC WITH DIFFERENTIAL/PLATELET
BASO%: 0 % (ref 0.0–2.0)
BASOS ABS: 0 10*3/uL (ref 0.0–0.1)
EOS%: 0 % (ref 0.0–7.0)
Eosinophils Absolute: 0 10*3/uL (ref 0.0–0.5)
HEMATOCRIT: 37.1 % (ref 34.8–46.6)
HEMOGLOBIN: 12.5 g/dL (ref 11.6–15.9)
LYMPH#: 0.6 10*3/uL — AB (ref 0.9–3.3)
LYMPH%: 25.6 % (ref 14.0–49.7)
MCH: 31.6 pg (ref 25.1–34.0)
MCHC: 33.7 g/dL (ref 31.5–36.0)
MCV: 93.9 fL (ref 79.5–101.0)
MONO#: 0.1 10*3/uL (ref 0.1–0.9)
MONO%: 2.1 % (ref 0.0–14.0)
NEUT#: 1.8 10*3/uL (ref 1.5–6.5)
NEUT%: 72.3 % (ref 38.4–76.8)
Platelets: 148 10*3/uL (ref 145–400)
RBC: 3.95 10*6/uL (ref 3.70–5.45)
RDW: 13.8 % (ref 11.2–14.5)
WBC: 2.4 10*3/uL — ABNORMAL LOW (ref 3.9–10.3)

## 2013-12-10 MED ORDER — FAMOTIDINE IN NACL 20-0.9 MG/50ML-% IV SOLN
INTRAVENOUS | Status: AC
  Filled 2013-12-10: qty 50

## 2013-12-10 MED ORDER — DIPHENHYDRAMINE HCL 50 MG/ML IJ SOLN
INTRAMUSCULAR | Status: AC
  Filled 2013-12-10: qty 1

## 2013-12-10 MED ORDER — PACLITAXEL CHEMO INJECTION 300 MG/50ML
80.0000 mg/m2 | Freq: Once | INTRAVENOUS | Status: AC
  Administered 2013-12-10: 168 mg via INTRAVENOUS
  Filled 2013-12-10: qty 28

## 2013-12-10 MED ORDER — ONDANSETRON 8 MG/50ML IVPB (CHCC)
8.0000 mg | Freq: Once | INTRAVENOUS | Status: AC
  Administered 2013-12-10: 8 mg via INTRAVENOUS

## 2013-12-10 MED ORDER — SODIUM CHLORIDE 0.9 % IV SOLN
Freq: Once | INTRAVENOUS | Status: AC
  Administered 2013-12-10: 10:00:00 via INTRAVENOUS

## 2013-12-10 MED ORDER — FAMOTIDINE IN NACL 20-0.9 MG/50ML-% IV SOLN
20.0000 mg | Freq: Once | INTRAVENOUS | Status: AC
  Administered 2013-12-10: 20 mg via INTRAVENOUS

## 2013-12-10 MED ORDER — DEXAMETHASONE SODIUM PHOSPHATE 20 MG/5ML IJ SOLN
20.0000 mg | Freq: Once | INTRAMUSCULAR | Status: AC
  Administered 2013-12-10: 20 mg via INTRAVENOUS

## 2013-12-10 MED ORDER — DIPHENHYDRAMINE HCL 50 MG/ML IJ SOLN
50.0000 mg | Freq: Once | INTRAMUSCULAR | Status: AC
  Administered 2013-12-10: 50 mg via INTRAVENOUS

## 2013-12-10 MED ORDER — DEXAMETHASONE SODIUM PHOSPHATE 20 MG/5ML IJ SOLN
INTRAMUSCULAR | Status: AC
  Filled 2013-12-10: qty 5

## 2013-12-10 MED ORDER — ONDANSETRON 8 MG/NS 50 ML IVPB
INTRAVENOUS | Status: AC
  Filled 2013-12-10: qty 8

## 2013-12-10 NOTE — Patient Instructions (Signed)
[facility] Discharge Instructions for Patients Receiving Chemotherapy  Today you received the following chemotherapy agents: Taxol  To help prevent nausea and vomiting after your treatment, we encourage you to take your nausea medication as prescribed by your physician.  If you develop nausea and vomiting that is not controlled by your nausea medication, call the clinic.   BELOW ARE SYMPTOMS THAT SHOULD BE REPORTED IMMEDIATELY:  *FEVER GREATER THAN 100.5 F  *CHILLS WITH OR WITHOUT FEVER  NAUSEA AND VOMITING THAT IS NOT CONTROLLED WITH YOUR NAUSEA MEDICATION  *UNUSUAL SHORTNESS OF BREATH  *UNUSUAL BRUISING OR BLEEDING  TENDERNESS IN MOUTH AND THROAT WITH OR WITHOUT PRESENCE OF ULCERS  *URINARY PROBLEMS  *BOWEL PROBLEMS  UNUSUAL RASH Items with * indicate a potential emergency and should be followed up as soon as possible.  Feel free to call the clinic you have any questions or concerns. The clinic phone number is [phone].    

## 2013-12-11 ENCOUNTER — Telehealth: Payer: Self-pay | Admitting: *Deleted

## 2013-12-11 NOTE — Telephone Encounter (Signed)
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 Told patient I think she will be fine to go - just to be mindful of good hand hygiene and of course if she notices any signs or symptoms of infection or develops a cold herself to not go. Patient agreeable to this.

## 2013-12-16 ENCOUNTER — Other Ambulatory Visit: Payer: Self-pay | Admitting: Oncology

## 2013-12-17 ENCOUNTER — Ambulatory Visit (HOSPITAL_BASED_OUTPATIENT_CLINIC_OR_DEPARTMENT_OTHER): Payer: Medicare Other | Admitting: Oncology

## 2013-12-17 ENCOUNTER — Encounter: Payer: Self-pay | Admitting: Oncology

## 2013-12-17 ENCOUNTER — Other Ambulatory Visit (HOSPITAL_BASED_OUTPATIENT_CLINIC_OR_DEPARTMENT_OTHER): Payer: Medicare Other

## 2013-12-17 ENCOUNTER — Ambulatory Visit: Payer: Medicare Other

## 2013-12-17 ENCOUNTER — Telehealth: Payer: Self-pay | Admitting: Oncology

## 2013-12-17 ENCOUNTER — Telehealth: Payer: Self-pay | Admitting: *Deleted

## 2013-12-17 VITALS — BP 147/73 | HR 96 | Temp 98.3°F | Resp 18 | Ht 67.0 in | Wt 208.9 lb

## 2013-12-17 DIAGNOSIS — G620 Drug-induced polyneuropathy: Secondary | ICD-10-CM

## 2013-12-17 DIAGNOSIS — E876 Hypokalemia: Secondary | ICD-10-CM

## 2013-12-17 DIAGNOSIS — C7989 Secondary malignant neoplasm of other specified sites: Secondary | ICD-10-CM

## 2013-12-17 DIAGNOSIS — Z5189 Encounter for other specified aftercare: Secondary | ICD-10-CM

## 2013-12-17 DIAGNOSIS — C549 Malignant neoplasm of corpus uteri, unspecified: Secondary | ICD-10-CM

## 2013-12-17 DIAGNOSIS — C541 Malignant neoplasm of endometrium: Secondary | ICD-10-CM

## 2013-12-17 LAB — CBC WITH DIFFERENTIAL/PLATELET
BASO%: 0.4 % (ref 0.0–2.0)
Basophils Absolute: 0 10*3/uL (ref 0.0–0.1)
EOS ABS: 0 10*3/uL (ref 0.0–0.5)
EOS%: 0 % (ref 0.0–7.0)
HEMATOCRIT: 37.2 % (ref 34.8–46.6)
HGB: 12.2 g/dL (ref 11.6–15.9)
LYMPH%: 36.7 % (ref 14.0–49.7)
MCH: 31.3 pg (ref 25.1–34.0)
MCHC: 32.7 g/dL (ref 31.5–36.0)
MCV: 95.8 fL (ref 79.5–101.0)
MONO#: 0 10*3/uL — AB (ref 0.1–0.9)
MONO%: 1.2 % (ref 0.0–14.0)
NEUT#: 1.2 10*3/uL — ABNORMAL LOW (ref 1.5–6.5)
NEUT%: 61.7 % (ref 38.4–76.8)
Platelets: 151 10*3/uL (ref 145–400)
RBC: 3.88 10*6/uL (ref 3.70–5.45)
RDW: 15.5 % — ABNORMAL HIGH (ref 11.2–14.5)
WBC: 1.9 10*3/uL — AB (ref 3.9–10.3)
lymph#: 0.7 10*3/uL — ABNORMAL LOW (ref 0.9–3.3)

## 2013-12-17 LAB — COMPREHENSIVE METABOLIC PANEL (CC13)
ALBUMIN: 3.8 g/dL (ref 3.5–5.0)
ALT: 27 U/L (ref 0–55)
ANION GAP: 13 meq/L — AB (ref 3–11)
AST: 19 U/L (ref 5–34)
Alkaline Phosphatase: 63 U/L (ref 40–150)
BILIRUBIN TOTAL: 0.54 mg/dL (ref 0.20–1.20)
BUN: 15.1 mg/dL (ref 7.0–26.0)
CO2: 22 meq/L (ref 22–29)
Calcium: 9.7 mg/dL (ref 8.4–10.4)
Chloride: 100 mEq/L (ref 98–109)
Creatinine: 1.1 mg/dL (ref 0.6–1.1)
GLUCOSE: 203 mg/dL — AB (ref 70–140)
Potassium: 3.6 mEq/L (ref 3.5–5.1)
SODIUM: 136 meq/L (ref 136–145)
TOTAL PROTEIN: 6.9 g/dL (ref 6.4–8.3)

## 2013-12-17 MED ORDER — TBO-FILGRASTIM 300 MCG/0.5ML ~~LOC~~ SOSY
300.0000 ug | PREFILLED_SYRINGE | Freq: Once | SUBCUTANEOUS | Status: AC
  Administered 2013-12-17: 300 ug via SUBCUTANEOUS
  Filled 2013-12-17: qty 0.5

## 2013-12-17 MED ORDER — FILGRASTIM 300 MCG/0.5ML IJ SOLN: 300.0000 ug | Freq: Once | INTRAMUSCULAR | Status: DC

## 2013-12-17 NOTE — Telephone Encounter (Signed)
, °

## 2013-12-17 NOTE — Progress Notes (Signed)
OFFICE PROGRESS NOTE   12/17/2013   Physicians:Brewster, Canary Brim, Lorri Frederick, Louie Casa ([facility])   INTERVAL HISTORY:   Patient is seen, together with husband, in continuing attention to chemotherapy in process for recurrent endometrial carcinoma. She is tolerating present dose dense carbo taxol better than previous q 3 week regimen, however ANC is too low today for planned day 1 cycle 2 treatment. She has not had gCSF with this dose dense regimen, did require neulasta with q 3 week dosing. Plan is total 3 cycles of dose dense then reimaging. She is to see Dr Skeet Latch next 02-11-14.  Patient has felt well overall this week, without symptoms of infection or marked fatigue. She did take premed decadron last pm. She has intermittent slight numbness in toes which is not bothersome, none in hands. She has no abdominal or pelvic discomfort and no bleeding. Appetite is fine. Bowels are moving.  She does not have PAC She had flu vaccine.  ONCOLOGIC HISTORY Oncology History   [place] FIGO grade I endometrial carcinoma diagnosed 09-2010, treated with [place] BSO pelvic nodes 11-24-2010 . Recurrent in pelvis 04-2013 confirmed by biopsy     Endometrial cancer   11/02/2010 Initial Diagnosis Endometrial cancer  Patient presented with vaginal spotting 07-2010. Endometrial biopsy by Dr Clovia Cuff 10-21-10 revealed endometroid carcinoma grade 1 focally involving polyp. She had robotic assisted total laparoscopic hysterectomy BSO and 20 pelvic node evaluation by Dr Skeet Latch on 11-24-2010. Pathology [id number]) had endometrial adenocarcinoma Grade I with focal squamous differentiation, superficial myometrial invasion ( 0.2 cm where myometrium 1.4 cm); benign cervix, tubes and ovaries; no LVSI. With no high risk features, she was followed with observation only, including exams by gyn oncology every 6 months.  In early March 2015 she had acute onset of lower abdominal pain, then fever and nausea over next  several days. She was treated by PCP for presumed diverticulitis with cipro and flagyl, initially with improvement then symptoms recurrent in late March. She had CT AP 05-23-13 showed a 4.1x4.1 cm mass left anterior upper pelvis, and CT biopsy 05-31-13 of mesenteric mass at level of umbilicus showed metastatic adenocarcinoma consistent with endometrioid carcinoma. Patient saw Dr Skeet Latch at [facility] on 06-08-13, with exam remarkable for palpable right pelvic mass and recommendation for 3 cycles of taxane / platin chemotherapy, then repeat imaging; may also consider radiation after chemotherapy. The initial abdominal pain resolved entirely. Cycle 1 taxol carboplatin was given 06-26-2013; she was neutropenic with ANC 0 on day 14. Repeat CT AP after cycle 3 had partial response of dominant ventral peritoneal mass, right pelvic sidewall mass and implant along sigmoid colon. She completed additional 3 cycles of carboplatin and taxol for total 6 cycles on 10-18-13, with neulasta support. Restaging CT showed partial response but still residual involvement including intraperitoneal disease, with additional 3 cycles carbo taxol recommended. She had cycle 1 day 1 dose dense carbo taxol on 11-26-13, with carbo skin test.   Review of systems as above, also: No SOB with present activity level. She was able to work at [employer] one day this week. No bleeding. No LE swelling. Remainder of 10 point Review of Systems negative.  Objective:  Vital signs in last 24 hours:  BP 147/73  Pulse 96  Temp(Src) 98.3 F (36.8 C) (Oral)  Resp 18  Ht 5\' 7"  (1.702 m)  Wt 208 lb 14.4 oz (94.756 kg)  BMI 32.71 kg/m2  Weight up 2 lbs.  Alert, oriented and appropriate. Ambulatory without difficulty.  Alopecia  HEENT:PERRL, sclerae not icteric. Oral mucosa moist without lesions, posterior pharynx clear.  Neck supple. No JVD.  Lymphatics:no cervical,supraclavicular, axillary or inguinal adenopathy Resp: clear to auscultation bilaterally  and normal percussion bilaterally Cardio: regular rate and rhythm. No gallop. GI: soft, nontender, not distended, no mass or organomegaly. Normally active bowel sounds. Surgical incision not remarkable. Musculoskeletal/ Extremities: without pitting edema, cords, tenderness Neuro: no significant peripheral neuropathy. Otherwise nonfocal Skin without rash, ecchymosis, petechiae Breasts: without dominant mass, skin or nipple findings. Axillae benign.  Lab Results:  Results for orders placed in visit on 12/17/13  CBC WITH DIFFERENTIAL      Result Value Ref Range   WBC 1.9 (*) 3.9 - 10.3 10e3/uL   NEUT# 1.2 (*) 1.5 - 6.5 10e3/uL   HGB 12.2  11.6 - 15.9 g/dL   HCT 37.2  34.8 - 46.6 %   Platelets 151  145 - 400 10e3/uL   MCV 95.8  79.5 - 101.0 fL   MCH 31.3  25.1 - 34.0 pg   MCHC 32.7  31.5 - 36.0 g/dL   RBC 3.88  3.70 - 5.45 10e6/uL   RDW 15.5 (*) 11.2 - 14.5 %   lymph# 0.7 (*) 0.9 - 3.3 10e3/uL   MONO# 0.0 (*) 0.1 - 0.9 10e3/uL   Eosinophils Absolute 0.0  0.0 - 0.5 10e3/uL   Basophils Absolute 0.0  0.0 - 0.1 10e3/uL   NEUT% 61.7  38.4 - 76.8 %   LYMPH% 36.7  14.0 - 49.7 %   MONO% 1.2  0.0 - 14.0 %   EOS% 0.0  0.0 - 7.0 %   BASO% 0.4  0.0 - 2.0 %  COMPREHENSIVE METABOLIC PANEL (FI43)      Result Value Ref Range   Sodium 136  136 - 145 mEq/L   Potassium 3.6  3.5 - 5.1 mEq/L   Chloride 100  98 - 109 mEq/L   CO2 22  22 - 29 mEq/L   Glucose 203 (*) 70 - 140 mg/dl   BUN 15.1  7.0 - 26.0 mg/dL   Creatinine 1.1  0.6 - 1.1 mg/dL   Total Bilirubin 0.54  0.20 - 1.20 mg/dL   Alkaline Phosphatase 63  40 - 150 U/L   AST 19  5 - 34 U/L   ALT 27  0 - 55 U/L   Total Protein 6.9  6.4 - 8.3 g/dL   Albumin 3.8  3.5 - 5.0 g/dL   Calcium 9.7  8.4 - 10.4 mg/dL   Anion Gap 13 (*) 3 - 11 mEq/L     Studies/Results:  No results found.  Medications: I have reviewed the patient's current medications. Tolerating liquid potassium without difficulty. She will bring previous K tablets to [facility]  pharmacist, as she wonders if these are not actually K. Granix 300 today.  DISCUSSION: ANC too low for start of cycle 2 today, so will delay x 1 week, treating on 12-24-13 as long as ANC >=1.5 and plt >=100k, with addition of neupogen/granix 300 on days 2,9,16. Hopefully she will not have significant aches with this gCSF, but can certainly take Claritin with these.  Assessment/Plan: 1.Endometrial carcinoma initially IA grade 1 at surgery 11-2010, then multifocal recurrence in pelvis found April 2015. Partial response by CT to 6 cycles of q 3 week carboplatin and taxol, now continuing with dose dense regimen planned for total 3 additional cycles if tolerates. Delay start of cycle 2 for one week, add neupogen as above. If she reacts  to [place] skin test would consider single agent taxol (not discussed).  2.taxol related peripheral neuropathy: intermittent in feet and not worse since most recent treatment. Continue to massage and exercise feet regularly  3.HTN  4.fatty liver and obesity  5.diverticulosis: not symptomatic now.  6.hypokalemia improved: continue liquid K supplement + herbal preparation + diet   Chemo dates changed, gCSF added. Patient and husband understand all of discussion and are in agreement with plans.     Jesslyn Viglione P, MD   12/17/2013, 3:49 PM

## 2013-12-17 NOTE — Telephone Encounter (Signed)
Per staff message and POF I have scheduled appts. Advised scheduler of appts. JMW  

## 2013-12-24 ENCOUNTER — Ambulatory Visit (HOSPITAL_BASED_OUTPATIENT_CLINIC_OR_DEPARTMENT_OTHER): Payer: Medicare Other

## 2013-12-24 ENCOUNTER — Other Ambulatory Visit: Payer: Medicare Other

## 2013-12-24 ENCOUNTER — Encounter: Payer: Self-pay | Admitting: Oncology

## 2013-12-24 ENCOUNTER — Other Ambulatory Visit (HOSPITAL_BASED_OUTPATIENT_CLINIC_OR_DEPARTMENT_OTHER): Payer: Medicare Other

## 2013-12-24 DIAGNOSIS — Z5111 Encounter for antineoplastic chemotherapy: Secondary | ICD-10-CM

## 2013-12-24 DIAGNOSIS — C541 Malignant neoplasm of endometrium: Secondary | ICD-10-CM

## 2013-12-24 DIAGNOSIS — C549 Malignant neoplasm of corpus uteri, unspecified: Secondary | ICD-10-CM

## 2013-12-24 LAB — COMPREHENSIVE METABOLIC PANEL (CC13)
ALT: 26 U/L (ref 0–55)
ANION GAP: 15 meq/L — AB (ref 3–11)
AST: 19 U/L (ref 5–34)
Albumin: 3.8 g/dL (ref 3.5–5.0)
Alkaline Phosphatase: 72 U/L (ref 40–150)
BUN: 10.1 mg/dL (ref 7.0–26.0)
CALCIUM: 9.7 mg/dL (ref 8.4–10.4)
CHLORIDE: 99 meq/L (ref 98–109)
CO2: 21 meq/L — AB (ref 22–29)
Creatinine: 0.9 mg/dL (ref 0.6–1.1)
Glucose: 216 mg/dl — ABNORMAL HIGH (ref 70–140)
POTASSIUM: 3.2 meq/L — AB (ref 3.5–5.1)
SODIUM: 135 meq/L — AB (ref 136–145)
TOTAL PROTEIN: 7.3 g/dL (ref 6.4–8.3)
Total Bilirubin: 0.47 mg/dL (ref 0.20–1.20)

## 2013-12-24 LAB — CBC WITH DIFFERENTIAL/PLATELET
BASO%: 0 % (ref 0.0–2.0)
Basophils Absolute: 0 10*3/uL (ref 0.0–0.1)
EOS%: 0.2 % (ref 0.0–7.0)
Eosinophils Absolute: 0 10*3/uL (ref 0.0–0.5)
HCT: 36.8 % (ref 34.8–46.6)
HGB: 12.5 g/dL (ref 11.6–15.9)
LYMPH#: 1.1 10*3/uL (ref 0.9–3.3)
LYMPH%: 18.6 % (ref 14.0–49.7)
MCH: 32.1 pg (ref 25.1–34.0)
MCHC: 34 g/dL (ref 31.5–36.0)
MCV: 94.6 fL (ref 79.5–101.0)
MONO#: 0.1 10*3/uL (ref 0.1–0.9)
MONO%: 2.4 % (ref 0.0–14.0)
NEUT%: 78.8 % — ABNORMAL HIGH (ref 38.4–76.8)
NEUTROS ABS: 4.6 10*3/uL (ref 1.5–6.5)
Platelets: 139 10*3/uL — ABNORMAL LOW (ref 145–400)
RBC: 3.89 10*6/uL (ref 3.70–5.45)
RDW: 15.4 % — ABNORMAL HIGH (ref 11.2–14.5)
WBC: 5.9 10*3/uL (ref 3.9–10.3)

## 2013-12-24 MED ORDER — FAMOTIDINE IN NACL 20-0.9 MG/50ML-% IV SOLN
INTRAVENOUS | Status: AC
  Filled 2013-12-24: qty 50

## 2013-12-24 MED ORDER — SODIUM CHLORIDE 0.9 % IV SOLN
Freq: Once | INTRAVENOUS | Status: AC
  Administered 2013-12-24: 15:00:00 via INTRAVENOUS

## 2013-12-24 MED ORDER — SODIUM CHLORIDE 0.9 % IV SOLN
534.5000 mg | Freq: Once | INTRAVENOUS | Status: AC
  Administered 2013-12-24: 530 mg via INTRAVENOUS
  Filled 2013-12-24: qty 53

## 2013-12-24 MED ORDER — PACLITAXEL CHEMO INJECTION 300 MG/50ML
80.0000 mg/m2 | Freq: Once | INTRAVENOUS | Status: AC
  Administered 2013-12-24: 168 mg via INTRAVENOUS
  Filled 2013-12-24: qty 28

## 2013-12-24 MED ORDER — CARBOPLATIN CHEMO INTRADERMAL TEST DOSE 100MCG/0.02ML
100.0000 ug | Freq: Once | INTRADERMAL | Status: AC
  Administered 2013-12-24: 100 ug via INTRADERMAL
  Filled 2013-12-24: qty 0.01

## 2013-12-24 MED ORDER — DIPHENHYDRAMINE HCL 50 MG/ML IJ SOLN
INTRAMUSCULAR | Status: AC
  Filled 2013-12-24: qty 1

## 2013-12-24 MED ORDER — DEXAMETHASONE SODIUM PHOSPHATE 20 MG/5ML IJ SOLN
INTRAMUSCULAR | Status: AC
  Filled 2013-12-24: qty 5

## 2013-12-24 MED ORDER — DEXAMETHASONE SODIUM PHOSPHATE 20 MG/5ML IJ SOLN
20.0000 mg | Freq: Once | INTRAMUSCULAR | Status: AC
  Administered 2013-12-24: 20 mg via INTRAVENOUS

## 2013-12-24 MED ORDER — ONDANSETRON 16 MG/50ML IVPB (CHCC)
16.0000 mg | Freq: Once | INTRAVENOUS | Status: AC
  Administered 2013-12-24: 16 mg via INTRAVENOUS

## 2013-12-24 MED ORDER — ONDANSETRON 16 MG/50ML IVPB (CHCC)
INTRAVENOUS | Status: AC
  Filled 2013-12-24: qty 16

## 2013-12-24 MED ORDER — FAMOTIDINE IN NACL 20-0.9 MG/50ML-% IV SOLN
20.0000 mg | Freq: Once | INTRAVENOUS | Status: AC
  Administered 2013-12-24: 20 mg via INTRAVENOUS

## 2013-12-24 MED ORDER — DIPHENHYDRAMINE HCL 50 MG/ML IJ SOLN
50.0000 mg | Freq: Once | INTRAMUSCULAR | Status: AC
  Administered 2013-12-24: 50 mg via INTRAVENOUS

## 2013-12-24 NOTE — Patient Instructions (Signed)
[facility] Discharge Instructions for Patients Receiving Chemotherapy  Today you received the following chemotherapy agents: Taxol and Carboplatin.  To help prevent nausea and vomiting after your treatment, we encourage you to take your nausea medication, Zofran. Take every 8 hours as needed.   If you develop nausea and vomiting that is not controlled by your nausea medication, call the clinic.   BELOW ARE SYMPTOMS THAT SHOULD BE REPORTED IMMEDIATELY:  *FEVER GREATER THAN 100.5 F  *CHILLS WITH OR WITHOUT FEVER  NAUSEA AND VOMITING THAT IS NOT CONTROLLED WITH YOUR NAUSEA MEDICATION  *UNUSUAL SHORTNESS OF BREATH  *UNUSUAL BRUISING OR BLEEDING  TENDERNESS IN MOUTH AND THROAT WITH OR WITHOUT PRESENCE OF ULCERS  *URINARY PROBLEMS  *BOWEL PROBLEMS  UNUSUAL RASH Items with * indicate a potential emergency and should be followed up as soon as possible.  Feel free to call the clinic should you have any questions or concerns. The clinic phone number is [phone].

## 2013-12-24 NOTE — Progress Notes (Signed)
Carbo skin test negative 

## 2013-12-25 ENCOUNTER — Telehealth: Payer: Self-pay | Admitting: *Deleted

## 2013-12-25 ENCOUNTER — Ambulatory Visit (HOSPITAL_BASED_OUTPATIENT_CLINIC_OR_DEPARTMENT_OTHER): Payer: Medicare Other

## 2013-12-25 DIAGNOSIS — C541 Malignant neoplasm of endometrium: Secondary | ICD-10-CM | POA: Diagnosis not present

## 2013-12-25 DIAGNOSIS — C7989 Secondary malignant neoplasm of other specified sites: Secondary | ICD-10-CM

## 2013-12-25 DIAGNOSIS — Z5189 Encounter for other specified aftercare: Secondary | ICD-10-CM | POA: Diagnosis not present

## 2013-12-25 MED ORDER — TBO-FILGRASTIM 300 MCG/0.5ML ~~LOC~~ SOSY
300.0000 ug | PREFILLED_SYRINGE | Freq: Once | SUBCUTANEOUS | Status: AC
  Administered 2013-12-25: 300 ug via SUBCUTANEOUS
  Filled 2013-12-25: qty 0.5

## 2013-12-25 NOTE — Patient Instructions (Signed)

## 2013-12-25 NOTE — Telephone Encounter (Signed)
-----   Message from Gordy Levan, MD sent at 12/25/2013  3:07 PM EST ----- Labs seen and need follow up: please have her take extra 20 mEq K today, then continue regular dosing

## 2013-12-25 NOTE — Telephone Encounter (Signed)
Notified Erika Strong of low K+.  Erika Strong reports she didn't take her K+ for 3 days & now knows she has to take it.  She will take an extra 20 meq today per Dr Mariana Kaufman instructions & then cont reg dosing.

## 2013-12-27 ENCOUNTER — Ambulatory Visit: Payer: Medicare Other | Admitting: Gynecologic Oncology

## 2013-12-29 ENCOUNTER — Other Ambulatory Visit: Payer: Self-pay | Admitting: Oncology

## 2013-12-31 ENCOUNTER — Ambulatory Visit (HOSPITAL_BASED_OUTPATIENT_CLINIC_OR_DEPARTMENT_OTHER): Payer: Medicare Other | Admitting: Oncology

## 2013-12-31 ENCOUNTER — Ambulatory Visit (HOSPITAL_BASED_OUTPATIENT_CLINIC_OR_DEPARTMENT_OTHER): Payer: Medicare Other

## 2013-12-31 ENCOUNTER — Other Ambulatory Visit (HOSPITAL_BASED_OUTPATIENT_CLINIC_OR_DEPARTMENT_OTHER): Payer: Medicare Other

## 2013-12-31 ENCOUNTER — Encounter: Payer: Self-pay | Admitting: Oncology

## 2013-12-31 ENCOUNTER — Telehealth: Payer: Self-pay | Admitting: Oncology

## 2013-12-31 VITALS — BP 154/73 | HR 101 | Temp 98.7°F | Resp 20 | Ht 67.0 in | Wt 210.9 lb

## 2013-12-31 DIAGNOSIS — C541 Malignant neoplasm of endometrium: Secondary | ICD-10-CM

## 2013-12-31 DIAGNOSIS — C549 Malignant neoplasm of corpus uteri, unspecified: Secondary | ICD-10-CM

## 2013-12-31 DIAGNOSIS — G620 Drug-induced polyneuropathy: Secondary | ICD-10-CM

## 2013-12-31 DIAGNOSIS — C7989 Secondary malignant neoplasm of other specified sites: Secondary | ICD-10-CM

## 2013-12-31 DIAGNOSIS — Z5111 Encounter for antineoplastic chemotherapy: Secondary | ICD-10-CM

## 2013-12-31 LAB — COMPREHENSIVE METABOLIC PANEL (CC13)
ALBUMIN: 3.8 g/dL (ref 3.5–5.0)
ALT: 24 U/L (ref 0–55)
AST: 19 U/L (ref 5–34)
Alkaline Phosphatase: 70 U/L (ref 40–150)
Anion Gap: 14 mEq/L — ABNORMAL HIGH (ref 3–11)
BUN: 11.8 mg/dL (ref 7.0–26.0)
CALCIUM: 9.6 mg/dL (ref 8.4–10.4)
CO2: 22 mEq/L (ref 22–29)
CREATININE: 0.8 mg/dL (ref 0.6–1.1)
Chloride: 99 mEq/L (ref 98–109)
Glucose: 219 mg/dl — ABNORMAL HIGH (ref 70–140)
POTASSIUM: 3.7 meq/L (ref 3.5–5.1)
Sodium: 134 mEq/L — ABNORMAL LOW (ref 136–145)
Total Bilirubin: 0.41 mg/dL (ref 0.20–1.20)
Total Protein: 7 g/dL (ref 6.4–8.3)

## 2013-12-31 LAB — CBC WITH DIFFERENTIAL/PLATELET
BASO%: 0.3 % (ref 0.0–2.0)
BASOS ABS: 0 10*3/uL (ref 0.0–0.1)
EOS%: 0 % (ref 0.0–7.0)
Eosinophils Absolute: 0 10*3/uL (ref 0.0–0.5)
HEMATOCRIT: 36.5 % (ref 34.8–46.6)
HEMOGLOBIN: 12.2 g/dL (ref 11.6–15.9)
LYMPH#: 0.8 10*3/uL — AB (ref 0.9–3.3)
LYMPH%: 13.8 % — ABNORMAL LOW (ref 14.0–49.7)
MCH: 32 pg (ref 25.1–34.0)
MCHC: 33.3 g/dL (ref 31.5–36.0)
MCV: 96.2 fL (ref 79.5–101.0)
MONO#: 0 10*3/uL — ABNORMAL LOW (ref 0.1–0.9)
MONO%: 0.7 % (ref 0.0–14.0)
NEUT%: 85.2 % — AB (ref 38.4–76.8)
NEUTROS ABS: 5.2 10*3/uL (ref 1.5–6.5)
Platelets: 158 10*3/uL (ref 145–400)
RBC: 3.8 10*6/uL (ref 3.70–5.45)
RDW: 15.4 % — ABNORMAL HIGH (ref 11.2–14.5)
WBC: 6.1 10*3/uL (ref 3.9–10.3)

## 2013-12-31 MED ORDER — FAMOTIDINE IN NACL 20-0.9 MG/50ML-% IV SOLN
20.0000 mg | Freq: Once | INTRAVENOUS | Status: AC
Start: 2013-12-31 — End: 2013-12-31
  Administered 2013-12-31: 20 mg via INTRAVENOUS

## 2013-12-31 MED ORDER — DEXAMETHASONE SODIUM PHOSPHATE 20 MG/5ML IJ SOLN
INTRAMUSCULAR | Status: AC
  Filled 2013-12-31: qty 5

## 2013-12-31 MED ORDER — FAMOTIDINE IN NACL 20-0.9 MG/50ML-% IV SOLN
INTRAVENOUS | Status: AC
Start: 2013-12-31 — End: 2013-12-31
  Filled 2013-12-31: qty 50

## 2013-12-31 MED ORDER — DEXAMETHASONE SODIUM PHOSPHATE 20 MG/5ML IJ SOLN
20.0000 mg | Freq: Once | INTRAMUSCULAR | Status: AC
  Administered 2013-12-31: 20 mg via INTRAVENOUS

## 2013-12-31 MED ORDER — DIPHENHYDRAMINE HCL 50 MG/ML IJ SOLN
50.0000 mg | Freq: Once | INTRAMUSCULAR | Status: AC
Start: 2013-12-31 — End: 2013-12-31
  Administered 2013-12-31: 50 mg via INTRAVENOUS

## 2013-12-31 MED ORDER — ONDANSETRON 8 MG/NS 50 ML IVPB
INTRAVENOUS | Status: AC
  Filled 2013-12-31: qty 8

## 2013-12-31 MED ORDER — PACLITAXEL CHEMO INJECTION 300 MG/50ML
80.0000 mg/m2 | Freq: Once | INTRAVENOUS | Status: AC
  Administered 2013-12-31: 168 mg via INTRAVENOUS
  Filled 2013-12-31: qty 28

## 2013-12-31 MED ORDER — DIPHENHYDRAMINE HCL 50 MG/ML IJ SOLN
INTRAMUSCULAR | Status: AC
  Filled 2013-12-31: qty 1

## 2013-12-31 MED ORDER — SODIUM CHLORIDE 0.9 % IV SOLN
Freq: Once | INTRAVENOUS | Status: AC
  Administered 2013-12-31: 15:00:00 via INTRAVENOUS

## 2013-12-31 MED ORDER — LORAZEPAM 1 MG PO TABS: ORAL_TABLET | ORAL | Status: DC

## 2013-12-31 MED ORDER — ONDANSETRON 8 MG/50ML IVPB (CHCC)
8.0000 mg | Freq: Once | INTRAVENOUS | Status: AC
  Administered 2013-12-31: 8 mg via INTRAVENOUS

## 2013-12-31 NOTE — Telephone Encounter (Signed)
gv pt appt schedule for nov/dec. central will call w/ct appt - pt aware.

## 2013-12-31 NOTE — Progress Notes (Signed)
Put cancer policy on nurse's desk. °

## 2013-12-31 NOTE — Patient Instructions (Signed)
[facility] Discharge Instructions for Patients Receiving Chemotherapy  Today you received the following chemotherapy agents: Taxol  To help prevent nausea and vomiting after your treatment, we encourage you to take your nausea medication as prescribed by your physician.  If you develop nausea and vomiting that is not controlled by your nausea medication, call the clinic.   BELOW ARE SYMPTOMS THAT SHOULD BE REPORTED IMMEDIATELY:  *FEVER GREATER THAN 100.5 F  *CHILLS WITH OR WITHOUT FEVER  NAUSEA AND VOMITING THAT IS NOT CONTROLLED WITH YOUR NAUSEA MEDICATION  *UNUSUAL SHORTNESS OF BREATH  *UNUSUAL BRUISING OR BLEEDING  TENDERNESS IN MOUTH AND THROAT WITH OR WITHOUT PRESENCE OF ULCERS  *URINARY PROBLEMS  *BOWEL PROBLEMS  UNUSUAL RASH Items with * indicate a potential emergency and should be followed up as soon as possible.  Feel free to call the clinic you have any questions or concerns. The clinic phone number is [phone].    

## 2013-12-31 NOTE — Progress Notes (Signed)
OFFICE PROGRESS NOTE   12/31/2013   Physicians:Brewster, Canary Brim, Lorri Frederick, Louie Casa ([facility])  INTERVAL HISTORY:  Patient is seen, together with husband, in continuing attention to chemotherapy in process for recurrent endometrial carcinoma. Plan is total of 3 additional cycles of dose dense carbo taxol then repeat imaging; she is due day 8 cycle 2 today. WBC is maintaining better with granix day after each chemo, this added after cycle 2 delayed due to leukopenia. She is tolerating the dose dense regimen better than previous q 3 week dosing.  She is to see Dr Skeet Latch on 02-11-14.  She had some sternal pain ~ 2 days after granix, as well as low back discomfort then. She has had no other chest pain or cardiac type symptoms. She has intermittent tingling in toes not interfering with activity, none in fingers. She has not had significant nausea. She was able to work at [employer] for a short time this week. She had minimal erythema at site of [place] skin test, which appeared several days after skin test. She does not have PAC. She had flu vaccine     ONCOLOGIC HISTORY Oncology History   [place] FIGO grade I endometrial carcinoma diagnosed 09-2010, treated with [place] BSO pelvic nodes 11-24-2010 . Recurrent in pelvis 04-2013 confirmed by biopsy     Endometrial cancer   11/02/2010 Initial Diagnosis Endometrial cancer  Patient presented with vaginal spotting 07-2010. Endometrial biopsy by Dr Clovia Cuff 10-21-10 revealed endometroid carcinoma grade 1 focally involving polyp. She had robotic assisted total laparoscopic hysterectomy BSO and 20 pelvic node evaluation by Dr Skeet Latch on 11-24-2010. Pathology [phone]) had endometrial adenocarcinoma Grade I with focal squamous differentiation, superficial myometrial invasion ( 0.2 cm where myometrium 1.4 cm); benign cervix, tubes and ovaries; no LVSI. With no high risk features, she was followed with observation only, including exams by  gyn oncology every 6 months.  In early March 2015 she had acute onset of lower abdominal pain, then fever and nausea over next several days. She was treated by PCP for presumed diverticulitis with cipro and flagyl, initially with improvement then symptoms recurrent in late March. She had CT AP 05-23-13 showed a 4.1x4.1 cm mass left anterior upper pelvis, and CT biopsy 05-31-13 of mesenteric mass at level of umbilicus showed metastatic adenocarcinoma consistent with endometrioid carcinoma. Patient saw Dr Skeet Latch at [facility] on 06-08-13, with exam remarkable for palpable right pelvic mass and recommendation for 3 cycles of taxane / platin chemotherapy, then repeat imaging; may also consider radiation after chemotherapy. The initial abdominal pain resolved entirely. Cycle 1 taxol carboplatin was given 06-26-2013; she was neutropenic with ANC 0 on day 14. Repeat CT AP after cycle 3 had partial response of dominant ventral peritoneal mass, right pelvic sidewall mass and implant along sigmoid colon. She completed additional 3 cycles of carboplatin and taxol for total 6 cycles on 10-18-13, with neulasta support. Restaging CT showed partial response but still residual involvement including intraperitoneal disease, with additional 3 cycles carbo taxol recommended. She had cycle 1 day 1 dose dense carbo taxol on 11-26-13, with carbo skin test.  Review of systems as above, also: No fever or symptoms of infection. No bleeding. No abdominal or pelvic pain. Bowels and bladder ok.  Remainder of 10 point Review of Systems negative.  Objective:  Vital signs in last 24 hours:  BP 154/73 mmHg  Pulse 101  Temp(Src) 98.7 F (37.1 C) (Oral)  Resp 20  Ht 5\' 7"  (1.702 m)  Wt 210  lb 14.4 oz (95.664 kg)  BMI 33.02 kg/m2 weight is up 2 lbs.  Alert, oriented and appropriate. Ambulatory without difficulty.  Alopecia  HEENT:PERRL, sclerae not icteric. Oral mucosa moist without lesions, posterior pharynx clear.  Neck supple. No JVD.   Lymphatics:no cervical,supraclavicular or inguinal adenopathy Resp: clear to auscultation bilaterally and normal percussion bilaterally Cardio: regular rate and rhythm. No gallop. GI: soft, nontender, not distended, no mass or organomegaly. Normally active bowel sounds. Surgical incision not remarkable. Musculoskeletal/ Extremities: without pitting edema, cords, tenderness. Mild kyphosis unchanged. Neuro: no significant peripheral neuropathy. Otherwise nonfocal. PSYCH  Appropriate mood and affect                  Skin without rash, ecchymosis, petechiae  Lab Results:  Results for orders placed or performed in visit on 12/31/13  CBC with Differential  Result Value Ref Range   WBC 6.1 3.9 - 10.3 10e3/uL   NEUT# 5.2 1.5 - 6.5 10e3/uL   HGB 12.2 11.6 - 15.9 g/dL   HCT 36.5 34.8 - 46.6 %   Platelets 158 145 - 400 10e3/uL   MCV 96.2 79.5 - 101.0 fL   MCH 32.0 25.1 - 34.0 pg   MCHC 33.3 31.5 - 36.0 g/dL   RBC 3.80 3.70 - 5.45 10e6/uL   RDW 15.4 (H) 11.2 - 14.5 %   lymph# 0.8 (L) 0.9 - 3.3 10e3/uL   MONO# 0.0 (L) 0.1 - 0.9 10e3/uL   Eosinophils Absolute 0.0 0.0 - 0.5 10e3/uL   Basophils Absolute 0.0 0.0 - 0.1 10e3/uL   NEUT% 85.2 (H) 38.4 - 76.8 %   LYMPH% 13.8 (L) 14.0 - 49.7 %   MONO% 0.7 0.0 - 14.0 %   EOS% 0.0 0.0 - 7.0 %   BASO% 0.3 0.0 - 2.0 %   CMET available after visit normal with exception of Na 134 and glucose (on steroids) 219.  Studies/Results:  No results found.  Medications: I have reviewed the patient's current medications. She does not need ativan for nausea, uses it very occasionally if unable to sleep.  DISCUSSION: discussed treatment dates around Thanksgiving, which patient does not want to move. Assessment/Plan:  1.Endometrial carcinoma initially IA grade 1 at surgery 11-2010, then multifocal recurrence in pelvis found April 2015. Partial response by CT to 6 cycles of q 3 week carboplatin and taxol, now continuing with dose dense regimen planned for total 3  additional cycles if tolerates. Day 8 cycle 2 today.  If she reacts to [place] skin test would consider single agent taxol (not discussed).  2.taxol related peripheral neuropathy: intermittent in feet and not worse since most recent treatment. Continue to massage and exercise feet regularly  3.HTN  4.fatty liver and obesity  5.diverticulosis: not symptomatic  6.hypokalemia improved: continue liquid K supplement + herbal preparation + diet   I will see her in 2-3 weeks. Chemo and granix orders confirmed.  Time spent 25 min including >50% counseling and coordination of care.   LIVESAY,LENNIS P, MD   12/31/2013, 1:17 PM

## 2014-01-01 ENCOUNTER — Ambulatory Visit (HOSPITAL_BASED_OUTPATIENT_CLINIC_OR_DEPARTMENT_OTHER): Payer: Medicare Other

## 2014-01-01 ENCOUNTER — Encounter: Payer: Self-pay | Admitting: Oncology

## 2014-01-01 DIAGNOSIS — Z5189 Encounter for other specified aftercare: Secondary | ICD-10-CM

## 2014-01-01 DIAGNOSIS — C7989 Secondary malignant neoplasm of other specified sites: Secondary | ICD-10-CM

## 2014-01-01 DIAGNOSIS — C541 Malignant neoplasm of endometrium: Secondary | ICD-10-CM

## 2014-01-01 MED ORDER — TBO-FILGRASTIM 300 MCG/0.5ML ~~LOC~~ SOSY
300.0000 ug | PREFILLED_SYRINGE | Freq: Once | SUBCUTANEOUS | Status: AC
  Administered 2014-01-01: 300 ug via SUBCUTANEOUS
  Filled 2014-01-01: qty 0.5

## 2014-01-01 NOTE — Patient Instructions (Signed)

## 2014-01-01 NOTE — Progress Notes (Signed)
Faxed cancer form to [employer] @ [phone] °

## 2014-01-07 ENCOUNTER — Ambulatory Visit (HOSPITAL_BASED_OUTPATIENT_CLINIC_OR_DEPARTMENT_OTHER): Payer: Medicare Other

## 2014-01-07 ENCOUNTER — Other Ambulatory Visit (HOSPITAL_BASED_OUTPATIENT_CLINIC_OR_DEPARTMENT_OTHER): Payer: Medicare Other

## 2014-01-07 ENCOUNTER — Other Ambulatory Visit: Payer: Self-pay | Admitting: *Deleted

## 2014-01-07 DIAGNOSIS — Z5111 Encounter for antineoplastic chemotherapy: Secondary | ICD-10-CM

## 2014-01-07 DIAGNOSIS — C549 Malignant neoplasm of corpus uteri, unspecified: Secondary | ICD-10-CM

## 2014-01-07 DIAGNOSIS — C541 Malignant neoplasm of endometrium: Secondary | ICD-10-CM

## 2014-01-07 LAB — CBC WITH DIFFERENTIAL/PLATELET
BASO%: 0.3 % (ref 0.0–2.0)
Basophils Absolute: 0 10*3/uL (ref 0.0–0.1)
EOS ABS: 0 10*3/uL (ref 0.0–0.5)
EOS%: 0 % (ref 0.0–7.0)
HCT: 33.8 % — ABNORMAL LOW (ref 34.8–46.6)
HGB: 11.3 g/dL — ABNORMAL LOW (ref 11.6–15.9)
LYMPH%: 25.2 % (ref 14.0–49.7)
MCH: 31.7 pg (ref 25.1–34.0)
MCHC: 33.4 g/dL (ref 31.5–36.0)
MCV: 94.7 fL (ref 79.5–101.0)
MONO#: 0.1 10*3/uL (ref 0.1–0.9)
MONO%: 2.9 % (ref 0.0–14.0)
NEUT#: 2.4 10*3/uL (ref 1.5–6.5)
NEUT%: 71.6 % (ref 38.4–76.8)
PLATELETS: 156 10*3/uL (ref 145–400)
RBC: 3.57 10*6/uL — ABNORMAL LOW (ref 3.70–5.45)
RDW: 15.3 % — ABNORMAL HIGH (ref 11.2–14.5)
WBC: 3.4 10*3/uL — ABNORMAL LOW (ref 3.9–10.3)
lymph#: 0.9 10*3/uL (ref 0.9–3.3)

## 2014-01-07 LAB — COMPREHENSIVE METABOLIC PANEL (CC13)
ALBUMIN: 3.7 g/dL (ref 3.5–5.0)
ALK PHOS: 59 U/L (ref 40–150)
ALT: 31 U/L (ref 0–55)
AST: 19 U/L (ref 5–34)
Anion Gap: 12 mEq/L — ABNORMAL HIGH (ref 3–11)
BUN: 13.7 mg/dL (ref 7.0–26.0)
CO2: 22 mEq/L (ref 22–29)
Calcium: 9.7 mg/dL (ref 8.4–10.4)
Chloride: 99 mEq/L (ref 98–109)
Creatinine: 0.8 mg/dL (ref 0.6–1.1)
Glucose: 183 mg/dl — ABNORMAL HIGH (ref 70–140)
POTASSIUM: 3.3 meq/L — AB (ref 3.5–5.1)
Sodium: 133 mEq/L — ABNORMAL LOW (ref 136–145)
Total Bilirubin: 0.44 mg/dL (ref 0.20–1.20)
Total Protein: 6.8 g/dL (ref 6.4–8.3)

## 2014-01-07 MED ORDER — PACLITAXEL CHEMO INJECTION 300 MG/50ML
80.0000 mg/m2 | Freq: Once | INTRAVENOUS | Status: AC
  Administered 2014-01-07: 168 mg via INTRAVENOUS
  Filled 2014-01-07: qty 28

## 2014-01-07 MED ORDER — FAMOTIDINE IN NACL 20-0.9 MG/50ML-% IV SOLN
20.0000 mg | Freq: Once | INTRAVENOUS | Status: AC
  Administered 2014-01-07: 20 mg via INTRAVENOUS

## 2014-01-07 MED ORDER — SODIUM CHLORIDE 0.9 % IV SOLN
Freq: Once | INTRAVENOUS | Status: AC
  Administered 2014-01-07: 10:00:00 via INTRAVENOUS

## 2014-01-07 MED ORDER — DEXAMETHASONE SODIUM PHOSPHATE 20 MG/5ML IJ SOLN
20.0000 mg | Freq: Once | INTRAMUSCULAR | Status: AC
Start: 2014-01-07 — End: 2014-01-07
  Administered 2014-01-07: 20 mg via INTRAVENOUS

## 2014-01-07 MED ORDER — POTASSIUM CHLORIDE 20 MEQ/15ML (10%) PO SOLN: 20.0000 meq | Freq: Three times a day (TID) | ORAL | Status: DC

## 2014-01-07 MED ORDER — DIPHENHYDRAMINE HCL 50 MG/ML IJ SOLN
50.0000 mg | Freq: Once | INTRAMUSCULAR | Status: AC
  Administered 2014-01-07: 50 mg via INTRAVENOUS

## 2014-01-07 MED ORDER — DEXAMETHASONE SODIUM PHOSPHATE 20 MG/5ML IJ SOLN
INTRAMUSCULAR | Status: AC
Start: 2014-01-07 — End: 2014-01-07
  Filled 2014-01-07: qty 5

## 2014-01-07 MED ORDER — ONDANSETRON 8 MG/NS 50 ML IVPB
INTRAVENOUS | Status: AC
  Filled 2014-01-07: qty 8

## 2014-01-07 MED ORDER — ONDANSETRON 8 MG/50ML IVPB (CHCC)
8.0000 mg | Freq: Once | INTRAVENOUS | Status: AC
  Administered 2014-01-07: 8 mg via INTRAVENOUS

## 2014-01-07 MED ORDER — FAMOTIDINE IN NACL 20-0.9 MG/50ML-% IV SOLN
INTRAVENOUS | Status: AC
  Filled 2014-01-07: qty 50

## 2014-01-07 MED ORDER — DIPHENHYDRAMINE HCL 50 MG/ML IJ SOLN
INTRAMUSCULAR | Status: AC
  Filled 2014-01-07: qty 1

## 2014-01-07 NOTE — Progress Notes (Signed)
K+ = 3.3, Na = 133.  Called Dr. Marko Plume.  Verbal order received and read back from Dr. Marko Plume for patient to increase potassium to three times daily and proceed with today's treatment.  Refill sent to pharmacy with new directions.

## 2014-01-07 NOTE — Patient Instructions (Signed)
[facility] Discharge Instructions for Patients Receiving Chemotherapy  Today you received the following chemotherapy agents Taxol.  To help prevent nausea and vomiting after your treatment, we encourage you to take your nausea medication lorazepam, zofran as ordered by Dr. Marko Plume.   If you develop nausea and vomiting that is not controlled by your nausea medication, call the clinic.   BELOW ARE SYMPTOMS THAT SHOULD BE REPORTED IMMEDIATELY:  *FEVER GREATER THAN 100.5 F  *CHILLS WITH OR WITHOUT FEVER  NAUSEA AND VOMITING THAT IS NOT CONTROLLED WITH YOUR NAUSEA MEDICATION  *UNUSUAL SHORTNESS OF BREATH  *UNUSUAL BRUISING OR BLEEDING  TENDERNESS IN MOUTH AND THROAT WITH OR WITHOUT PRESENCE OF ULCERS  *URINARY PROBLEMS  *BOWEL PROBLEMS  UNUSUAL RASH Items with * indicate a potential emergency and should be followed up as soon as possible.  Feel free to call the clinic you have any questions or concerns. The clinic phone number is [phone].

## 2014-01-07 NOTE — Progress Notes (Signed)
Discharged at 65, ambulatory with spouse  In no distress.

## 2014-01-08 ENCOUNTER — Ambulatory Visit (HOSPITAL_BASED_OUTPATIENT_CLINIC_OR_DEPARTMENT_OTHER): Payer: Medicare Other

## 2014-01-08 DIAGNOSIS — C541 Malignant neoplasm of endometrium: Secondary | ICD-10-CM

## 2014-01-08 DIAGNOSIS — Z5189 Encounter for other specified aftercare: Secondary | ICD-10-CM | POA: Diagnosis not present

## 2014-01-08 DIAGNOSIS — C7989 Secondary malignant neoplasm of other specified sites: Secondary | ICD-10-CM

## 2014-01-08 MED ORDER — TBO-FILGRASTIM 300 MCG/0.5ML ~~LOC~~ SOSY
300.0000 ug | PREFILLED_SYRINGE | Freq: Once | SUBCUTANEOUS | Status: AC
  Administered 2014-01-08: 300 ug via SUBCUTANEOUS
  Filled 2014-01-08: qty 0.5

## 2014-01-14 ENCOUNTER — Other Ambulatory Visit: Payer: Self-pay | Admitting: Oncology

## 2014-01-14 ENCOUNTER — Other Ambulatory Visit (HOSPITAL_BASED_OUTPATIENT_CLINIC_OR_DEPARTMENT_OTHER): Payer: Medicare Other

## 2014-01-14 ENCOUNTER — Ambulatory Visit (HOSPITAL_BASED_OUTPATIENT_CLINIC_OR_DEPARTMENT_OTHER): Payer: Medicare Other

## 2014-01-14 DIAGNOSIS — C541 Malignant neoplasm of endometrium: Secondary | ICD-10-CM

## 2014-01-14 DIAGNOSIS — Z5111 Encounter for antineoplastic chemotherapy: Secondary | ICD-10-CM

## 2014-01-14 DIAGNOSIS — C549 Malignant neoplasm of corpus uteri, unspecified: Secondary | ICD-10-CM

## 2014-01-14 LAB — CBC WITH DIFFERENTIAL/PLATELET
BASO%: 0.2 % (ref 0.0–2.0)
BASOS ABS: 0 10*3/uL (ref 0.0–0.1)
EOS ABS: 0 10*3/uL (ref 0.0–0.5)
EOS%: 0 % (ref 0.0–7.0)
HEMATOCRIT: 33.5 % — AB (ref 34.8–46.6)
HEMOGLOBIN: 11.3 g/dL — AB (ref 11.6–15.9)
LYMPH#: 1.4 10*3/uL (ref 0.9–3.3)
LYMPH%: 22.7 % (ref 14.0–49.7)
MCH: 32.3 pg (ref 25.1–34.0)
MCHC: 33.7 g/dL (ref 31.5–36.0)
MCV: 95.7 fL (ref 79.5–101.0)
MONO#: 0.2 10*3/uL (ref 0.1–0.9)
MONO%: 2.9 % (ref 0.0–14.0)
NEUT#: 4.6 10*3/uL (ref 1.5–6.5)
NEUT%: 74.2 % (ref 38.4–76.8)
Platelets: 185 10*3/uL (ref 145–400)
RBC: 3.5 10*6/uL — ABNORMAL LOW (ref 3.70–5.45)
RDW: 16.5 % — ABNORMAL HIGH (ref 11.2–14.5)
WBC: 6.2 10*3/uL (ref 3.9–10.3)

## 2014-01-14 LAB — COMPREHENSIVE METABOLIC PANEL (CC13)
ALBUMIN: 3.8 g/dL (ref 3.5–5.0)
ALT: 27 U/L (ref 0–55)
ANION GAP: 13 meq/L — AB (ref 3–11)
AST: 21 U/L (ref 5–34)
Alkaline Phosphatase: 64 U/L (ref 40–150)
BUN: 11.9 mg/dL (ref 7.0–26.0)
CALCIUM: 9.8 mg/dL (ref 8.4–10.4)
CHLORIDE: 100 meq/L (ref 98–109)
CO2: 24 mEq/L (ref 22–29)
Creatinine: 0.9 mg/dL (ref 0.6–1.1)
GLUCOSE: 141 mg/dL — AB (ref 70–140)
Potassium: 4.2 mEq/L (ref 3.5–5.1)
Sodium: 137 mEq/L (ref 136–145)
Total Bilirubin: 0.39 mg/dL (ref 0.20–1.20)
Total Protein: 6.8 g/dL (ref 6.4–8.3)

## 2014-01-14 MED ORDER — SODIUM CHLORIDE 0.9 % IV SOLN
534.5000 mg | Freq: Once | INTRAVENOUS | Status: AC
  Administered 2014-01-14: 530 mg via INTRAVENOUS
  Filled 2014-01-14: qty 53

## 2014-01-14 MED ORDER — DIPHENHYDRAMINE HCL 50 MG/ML IJ SOLN
50.0000 mg | Freq: Once | INTRAMUSCULAR | Status: AC
  Administered 2014-01-14: 50 mg via INTRAVENOUS

## 2014-01-14 MED ORDER — PACLITAXEL CHEMO INJECTION 300 MG/50ML
80.0000 mg/m2 | Freq: Once | INTRAVENOUS | Status: AC
  Administered 2014-01-14: 168 mg via INTRAVENOUS
  Filled 2014-01-14: qty 28

## 2014-01-14 MED ORDER — DEXAMETHASONE SODIUM PHOSPHATE 20 MG/5ML IJ SOLN
20.0000 mg | Freq: Once | INTRAMUSCULAR | Status: AC
  Administered 2014-01-14: 20 mg via INTRAVENOUS

## 2014-01-14 MED ORDER — ONDANSETRON 16 MG/50ML IVPB (CHCC)
16.0000 mg | Freq: Once | INTRAVENOUS | Status: AC
  Administered 2014-01-14: 16 mg via INTRAVENOUS

## 2014-01-14 MED ORDER — CARBOPLATIN CHEMO INTRADERMAL TEST DOSE 100MCG/0.02ML
100.0000 ug | Freq: Once | INTRADERMAL | Status: AC
  Administered 2014-01-14: 100 ug via INTRADERMAL
  Filled 2014-01-14: qty 0.01

## 2014-01-14 MED ORDER — FAMOTIDINE IN NACL 20-0.9 MG/50ML-% IV SOLN
INTRAVENOUS | Status: AC
Start: 2014-01-14 — End: 2014-01-14
  Filled 2014-01-14: qty 50

## 2014-01-14 MED ORDER — SODIUM CHLORIDE 0.9 % IV SOLN
Freq: Once | INTRAVENOUS | Status: AC
  Administered 2014-01-14: 13:00:00 via INTRAVENOUS

## 2014-01-14 MED ORDER — DEXAMETHASONE SODIUM PHOSPHATE 20 MG/5ML IJ SOLN
INTRAMUSCULAR | Status: AC
  Filled 2014-01-14: qty 5

## 2014-01-14 MED ORDER — DIPHENHYDRAMINE HCL 50 MG/ML IJ SOLN
INTRAMUSCULAR | Status: AC
  Filled 2014-01-14: qty 1

## 2014-01-14 MED ORDER — FAMOTIDINE IN NACL 20-0.9 MG/50ML-% IV SOLN
20.0000 mg | Freq: Once | INTRAVENOUS | Status: AC
  Administered 2014-01-14: 20 mg via INTRAVENOUS

## 2014-01-14 MED ORDER — ONDANSETRON 16 MG/50ML IVPB (CHCC)
INTRAVENOUS | Status: AC
  Filled 2014-01-14: qty 16

## 2014-01-14 NOTE — Patient Instructions (Signed)
[facility] Discharge Instructions for Patients Receiving Chemotherapy  Today you received the following chemotherapy agents Taxol and Carboplatin.  To help prevent nausea and vomiting after your treatment, we encourage you to take your nausea medication.   If you develop nausea and vomiting that is not controlled by your nausea medication, call the clinic.   BELOW ARE SYMPTOMS THAT SHOULD BE REPORTED IMMEDIATELY:  *FEVER GREATER THAN 100.5 F  *CHILLS WITH OR WITHOUT FEVER  NAUSEA AND VOMITING THAT IS NOT CONTROLLED WITH YOUR NAUSEA MEDICATION  *UNUSUAL SHORTNESS OF BREATH  *UNUSUAL BRUISING OR BLEEDING  TENDERNESS IN MOUTH AND THROAT WITH OR WITHOUT PRESENCE OF ULCERS  *URINARY PROBLEMS  *BOWEL PROBLEMS  UNUSUAL RASH Items with * indicate a potential emergency and should be followed up as soon as possible.  Feel free to call the clinic you have any questions or concerns. The clinic phone number is [phone].    

## 2014-01-15 ENCOUNTER — Ambulatory Visit (HOSPITAL_BASED_OUTPATIENT_CLINIC_OR_DEPARTMENT_OTHER): Payer: Medicare Other

## 2014-01-15 DIAGNOSIS — C541 Malignant neoplasm of endometrium: Secondary | ICD-10-CM

## 2014-01-15 DIAGNOSIS — Z5189 Encounter for other specified aftercare: Secondary | ICD-10-CM | POA: Diagnosis not present

## 2014-01-15 DIAGNOSIS — C7989 Secondary malignant neoplasm of other specified sites: Secondary | ICD-10-CM

## 2014-01-15 MED ORDER — TBO-FILGRASTIM 300 MCG/0.5ML ~~LOC~~ SOSY
300.0000 ug | PREFILLED_SYRINGE | Freq: Once | SUBCUTANEOUS | Status: AC
  Administered 2014-01-15: 300 ug via SUBCUTANEOUS
  Filled 2014-01-15: qty 0.5

## 2014-01-15 MED ORDER — FILGRASTIM 300 MCG/0.5ML IJ SOSY: 300.0000 ug | PREFILLED_SYRINGE | Freq: Once | INTRAMUSCULAR | Status: DC

## 2014-01-15 NOTE — Patient Instructions (Signed)

## 2014-01-18 ENCOUNTER — Other Ambulatory Visit: Payer: Self-pay | Admitting: Oncology

## 2014-01-18 ENCOUNTER — Other Ambulatory Visit: Payer: Self-pay | Admitting: *Deleted

## 2014-01-18 DIAGNOSIS — C541 Malignant neoplasm of endometrium: Secondary | ICD-10-CM

## 2014-01-21 ENCOUNTER — Ambulatory Visit (HOSPITAL_BASED_OUTPATIENT_CLINIC_OR_DEPARTMENT_OTHER): Payer: Medicare Other

## 2014-01-21 ENCOUNTER — Encounter: Payer: Self-pay | Admitting: Oncology

## 2014-01-21 ENCOUNTER — Other Ambulatory Visit (HOSPITAL_BASED_OUTPATIENT_CLINIC_OR_DEPARTMENT_OTHER): Payer: Medicare Other

## 2014-01-21 ENCOUNTER — Other Ambulatory Visit: Payer: Self-pay | Admitting: *Deleted

## 2014-01-21 ENCOUNTER — Ambulatory Visit (HOSPITAL_BASED_OUTPATIENT_CLINIC_OR_DEPARTMENT_OTHER): Payer: Medicare Other | Admitting: Oncology

## 2014-01-21 VITALS — BP 148/77 | HR 98 | Temp 98.5°F | Resp 18 | Ht 67.0 in | Wt 215.2 lb

## 2014-01-21 DIAGNOSIS — C541 Malignant neoplasm of endometrium: Secondary | ICD-10-CM

## 2014-01-21 DIAGNOSIS — T451X5A Adverse effect of antineoplastic and immunosuppressive drugs, initial encounter: Secondary | ICD-10-CM

## 2014-01-21 DIAGNOSIS — G620 Drug-induced polyneuropathy: Secondary | ICD-10-CM

## 2014-01-21 DIAGNOSIS — Z5111 Encounter for antineoplastic chemotherapy: Secondary | ICD-10-CM

## 2014-01-21 DIAGNOSIS — D6481 Anemia due to antineoplastic chemotherapy: Secondary | ICD-10-CM

## 2014-01-21 DIAGNOSIS — G622 Polyneuropathy due to other toxic agents: Secondary | ICD-10-CM

## 2014-01-21 LAB — CBC WITH DIFFERENTIAL/PLATELET
BASO%: 0.2 % (ref 0.0–2.0)
Basophils Absolute: 0 10*3/uL (ref 0.0–0.1)
EOS%: 0 % (ref 0.0–7.0)
Eosinophils Absolute: 0 10*3/uL (ref 0.0–0.5)
HEMATOCRIT: 32.3 % — AB (ref 34.8–46.6)
HGB: 10.9 g/dL — ABNORMAL LOW (ref 11.6–15.9)
LYMPH%: 20.8 % (ref 14.0–49.7)
MCH: 32.4 pg (ref 25.1–34.0)
MCHC: 33.7 g/dL (ref 31.5–36.0)
MCV: 96.1 fL (ref 79.5–101.0)
MONO#: 0.2 10*3/uL (ref 0.1–0.9)
MONO%: 3.4 % (ref 0.0–14.0)
NEUT#: 4.3 10*3/uL (ref 1.5–6.5)
NEUT%: 75.6 % (ref 38.4–76.8)
PLATELETS: 154 10*3/uL (ref 145–400)
RBC: 3.36 10*6/uL — ABNORMAL LOW (ref 3.70–5.45)
RDW: 17 % — ABNORMAL HIGH (ref 11.2–14.5)
WBC: 5.7 10*3/uL (ref 3.9–10.3)
lymph#: 1.2 10*3/uL (ref 0.9–3.3)

## 2014-01-21 LAB — COMPREHENSIVE METABOLIC PANEL (CC13)
ALK PHOS: 61 U/L (ref 40–150)
ALT: 34 U/L (ref 0–55)
ANION GAP: 14 meq/L — AB (ref 3–11)
AST: 26 U/L (ref 5–34)
Albumin: 3.8 g/dL (ref 3.5–5.0)
BILIRUBIN TOTAL: 0.4 mg/dL (ref 0.20–1.20)
BUN: 13.2 mg/dL (ref 7.0–26.0)
CO2: 23 meq/L (ref 22–29)
CREATININE: 0.8 mg/dL (ref 0.6–1.1)
Calcium: 9.8 mg/dL (ref 8.4–10.4)
Chloride: 98 mEq/L (ref 98–109)
GLUCOSE: 184 mg/dL — AB (ref 70–140)
Potassium: 3.9 mEq/L (ref 3.5–5.1)
Sodium: 135 mEq/L — ABNORMAL LOW (ref 136–145)
Total Protein: 6.8 g/dL (ref 6.4–8.3)

## 2014-01-21 MED ORDER — FAMOTIDINE IN NACL 20-0.9 MG/50ML-% IV SOLN
20.0000 mg | Freq: Once | INTRAVENOUS | Status: AC
  Administered 2014-01-21: 20 mg via INTRAVENOUS

## 2014-01-21 MED ORDER — DIPHENHYDRAMINE HCL 50 MG/ML IJ SOLN
INTRAMUSCULAR | Status: AC
  Filled 2014-01-21: qty 1

## 2014-01-21 MED ORDER — SODIUM CHLORIDE 0.9 % IV SOLN
Freq: Once | INTRAVENOUS | Status: AC
  Administered 2014-01-21: 14:00:00 via INTRAVENOUS

## 2014-01-21 MED ORDER — DEXAMETHASONE SODIUM PHOSPHATE 20 MG/5ML IJ SOLN
INTRAMUSCULAR | Status: AC
  Filled 2014-01-21: qty 5

## 2014-01-21 MED ORDER — ONDANSETRON 8 MG/NS 50 ML IVPB
INTRAVENOUS | Status: AC
  Filled 2014-01-21: qty 8

## 2014-01-21 MED ORDER — DIPHENHYDRAMINE HCL 50 MG/ML IJ SOLN
50.0000 mg | Freq: Once | INTRAMUSCULAR | Status: AC
  Administered 2014-01-21: 50 mg via INTRAVENOUS

## 2014-01-21 MED ORDER — PACLITAXEL CHEMO INJECTION 300 MG/50ML
80.0000 mg/m2 | Freq: Once | INTRAVENOUS | Status: AC
  Administered 2014-01-21: 168 mg via INTRAVENOUS
  Filled 2014-01-21: qty 28

## 2014-01-21 MED ORDER — FAMOTIDINE IN NACL 20-0.9 MG/50ML-% IV SOLN
INTRAVENOUS | Status: AC
  Filled 2014-01-21: qty 50

## 2014-01-21 MED ORDER — DEXAMETHASONE SODIUM PHOSPHATE 20 MG/5ML IJ SOLN
20.0000 mg | Freq: Once | INTRAMUSCULAR | Status: AC
  Administered 2014-01-21: 20 mg via INTRAVENOUS

## 2014-01-21 MED ORDER — DEXAMETHASONE 4 MG PO TABS: ORAL_TABLET | ORAL | Status: DC

## 2014-01-21 MED ORDER — ONDANSETRON 8 MG/50ML IVPB (CHCC)
8.0000 mg | Freq: Once | INTRAVENOUS | Status: AC
Start: 2014-01-21 — End: 2014-01-21
  Administered 2014-01-21: 8 mg via INTRAVENOUS

## 2014-01-21 NOTE — Patient Instructions (Signed)
[facility] Discharge Instructions for Patients Receiving Chemotherapy  Today you received the following chemotherapy agents: Taxol.  To help prevent nausea and vomiting after your treatment, we encourage you to take your nausea medication: Zofran 8 mg every 12 hours as needed.   If you develop nausea and vomiting that is not controlled by your nausea medication, call the clinic.   BELOW ARE SYMPTOMS THAT SHOULD BE REPORTED IMMEDIATELY:  *FEVER GREATER THAN 100.5 F  *CHILLS WITH OR WITHOUT FEVER  NAUSEA AND VOMITING THAT IS NOT CONTROLLED WITH YOUR NAUSEA MEDICATION  *UNUSUAL SHORTNESS OF BREATH  *UNUSUAL BRUISING OR BLEEDING  TENDERNESS IN MOUTH AND THROAT WITH OR WITHOUT PRESENCE OF ULCERS  *URINARY PROBLEMS  *BOWEL PROBLEMS  UNUSUAL RASH Items with * indicate a potential emergency and should be followed up as soon as possible.  Feel free to call the clinic you have any questions or concerns. The clinic phone number is [phone].

## 2014-01-21 NOTE — Progress Notes (Signed)
OFFICE PROGRESS NOTE   01/21/2014   Physicians:B[place]ster, Canary Brim, Lorri Frederick, Louie Casa ([facility])  INTERVAL HISTORY:  Patient is seen, together with husband, in continuing attention to chemotherapy for recurrent endometrial carcinoma. She will complete additional 3 cycles dose dense carbo taxol with day 15 on 01-28-14, for total of 9 cycles. She will have repeat CT on 02-08-14 and see Dr Skeet Latch on 02-11-14. Note CT after total 6 cycles had residual intraperitoneal disease. She has tolerated this dose dense regimen much better than initial q 3 week carbo taxol, and is due day 8 taxol today.  Patient was more active thru Thanksgiving, then more fatigued past several days, but is feeling better today. She notices SOB with exertion such as climbing stairs, no SOB with more limited activity. She still walks her dogs regularly. She has no peripheral neuropathy in hands, with neuropathy in toes intermittently noticeable but not worse. Bowels are moving and she is eating. She had no problems with most recent [place] skin test. She denies any abdominal or pelvic discomfort.  She does not have PAC. She had flu vaccine  ONCOLOGIC HISTORY Patient presented with vaginal spotting 07-2010. Endometrial biopsy by Dr Clovia Cuff 10-21-10 revealed endometroid carcinoma grade 1 focally involving polyp. She had robotic assisted total laparoscopic hysterectomy BSO and 20 pelvic node evaluation by Dr Skeet Latch on 11-24-2010. Pathology [phone]) had endometrial adenocarcinoma Grade I with focal squamous differentiation, superficial myometrial invasion ( 0.2 cm where myometrium 1.4 cm); benign cervix, tubes and ovaries; no LVSI. With no high risk features, she was followed with observation only, including exams by gyn oncology every 6 months.  In early March 2015 she had acute onset of lower abdominal pain, then fever and nausea over next several days. She was treated by PCP for presumed diverticulitis with  cipro and flagyl, initially with improvement then symptoms recurrent in late March. She had CT AP 05-23-13 showed a 4.1x4.1 cm mass left anterior upper pelvis, and CT biopsy 05-31-13 of mesenteric mass at level of umbilicus showed metastatic adenocarcinoma consistent with endometrioid carcinoma. Patient saw Dr Skeet Latch at [facility] on 06-08-13, with exam remarkable for palpable right pelvic mass and recommendation for 3 cycles of taxane / platin chemotherapy, then repeat imaging; may also consider radiation after chemotherapy. The initial abdominal pain resolved entirely. Cycle 1 taxol carboplatin was given 06-26-2013; she was neutropenic with ANC 0 on day 14. Repeat CT AP after cycle 3 had partial response of dominant ventral peritoneal mass, right pelvic sidewall mass and implant along sigmoid colon. She completed additional 3 cycles of carboplatin and taxol for total 6 cycles on 10-18-13, with neulasta support. Restaging CT showed partial response but still residual involvement including intraperitoneal disease, with additional 3 cycles carbo taxol recommended. She had cycle 1 day 1 dose dense carbo taxol on 11-26-13, with carbo skin test.  Review of systems as above, also: Underarms irritated this week, has been using baking soda + essential oils + corn starch instead of deodorant x 6+ weeks. No LE swelling, no bleeding, no fever or symptoms of infection. Remainder of 10 point Review of Systems negative.  Objective:  Vital signs in last 24 hours: Weight 215, 148/77, 98 regular, 18 not labored RA in exam room, 98.5, BMI 33.8  Alert, oriented and appropriate. Ambulatory without difficulty.  Alopecia. Somewhat pale, not icteric. Pleasant and talkative, NAD  HEENT:PERRL, sclerae not icteric. Oral mucosa moist without lesions, posterior pharynx clear.  Neck supple. No JVD.  Lymphatics:no cervical,supraclavicular, axillary or  inguinal adenopathy Resp: clear to auscultation bilaterally and normal percussion  bilaterally Cardio: regular rate and rhythm. No gallop. GI: soft, nontender, not distended, no mass or organomegaly. Normally active bowel sounds. Surgical incision not remarkable. Musculoskeletal/ Extremities: without pitting edema, cords, tenderness Neuro: no significant peripheral neuropathy. Otherwise nonfocal. PSYCH mood and affect appropriate Skin without rash, ecchymosis, petechiae Breasts: without dominant mass, skin or nipple findings. Axillae with symmetrical erythema, no blisters, no hair.   Lab Results:  Results for orders placed or performed in visit on 01/21/14  CBC with Differential  Result Value Ref Range   WBC 5.7 3.9 - 10.3 10e3/uL   NEUT# 4.3 1.5 - 6.5 10e3/uL   HGB 10.9 (L) 11.6 - 15.9 g/dL   HCT 32.3 (L) 34.8 - 46.6 %   Platelets 154 145 - 400 10e3/uL   MCV 96.1 79.5 - 101.0 fL   MCH 32.4 25.1 - 34.0 pg   MCHC 33.7 31.5 - 36.0 g/dL   RBC 3.36 (L) 3.70 - 5.45 10e6/uL   RDW 17.0 (H) 11.2 - 14.5 %   lymph# 1.2 0.9 - 3.3 10e3/uL   MONO# 0.2 0.1 - 0.9 10e3/uL   Eosinophils Absolute 0.0 0.0 - 0.5 10e3/uL   Basophils Absolute 0.0 0.0 - 0.1 10e3/uL   NEUT% 75.6 38.4 - 76.8 %   LYMPH% 20.8 14.0 - 49.7 %   MONO% 3.4 0.0 - 14.0 %   EOS% 0.0 0.0 - 7.0 %   BASO% 0.2 0.0 - 2.0 %    CMET normal with exception of Na 135, glucose on steroids 184, including creat 0.8 and normal LFTs  Studies/Results:  No results found. CT AP pending 02-08-14  Medications: I have reviewed the patient's current medications. Decadron script to complete planned chemo (20 mg 12 hrs prior to low dose taxol)  DISCUSSION: I will set up my next appointment after scans/ after she sees Dr Skeet Latch depending on those results and recommendations.   Assessment/Plan: 1.Endometrial carcinoma initially IA grade 1 at surgery 11-2010, then multifocal recurrence in pelvis found April 2015. Partial response by CT after 6 cycles of q 3 week carboplatin and taxol, now continuing with dose dense regimen,  which has been much easier for her to tolerate,  total 3 additional cycles to complete 01-28-14. Repeat CT and gyn oncology evaluation next. 2.taxol related peripheral neuropathy: intermittent in feet and not worse since most recent treatment. Continue to massage and exercise feet regularly  3.HTN  4.fatty liver and obesity  5.diverticulosis: not symptomatic  6.hypokalemia improved: continue liquid K supplement + herbal preparation + diet 7.skin irritation in axillae likely from "all natural" deodorant 8.mild chemo anemia: follow  Chemo orders and granix day after each treatment confirmed. Patient knows that she can call at any time if needed; she will call if she has not heard from this office re next appointment within a few days after gyn oncology viisit. Time spent 25 min including >50% counseling and coordination of care.   Audine Mangione P, MD   01/21/2014, 12:31 PM

## 2014-01-22 ENCOUNTER — Ambulatory Visit (HOSPITAL_BASED_OUTPATIENT_CLINIC_OR_DEPARTMENT_OTHER): Payer: Medicare Other

## 2014-01-22 DIAGNOSIS — C7989 Secondary malignant neoplasm of other specified sites: Secondary | ICD-10-CM

## 2014-01-22 DIAGNOSIS — Z5189 Encounter for other specified aftercare: Secondary | ICD-10-CM

## 2014-01-22 DIAGNOSIS — C541 Malignant neoplasm of endometrium: Secondary | ICD-10-CM

## 2014-01-22 MED ORDER — TBO-FILGRASTIM 300 MCG/0.5ML ~~LOC~~ SOSY
300.0000 ug | PREFILLED_SYRINGE | Freq: Once | SUBCUTANEOUS | Status: AC
  Administered 2014-01-22: 300 ug via SUBCUTANEOUS
  Filled 2014-01-22: qty 0.5

## 2014-01-22 NOTE — Patient Instructions (Signed)

## 2014-01-23 ENCOUNTER — Encounter: Payer: Self-pay | Admitting: Oncology

## 2014-01-23 DIAGNOSIS — G620 Drug-induced polyneuropathy: Secondary | ICD-10-CM | POA: Insufficient documentation

## 2014-01-23 DIAGNOSIS — D6481 Anemia due to antineoplastic chemotherapy: Secondary | ICD-10-CM

## 2014-01-23 DIAGNOSIS — T451X5A Adverse effect of antineoplastic and immunosuppressive drugs, initial encounter: Secondary | ICD-10-CM | POA: Insufficient documentation

## 2014-01-23 HISTORY — DX: Adverse effect of antineoplastic and immunosuppressive drugs, initial encounter: T45.1X5A

## 2014-01-23 HISTORY — DX: Adverse effect of antineoplastic and immunosuppressive drugs, initial encounter: D64.81

## 2014-01-24 ENCOUNTER — Emergency Department (HOSPITAL_COMMUNITY): Payer: Medicare Other

## 2014-01-24 ENCOUNTER — Encounter (HOSPITAL_COMMUNITY): Payer: Self-pay | Admitting: Emergency Medicine

## 2014-01-24 ENCOUNTER — Inpatient Hospital Stay (HOSPITAL_COMMUNITY)
Admission: EM | Admit: 2014-01-24 | Discharge: 2014-01-26 | DRG: 872 | Disposition: A | Discharge status: Home / Self Care | Payer: Medicare Other | Attending: Internal Medicine | Admitting: Internal Medicine

## 2014-01-24 ENCOUNTER — Other Ambulatory Visit: Payer: Self-pay

## 2014-01-24 DIAGNOSIS — C541 Malignant neoplasm of endometrium: Secondary | ICD-10-CM | POA: Diagnosis present

## 2014-01-24 DIAGNOSIS — E872 Acidosis: Secondary | ICD-10-CM | POA: Diagnosis present

## 2014-01-24 DIAGNOSIS — Z91013 Allergy to seafood: Secondary | ICD-10-CM | POA: Diagnosis not present

## 2014-01-24 DIAGNOSIS — G629 Polyneuropathy, unspecified: Secondary | ICD-10-CM | POA: Diagnosis present

## 2014-01-24 DIAGNOSIS — E785 Hyperlipidemia, unspecified: Secondary | ICD-10-CM | POA: Diagnosis present

## 2014-01-24 DIAGNOSIS — R509 Fever, unspecified: Secondary | ICD-10-CM

## 2014-01-24 DIAGNOSIS — D6959 Other secondary thrombocytopenia: Secondary | ICD-10-CM | POA: Diagnosis present

## 2014-01-24 DIAGNOSIS — Z888 Allergy status to other drugs, medicaments and biological substances status: Secondary | ICD-10-CM | POA: Diagnosis not present

## 2014-01-24 DIAGNOSIS — T502X5A Adverse effect of carbonic-anhydrase inhibitors, benzothiadiazides and other diuretics, initial encounter: Secondary | ICD-10-CM | POA: Diagnosis present

## 2014-01-24 DIAGNOSIS — T451X5A Adverse effect of antineoplastic and immunosuppressive drugs, initial encounter: Secondary | ICD-10-CM | POA: Diagnosis present

## 2014-01-24 DIAGNOSIS — K760 Fatty (change of) liver, not elsewhere classified: Secondary | ICD-10-CM | POA: Diagnosis present

## 2014-01-24 DIAGNOSIS — I10 Essential (primary) hypertension: Secondary | ICD-10-CM

## 2014-01-24 DIAGNOSIS — D6481 Anemia due to antineoplastic chemotherapy: Secondary | ICD-10-CM | POA: Diagnosis present

## 2014-01-24 DIAGNOSIS — C786 Secondary malignant neoplasm of retroperitoneum and peritoneum: Secondary | ICD-10-CM

## 2014-01-24 DIAGNOSIS — E860 Dehydration: Secondary | ICD-10-CM | POA: Diagnosis present

## 2014-01-24 DIAGNOSIS — G620 Drug-induced polyneuropathy: Secondary | ICD-10-CM

## 2014-01-24 DIAGNOSIS — A419 Sepsis, unspecified organism: Secondary | ICD-10-CM

## 2014-01-24 DIAGNOSIS — K5790 Diverticulosis of intestine, part unspecified, without perforation or abscess without bleeding: Secondary | ICD-10-CM | POA: Diagnosis present

## 2014-01-24 DIAGNOSIS — R6510 Systemic inflammatory response syndrome (SIRS) of non-infectious origin without acute organ dysfunction: Secondary | ICD-10-CM | POA: Diagnosis present

## 2014-01-24 DIAGNOSIS — K5792 Diverticulitis of intestine, part unspecified, without perforation or abscess without bleeding: Secondary | ICD-10-CM | POA: Diagnosis present

## 2014-01-24 DIAGNOSIS — E876 Hypokalemia: Secondary | ICD-10-CM | POA: Diagnosis present

## 2014-01-24 DIAGNOSIS — M5134 Other intervertebral disc degeneration, thoracic region: Secondary | ICD-10-CM | POA: Diagnosis present

## 2014-01-24 LAB — COMPREHENSIVE METABOLIC PANEL
ALBUMIN: 3.5 g/dL (ref 3.5–5.2)
ALT: 56 U/L — ABNORMAL HIGH (ref 0–35)
AST: 67 U/L — AB (ref 0–37)
Alkaline Phosphatase: 60 U/L (ref 39–117)
Anion gap: 18 — ABNORMAL HIGH (ref 5–15)
BUN: 16 mg/dL (ref 6–23)
CALCIUM: 9.3 mg/dL (ref 8.4–10.5)
CO2: 26 mEq/L (ref 19–32)
Chloride: 95 mEq/L — ABNORMAL LOW (ref 96–112)
Creatinine, Ser: 0.66 mg/dL (ref 0.50–1.10)
GFR calc Af Amer: >90 mL/min (ref >90)
GFR calc non Af Amer: >90 mL/min (ref >90)
Glucose, Bld: 91 mg/dL (ref 70–99)
Potassium: 3.3 mEq/L — ABNORMAL LOW (ref 3.7–5.3)
SODIUM: 139 meq/L (ref 137–147)
Total Bilirubin: 0.7 mg/dL (ref 0.3–1.2)
Total Protein: 6.3 g/dL (ref 6.0–8.3)

## 2014-01-24 LAB — CBC WITH DIFFERENTIAL/PLATELET
Basophils Absolute: 0 10*3/uL (ref 0.0–0.1)
Basophils Relative: 0 % (ref 0–1)
Eosinophils Absolute: 0 10*3/uL (ref 0.0–0.7)
Eosinophils Relative: 0 % (ref 0–5)
HCT: 28.8 % — ABNORMAL LOW (ref 36.0–46.0)
Hemoglobin: 9.7 g/dL — ABNORMAL LOW (ref 12.0–15.0)
Lymphocytes Relative: 12 % (ref 12–46)
Lymphs Abs: 0.6 10*3/uL — ABNORMAL LOW (ref 0.7–4.0)
MCH: 32.8 pg (ref 26.0–34.0)
MCHC: 33.7 g/dL (ref 30.0–36.0)
MCV: 97.3 fL (ref 78.0–100.0)
MONO ABS: 0.2 10*3/uL (ref 0.1–1.0)
MONOS PCT: 4 % (ref 3–12)
NEUTROS PCT: 84 % — AB (ref 43–77)
Neutro Abs: 4.3 10*3/uL (ref 1.7–7.7)
PLATELETS: 115 10*3/uL — AB (ref 150–400)
RBC: 2.96 MIL/uL — AB (ref 3.87–5.11)
RDW: 18.2 % — ABNORMAL HIGH (ref 11.5–15.5)
WBC: 5.1 10*3/uL (ref 4.0–10.5)

## 2014-01-24 LAB — I-STAT TROPONIN, ED: Troponin i, poc: 0.01 ng/mL (ref 0.00–0.08)

## 2014-01-24 LAB — MRSA PCR SCREENING: MRSA BY PCR: NEGATIVE

## 2014-01-24 LAB — URINALYSIS, ROUTINE W REFLEX MICROSCOPIC
Bilirubin Urine: NEGATIVE
Glucose, UA: NEGATIVE mg/dL
Hgb urine dipstick: NEGATIVE
Ketones, ur: NEGATIVE mg/dL
NITRITE: NEGATIVE
PH: 8.5 — AB (ref 5.0–8.0)
Protein, ur: NEGATIVE mg/dL
SPECIFIC GRAVITY, URINE: 1.014 (ref 1.005–1.030)
Urobilinogen, UA: 0.2 mg/dL (ref 0.0–1.0)

## 2014-01-24 LAB — URINE MICROSCOPIC-ADD ON

## 2014-01-24 LAB — INFLUENZA PANEL BY PCR (TYPE A & B)
H1N1 flu by pcr: NOT DETECTED
Influenza A By PCR: NEGATIVE
Influenza B By PCR: NEGATIVE

## 2014-01-24 LAB — PROCALCITONIN: Procalcitonin: 5.45 ng/mL

## 2014-01-24 LAB — I-STAT CG4 LACTIC ACID, ED
LACTIC ACID, VENOUS: 3.49 mmol/L — AB (ref 0.5–2.2)
Lactic Acid, Venous: 3.75 mmol/L — ABNORMAL HIGH (ref 0.5–2.2)

## 2014-01-24 MED ORDER — SODIUM CHLORIDE 0.9 % IV SOLN
INTRAVENOUS | Status: AC
  Administered 2014-01-24: 16:00:00 via INTRAVENOUS

## 2014-01-24 MED ORDER — PIPERACILLIN-TAZOBACTAM 3.375 G IVPB 30 MIN
3.3750 g | Freq: Once | INTRAVENOUS | Status: AC
  Administered 2014-01-24: 3.375 g via INTRAVENOUS
  Filled 2014-01-24: qty 50

## 2014-01-24 MED ORDER — POTASSIUM CHLORIDE CRYS ER 20 MEQ PO TBCR
40.0000 meq | EXTENDED_RELEASE_TABLET | ORAL | Status: DC
  Filled 2014-01-24: qty 2

## 2014-01-24 MED ORDER — PROBIOTIC DAILY PO CAPS: 1.0000 | ORAL_CAPSULE | Freq: Every day | ORAL | Status: DC

## 2014-01-24 MED ORDER — SODIUM CHLORIDE 0.9 % IV BOLUS (SEPSIS)
1000.0000 mL | INTRAVENOUS | Status: AC
  Administered 2014-01-24 (×3): 1000 mL via INTRAVENOUS

## 2014-01-24 MED ORDER — PIPERACILLIN-TAZOBACTAM 3.375 G IVPB
3.3750 g | Freq: Three times a day (TID) | INTRAVENOUS | Status: DC
  Administered 2014-01-24 – 2014-01-26 (×6): 3.375 g via INTRAVENOUS
  Filled 2014-01-24 (×3): qty 50

## 2014-01-24 MED ORDER — B COMPLEX-C PO TABS
1.0000 | ORAL_TABLET | Freq: Every day | ORAL | Status: DC
  Administered 2014-01-24 – 2014-01-26 (×3): 1 via ORAL
  Filled 2014-01-24 (×3): qty 1

## 2014-01-24 MED ORDER — SODIUM CHLORIDE 0.9 % IV SOLN
Freq: Once | INTRAVENOUS | Status: AC
  Administered 2014-01-24: 10:00:00 via INTRAVENOUS

## 2014-01-24 MED ORDER — ONDANSETRON HCL 4 MG PO TABS: 4.0000 mg | ORAL_TABLET | Freq: Four times a day (QID) | ORAL | Status: DC | PRN

## 2014-01-24 MED ORDER — POTASSIUM CHLORIDE 20 MEQ/15ML (10%) PO SOLN
20.0000 meq | Freq: Three times a day (TID) | ORAL | Status: DC
  Administered 2014-01-24 – 2014-01-25 (×2): 20 meq via ORAL
  Filled 2014-01-24 (×3): qty 15

## 2014-01-24 MED ORDER — VITAMIN C 500 MG PO TABS
1000.0000 mg | ORAL_TABLET | Freq: Every day | ORAL | Status: DC
  Administered 2014-01-24 – 2014-01-26 (×3): 1000 mg via ORAL
  Filled 2014-01-24 (×3): qty 2

## 2014-01-24 MED ORDER — POTASSIUM CHLORIDE IN NACL 40-0.9 MEQ/L-% IV SOLN
INTRAVENOUS | Status: DC
  Administered 2014-01-24: 100 mL/h via INTRAVENOUS
  Filled 2014-01-24 (×3): qty 1000

## 2014-01-24 MED ORDER — IBUPROFEN 200 MG PO TABS
400.0000 mg | ORAL_TABLET | Freq: Once | ORAL | Status: AC
  Administered 2014-01-24: 400 mg via ORAL
  Filled 2014-01-24: qty 2

## 2014-01-24 MED ORDER — ACETAMINOPHEN 325 MG PO TABS: 650.0000 mg | ORAL_TABLET | Freq: Four times a day (QID) | ORAL | Status: DC | PRN

## 2014-01-24 MED ORDER — VANCOMYCIN HCL IN DEXTROSE 1-5 GM/200ML-% IV SOLN
1000.0000 mg | Freq: Two times a day (BID) | INTRAVENOUS | Status: DC
  Administered 2014-01-24 – 2014-01-26 (×4): 1000 mg via INTRAVENOUS
  Filled 2014-01-24 (×3): qty 200

## 2014-01-24 MED ORDER — VANCOMYCIN HCL IN DEXTROSE 1-5 GM/200ML-% IV SOLN
1000.0000 mg | Freq: Once | INTRAVENOUS | Status: AC
  Administered 2014-01-24: 1000 mg via INTRAVENOUS
  Filled 2014-01-24: qty 200

## 2014-01-24 MED ORDER — BACITRACIN ZINC 500 UNIT/GM EX OINT
TOPICAL_OINTMENT | CUTANEOUS | Status: AC
  Filled 2014-01-24: qty 1.8

## 2014-01-24 MED ORDER — VANCOMYCIN HCL IN DEXTROSE 1-5 GM/200ML-% IV SOLN
1000.0000 mg | Freq: Once | INTRAVENOUS | Status: AC
Start: 2014-01-24 — End: 2014-01-24
  Administered 2014-01-24: 1000 mg via INTRAVENOUS
  Filled 2014-01-24: qty 200

## 2014-01-24 MED ORDER — UBIQUINOL 100 MG PO CAPS: 100.0000 mg | ORAL_CAPSULE | Freq: Every day | ORAL | Status: DC

## 2014-01-24 MED ORDER — VITAMIN C ER 1000 MG PO TBCR: 1000.0000 mg | EXTENDED_RELEASE_TABLET | Freq: Every day | ORAL | Status: DC

## 2014-01-24 MED ORDER — RISAQUAD PO CAPS
1.0000 | ORAL_CAPSULE | Freq: Every day | ORAL | Status: DC
  Administered 2014-01-24 – 2014-01-26 (×3): 1 via ORAL
  Filled 2014-01-24 (×3): qty 1

## 2014-01-24 MED ORDER — ONDANSETRON HCL 4 MG/2ML IJ SOLN: 4.0000 mg | Freq: Four times a day (QID) | INTRAMUSCULAR | Status: DC | PRN

## 2014-01-24 MED ORDER — ACETAMINOPHEN 650 MG RE SUPP: 650.0000 mg | Freq: Four times a day (QID) | RECTAL | Status: DC | PRN

## 2014-01-24 NOTE — ED Notes (Addendum)
Pt checked temp at home, 102.4, did not take anything at home. Pt last round of chemo on Monday and had shot to raise WBC on Tuesday at 1500. States since then her chest has been hurting but was told that was normal, states yesterday afternoon she felt bad with chills. States she is starting to feel worse. Pt c/o nausea as well.

## 2014-01-24 NOTE — ED Provider Notes (Signed)
CSN: [id number]     Arrival date & time 01/24/14  0175 History   First MD Initiated Contact with Patient 01/24/14 [phone]     Chief Complaint  Patient presents with  . Fever  . Chest Pain  . Chills     (Consider location/radiation/quality/duration/timing/severity/associated sxs/prior Treatment) Patient is a 68 y.o. female presenting with fever and chest pain. The history is provided by the patient. No language interpreter was used.  Fever Max temp prior to arrival:  102.6 Temp source:  Oral Onset quality:  Gradual Duration:  8 hours Timing:  Constant Progression:  Waxing and waning Chronicity:  New Relieved by:  Nothing Worsened by:  Nothing tried Associated symptoms: chest pain and chills   Associated symptoms: no confusion, no congestion, no cough, no diarrhea, no dysuria, no headaches, no nausea, no rash, no rhinorrhea, no somnolence, no sore throat and no vomiting   Chest pain:    Quality:  Dull and aching   Severity:  Mild   Duration:  1 day   Timing:  Intermittent   Progression:  Waxing and waning   Chronicity:  Recurrent (Gets similar, though usually more severe chest pain with every Granix injection) Risk factors: hx of cancer (currently undergoing chemo for metastatic endometrial cancer) and immunosuppression   Chest Pain Associated symptoms: fever   Associated symptoms: no abdominal pain, no back pain, no cough, no diaphoresis, no fatigue, no headache, no nausea, no numbness, no palpitations, no shortness of breath, not vomiting and no weakness     Past Medical History  Diagnosis Date  . Wears glasses   . Hypertension   . Diverticulitis 2010  . Hyperlipidemia     2007  . Cancer   . Endometrial cancer 11/02/2010  . Malignant neoplasm of corpus uteri, except isthmus 01/21/2011  . Anemia due to chemotherapy 01/23/2014   Past Surgical History  Procedure Laterality Date  . Gonadectomy and hysterectomy  11/24/2010    Endometrial cancer, performed by Dr. Janie Morning  . Mole removal  Nov 2012  . Abdominal hysterectomy  11/24/2010    RTLH, BSO, RPLND, LPLNS   Family History  Problem Relation Age of Onset  . Lung cancer Father     smoker  . Cancer Father   . Osteoporosis Mother   . Rheum arthritis Mother   . Cancer Mother    History  Substance Use Topics  . Smoking status: Never Smoker   . Smokeless tobacco: Never Used  . Alcohol Use: No   OB History    Gravida Para Term Preterm AB TAB SAB Ectopic Multiple Living   1 1 1       1      Review of Systems  Constitutional: Positive for fever and chills. Negative for diaphoresis, activity change, appetite change and fatigue.  HENT: Negative for congestion, facial swelling, rhinorrhea and sore throat.   Eyes: Negative for photophobia and discharge.  Respiratory: Negative for cough, chest tightness and shortness of breath.   Cardiovascular: Positive for chest pain. Negative for palpitations and leg swelling.  Gastrointestinal: Negative for nausea, vomiting, abdominal pain and diarrhea.  Endocrine: Negative for polydipsia and polyuria.  Genitourinary: Negative for dysuria, frequency, difficulty urinating and pelvic pain.  Musculoskeletal: Negative for back pain, arthralgias, neck pain and neck stiffness.  Skin: Negative for color change, rash and wound.  Allergic/Immunologic: Negative for immunocompromised state.  Neurological: Negative for facial asymmetry, weakness, numbness and headaches.  Hematological: Does not bruise/bleed easily.  Psychiatric/Behavioral: Negative for confusion  and agitation.      Allergies  Atorvastatin; Fish allergy; Lisinopril; Pravastatin sodium; and Simvastatin  Home Medications   Prior to Admission medications   Medication Sig Start Date End Date Taking? Authorizing Provider  acyclovir ointment (ZOVIRAX) 5 % Apply to fever blister 5 times a day Patient taking differently: Apply 1 application topically daily as needed (for fever blisters).  07/30/13  Yes  Lennis Marion Downer, MD  Ascorbic Acid (VITAMIN C CR) 1000 MG TBCR Take 1,000 mg by mouth daily.   Yes Historical Provider, MD  B Complex-C (B-COMPLEX WITH VITAMIN C) tablet Take 1 tablet by mouth daily.   Yes Historical Provider, MD  chlorthalidone (HYGROTON) 50 MG tablet Take 50 mg by mouth daily.   Yes Historical Provider, MD  dexamethasone (DECADRON) 4 MG tablet Take 5 tablets with food 12 hrs prior to Taxol Chemotherapy 01/21/14  Yes Lennis P Livesay, MD  LORazepam (ATIVAN) 1 MG tablet 1/2 to 1 tablet every 6 hours as needed for nausea. This will make you drowsy. Can swallow pill or let dissolve under tongue. Night of chemo, whether or not any nausea, take at bedtime 12/31/13  Yes Lennis P Livesay, MD  ondansetron (ZOFRAN) 8 MG tablet 1-2 tablets every 12 hours as needed for nausea. Take 1 tablet the AM after chemo whether or not any nausea. This will not make you drowsy. 09/03/13  Yes Lennis Marion Downer, MD  OVER THE COUNTER MEDICATION Take 1 tablet by mouth daily. Curamin   Yes Historical Provider, MD  OVER THE COUNTER MEDICATION Take 3 tablets by mouth daily. [place] Bone Strength (calcium product)   Yes Historical Provider, MD  OVER THE COUNTER MEDICATION Take 1 packet by mouth 2 (two) times daily. [place] Fleeting Vitamin Pak (contains fish oil, potassium and multi vitamins)   Yes Historical Provider, MD  OVER THE COUNTER MEDICATION Take 3 tablets by mouth 3 (three) times daily. Intestinal Sooth and Build   Yes Historical Provider, MD  OVER THE COUNTER MEDICATION Take 2 tablets by mouth 2 (two) times daily. Stress J   Yes Historical Provider, MD  OVER THE COUNTER MEDICATION Take 1 tablet by mouth daily. Liver Care   Yes Historical Provider, MD  OVER THE COUNTER MEDICATION Take 10 mLs by mouth daily with breakfast. Liquid chlorophyl   Yes Historical Provider, MD  OVER THE COUNTER MEDICATION Take 15 mLs by mouth daily with breakfast. Flax Seed Oil   Yes Historical Provider, MD  potassium chloride  20 MEQ/15ML (10%) SOLN Take 15 mLs (20 mEq total) by mouth 3 (three) times daily. 01/07/14  Yes Lennis Marion Downer, MD  PRESCRIPTION MEDICATION Chemo - [place]   Yes Historical Provider, MD  Probiotic Product (PROBIOTIC DAILY) CAPS Take 1 capsule by mouth daily.   Yes Historical Provider, MD  Ubiquinol 100 MG CAPS Take 100 mg by mouth daily with breakfast.    Yes Historical Provider, MD   BP 99/44 mmHg  Pulse 88  Temp(Src) 100.4 F (38 C) (Oral)  Resp 20  Ht 5\' 7"  (1.702 m)  Wt 215 lb (97.523 kg)  BMI 33.67 kg/m2  SpO2 97% Physical Exam  Constitutional: She is oriented to person, place, and time. She appears well-developed and well-nourished. No distress.  HENT:  Head: Normocephalic and atraumatic.  Mouth/Throat: No oropharyngeal exudate.  Eyes: Pupils are equal, round, and reactive to light.  Neck: Normal range of motion. Neck supple.  Cardiovascular: Normal rate, regular rhythm and normal heart sounds.  Exam reveals  no gallop and no friction rub.   No murmur heard. Pulmonary/Chest: Effort normal and breath sounds normal. No respiratory distress. She has no wheezes. She has no rales.  Abdominal: Soft. Bowel sounds are normal. She exhibits no distension and no mass. There is no tenderness. There is no rebound and no guarding.  Musculoskeletal: Normal range of motion. She exhibits edema (1+ BLLE). She exhibits no tenderness.  Neurological: She is alert and oriented to person, place, and time.  Skin: Skin is warm and dry.  Psychiatric: She has a normal mood and affect.    ED Course  Procedures (including critical care time) Labs Review Labs Reviewed  CBC WITH DIFFERENTIAL - Abnormal; Notable for the following:    RBC 2.96 (*)    Hemoglobin 9.7 (*)    HCT 28.8 (*)    RDW 18.2 (*)    Platelets 115 (*)    Neutrophils Relative % 84 (*)    Lymphs Abs 0.6 (*)    All other components within normal limits  COMPREHENSIVE METABOLIC PANEL - Abnormal; Notable for the following:    Potassium  3.3 (*)    Chloride 95 (*)    AST 67 (*)    ALT 56 (*)    Anion gap 18 (*)    All other components within normal limits  URINALYSIS, ROUTINE W REFLEX MICROSCOPIC - Abnormal; Notable for the following:    pH 8.5 (*)    Leukocytes, UA TRACE (*)    All other components within normal limits  URINE MICROSCOPIC-ADD ON - Abnormal; Notable for the following:    Bacteria, UA FEW (*)    All other components within normal limits  I-STAT CG4 LACTIC ACID, ED - Abnormal; Notable for the following:    Lactic Acid, Venous 3.75 (*)    All other components within normal limits  CULTURE, BLOOD (ROUTINE X 2)  CULTURE, BLOOD (ROUTINE X 2)  URINE CULTURE  CULTURE, BLOOD (ROUTINE X 2)  CULTURE, BLOOD (ROUTINE X 2)  PROCALCITONIN  INFLUENZA PANEL BY PCR (TYPE A & B, H1N1)  I-STAT TROPOININ, ED    Imaging Review Dg Chest Port 1 View  01/24/2014   CLINICAL DATA:  Fever and chest pain and chills.  EXAM: PORTABLE CHEST - 1 VIEW  COMPARISON:  11/20/2010  FINDINGS: Heart size and pulmonary vascularity are normal and the lungs are clear. No osseous abnormality.  IMPRESSION: Normal chest.   Electronically Signed   By: Rozetta Nunnery M.D.   On: 01/24/2014 08:53     EKG Interpretation   Date/Time:  Thursday January 24 2014 07:45:40 EST Ventricular Rate:  109 PR Interval:  149 QRS Duration: 92 QT Interval:  306 QTC Calculation: 412 R Axis:   -21 Text Interpretation:  Sinus tachycardia Abnormal R-wave progression, late  transition Probable LVH with secondary repol abnrm which is new from prior  in '12 Confirmed by Taejah Ohalloran  MD, [place] [phone]) on 01/24/2014 8:07:18 AM      MDM   Final diagnoses:  SIRS (systemic inflammatory response syndrome)    Pt is a 68 y.o. female with Pmhx as above who presents with fever, chills, since late last night, up to 102.6 at home PTA. She is currently undergoing chemo for metastatic endometrial cancer (last tx 3 days ago) and is also receiving Granix infusions (most  recently 2 days ago). She denies cough, SOB, n/v, d/a, urinary symptoms, abdominal pain, though has had a dull, central, intermittent CP since yesterday afternoon which is similar, though  less severe to CP she states she gets w/ every Granix infusion. No aggravating or alleviating symptoms. No leg pain/swelling. On PE, Pt meets level II sepsis criteria w/ fever, tachycardia and known immunosuppression. Protocol initiated and broad spectrum abx will be initiated.   MAP's around 55 despite 2L NS, 3rd L started. No source found based on w/u. LA 3.75 She has nml WBC w/ L shift. [facility] consulted, will admit to stepdown.      Ernestina Patches, MD 01/24/14 [phone]

## 2014-01-24 NOTE — Progress Notes (Signed)
ANTIBIOTIC CONSULT NOTE - INITIAL  Pharmacy Consult for Vancomycin, Zosyn Indication: Sepsis  Allergies  Allergen Reactions  . Atorvastatin Other (See Comments)     myalgias  . Fish Allergy Nausea And Vomiting  . Lisinopril Other (See Comments)     Cough  . Pravastatin Sodium Other (See Comments)     myalgias  . Simvastatin Other (See Comments)     myalgias    Patient Measurements: Height: 5\' 7"  (170.2 cm) Weight: 215 lb (97.523 kg) IBW/kg (Calculated) : 61.6 Adjusted Body Weight:   Vital Signs: Temp: 100.7 F (38.2 C) (12/03 0742) Temp Source: Oral (12/03 0742) BP: 158/71 mmHg (12/03 0742) Pulse Rate: 118 (12/03 0742) Intake/Output from previous day:   Intake/Output from this shift:    Labs:  Recent Labs  01/21/14 1158  WBC 5.7  HGB 10.9*  PLT 154  CREATININE 0.8   Estimated Creatinine Clearance: 83 mL/min (by C-G formula based on Cr of 0.8). No results for input(s): VANCOTROUGH, VANCOPEAK, VANCORANDOM, GENTTROUGH, GENTPEAK, GENTRANDOM, TOBRATROUGH, TOBRAPEAK, TOBRARND, AMIKACINPEAK, AMIKACINTROU, AMIKACIN in the last 72 hours.   Microbiology: No results found for this or any previous visit (from the past 720 hour(s)).  Medical History: Past Medical History  Diagnosis Date  . Wears glasses   . Hypertension   . Diverticulitis 2010  . Hyperlipidemia     2007  . Cancer   . Endometrial cancer 11/02/2010  . Malignant neoplasm of corpus uteri, except isthmus 01/21/2011  . Anemia due to chemotherapy 01/23/2014    Assessment: 66 yoF with recurrent endometrial carcinoma currently on carboplatin and taxol (dose dense) regimen with last chemotherapy infusion on 01/21/14 and filgrastim support on 12/1.  Pt presents to ED with fever, chills, and chest pain.  Pharmacy consulted to dose vancomycin and zosyn for sepsis.  No antibiotic allergies noted.    12/3 >> Vancomycin  >> 12/3 >> Zosyn  >>    Tmax: 102.6 (per pt report PTA) WBCs: 5.1K, ANC 4.3 Renal: SCr  0.66, CrCl 83 ml/min (N 94) no hx CKD Lactic Acid: 3.75  12/3 blood: ordered 12/3 urine: ordered   Goal of Therapy:  Vancomycin trough level 15-20 mcg/ml  Eradication of Infection  Plan:  Zosyn 3.375g IV q8h (infuse over 4 hours) Additional vancomycin 1g x 1 now to make total loading dose of 2g, then start Vancomycin 1g IV q12h (using obese nomogram) Follow-up cultures, renal function, vancomycin trough as warranted, clinical course  Ralene Bathe, PharmD, BCPS 01/24/2014, 8:27 AM  Pager: [phone]

## 2014-01-24 NOTE — Progress Notes (Signed)
PHARMACIST - PHYSICIAN ORDER COMMUNICATION  TOPIC: P&T Medication Policy on Herbal Medications  DISCUSSION:  This patient's order for Ubiquinol (a form of co-enzyme Q-10) has been noted.  This product is classified as an "herbal" or natural product.  The Pharmacy and Therapeutics Committee does not permit the use of "herbal" or natural products of this type within [facility].  This policy was adopted due to a lack of definitive safety studies, lack of FDA approval, nonstandard manufacturing practices, and the potential risk of unknown drug-drug interactions with inpatient medications   ACTION TAKEN: The order for Ubiquinol has been discontinued.  At discharge, please determine whether or not  the order for this supplement should be restarted.  GroverPh. 01/24/2014 5:48 PM

## 2014-01-24 NOTE — ED Notes (Signed)
Report given to Pam, RN.

## 2014-01-24 NOTE — H&P (Signed)
[facility] History and Physical  Erika Strong FKC:[id number] DOB: [birth year] DOA: 01/24/2014  Referring physician: Dr. Tawnya Crook  PCP: Beatrice Lecher, MD   Chief Complaint: fever and general malaise  HPI: Erika Strong is a 68 y.o. female with pmh significant for metastatic endometrial cancer on chemotherapy; HTN, HLD, anemia and thrombocytopenia due to chemotherapy; presented to ED complaining of general malaise and high grade fever for the last 48 hours. No CP, no cough, no dysuria, no melena, no hematochezia, no N/V or any other complaints. Patient last chemotherapy occurred on 01/21/14. UA and CXR neg in ED. WBC's WNL. [facility] called to admit patient for further evaluation and treatment.  Patient BP was soft on admission, she was mild tachycardic and with elevated lactic acid. Admitted for SIRS   Review of Systems:  Negative except as mentioned on HPI  Past Medical History  Diagnosis Date  . Wears glasses   . Hypertension   . Diverticulitis 2010  . Hyperlipidemia     2007  . Cancer   . Endometrial cancer 11/02/2010  . Malignant neoplasm of corpus uteri, except isthmus 01/21/2011  . Anemia due to chemotherapy 01/23/2014   Past Surgical History  Procedure Laterality Date  . Gonadectomy and hysterectomy  11/24/2010    Endometrial cancer, performed by Dr. Janie Morning  . Mole removal  Nov 2012  . Abdominal hysterectomy  11/24/2010    RTLH, BSO, RPLND, LPLNS   Social History:  reports that she has never smoked. She has never used smokeless tobacco. She reports that she does not drink alcohol or use illicit drugs.  Allergies  Allergen Reactions  . Atorvastatin Other (See Comments)     myalgias  . Fish Allergy Nausea And Vomiting  . Lisinopril Other (See Comments)     Cough  . Pravastatin Sodium Other (See Comments)     myalgias  . Simvastatin Other (See Comments)     myalgias    Family History  Problem Relation Age of Onset  . Lung cancer Father     smoker   . Cancer Father   . Osteoporosis Mother   . Rheum arthritis Mother   . Cancer Mother      Prior to Admission medications   Medication Sig Start Date End Date Taking? Authorizing Provider  Ascorbic Acid (VITAMIN C CR) 1000 MG TBCR Take 1,000 mg by mouth daily.   Yes Historical Provider, MD  B Complex-C (B-COMPLEX WITH VITAMIN C) tablet Take 1 tablet by mouth daily.   Yes Historical Provider, MD  chlorthalidone (HYGROTON) 50 MG tablet Take 50 mg by mouth daily.   Yes Historical Provider, MD  dexamethasone (DECADRON) 4 MG tablet Take 5 tablets with food 12 hrs prior to Taxol Chemotherapy 01/21/14  Yes Lennis P Livesay, MD  LORazepam (ATIVAN) 1 MG tablet 1/2 to 1 tablet every 6 hours as needed for nausea. This will make you drowsy. Can swallow pill or let dissolve under tongue. Night of chemo, whether or not any nausea, take at bedtime 12/31/13  Yes Lennis P Livesay, MD  ondansetron (ZOFRAN) 8 MG tablet 1-2 tablets every 12 hours as needed for nausea. Take 1 tablet the AM after chemo whether or not any nausea. This will not make you drowsy. 09/03/13  Yes Lennis Marion Downer, MD  OVER THE COUNTER MEDICATION Take 1 tablet by mouth daily. Curamin   Yes Historical Provider, MD  OVER THE COUNTER MEDICATION Take 3 tablets by mouth daily. [place] Bone Strength (calcium product)  Yes Historical Provider, MD  OVER THE COUNTER MEDICATION Take 1 packet by mouth 2 (two) times daily. [place] Fleeting Vitamin Pak (contains fish oil, potassium and multi vitamins)   Yes Historical Provider, MD  OVER THE COUNTER MEDICATION Take 3 tablets by mouth 3 (three) times daily. Intestinal Sooth and Build   Yes Historical Provider, MD  OVER THE COUNTER MEDICATION Take 2 tablets by mouth 2 (two) times daily. Stress J   Yes Historical Provider, MD  OVER THE COUNTER MEDICATION Take 1 tablet by mouth daily. Liver Care   Yes Historical Provider, MD  OVER THE COUNTER MEDICATION Take 10 mLs by mouth daily with breakfast. Liquid  chlorophyl   Yes Historical Provider, MD  OVER THE COUNTER MEDICATION Take 15 mLs by mouth daily with breakfast. Flax Seed Oil   Yes Historical Provider, MD  potassium chloride 20 MEQ/15ML (10%) SOLN Take 15 mLs (20 mEq total) by mouth 3 (three) times daily. 01/07/14  Yes Lennis Marion Downer, MD  PRESCRIPTION MEDICATION Chemo - [place]   Yes Historical Provider, MD  Probiotic Product (PROBIOTIC DAILY) CAPS Take 1 capsule by mouth daily.   Yes Historical Provider, MD  Ubiquinol 100 MG CAPS Take 100 mg by mouth daily with breakfast.    Yes Historical Provider, MD  acyclovir ointment (ZOVIRAX) 5 % Apply to fever blister 5 times a day Patient taking differently: Apply 1 application topically daily as needed (for fever blisters).  07/30/13   Lennis Marion Downer, MD   Physical Exam: Filed Vitals:   01/24/14 1131 01/24/14 1227 01/24/14 1500 01/24/14 1600  BP: 109/44 127/47 116/55 119/50  Pulse: 80  73 73  Temp:  98.8 F (37.1 C)    TempSrc:      Resp: 18  14 17   Height:  5\' 7"  (1.702 m)    Weight:  91.3 kg (201 lb 4.5 oz)    SpO2: 99%  100% 99%    Wt Readings from Last 3 Encounters:  01/24/14 91.3 kg (201 lb 4.5 oz)  01/21/14 97.614 kg (215 lb 3.2 oz)  12/31/13 95.664 kg (210 lb 14.4 oz)    General:  Appears calm and comfortable; currently just warm and sweat after fever subsided. No cough, no CP and no SOB Eyes: PERRL, normal lids, irises & conjunctiva, no icterus, no nystagmus ENT: grossly normal hearing, dry MM, no erythema or exudates Neck: no LAD, masses or thyromegaly Cardiovascular: RRR, no m/r/g. No LE edema. Respiratory: CTA bilaterally, no w/r/r. Normal respiratory effort. Abdomen: soft, ntnd Skin: no rash or induration seen on limited exam Musculoskeletal: grossly normal tone BUE/BLE Psychiatric: grossly normal mood and affect, speech fluent and appropriate Neurologic: grossly non-focal.          Labs on Admission:  Basic Metabolic Panel:  Recent Labs Lab 01/21/14 1158  01/24/14 0803  NA 135* 139  K 3.9 3.3*  CL  --  95*  CO2 23 26  GLUCOSE 184* 91  BUN 13.2 16  CREATININE 0.8 0.66  CALCIUM 9.8 9.3   Liver Function Tests:  Recent Labs Lab 01/21/14 1158 01/24/14 0803  AST 26 67*  ALT 34 56*  ALKPHOS 61 60  BILITOT 0.40 0.7  PROT 6.8 6.3  ALBUMIN 3.8 3.5   CBC:  Recent Labs Lab 01/21/14 1158 01/24/14 0803  WBC 5.7 5.1  NEUTROABS 4.3 4.3  HGB 10.9* 9.7*  HCT 32.3* 28.8*  MCV 96.1 97.3  PLT 154 115*    Radiological Exams on Admission: Dg Chest [facility]  1 View  01/24/2014   CLINICAL DATA:  Fever and chest pain and chills.  EXAM: PORTABLE CHEST - 1 VIEW  COMPARISON:  11/20/2010  FINDINGS: Heart size and pulmonary vascularity are normal and the lungs are clear. No osseous abnormality.  IMPRESSION: Normal chest.   Electronically Signed   By: Rozetta Nunnery M.D.   On: 01/24/2014 08:53    EKG: None  Assessment/Plan 1-SIRS (systemic inflammatory response syndrome): unclear etiology at this moment. -will cover with broad spectrum antibiotics -follow blood cx's and urine cx's -repeat 2 views CXR in am after IVF's given -provide supportive care -could be just response to Granix injection -will provide PRN antiemetics and antipyretics -will check flu by PCR and PCT algorithm   2-HLD (hyperlipidemia): with hx of allergic reaction and intolerance to statins -will continue fish oil at discharge  3-HYPERTENSION, BENIGN: stable and slight hypotensive on admission -will hold chorthalidone for now -providing gentle IVF's  4-Endometrial cancer: actively receiving chemotherapy -will follow with oncology as an outpatient -most recent tx on 01/21/14  5-thrombocytopenia and Anemia due to chemotherapy: essentially stable and at baseline for her -no signs of acute bleeding -will avoid heparin products  6-Hypokalemia: due to diuretics -holding diuretics for now -will replete as needed -check Mg level  7-Mild dehydration: patient reports N/V  and decrease intake following chemotherapy -feeling better now -hold diuretics -provide IVF's -treat symptomatically (PRN antiemetics)  8-lactic acidosis: due to dehydration most likely. -will continue IVF's -check lactic acid in am   Dr. Marko Plume advice of admission through Epic  Code Status: Full DVT Prophylaxis:SCD's Family Communication: no family at bedside Disposition Plan: admit to stepdown, inpatient; LOS > 2 midnights  Time spent: 50 minutes  Barton Dubois [facility] Pager [phone]

## 2014-01-24 NOTE — ED Notes (Signed)
Per md pt allowed to have regular diet

## 2014-01-24 NOTE — ED Notes (Signed)
Per pharmacy, pt is to get 2 g vancomycin at this time. Infuse second dose after first is finished for a loading dose.

## 2014-01-24 NOTE — Progress Notes (Signed)
01/24/2014, 5:35 PM  Hospital day 1 Antibiotics: Vanc, zosyn Chemotherapy: day 8 cycle 9 carbo taxol given 01-21-14  Outpatient physicians:Brewster, Abigail Butts; Madilyn Fireman, Barnetta Chapel; Darlis Loan ([facility]); Evlyn Clines  Learned of patient's admission to hospitalist service from ED early this AM with temperature 102.4 and shaking chills. She is under active treatment with chemotherapy for recurrent endometrial carcinoma, most recently day 8 cycle 9 carboplatin taxol on 01-21-14 and neupogen on 01-22-14. She will have repeat CT after this cycle She does not have PAC. She has done very well with chemotherapy to this point, with no other significant complications.  Subjective: Chest/ sternal pain last pm as she has had previously with gCSF, then chills. Temp subsequently to 101 at home, up to 102.6 in ED. No localizing symptoms of infection. Sternal pain has resolved and she is feeling better and hungry this pm. No cough or SOB, no esophagitis, no bladder symptoms, no GI symptoms, no other new or different pain.   ONCOLOGIC HISTORY Patient presented with vaginal spotting 07-2010. Endometrial biopsy 10-21-10 revealed endometroid carcinoma grade 1 focally involving polyp. She had robotic assisted total laparoscopic hysterectomy BSO and 20 pelvic node evaluation by Dr Skeet Latch on 11-24-2010. Pathology [phone]): endometrial adenocarcinoma Grade I with focal squamous differentiation, superficial myometrial invasion ( 0.2 cm where myometrium 1.4 cm); benign cervix, tubes and ovaries; no LVSI. With no high risk features, she was followed with observation only, including exams by gyn oncology every 6 months.  In early March 2015 she had acute onset of lower abdominal pain, then fever and nausea over next several days. She was treated by PCP for presumed diverticulitis with cipro and flagyl, initially with improvement then symptoms recurrent in late March. She had CT AP 05-23-13 showed a 4.1x4.1 cm mass  left anterior upper pelvis, and CT biopsy 05-31-13 of mesenteric mass at level of umbilicus showed metastatic adenocarcinoma consistent with endometrioid carcinoma. Patient saw Dr Skeet Latch at [facility] on 06-08-13, with exam remarkable for palpable right pelvic mass and recommendation for 3 cycles of taxane / platin chemotherapy, then repeat imaging; may also consider radiation after chemotherapy. The initial abdominal pain resolved entirely. Cycle 1 taxol carboplatin was given 06-26-2013; she was neutropenic with ANC 0 on day 14. Repeat CT AP after cycle 3 had partial response of dominant ventral peritoneal mass, right pelvic sidewall mass and implant along sigmoid colon. She completed additional 3 cycles of carboplatin and taxol for total 6 cycles on 10-18-13, with neulasta support. Restaging CT showed partial response but still residual involvement including intraperitoneal disease, with additional 3 cycles carbo taxol recommended. She had cycle 1 day 1 dose dense carbo taxol on 11-26-13, with carbo skin test.    Objective: Vital signs in last 24 hours: Blood pressure 119/50, pulse 73, temperature 98.8 F (37.1 C), temperature source Oral, resp. rate 17, height 5\' 7"  (1.702 m), weight 201 lb 4.5 oz (91.3 kg), SpO2 99 %.   Intake/Output from previous day:   Intake/Output this shift: Total I/O In: 3000 [I.V.:3000] Out: 2000 [Urine:2000]  Physical exam: awake, alert, looks comfortable seated in bed on RA eating regular diet. Fully oriented, good historian. Alopecia. PERRL, not icteric. Oral mucosa moist and clear. Lungs without wheezes or rales. Heart RRR. No central catheter, peripheral IV RUE. Abdomen soft, not tender, + BS, not distended. PAS on, no edema, cords. Moves all extremities easily. Speech fluent, no focal neuro deficits  Lab Results:  Recent Labs  01/24/14 0803  WBC 5.1  HGB 9.7*  HCT 28.8*  PLT 115*   BMET  Recent Labs  01/24/14 0803  NA 139  K 3.3*  CL 95*  CO2 26  GLUCOSE 91   BUN 16  CREATININE 0.66  CALCIUM 9.3   Blood cultures pending Urine culture pending  Studies/Results: Dg Chest Port 1 View  01/24/2014   CLINICAL DATA:  Fever and chest pain and chills.  EXAM: PORTABLE CHEST - 1 VIEW  COMPARISON:  11/20/2010  FINDINGS: Heart size and pulmonary vascularity are normal and the lungs are clear. No osseous abnormality.  IMPRESSION: Normal chest.   Electronically Signed   By: Rozetta Nunnery M.D.   On: 01/24/2014 08:53     Assessment/Plan: 1.Fever, shaking chills in patient immunocompromised from recent chemotherapy, tho not neutropenic: symptoms better over last several hours. Agree with continuing empiric antibiotics awaiting culture results 2.endometrial carcinoma recurrent in pelvis and intraperitoneal disease. Likely will skip day 15 cycle 9 chemo 01-28-14 since this problem, but still get CT as planned prior to re-evaluation by gyn oncology 02-11-14. 3.flu vaccine done 4.taxol related peripheral neuropathy in feet 5.HTN, fatty liver, hx diverticulosis 6.hypokalemia PTA, may need additional supplements in hospital 7.chemo anemia and thrombocytopenia: follow counts  Care by hospitalist team much appreciated. Please let me know if I can be of help while she is in hospital.  Ayame Rena P Pager [phone] Office [phone]

## 2014-01-24 NOTE — Progress Notes (Signed)
UR completed 

## 2014-01-25 ENCOUNTER — Inpatient Hospital Stay (HOSPITAL_COMMUNITY): Payer: Medicare Other

## 2014-01-25 LAB — CBC
HCT: 24.9 % — ABNORMAL LOW (ref 36.0–46.0)
HEMOGLOBIN: 8.3 g/dL — AB (ref 12.0–15.0)
MCH: 32.8 pg (ref 26.0–34.0)
MCHC: 33.3 g/dL (ref 30.0–36.0)
MCV: 98.4 fL (ref 78.0–100.0)
Platelets: 95 10*3/uL — ABNORMAL LOW (ref 150–400)
RBC: 2.53 MIL/uL — AB (ref 3.87–5.11)
RDW: 18.7 % — ABNORMAL HIGH (ref 11.5–15.5)
WBC: 4.6 10*3/uL (ref 4.0–10.5)

## 2014-01-25 LAB — MAGNESIUM: Magnesium: 1.8 mg/dL (ref 1.5–2.5)

## 2014-01-25 LAB — COMPREHENSIVE METABOLIC PANEL
ALBUMIN: 2.7 g/dL — AB (ref 3.5–5.2)
ALT: 68 U/L — ABNORMAL HIGH (ref 0–35)
AST: 47 U/L — AB (ref 0–37)
Alkaline Phosphatase: 40 U/L (ref 39–117)
Anion gap: 11 (ref 5–15)
BUN: 10 mg/dL (ref 6–23)
CALCIUM: 8.1 mg/dL — AB (ref 8.4–10.5)
CHLORIDE: 100 meq/L (ref 96–112)
CO2: 27 mEq/L (ref 19–32)
CREATININE: 0.79 mg/dL (ref 0.50–1.10)
GFR calc Af Amer: >90 mL/min (ref >90)
GFR calc non Af Amer: 85 mL/min — ABNORMAL LOW (ref >90)
Glucose, Bld: 100 mg/dL — ABNORMAL HIGH (ref 70–99)
Potassium: 3.4 mEq/L — ABNORMAL LOW (ref 3.7–5.3)
SODIUM: 138 meq/L (ref 137–147)
Total Bilirubin: 0.3 mg/dL (ref 0.3–1.2)
Total Protein: 5.2 g/dL — ABNORMAL LOW (ref 6.0–8.3)

## 2014-01-25 LAB — URINE CULTURE
COLONY COUNT: NO GROWTH
CULTURE: NO GROWTH

## 2014-01-25 LAB — LACTIC ACID, PLASMA: Lactic Acid, Venous: 2.3 mmol/L — ABNORMAL HIGH (ref 0.5–2.2)

## 2014-01-25 MED ORDER — POTASSIUM CHLORIDE 20 MEQ/15ML (10%) PO SOLN
40.0000 meq | Freq: Two times a day (BID) | ORAL | Status: DC
  Administered 2014-01-25 – 2014-01-26 (×2): 40 meq via ORAL
  Filled 2014-01-25 (×3): qty 30

## 2014-01-25 MED ORDER — POTASSIUM CHLORIDE IN NACL 40-0.9 MEQ/L-% IV SOLN
INTRAVENOUS | Status: DC
  Administered 2014-01-25: 50 mL/h via INTRAVENOUS
  Filled 2014-01-25 (×2): qty 1000

## 2014-01-25 NOTE — Plan of Care (Signed)
Problem: Phase I Progression Outcomes Goal: Pain controlled with appropriate interventions Outcome: Completed/Met Date Met:  01/25/14 Goal: OOB as tolerated unless otherwise ordered Outcome: Progressing Goal: Voiding-avoid urinary catheter unless indicated Outcome: Completed/Met Date Met:  01/25/14 Goal: Hemodynamically stable Outcome: Completed/Met Date Met:  01/25/14

## 2014-01-25 NOTE — Plan of Care (Signed)
Problem: Consults Goal: General Medical Patient Education See Patient Education Module for specific education.  Outcome: Completed/Met Date Met:  01/25/14 Goal: Skin Care Protocol Initiated - if Braden Score 18 or less If consults are not indicated, leave blank or document N/A  Outcome: Not Applicable Date Met:  89/37/34 Goal: Nutrition Consult-if indicated Outcome: Not Applicable Date Met:  28/76/81 Goal: Diabetes Guidelines if Diabetic/Glucose > 140 If diabetic or lab glucose is > 140 mg/dl - Initiate Diabetes/Hyperglycemia Guidelines & Document Interventions  Outcome: Not Applicable Date Met:  15/72/62  Problem: Phase I Progression Outcomes Goal: OOB as tolerated unless otherwise ordered Outcome: Completed/Met Date Met:  01/25/14 Patient up to the bathroom with supervision only. Goal: Initial discharge plan identified Outcome: Completed/Met Date Met:  01/25/14 Goal: Other Phase I Outcomes/Goals Outcome: Not Applicable Date Met:  03/55/97

## 2014-01-25 NOTE — Progress Notes (Signed)
Report received from Lucina Mellow, RN from ICU.  Patient transferred from ICU to room 1336.  Patient alert and oriented x 4.  Patient oriented to room and call bell.  Patient stable and with no complaints at this time.  Zandra Abts [facility]  01/25/2014  4:04 PM

## 2014-01-25 NOTE — Progress Notes (Signed)
[facility] PROGRESS NOTE  HOLY BATTENFIELD MWN:[id number] DOB: [birth year] DOA: 01/24/2014 PCP: Beatrice Lecher, MD  Assessment/Plan: 1-SIRS (systemic inflammatory response syndrome): unclear etiology at this moment. -will continue covering with broad spectrum antibiotics -follow blood cx's and urine cx's -repeated 2 views CXR on 01/25/14 after rehydration confirmed no PNA -continue supportive care -could be just response to Granix injection -will provide PRN antiemetics and antipyretics -Flu neg -procalcitonin 5.7; continue IV antibiotics and follow response  2-HLD (hyperlipidemia): with hx of allergic reaction and intolerance to statins -will continue fish oil at discharge  3-HYPERTENSION, BENIGN: stable and slight hypotensive on admission -will hold chorthalidone for now -continue providing gentle IVF's  4-Endometrial cancer: actively receiving chemotherapy -will continue following with oncology as an outpatient -most recent tx on 01/21/14 -oncology notified and will follow any rec's  5-thrombocytopenia and Anemia due to chemotherapy: essentially stable and at baseline for her -no signs of acute bleeding -will avoid heparin products -no need for transfusion; decrease in her Hgb could be 2/2 to hemodilution -will monitor  6-Hypokalemia: due to diuretics -holding diuretics for now -will continue repletion as needed -Mg level WNL  7-Mild dehydration: patient reports N/V and decrease intake following chemotherapy -feeling better now and able to eat and drink properly -continue holding diuretics -continue IVF's; at adjusted rate now -treat symptomatically (PRN antiemetics)  8-lactic acidosis: due to dehydration most likely. -will continue IVF's; but now that she is eating and drinking better will adjust to 50 cc/hr -lactic acid 2.3  Code Status: Full Family Communication: husband and son at bedside  Disposition Plan: will move out of stepdown  unit   Consultants:  Oncology   Procedures:  See below for x-ray reports   Antibiotics:  Vancomycin 12/2  Zosyn 12/2  HPI/Subjective: Feeling better and with significant improvement in her fever curve. No CP, no SOB, no abd pain, no N/V  Objective: Filed Vitals:   01/25/14 1239  BP:   Pulse:   Temp: 99.1 F (37.3 C)  Resp:     Intake/Output Summary (Last 24 hours) at 01/25/14 1352 Last data filed at 01/25/14 1200  Gross per 24 hour  Intake   3990 ml  Output   6150 ml  Net  -2160 ml   Filed Weights   01/24/14 0809 01/24/14 1227 01/25/14 0400  Weight: 97.523 kg (215 lb) 91.3 kg (201 lb 4.5 oz) 91 kg (200 lb 9.9 oz)    Exam:   General:  Feeling better; no cough, no CP or SOB. Tmax 99.7  Cardiovascular: s1 and s2; no rubs or gallops; RRR  Respiratory: CTA bilaterally  Abdomen: soft, NT, ND, positive BS  Musculoskeletal: no edema or cyanosis   Data Reviewed: Basic Metabolic Panel:  Recent Labs Lab 01/21/14 1158 01/24/14 0803 01/25/14 0437  NA 135* 139 138  K 3.9 3.3* 3.4*  CL  --  95* 100  CO2 23 26 27   GLUCOSE 184* 91 100*  BUN 13.2 16 10   CREATININE 0.8 0.66 0.79  CALCIUM 9.8 9.3 8.1*  MG  --   --  1.8   Liver Function Tests:  Recent Labs Lab 01/21/14 1158 01/24/14 0803 01/25/14 0437  AST 26 67* 47*  ALT 34 56* 68*  ALKPHOS 61 60 40  BILITOT 0.40 0.7 0.3  PROT 6.8 6.3 5.2*  ALBUMIN 3.8 3.5 2.7*   CBC:  Recent Labs Lab 01/21/14 1158 01/24/14 0803 01/25/14 0437  WBC 5.7 5.1 4.6  NEUTROABS 4.3 4.3  --   HGB 10.9*  9.7* 8.3*  HCT 32.3* 28.8* 24.9*  MCV 96.1 97.3 98.4  PLT 154 115* 95*    Recent Results (from the past 240 hour(s))  Urine culture     Status: None   Collection Time: 01/24/14  8:15 AM  Result Value Ref Range Status   Specimen Description URINE, CLEAN CATCH  Final   Special Requests NONE  Final   Culture  Setup Time   Final    01/24/2014 10:16 Performed at [place] Performed at [facility]   Final   Culture NO GROWTH Performed at [facility]   Final   Report Status 01/25/2014 FINAL  Final  MRSA PCR Screening     Status: None   Collection Time: 01/24/14 12:27 PM  Result Value Ref Range Status   MRSA by PCR NEGATIVE NEGATIVE Final    Comment:        The GeneXpert MRSA Assay (FDA approved for NASAL specimens only), is one component of a comprehensive MRSA colonization surveillance program. It is not intended to diagnose MRSA infection nor to guide or monitor treatment for MRSA infections.      Studies: Dg Chest 2 View  01/25/2014   CLINICAL DATA:  History of endometrial malignancy on chemotherapy with development of fever this week  EXAM: CHEST  2 VIEW  COMPARISON:  Portable chest x-ray of January 24, 2014  FINDINGS: The lungs are adequately inflated and clear. The heart and pulmonary vascularity are within the limits of normal. There is no pleural effusion or pneumothorax. The mediastinum is normal in width. There is mild degenerative disc space narrowing of the mid thoracic spine.  IMPRESSION: There is no evidence of pneumonia nor other acute cardiopulmonary abnormality.   Electronically Signed   By: David  Martinique   On: 01/25/2014 10:15   Dg Chest Port 1 View  01/24/2014   CLINICAL DATA:  Fever and chest pain and chills.  EXAM: PORTABLE CHEST - 1 VIEW  COMPARISON:  11/20/2010  FINDINGS: Heart size and pulmonary vascularity are normal and the lungs are clear. No osseous abnormality.  IMPRESSION: Normal chest.   Electronically Signed   By: Rozetta Nunnery M.D.   On: 01/24/2014 08:53    Scheduled Meds: . acidophilus  1 capsule Oral Daily  . B-complex with vitamin C  1 tablet Oral Daily  . piperacillin-tazobactam (ZOSYN)  IV  3.375 g Intravenous 3 times per day  . potassium chloride  40 mEq Oral BID  . vancomycin  1,000 mg Intravenous Q12H  . vitamin C  1,000 mg Oral Daily   Continuous Infusions: . 0.9 % NaCl with KCl  40 mEq / L 100 mL/hr (01/24/14 1800)    Principal Problem:   SIRS (systemic inflammatory response syndrome) Active Problems:   HLD (hyperlipidemia)   HYPERTENSION, BENIGN   Endometrial cancer   Anemia due to chemotherapy   Hypokalemia   Mild dehydration    Time spent: 30 minutes    Barton Dubois  [facility] Pager [phone]. If 7PM-7AM, please contact night-coverage at [email or web address], password [facility] 01/25/2014, 1:52 PM  LOS: 1 day

## 2014-01-26 ENCOUNTER — Other Ambulatory Visit: Payer: Self-pay | Admitting: Oncology

## 2014-01-26 ENCOUNTER — Telehealth: Payer: Self-pay | Admitting: Oncology

## 2014-01-26 DIAGNOSIS — R509 Fever, unspecified: Secondary | ICD-10-CM | POA: Insufficient documentation

## 2014-01-26 DIAGNOSIS — E785 Hyperlipidemia, unspecified: Secondary | ICD-10-CM

## 2014-01-26 LAB — BASIC METABOLIC PANEL
Anion gap: 11 (ref 5–15)
BUN: 11 mg/dL (ref 6–23)
CHLORIDE: 102 meq/L (ref 96–112)
CO2: 27 meq/L (ref 19–32)
Calcium: 9 mg/dL (ref 8.4–10.5)
Creatinine, Ser: 0.78 mg/dL (ref 0.50–1.10)
GFR calc Af Amer: >90 mL/min (ref >90)
GFR, EST NON AFRICAN AMERICAN: 85 mL/min — AB (ref >90)
GLUCOSE: 92 mg/dL (ref 70–99)
POTASSIUM: 4.2 meq/L (ref 3.7–5.3)
SODIUM: 140 meq/L (ref 137–147)

## 2014-01-26 LAB — MAGNESIUM: MAGNESIUM: 1.8 mg/dL (ref 1.5–2.5)

## 2014-01-26 LAB — PHOSPHORUS: PHOSPHORUS: 3.8 mg/dL (ref 2.3–4.6)

## 2014-01-26 MED ORDER — POTASSIUM CHLORIDE 20 MEQ/15ML (10%) PO SOLN: 30.0000 meq | Freq: Three times a day (TID) | ORAL | Status: DC

## 2014-01-26 MED ORDER — LEVOFLOXACIN 750 MG PO TABS: 750.0000 mg | ORAL_TABLET | Freq: Every day | ORAL | Status: DC

## 2014-01-26 MED ORDER — AMLODIPINE BESYLATE 2.5 MG PO TABS: 2.5000 mg | ORAL_TABLET | Freq: Every day | ORAL | Status: DC

## 2014-01-26 NOTE — Telephone Encounter (Signed)
Medical Oncology  DC from [facility] today after hospitalization 12-25-13 with fever.  LM for patient at home # that we will NOT give last planned taxol on 12-7, so she does not need to take premed steroids 12-6 PM. She will not need gCSF on 12-8. POF done, LM for infusion scheduler re chemo cancelled for 12-7, RN to follow up by phone on 12-7, as patient may need repeat CBC Bmet (note hgb 8.3 in hospital) and may need to be seen by APP or MD on 12-7. If doing well and cultures negative, would add CBC BMET when she comes for scheduled CT on 12-18.  Godfrey Pick, MD

## 2014-01-26 NOTE — Discharge Summary (Signed)
Physician Discharge Summary  Erika Strong CBS:[id number] DOB: 1946-01-07 DOA: 01/24/2014  PCP: Beatrice Lecher, MD  Admit date: 01/24/2014 Discharge date: 01/26/2014  Time spent: >30 minutes  Recommendations for Outpatient Follow-up:  Check BMET to follow electrolytes and renal function Check CBC to follow Hgb and platelets trend Reassess BP and adjust medications as needed  Discharge Diagnoses:  Principal Problem:   SIRS (systemic inflammatory response syndrome) Active Problems:   HLD (hyperlipidemia)   HYPERTENSION, BENIGN   Endometrial cancer   Anemia due to chemotherapy   Hypokalemia   Mild dehydration   Discharge Condition: stable and improved. No further fever. Patient discharge home.  Diet recommendation: low sodium/heart healthy diet  Filed Weights   01/24/14 1227 01/25/14 0400 01/25/14 1531  Weight: 91.3 kg (201 lb 4.5 oz) 91 kg (200 lb 9.9 oz) 97.886 kg (215 lb 12.8 oz)    History of present illness:  68 y.o. female with pmh significant for metastatic endometrial cancer on chemotherapy; HTN, HLD, anemia and thrombocytopenia due to chemotherapy; presented to ED complaining of general malaise and high grade fever for the last 48 hours. No CP, no cough, no dysuria, no melena, no hematochezia, no N/V or any other complaints. Patient last chemotherapy occurred on 01/21/14. UA and CXR neg in ED. WBC's WNL. [facility] called to admit patient for further evaluation and treatment.  Patient BP was soft on admission, she was mild tachycardic and with elevated lactic acid. Admitted for SIRS  Hospital Course:  1-SIRS (systemic inflammatory response syndrome): appears to be secondary to Granix injection and chemotherapy. -received broad spectrum antibiotics until afebrile for 36 hours -cx's remains negative and no signs of infection on UA or CXR -after discussing with Dr. Marin Olp the decision to treat with levaquin and complete a total of 10 days treatment was taken. -patient will  follow with primary oncology and PCP -follow blood cx's and urine cx's -Flu neg -WBC's WNL  2-HLD (hyperlipidemia): with hx of allergic reaction and intolerance to statins -will continue fish oil at discharge  3-HYPERTENSION, BENIGN: stable and rising  -will discontinue chorthalidone  -continue low sodium diet -discharge on amlodipine 2.5mg  daily  4-Endometrial cancer: actively receiving chemotherapy -will follow up with oncology as an outpatient -most recent tx on 01/21/14 -oncology to dictate further treatment and rec's  5-thrombocytopenia and Anemia due to chemotherapy: essentially stable and at baseline for her -no signs of acute bleeding -no need for transfusion; decrease in her Hgb could be 2/2 to hemodilution -will recommend follow Hgb trend at discharge  6-Hypokalemia: due to diuretics -diuretic discontinue -will continue repletion/suplementation as needed -Mg level WNL  7-Mild dehydration: patient reports N/V and decrease intake following chemotherapy -feeling better now and able to eat and drink properly -diuretic has been discontinued -continue PRN antiemetics as previously prescribed -advise to keep herself well hydrated  8-lactic acidosis: due to dehydration most likely; and continue use of diuretics. -patient advise to keep herself well hydrated -lactic acid back to normal limit at discharge  Procedures:  See below for x-ray reports   Consultations:  Oncology   Discharge Exam: Filed Vitals:   01/26/14 0654  BP: 128/59  Pulse: 63  Temp: 98.6 F (37 C)  Resp: 16    General: Feeling better; no cough, no CP or SOB. Afebrile for 36 hours  Cardiovascular: s1 and s2; no rubs or gallops; RRR  Respiratory: CTA bilaterally  Abdomen: soft, NT, ND, positive BS  Musculoskeletal: no edema or cyanosis   Discharge Instructions You were  cared for by a hospitalist during your hospital stay. If you have any questions about your discharge medications or  the care you received while you were in the hospital after you are discharged, you can call the unit and asked to speak with the hospitalist on call if the hospitalist that took care of you is not available. Once you are discharged, your primary care physician will handle any further medical issues. Please note that NO REFILLS for any discharge medications will be authorized once you are discharged, as it is imperative that you return to your primary care physician (or establish a relationship with a primary care physician if you do not have one) for your aftercare needs so that they can reassess your need for medications and monitor your lab values.  Discharge Instructions    Diet - low sodium heart healthy    Complete by:  As directed      Discharge instructions    Complete by:  As directed   Keep yourself well hydrated Take medications as prescribed Finish antibiotics as instructed Follow with PCP in 2 weeks Contact cancer center to confirm next appointment          Current Discharge Medication List    START taking these medications   Details  amLODipine (NORVASC) 2.5 MG tablet Take 1 tablet (2.5 mg total) by mouth daily. Qty: 30 tablet, Refills: 1    levofloxacin (LEVAQUIN) 750 MG tablet Take 1 tablet (750 mg total) by mouth daily. Qty: 9 tablet, Refills: 0      CONTINUE these medications which have CHANGED   Details  potassium chloride 20 MEQ/15ML (10%) SOLN Take 22.5 mLs (30 mEq total) by mouth 3 (three) times daily. Qty: 1000 mL, Refills: 1   Associated Diagnoses: Endometrial cancer; Endometrial sarcoma      CONTINUE these medications which have NOT CHANGED   Details  Ascorbic Acid (VITAMIN C CR) 1000 MG TBCR Take 1,000 mg by mouth daily.    B Complex-C (B-COMPLEX WITH VITAMIN C) tablet Take 1 tablet by mouth daily.    dexamethasone (DECADRON) 4 MG tablet Take 5 tablets with food 12 hrs prior to Taxol Chemotherapy Qty: 5 tablet, Refills: 0   Associated Diagnoses:  Endometrial cancer    LORazepam (ATIVAN) 1 MG tablet 1/2 to 1 tablet every 6 hours as needed for nausea. This will make you drowsy. Can swallow pill or let dissolve under tongue. Night of chemo, whether or not any nausea, take at bedtime Qty: 20 tablet, Refills: 0   Associated Diagnoses: Endometrial cancer; Metastatic cancer to pelvis; Malignant neoplasm of corpus uteri, except isthmus    ondansetron (ZOFRAN) 8 MG tablet 1-2 tablets every 12 hours as needed for nausea. Take 1 tablet the AM after chemo whether or not any nausea. This will not make you drowsy. Qty: 30 tablet, Refills: 0    !! OVER THE COUNTER MEDICATION Take 1 tablet by mouth daily. Curamin    !! OVER THE COUNTER MEDICATION Take 3 tablets by mouth daily. [place] Bone Strength (calcium product)    !! OVER THE COUNTER MEDICATION Take 1 packet by mouth 2 (two) times daily. [place] Fleeting Vitamin Pak (contains fish oil, potassium and multi vitamins)    !! OVER THE COUNTER MEDICATION Take 3 tablets by mouth 3 (three) times daily. Intestinal Sooth and Build    !! OVER THE COUNTER MEDICATION Take 2 tablets by mouth 2 (two) times daily. Stress J    !! OVER THE COUNTER MEDICATION  Take 1 tablet by mouth daily. Liver Care    !! OVER THE COUNTER MEDICATION Take 10 mLs by mouth daily with breakfast. Liquid chlorophyl    !! OVER THE COUNTER MEDICATION Take 15 mLs by mouth daily with breakfast. Flax Seed Oil    PRESCRIPTION MEDICATION Chemo - [facility]    Probiotic Product (PROBIOTIC DAILY) CAPS Take 1 capsule by mouth daily.    Ubiquinol 100 MG CAPS Take 100 mg by mouth daily with breakfast.     acyclovir ointment (ZOVIRAX) 5 % Apply to fever blister 5 times a day Qty: 30 g, Refills: 1   Associated Diagnoses: Fever blister     !! - Potential duplicate medications found. Please discuss with provider.    STOP taking these medications     chlorthalidone (HYGROTON) 50 MG tablet        Allergies  Allergen Reactions  .  Atorvastatin Other (See Comments)     myalgias  . Fish Allergy Nausea And Vomiting  . Lisinopril Other (See Comments)     Cough  . Pravastatin Sodium Other (See Comments)     myalgias  . Simvastatin Other (See Comments)     myalgias   Follow-up Information    Follow up with METHENEY,CATHERINE, MD. Schedule an appointment as soon as possible for a visit in 10 days.   Specialty:  Family Medicine   Contact information:   [address] [place] [address] [phone]       Follow up with Gordy Levan, MD.   Specialty:  Oncology   Why:  cancer center for confirmation on appointment    Contact information:   [place] [address] [phone]       The results of significant diagnostics from this hospitalization (including imaging, microbiology, ancillary and laboratory) are listed below for reference.    Significant Diagnostic Studies: Dg Chest 2 View  01/25/2014   CLINICAL DATA:  History of endometrial malignancy on chemotherapy with development of fever this week  EXAM: CHEST  2 VIEW  COMPARISON:  Portable chest x-ray of January 24, 2014  FINDINGS: The lungs are adequately inflated and clear. The heart and pulmonary vascularity are within the limits of normal. There is no pleural effusion or pneumothorax. The mediastinum is normal in width. There is mild degenerative disc space narrowing of the mid thoracic spine.  IMPRESSION: There is no evidence of pneumonia nor other acute cardiopulmonary abnormality.   Electronically Signed   By: David  Martinique   On: 01/25/2014 10:15   Dg Chest Port 1 View  01/24/2014   CLINICAL DATA:  Fever and chest pain and chills.  EXAM: PORTABLE CHEST - 1 VIEW  COMPARISON:  11/20/2010  FINDINGS: Heart size and pulmonary vascularity are normal and the lungs are clear. No osseous abnormality.  IMPRESSION: Normal chest.   Electronically Signed   By: Rozetta Nunnery M.D.   On: 01/24/2014 08:53    Microbiology: Recent  Results (from the past 240 hour(s))  Blood Culture (routine x 2)     Status: None (Preliminary result)   Collection Time: 01/24/14  8:03 AM  Result Value Ref Range Status   Specimen Description BLOOD RIGHT ARM  Final   Special Requests BOTTLES DRAWN AEROBIC AND ANAEROBIC 5CC  Final   Culture  Setup Time   Final    01/25/2014 07:44 Performed at [facility]   Final  BLOOD CULTURE RECEIVED NO GROWTH TO DATE CULTURE WILL BE HELD FOR 5 DAYS BEFORE ISSUING A FINAL NEGATIVE REPORT Performed at [facility]    Report Status PENDING  Incomplete  Blood Culture (routine x 2)     Status: None (Preliminary result)   Collection Time: 01/24/14  8:07 AM  Result Value Ref Range Status   Specimen Description BLOOD RIGHT HAND  Final   Special Requests BOTTLES DRAWN AEROBIC AND ANAEROBIC 5CC EACH  Final   Culture  Setup Time   Final    01/25/2014 07:45 Performed at [facility]    Culture   Final           BLOOD CULTURE RECEIVED NO GROWTH TO DATE CULTURE WILL BE HELD FOR 5 DAYS BEFORE ISSUING A FINAL NEGATIVE REPORT Performed at [facility]    Report Status PENDING  Incomplete  Urine culture     Status: None   Collection Time: 01/24/14  8:15 AM  Result Value Ref Range Status   Specimen Description URINE, CLEAN CATCH  Final   Special Requests NONE  Final   Culture  Setup Time   Final    01/24/2014 10:16 Performed at [facility] Performed at [facility]   Final   Culture NO GROWTH Performed at [facility]   Final   Report Status 01/25/2014 FINAL  Final  MRSA PCR Screening     Status: None   Collection Time: 01/24/14 12:27 PM  Result Value Ref Range Status   MRSA by PCR NEGATIVE NEGATIVE Final    Comment:        The GeneXpert MRSA Assay (FDA approved for NASAL specimens only), is one component of a comprehensive MRSA colonization surveillance program. It is not intended to  diagnose MRSA infection nor to guide or monitor treatment for MRSA infections.      Labs: Basic Metabolic Panel:  Recent Labs Lab 01/21/14 1158 01/24/14 0803 01/25/14 0437 01/26/14 0514  NA 135* 139 138 140  K 3.9 3.3* 3.4* 4.2  CL  --  95* 100 102  CO2 23 26 27 27   GLUCOSE 184* 91 100* 92  BUN 13.2 16 10 11   CREATININE 0.8 0.66 0.79 0.78  CALCIUM 9.8 9.3 8.1* 9.0  MG  --   --  1.8 1.8  PHOS  --   --   --  3.8   Liver Function Tests:  Recent Labs Lab 01/21/14 1158 01/24/14 0803 01/25/14 0437  AST 26 67* 47*  ALT 34 56* 68*  ALKPHOS 61 60 40  BILITOT 0.40 0.7 0.3  PROT 6.8 6.3 5.2*  ALBUMIN 3.8 3.5 2.7*   CBC:  Recent Labs Lab 01/21/14 1158 01/24/14 0803 01/25/14 0437  WBC 5.7 5.1 4.6  NEUTROABS 4.3 4.3  --   HGB 10.9* 9.7* 8.3*  HCT 32.3* 28.8* 24.9*  MCV 96.1 97.3 98.4  PLT 154 115* 95*    Signed:  Barton Dubois  [facility] 01/26/2014, 10:50 AM

## 2014-01-28 ENCOUNTER — Other Ambulatory Visit: Payer: Medicare Other

## 2014-01-28 ENCOUNTER — Ambulatory Visit: Payer: Medicare Other

## 2014-01-28 ENCOUNTER — Telehealth: Payer: Self-pay

## 2014-01-28 NOTE — Telephone Encounter (Signed)
Erika Strong is doing well.  She is afebrile. She is decorating the Christmas tree and house.  She has to sit and rest at times as she gets SOB with exertion.  Told her to call the office if her SOB gets worse or she experiences it at rest. Told her that the urine culture from 01-24-14 was negative for infection. The blood cultures done on 01-24-14 are negative for any growth thus far. Gave [name] a lab appointment for 02-08-14 at 1045 per Dr. Marko Plume as [name] is doing well and will recheck labs on 02-08-14.   Will forward note to Dr. Marko Plume to determine if a follow up appointment is needed after Dr. Leone Brand appointment on 02-11-14.

## 2014-01-29 ENCOUNTER — Ambulatory Visit: Payer: Medicare Other

## 2014-01-29 ENCOUNTER — Telehealth: Payer: Self-pay | Admitting: *Deleted

## 2014-01-29 ENCOUNTER — Telehealth: Payer: Self-pay | Admitting: Oncology

## 2014-01-29 ENCOUNTER — Other Ambulatory Visit: Payer: Self-pay | Admitting: Oncology

## 2014-01-29 DIAGNOSIS — C541 Malignant neoplasm of endometrium: Secondary | ICD-10-CM

## 2014-01-29 MED ORDER — FERROUS FUMARATE 325 (106 FE) MG PO TABS: ORAL_TABLET | ORAL | Status: DC

## 2014-01-29 NOTE — Telephone Encounter (Signed)
-----   Message from Gordy Levan, MD sent at 01/29/2014 11:04 AM EST ----- Regarding: RE: Iron Supplement Hemocyte if her insurance will cover, or OTC ferrous fumarate or ferrous gluconate:   ~ 325 mg daily on empty stomach with OJ. (Not ferrous sulfate or any extended release iron preparations) thanks ----- Message -----    From: Christa See, RN    Sent: 01/29/2014  10:47 AM      To: Gordy Levan, MD Subject: Iron Supplement                                Pt called asking if she could take an iron supplement to help her hemoglobin? CBC 12/4 - Hgb 8.3  Didn't know if you wanted her to take OTC iron or send it prescription for one? Thanks! Cyril Mourning

## 2014-01-29 NOTE — Telephone Encounter (Signed)
Patient called asking about taking an iron supplement to help with her hemoglobin level. Per Dr. Marko Plume, prescription below sent to patient's pharmacy. Called pt and let her know this - she is agreeable to pick up prescription.

## 2014-01-31 LAB — CULTURE, BLOOD (ROUTINE X 2)
Culture: NO GROWTH
Culture: NO GROWTH

## 2014-02-08 ENCOUNTER — Telehealth: Payer: Self-pay | Admitting: *Deleted

## 2014-02-08 ENCOUNTER — Encounter (HOSPITAL_COMMUNITY): Payer: Self-pay

## 2014-02-08 ENCOUNTER — Other Ambulatory Visit (HOSPITAL_BASED_OUTPATIENT_CLINIC_OR_DEPARTMENT_OTHER): Payer: Medicare Other

## 2014-02-08 ENCOUNTER — Telehealth: Payer: Self-pay | Admitting: Gynecologic Oncology

## 2014-02-08 ENCOUNTER — Ambulatory Visit (HOSPITAL_COMMUNITY)
Admission: RE | Admit: 2014-02-08 | Discharge: 2014-02-08 | Disposition: A | Discharge status: Home / Self Care | Payer: Medicare Other | Source: Ambulatory Visit | Attending: Oncology | Admitting: Oncology

## 2014-02-08 DIAGNOSIS — C541 Malignant neoplasm of endometrium: Secondary | ICD-10-CM | POA: Insufficient documentation

## 2014-02-08 DIAGNOSIS — K5730 Diverticulosis of large intestine without perforation or abscess without bleeding: Secondary | ICD-10-CM | POA: Diagnosis not present

## 2014-02-08 DIAGNOSIS — K760 Fatty (change of) liver, not elsewhere classified: Secondary | ICD-10-CM | POA: Insufficient documentation

## 2014-02-08 DIAGNOSIS — Z90710 Acquired absence of both cervix and uterus: Secondary | ICD-10-CM | POA: Diagnosis not present

## 2014-02-08 DIAGNOSIS — K449 Diaphragmatic hernia without obstruction or gangrene: Secondary | ICD-10-CM | POA: Insufficient documentation

## 2014-02-08 DIAGNOSIS — I700 Atherosclerosis of aorta: Secondary | ICD-10-CM | POA: Diagnosis not present

## 2014-02-08 LAB — CBC WITH DIFFERENTIAL/PLATELET
BASO%: 0.2 % (ref 0.0–2.0)
Basophils Absolute: 0 10*3/uL (ref 0.0–0.1)
EOS%: 0.5 % (ref 0.0–7.0)
Eosinophils Absolute: 0 10*3/uL (ref 0.0–0.5)
HCT: 34.6 % — ABNORMAL LOW (ref 34.8–46.6)
HEMOGLOBIN: 11.1 g/dL — AB (ref 11.6–15.9)
LYMPH#: 2.3 10*3/uL (ref 0.9–3.3)
LYMPH%: 36.2 % (ref 14.0–49.7)
MCH: 32.5 pg (ref 25.1–34.0)
MCHC: 32.1 g/dL (ref 31.5–36.0)
MCV: 101.2 fL — ABNORMAL HIGH (ref 79.5–101.0)
MONO#: 0.9 10*3/uL (ref 0.1–0.9)
MONO%: 14.5 % — AB (ref 0.0–14.0)
NEUT%: 48.6 % (ref 38.4–76.8)
NEUTROS ABS: 3.1 10*3/uL (ref 1.5–6.5)
Platelets: 136 10*3/uL — ABNORMAL LOW (ref 145–400)
RBC: 3.42 10*6/uL — ABNORMAL LOW (ref 3.70–5.45)
RDW: 20 % — AB (ref 11.2–14.5)
WBC: 6.3 10*3/uL (ref 3.9–10.3)

## 2014-02-08 LAB — COMPREHENSIVE METABOLIC PANEL (CC13)
ALK PHOS: 54 U/L (ref 40–150)
ALT: 23 U/L (ref 0–55)
ANION GAP: 11 meq/L (ref 3–11)
AST: 23 U/L (ref 5–34)
Albumin: 3.7 g/dL (ref 3.5–5.0)
BILIRUBIN TOTAL: 0.54 mg/dL (ref 0.20–1.20)
BUN: 9.7 mg/dL (ref 7.0–26.0)
CO2: 26 meq/L (ref 22–29)
CREATININE: 0.8 mg/dL (ref 0.6–1.1)
Calcium: 9.2 mg/dL (ref 8.4–10.4)
Chloride: 102 mEq/L (ref 98–109)
EGFR: 81 mL/min/{1.73_m2} — AB (ref >90)
Glucose: 108 mg/dl (ref 70–140)
Potassium: 3.7 mEq/L (ref 3.5–5.1)
SODIUM: 139 meq/L (ref 136–145)
TOTAL PROTEIN: 6.5 g/dL (ref 6.4–8.3)

## 2014-02-08 MED ORDER — IOHEXOL 300 MG/ML  SOLN
50.0000 mL | Freq: Once | INTRAMUSCULAR | Status: AC | PRN
  Administered 2014-02-08: 50 mL via ORAL

## 2014-02-08 MED ORDER — IOHEXOL 300 MG/ML  SOLN
100.0000 mL | Freq: Once | INTRAMUSCULAR | Status: AC | PRN
  Administered 2014-02-08: 100 mL via INTRAVENOUS

## 2014-02-08 NOTE — Telephone Encounter (Signed)
Called pt and left her a voicemail with results of labs - saw pt in lobby this morning and she already received a copy of the CBC done from the lab. Let her know her potassium was 3.7 today. Per Dr. Marko Plume, she needs to continue same dose of potassium supplement. Told her to please call us back with any further questions or concerns.

## 2014-02-08 NOTE — Telephone Encounter (Signed)
Office Visit:  GYN ONCOLOGY  CC:  Recurrent endometrial cancer  Assessment/Plan:  Erika Strong is a 68 y.o. stage IA grade 1 endometrial adenocarcinoma staged 11/2010 now with multifocal recurrence identified 04/2013.  She has received 9 cycles taxol/carboplatin and CT 01/2014 with significant improvement but persistent disease is noted on imaging.    Response to chemotherapy is not complete. Consideration for change of chemotherapy to doxil  COLONSOCOPY???? Would not recommend radiotherapy because of the intraperitoneal disease. Follow-up after cycle 9 taxol/carbo Discussion with the patient and her husband about the options in addition to review of the imaging lasted 25 minutes.   HPI: ,Erika Strong is a 68 y.o. gravida 1, para 1 was in her usual state of health until early June 2012, and she noted light vaginal spotting, this spotting persisted and she sought the advice of Dr. Madilyn Fireman, who directly facilitated referral to Dr. Clovia Cuff for evaluation. A pelvic ultrasound was obtained and was notable for uterus measuring 6.4 x 2.7 x 4.8 cm. A fibroid was noted in the anterior uterine segment and left fundus. The endometrium was noted to measure 13.4 mm in thickness. Otherwise, the findings were unremarkable. An endometrial biopsy was obtained on October 21, 2010, and demonstrated endometrioid type grade 1 adenocarcinoma focally involving a polyp.   On 11/24/2010 she underwent [place] BSO RPLND LPLNS Pathology endometrial adenocarcinoma with focal superficial myometrial invasion. A invasion was 0.2 cm where the myometrium is 1.4 cm there was no lymph vascular space invasion 0 of 20 lymph nodes were involved with tumor.  Interval History: Patient reported a recent episode of lower abdominal pain c/w diverticulitis in early 2015. She was Rx with a 10 day course of antibiotics with resolution of the abdominal pain. Imaging was obtained  05/22/2013 which demonstrated In the pelvis, there is a mass in the  left anterior upper pelvis measuring 4.1 x 4.1 cm which does not connect to bowel. Uterus is absent. There is a lymph node lateral to the rectum on the right measuring 2.1 x 1.7 cm, consistent with adenopathy. The urinary bladder is midline with normal wall thickness. Uterus is absent  There is wall thickening throughout much of the sigmoid colon with mucosal irregularity consistent with diverticulitis. There is no abscess. There is no bowel obstruction. No free air or portal venous air.  There is no ascites or abscess in the abdomen or pelvis. No other adenopathy is seen beyond that described lateral to the rectum on the right.  Bx mesenteric mass 05/31/2013 Mesentery, ABD mesenteric mass - METASTATIC ADENOCARCINOMA, CONSISTENT WITH ENDOMETRIOID ADENOCARCINOMA.  Erika Strong has received 6 cycles taxol/carboplatin as of 09/2013.   CT 11/15/2013 IMPRESSION:  1. Interval response to therapy. The dominant ventral peritoneal  mass has decreased in size from previous exam.  2. Small right pelvic sidewall nodule is also decreased in size over the interval. The serosal implant along the sigmoid colon is no longer measurable.  She received an additional 3 cycles of taxol/carboplatin  CT 02/08/2014 IMPRESSION: 1. Further reduction in size of the omental mass, indicating response to therapy. 2. Sigmoid colon wall thickening may reflect colitis, but there is a focal region of wall thickening near the rectosigmoid junction which could represent colon cancer or a distal sigmoid colon implant of tumor (see image 69, series 2). 3. Ancillary findings include mild diffuse hepatic steatosis and a small type 1 hiatal hernia.   Last colonoscopy:::   Review of Systems  Constitutional  Feels well  Cardiovascular  No chest pain, shortness of breath, or edema  Pulmonary  No cough or wheeze.  Gastro Intestinal  No nausea, vomitting, or diarrhoea. No bright red blood per rectum, no abdominal pain Genito  Urinary  No frequency, urgency, dysuria, no vaginal bleeding. Musculo Skeletal  No myalgia, arthralgia, joint swelling or pain  Neurologic  No weakness, numbness, change in gait,    Past Medical Hx:  Past Medical History  Diagnosis Date  . Wears glasses   . Hypertension   . Diverticulitis 2010  . Hyperlipidemia     2007  . Cancer   . Endometrial cancer 11/02/2010  . Malignant neoplasm of corpus uteri, except isthmus 01/21/2011  . Anemia due to chemotherapy 01/23/2014   Past Surgical History  Procedure Laterality Date  . Gonadectomy and hysterectomy  11/24/2010    Endometrial cancer, performed by Dr. Janie Morning  . Mole removal  Nov 2012  . Abdominal hysterectomy  11/24/2010    RTLH, BSO, RPLND, LPLNS   History   Social History Narrative   Walks daily for exercise.       Vitals: BP 148/72  Pulse 72  Temp(Src) 98.7 F (37.1 C) (Oral)  Resp 18  Ht 5\' 7"  (1.702 m)  Wt 207 lb 3.2 oz (93.985 kg)  BMI 32.44 kg/m2  Physical Exam:  WD female in NAD A

## 2014-02-11 ENCOUNTER — Ambulatory Visit: Payer: Medicare Other | Attending: Gynecologic Oncology | Admitting: Gynecologic Oncology

## 2014-02-11 ENCOUNTER — Encounter: Payer: Self-pay | Admitting: Gynecologic Oncology

## 2014-02-11 VITALS — BP 133/76 | HR 91 | Temp 98.3°F | Resp 16 | Ht 66.0 in | Wt 217.9 lb

## 2014-02-11 DIAGNOSIS — C541 Malignant neoplasm of endometrium: Secondary | ICD-10-CM

## 2014-02-11 NOTE — Addendum Note (Signed)
Addended by: Joylene John D on: 02/11/2014 04:30 PM   Modules accepted: Orders

## 2014-02-11 NOTE — Patient Instructions (Signed)
Plan to follow up after you receive three cycles of Doxil and have a PET scan.  Please call for any questions or concerns.  Doxorubicin Liposomal injection What is this medicine? LIPOSOMAL DOXORUBICIN (LIP oh som al dox oh ROO bi sin) is a chemotherapy drug. This medicine is used to treat many kinds of cancer like Kaposi's sarcoma, multiple myeloma, and ovarian cancer. This medicine may be used for other purposes; ask your health care provider or pharmacist if you have questions. COMMON BRAND NAME(S): Doxil, Lipodox What should I tell my health care provider before I take this medicine? They need to know if you have any of these conditions: -blood disorders -heart disease -infection (especially a virus infection such as chickenpox, cold sores, or herpes) -liver disease -recent or ongoing radiation therapy -an unusual or allergic reaction to doxorubicin, other chemotherapy agents, soybeans, other medicines, foods, dyes, or preservatives -pregnant or trying to get pregnant -breast-feeding How should I use this medicine? This drug is given as an infusion into a vein. It is administered in a hospital or clinic by a specially trained health care professional. If you have pain, swelling, burning or any unusual feeling around the site of your injection, tell your health care professional right away. Talk to your pediatrician regarding the use of this medicine in children. Special care may be needed. Overdosage: If you think you have taken too much of this medicine contact a poison control center or emergency room at once. NOTE: This medicine is only for you. Do not share this medicine with others. What if I miss a dose? It is important not to miss your dose. Call your doctor or health care professional if you are unable to keep an appointment. What may interact with this medicine? Do not take this medicine with any of the following medications: -zidovudine This medicine may also interact with the  following medications: -medicines to increase blood counts like filgrastim, pegfilgrastim, sargramostim -vaccines Talk to your doctor or health care professional before taking any of these medicines: -acetaminophen -aspirin -ibuprofen -ketoprofen -naproxen This list may not describe all possible interactions. Give your health care provider a list of all the medicines, herbs, non-prescription drugs, or dietary supplements you use. Also tell them if you smoke, drink alcohol, or use illegal drugs. Some items may interact with your medicine. What should I watch for while using this medicine? Your condition will be monitored carefully while you are receiving this medicine. You will need important blood work done while you are taking this medicine. This drug may make you feel generally unwell. This is not uncommon, as chemotherapy can affect healthy cells as well as cancer cells. Report any side effects. Continue your course of treatment even though you feel ill unless your doctor tells you to stop. Your urine may turn orange-red for a few days after your dose. This is not blood. If your urine is dark or brown, call your doctor. In some cases, you may be given additional medicines to help with side effects. Follow all directions for their use. Call your doctor or health care professional for advice if you get a fever (100.5 degrees F or higher), chills or sore throat, or other symptoms of a cold or flu. Do not treat yourself. This drug decreases your body's ability to fight infections. Try to avoid being around people who are sick. This medicine may increase your risk to bruise or bleed. Call your doctor or health care professional if you notice any unusual bleeding. Be  careful brushing and flossing your teeth or using a toothpick because you may get an infection or bleed more easily. If you have any dental work done, tell your dentist you are receiving this medicine. Avoid taking products that contain  aspirin, acetaminophen, ibuprofen, naproxen, or ketoprofen unless instructed by your doctor. These medicines may hide a fever. Men and women of childbearing age should use effective birth control methods while using taking this medicine. Do not become pregnant while taking this medicine. There is a potential for serious side effects to an unborn child. Talk to your health care professional or pharmacist for more information. Do not breast-feed an infant while taking this medicine. Talk to your doctor about your risk of cancer. You may be more at risk for certain types of cancers if you take this medicine. What side effects may I notice from receiving this medicine? Side effects that you should report to your doctor or health care professional as soon as possible: -allergic reactions like skin rash, itching or hives, swelling of the face, lips, or tongue -low blood counts - this medicine may decrease the number of white blood cells, red blood cells and platelets. You may be at increased risk for infections and bleeding. -signs of hand-foot syndrome - tingling or burning, redness, flaking, swelling, small blisters, or small sores on the palms of your hands or the soles of your feet -signs of infection - fever or chills, cough, sore throat, pain or difficulty passing urine -signs of decreased platelets or bleeding - bruising, pinpoint red spots on the skin, black, tarry stools, blood in the urine -signs of decreased red blood cells - unusually weak or tired, fainting spells, lightheadedness -back pain, chills, facial flushing, fever, headache, tightness in the chest or throat during the infusion -breathing problems -chest pain -fast, irregular heartbeat -mouth pain, redness, sores -pain, swelling, redness at site where injected -pain, tingling, numbness in the hands or feet -swelling of ankles, feet, or hands -vomiting Side effects that usually do not require medical attention (report to your doctor  or health care professional if they continue or are bothersome): -diarrhea -hair loss -loss of appetite -nail discoloration or damage -nausea -red or watery eyes -red colored urine -stomach upset This list may not describe all possible side effects. Call your doctor for medical advice about side effects. You may report side effects to FDA at 1-800-FDA-1088. Where should I keep my medicine? This drug is given in a hospital or clinic and will not be stored at home. NOTE: This sheet is a summary. It may not cover all possible information. If you have questions about this medicine, talk to your doctor, pharmacist, or health care provider.  2015, [employer]vier/Gold Standard. (2011-10-29 10:12:56)

## 2014-02-11 NOTE — Progress Notes (Signed)
GYN ONCOLOGY OFFICE VISIT   Chief Complaint:  Recurrent endometrial cancer  Assessment/Plan:  [name] is a 68 y.o. stage IA grade 1 endometrial adenocarcinoma staged 11/2010 now with multifocal recurrence identified 04/2013.  She has received 9 cycles taxol/carboplatin and CT 01/2014 with significant improvement but persistent disease is noted on imaging.    Response to chemotherapy is not complete.  Given the significant number of prior treatments of platin will switch to Doxil. MUGA or ECHO for baseline cardiac assessment PET after three  Cycles doxil. If disease resolves after the next few cycles will consider placing on Megace or aromatase inhibitor given the slow response to chemotherapy Will send tumor for ER/RP   Follow-up with Dr. Marko Plume as scheduled.    HPI: ,[name] is a 68 y.o.  gravida 1, para 1 was in her usual state of health until early June 2012, and she noted light vaginal spotting, this spotting persisted and she sought the advice of Dr. Madilyn Fireman, who directly facilitated referral to Dr. Clovia Cuff for evaluation. A pelvic ultrasound was obtained and was notable for uterus measuring 6.4 x 2.7 x 4.8 cm. A fibroid was noted in the anterior uterine segment and left fundus. The endometrium was noted to measure 13.4 mm in thickness. Otherwise, the findings were unremarkable. An endometrial biopsy was obtained on October 21, 2010, and demonstrated endometrioid type grade 1 adenocarcinoma focally involving a polyp.   On 11/24/2010 she underwent [place] BSO RPLND LPLNS Pathology endometrial adenocarcinoma with focal superficial myometrial invasion. A invasion was 0.2 cm where the myometrium is 1.4 cm there was no lymph vascular space invasion 0 of 20 lymph nodes were involved with tumor.  Interval History: Patient reported a recent episode of lower abdominal pain c/w diverticulitis in early 2015. She was Rx with a 10 day course of antibiotics with resolution of the abdominal pain.  Imaging was obtained  05/22/2013 which demonstrated In the pelvis, there is a mass in the left anterior upper pelvis measuring 4.1 x 4.1 cm which does not connect to bowel. Uterus is absent. There is a lymph node lateral to the rectum on the right measuring 2.1 x 1.7 cm, consistent with adenopathy. The urinary bladder is midline with normal wall thickness. Uterus is absent  There is wall thickening throughout much of the sigmoid colon with mucosal irregularity consistent with diverticulitis. There is no abscess. There is no bowel obstruction. No free air or portal venous air.  There is no ascites or abscess in the abdomen or pelvis. No other adenopathy is seen beyond that described lateral to the rectum on the right.  Bx mesenteric mass 05/31/2013 Mesentery, ABD mesenteric mass - METASTATIC ADENOCARCINOMA, CONSISTENT WITH ENDOMETRIOID ADENOCARCINOMA.  Erika Strong has received 6 cycles taxol/carboplatin as of 09/2013.   CT 11/15/2013 IMPRESSION:  1. Interval response to therapy. The dominant ventral peritoneal  mass has decreased in size from previous exam.  2. Small right pelvic sidewall nodule is also decreased in size over the interval. The serosal implant along the sigmoid colon is no longer measurable.  She received an additional 3 cycles of taxol/carboplatin  CT 02/08/2014 IMPRESSION: 1. Further reduction in size of the omental mass, indicating response to therapy. 2. Sigmoid colon wall thickening may reflect colitis, but there is a focal region of wall thickening near the rectosigmoid junction which could represent colon cancer or a distal sigmoid colon implant of tumor (see image 69, series 2). 3. Ancillary findings include mild diffuse hepatic steatosis and  a small type 1 hiatal hernia.   Last cycle of chemotherapy associated with a severe allergic reaction to CSF that required admission to the ICU.    Review of Systems  Constitutional  Feels well  Cardiovascular  No chest pain,  shortness of breath, or edema  Pulmonary  No cough or wheeze.  Gastro Intestinal  No nausea, vomitting, or diarrhoea. No bright red blood per rectum, no abdominal pain Genito Urinary  No frequency, urgency, dysuria, no vaginal bleeding. Musculo Skeletal  No myalgia, arthralgia, joint swelling or pain  Neurologic  No weakness, numbness, change in gait,   Past Medical History  Diagnosis Date  . Wears glasses   . Hypertension   . Diverticulitis 2010  . Hyperlipidemia     2007  . Cancer   . Endometrial cancer 11/02/2010  . Malignant neoplasm of corpus uteri, except isthmus 01/21/2011  . Anemia due to chemotherapy 01/23/2014   Past Surgical History  Procedure Laterality Date  . Gonadectomy and hysterectomy  11/24/2010    Endometrial cancer, performed by Dr. Janie Morning  . Mole removal  Nov 2012  . Abdominal hysterectomy  11/24/2010    RTLH, BSO, RPLND, LPLNS  Colonoscopy 2012 wnl  REVIEW OF SYSTEMS Constitutional  Feels well  Skin/Breast  No rash, sores, jaundice, itching,   Cardiovascular  No chest pain, shortness of breath, or edema  Pulmonary  No cough or wheeze.  Gastro Intestinal  No nausea, vomitting, or diarrhoea. No bright red blood per rectum, no abdominal pain, change in bowel movement, or constipation.  Genito Urinary  No frequency, urgency, dysuria,  Musculo Skeletal  No myalgia, arthralgia, joint swelling or pain  Neurologic  No weakness, numbness, change in gait,  Psychology  No depression, anxiety, insomnia.    PHYSICAL EXAMINATION Vitals BP 133/76 mmHg  Pulse 91  Temp(Src) 98.3 F (36.8 C) (Oral)  Resp 16  Ht 5\' 6"  (1.676 m)  Wt 217 lb 14.4 oz (98.839 kg)  BMI 35.19 kg/m2 Neck  Supple without any enlargements.  Cardiovascular  Pulse normal rate, regularity and rhythm Lungs  Clear to auscultation bilateraly, Skin  Red blotchy hue of the face and neck. Psychiatry  Alert and oriented to person, place, and time  Abdomen  Normoactive  bowel sounds, abdomen soft, non-tender and obese. Surgical  sites intact without evidence of hernia.  Genito Urinary  Vulva/vagina: Normal external female genitalia.  No lesions. No discharge or bleeding.no cul de sac masses Rectal  Good tone, no masses no cul de sac nodularity. External hemorrhoids non tender. Extremities  No bilateral cyanosis, clubbing or edema. No rash, lesions or petiche.

## 2014-02-12 ENCOUNTER — Other Ambulatory Visit: Payer: Self-pay | Admitting: *Deleted

## 2014-02-12 ENCOUNTER — Ambulatory Visit (INDEPENDENT_AMBULATORY_CARE_PROVIDER_SITE_OTHER): Payer: Medicare Other | Admitting: Family Medicine

## 2014-02-12 ENCOUNTER — Encounter: Payer: Self-pay | Admitting: Family Medicine

## 2014-02-12 VITALS — BP 140/82 | HR 85 | Temp 98.2°F | Ht 66.0 in | Wt 216.0 lb

## 2014-02-12 DIAGNOSIS — K760 Fatty (change of) liver, not elsewhere classified: Secondary | ICD-10-CM

## 2014-02-12 DIAGNOSIS — C7989 Secondary malignant neoplasm of other specified sites: Secondary | ICD-10-CM

## 2014-02-12 DIAGNOSIS — I10 Essential (primary) hypertension: Secondary | ICD-10-CM

## 2014-02-12 DIAGNOSIS — C541 Malignant neoplasm of endometrium: Secondary | ICD-10-CM

## 2014-02-12 HISTORY — DX: Fatty (change of) liver, not elsewhere classified: K76.0

## 2014-02-12 NOTE — Progress Notes (Addendum)
   Subjective:    Patient ID: Erika Strong, female    DOB: 1945/09/01, 68 y.o.   MRN: [id number]  HPI Hypertension- Pt denies chest pain, SOB, dizziness, or heart palpitations.  Taking meds as directed w/o problems.  Denies medication side effects.  Currently on Norvasc 2.5 mg daily.  Started during recent hosp admit for possible SIRS. Feel swell in ankles a little at night.  Home BPS running 120-130.  Highest was 141. Was on chlorthalidone, but stopped when admitted to hospital for hypovolumia and low BP.   Has been undergoing treatment for metastatic cancer to the pelvis, original was endometrial cancer. 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 But overall she is doing well. She has gained some weight, approximately 9 pounds. She has been stress eating she admits.  Review of Systems     Objective:   Physical Exam  Constitutional: She is oriented to person, place, and time. She appears well-developed and well-nourished.  HENT:  Head: Normocephalic and atraumatic.  Cardiovascular: Normal rate, regular rhythm and normal heart sounds.   Pulmonary/Chest: Effort normal and breath sounds normal.  Musculoskeletal:  Trace ankle edema bilaterally.   Neurological: She is alert and oriented to person, place, and time.  Skin: Skin is warm and dry.  Psychiatric: She has a normal mood and affect. Her behavior is normal.        Assessment & Plan:  HTN- well controlled on current regimen. Monitor for worsening of ankle swelling .  Seems to be mild, will monitor. Follow up in 6 months. Last kidney function was 4 weeks ago and look fantastic with creatinine of 0.8.  She is due for lipids.  F/U in 6 months.   Metastatic endometrial cancer - she under going chem for at least 3 months. Overall she is doing well. Plans on working at [employer] some again. She did retire form teaching.   Has had flu vaccine this year.

## 2014-02-13 LAB — LIPID PANEL
CHOL/HDL RATIO: 4.4 ratio
Cholesterol: 249 mg/dL — ABNORMAL HIGH (ref 0–200)
HDL: 56 mg/dL (ref >39)
LDL Cholesterol: 155 mg/dL — ABNORMAL HIGH (ref 0–99)
TRIGLYCERIDES: 192 mg/dL — AB (ref <150)
VLDL: 38 mg/dL (ref 0–40)

## 2014-02-19 ENCOUNTER — Encounter: Payer: Self-pay | Admitting: *Deleted

## 2014-02-19 ENCOUNTER — Telehealth: Payer: Self-pay | Admitting: *Deleted

## 2014-02-19 NOTE — Telephone Encounter (Signed)
Faxed letter signed by Joylene John, NP to Dr. Shon Hough office that it is okay to proceed with dental procedure this afternoon. Called and confirmed office recieved letter and called patient as well to let her know this was done.

## 2014-02-19 NOTE — Telephone Encounter (Signed)
Notified pt of Future appointment on 02/20/14 @ 9:00 am at [facility] for 2D echo. Pt agreed with time and date

## 2014-02-20 ENCOUNTER — Ambulatory Visit (HOSPITAL_COMMUNITY)
Admission: RE | Admit: 2014-02-20 | Discharge: 2014-02-20 | Disposition: A | Discharge status: Home / Self Care | Payer: Medicare Other | Source: Ambulatory Visit | Attending: Oncology | Admitting: Oncology

## 2014-02-20 DIAGNOSIS — C541 Malignant neoplasm of endometrium: Secondary | ICD-10-CM | POA: Diagnosis not present

## 2014-02-20 DIAGNOSIS — I10 Essential (primary) hypertension: Secondary | ICD-10-CM | POA: Insufficient documentation

## 2014-02-20 DIAGNOSIS — Z01818 Encounter for other preprocedural examination: Secondary | ICD-10-CM | POA: Diagnosis not present

## 2014-02-20 DIAGNOSIS — I071 Rheumatic tricuspid insufficiency: Secondary | ICD-10-CM | POA: Diagnosis not present

## 2014-02-20 NOTE — Progress Notes (Signed)
Echocardiogram 2D Echocardiogram has been performed.  Erika Strong 02/20/2014, 9:44 AM

## 2014-02-22 ENCOUNTER — Other Ambulatory Visit: Payer: Self-pay | Admitting: Oncology

## 2014-02-24 ENCOUNTER — Other Ambulatory Visit: Payer: Self-pay | Admitting: Oncology

## 2014-02-24 DIAGNOSIS — C541 Malignant neoplasm of endometrium: Secondary | ICD-10-CM

## 2014-02-27 ENCOUNTER — Telehealth: Payer: Self-pay

## 2014-02-27 NOTE — Telephone Encounter (Signed)
Patient Demographics     Patient Name Sex DOB SSN Address Phone    Erika Strong, Erika Strong Female [birth year] [id number] [place] [address] [phone] (Home) [phone] (Mobile) *Preferred*      Message  Received: 5 days ago    Gordy Levan, MD  Baruch Merl, RN; Christa See, RN           #2 message, first re echocardiogram result and doxil teaching by phone.  Apt LL 1-7  She really should have PAC. Please discuss this also when you call her - thanks

## 2014-02-27 NOTE — Telephone Encounter (Signed)
-----   Message from Gordy Levan, MD sent at 02/24/2014  2:46 PM EST ----- Needs doxil teaching either by phone before I see her on 1-7 or at visit  Will need B6 with doxil  thanks

## 2014-02-27 NOTE — Telephone Encounter (Signed)
Gave Erika Strong the results of the Echocardiogram as noted below  by Dr. Marko Plume. Reviewed The  Doxil Chemotherapy side effects and the recommendations for side effect management. Erika Strong verbalized understanding.  Will give her the written information at her appointment tomorrow with Dr. Marko Plume. Suggested PAC placement and reviewed benefits especially with Doxil is an extravasate.  Erika Strong will discuss [facility] placement with Dr. Marko Plume tomorrow as she really does not want it as she was going to have three Doxil treatments and then a PET Scan. Erika Strong takes a B Complex vitamin daily.  She will bring the bottle tomorrow to determine if the B6  dose is adequate to help prevent the Hand-Foot Syndrome other wise She can takea  B6 supplement 50-100 mg bid.

## 2014-02-27 NOTE — Telephone Encounter (Signed)
Erika Strong - 02/20/14 >','<< Less Detail',event)" href="javascript:;">More Detail >>   Gordy Levan, MD   Sent: Fri February 22, 2014 8:21 AM    To: Baruch Merl, RN    Cc: Christa See, RN        Result Note     Labs seen and need follow up: please let her know echocardiogram is fine to use doxil chemotherapy. She is to see me on 1-7. RN please do doxil teaching by phone prior to that appointment, and will need to give written information/ review at appointment. thanks     Attached to     2D ECHOCARDIOGRAM WITHOUT CONTRAST (Order# [id number])      2D Echocardiogram without contrast  Status: Finalresult Visible to patient:  MyChart Nextappt: 02/28/2014 at 01:00 PM in Oncology ([facility]) Dx:  Endometrial cancer       Notes Recorded by Gordy Levan, MD on 02/22/2014 at [place] seen and need follow up: please let her know echocardiogram is fine to use doxil chemotherapy. She is to see me on 1-7. RN please do doxil teaching by phone prior to that appointment, and will need to give written information/ review at appointment. thanks   Narrative                  *[facility] [employer].            [place], [place] [address]              [phone]  ------------------------------------------------------------------- Transthoracic Echocardiography  Patient:  Erika, Strong MR #:    [id number] Study Date: 02/20/2014 Gender:   F Age:    69 Height:   170.2 cm Weight:   97.5 kg BSA:    2.18 m^2 Pt. Status: Room:  ATTENDING  Mindi Junker, [place] P REFERRING  [place], [place] P SONOGRAPHER Tresa Res, RDCS PERFORMING  [facility], Outpatient  cc:  ------------------------------------------------------------------- LV EF: 55% -   60%  ------------------------------------------------------------------- History:  PMH: Pre-op evaluation for chemotherapy. Endometrial Cancer. Risk factors: Hypertension.  ------------------------------------------------------------------- Study Conclusions  - Left ventricle: Global LV strain is -17% The cavity size was normal. There was mild focal basal hypertrophy of the septum. Systolic function was normal. The estimated ejection fraction was in the range of 55% to 60%. Wall motion was normal; there were no regional wall motion abnormalities. There was an increased relative contribution of atrial contraction to ventricular filling. Doppler parameters are consistent with abnormal left ventricular relaxation (grade 1 diastolic dysfunction). - Mitral valve: Peak A-wave velocity: 105 cm/s. - Tricuspid valve: There was trivial regurgitation.  Transthoracic echocardiography. M-mode, complete 2D, spectral Doppler, and color Doppler. Birthdate: Patient birthdate: April 04, 1945. Age: Patient is 69 yr old. Sex: Gender: female. BMI: 33.7 kg/m^2. Blood pressure:   138/82 Patient status: Outpatient. Study date: Study date: 02/20/2014. Study time: 09:08 AM. Location: Echo laboratory.  -------------------------------------------------------------------  ------------------------------------------------------------------- Left ventricle: Global LV strain is -17% The cavity size was normal. There was mild focal basal hypertrophy of the septum. Systolic function was normal. The estimated ejection fraction was in the range of 55% to 60%. Wall motion was normal; there were no regional wall motion abnormalities. There was an increased relative contribution of atrial contraction to ventricular filling. Doppler parameters are consistent with abnormal left ventricular relaxation (  grade 1 diastolic  dysfunction).  ------------------------------------------------------------------- Aortic valve:  Trileaflet; normal thickness leaflets. Mobility was not restricted. Doppler: Transvalvular velocity was within the normal range. There was no stenosis. There was no regurgitation.  ------------------------------------------------------------------- Aorta: Aortic root: The aortic root was normal in size.  ------------------------------------------------------------------- Mitral valve:  Structurally normal valve.  Mobility was not restricted. Doppler: Transvalvular velocity was within the normal range. There was no evidence for stenosis. There was trivial regurgitation.  ------------------------------------------------------------------- Left atrium: The atrium was normal in size.  ------------------------------------------------------------------- Right ventricle: The cavity size was normal. Wall thickness was normal. Systolic function was normal.  ------------------------------------------------------------------- Pulmonic valve:  Structurally normal valve.  Cusp separation was normal. Doppler: Transvalvular velocity was within the normal range. There was no evidence for stenosis. There was no regurgitation.  ------------------------------------------------------------------- Tricuspid valve:  Structurally normal valve.  Doppler: Transvalvular velocity was within the normal range. There was trivial regurgitation.  ------------------------------------------------------------------- Pulmonary artery:  The main pulmonary artery was normal-sized. Systolic pressure was within the normal range.  ------------------------------------------------------------------- Right atrium: The atrium was normal in size.  ------------------------------------------------------------------- Pericardium: There was no pericardial  effusion.  ------------------------------------------------------------------- Systemic veins: Inferior vena cava: The vessel was normal in size.  ------------------------------------------------------------------- Measurements  Left ventricle             Value    Reference LV ID, ED, PLAX chordal        44.3 mm   43 - 52 LV ID, ES, PLAX chordal        31  mm   23 - 38 LV fx shortening, PLAX chordal     30  %   >=29 LV PW thickness, ED          9.89 mm   --------- IVS/LV PW ratio, ED          1.19     <=1.3 LV e&', lateral             8.38 cm/s  --------- LV E/e&', lateral            8.15     --------- LV e&', medial             5.11 cm/s  --------- LV E/e&', medial            13.37    --------- LV e&', average             6.75 cm/s  --------- LV E/e&', average            10.13    ---------  Ventricular septum           Value    Reference IVS thickness, ED           11.8 mm   ---------  Aorta                 Value    Reference Aortic root ID, ED           35  mm   ---------  Left atrium              Value    Reference LA ID, A-P, ES             39  mm   --------- LA ID/bsa, A-P             1.79 cm/m^2 <=2.2 LA volume, S              40.1 ml   --------- LA volume/bsa, S  18.4 ml/m^2 --------- LA volume, ES, 1-p A4C         40  ml   --------- LA volume/bsa, ES, 1-p A4C       18.3 ml/m^2 --------- LA volume, ES, 1-p A2C         40.1 ml   --------- LA volume/bsa, ES, 1-p A2C       18.4 ml/m^2 ---------  Mitral valve              Value    Reference Mitral E-wave peak velocity      68.3  cm/s  --------- Mitral A-wave peak velocity      105  cm/s  --------- Mitral deceleration time        222  ms   150 - 230 Mitral E/A ratio, peak         0.7     ---------  Pulmonary arteries           Value    Reference PA pressure, S, DP           30  mm Hg <=30  Tricuspid valve            Value    Reference Tricuspid regurg peak velocity     258  cm/s  --------- Tricuspid peak RV-RA gradient     27  mm Hg ---------  Systemic veins             Value    Reference Estimated CVP             3   mm Hg ---------  Right ventricle            Value    Reference RV pressure, S, DP           30  mm Hg <=30 RV s&', lateral, S           10.8 cm/s  ---------  Legend: (L) and (H) mark values outside specified reference range.  ------------------------------------------------------------------- Prepared and Electronically Authenticated by  Fransico Him, MD 2015-12-30T11:20:10    Specimen Collected: 02/20/14 9:08 AM Last Resulted: 02/20/14 11:20 AM Order Details View Encounter Lab and Collection Details Routing Result History

## 2014-02-28 ENCOUNTER — Other Ambulatory Visit (HOSPITAL_BASED_OUTPATIENT_CLINIC_OR_DEPARTMENT_OTHER): Payer: 59

## 2014-02-28 ENCOUNTER — Ambulatory Visit (HOSPITAL_BASED_OUTPATIENT_CLINIC_OR_DEPARTMENT_OTHER): Payer: 59 | Admitting: Oncology

## 2014-02-28 ENCOUNTER — Encounter: Payer: Self-pay | Admitting: Oncology

## 2014-02-28 ENCOUNTER — Telehealth: Payer: Self-pay | Admitting: Oncology

## 2014-02-28 ENCOUNTER — Other Ambulatory Visit: Payer: Self-pay | Admitting: *Deleted

## 2014-02-28 VITALS — BP 157/64 | HR 89 | Temp 98.4°F | Resp 18 | Ht 66.0 in | Wt 215.9 lb

## 2014-02-28 DIAGNOSIS — T451X5A Adverse effect of antineoplastic and immunosuppressive drugs, initial encounter: Secondary | ICD-10-CM

## 2014-02-28 DIAGNOSIS — G622 Polyneuropathy due to other toxic agents: Secondary | ICD-10-CM

## 2014-02-28 DIAGNOSIS — E876 Hypokalemia: Secondary | ICD-10-CM

## 2014-02-28 DIAGNOSIS — C541 Malignant neoplasm of endometrium: Secondary | ICD-10-CM

## 2014-02-28 DIAGNOSIS — G620 Drug-induced polyneuropathy: Secondary | ICD-10-CM

## 2014-02-28 DIAGNOSIS — D6481 Anemia due to antineoplastic chemotherapy: Secondary | ICD-10-CM

## 2014-02-28 LAB — COMPREHENSIVE METABOLIC PANEL (CC13)
ALBUMIN: 3.9 g/dL (ref 3.5–5.0)
ALT: 19 U/L (ref 0–55)
AST: 24 U/L (ref 5–34)
Alkaline Phosphatase: 63 U/L (ref 40–150)
Anion Gap: 9 mEq/L (ref 3–11)
BILIRUBIN TOTAL: 0.55 mg/dL (ref 0.20–1.20)
BUN: 12.6 mg/dL (ref 7.0–26.0)
CO2: 29 mEq/L (ref 22–29)
Calcium: 9.6 mg/dL (ref 8.4–10.4)
Chloride: 102 mEq/L (ref 98–109)
Creatinine: 0.8 mg/dL (ref 0.6–1.1)
EGFR: 72 mL/min/{1.73_m2} — AB (ref >90)
Glucose: 88 mg/dl (ref 70–140)
POTASSIUM: 4 meq/L (ref 3.5–5.1)
Sodium: 141 mEq/L (ref 136–145)
Total Protein: 6.7 g/dL (ref 6.4–8.3)

## 2014-02-28 LAB — CBC WITH DIFFERENTIAL/PLATELET
BASO%: 0.5 % (ref 0.0–2.0)
Basophils Absolute: 0 10*3/uL (ref 0.0–0.1)
EOS%: 1.2 % (ref 0.0–7.0)
Eosinophils Absolute: 0.1 10*3/uL (ref 0.0–0.5)
HCT: 36.5 % (ref 34.8–46.6)
HGB: 11.6 g/dL (ref 11.6–15.9)
LYMPH%: 28 % (ref 14.0–49.7)
MCH: 32 pg (ref 25.1–34.0)
MCHC: 31.8 g/dL (ref 31.5–36.0)
MCV: 100.6 fL (ref 79.5–101.0)
MONO#: 0.7 10*3/uL (ref 0.1–0.9)
MONO%: 9.5 % (ref 0.0–14.0)
NEUT#: 4.5 10*3/uL (ref 1.5–6.5)
NEUT%: 60.8 % (ref 38.4–76.8)
PLATELETS: 171 10*3/uL (ref 145–400)
RBC: 3.63 10*6/uL — ABNORMAL LOW (ref 3.70–5.45)
RDW: 18.6 % — ABNORMAL HIGH (ref 11.2–14.5)
WBC: 7.4 10*3/uL (ref 3.9–10.3)
lymph#: 2.1 10*3/uL (ref 0.9–3.3)

## 2014-02-28 MED ORDER — ONDANSETRON HCL 8 MG PO TABS: ORAL_TABLET | ORAL | Status: DC

## 2014-02-28 MED ORDER — LIDOCAINE-PRILOCAINE 2.5-2.5 % EX CREA: 1.0000 "application " | TOPICAL_CREAM | CUTANEOUS | Status: DC | PRN

## 2014-02-28 NOTE — Patient Instructions (Signed)
B6 50 mg twice daily while on doxil, to decrease skin reaction on palms and soles

## 2014-02-28 NOTE — Progress Notes (Signed)
OFFICE PROGRESS NOTE   02/28/2014   Physicians:Brewster, Canary Brim, Lorri Frederick, Louie Casa ([facility])  INTERVAL HISTORY:   Patient is seen, together with husband, for discussion of change in chemo regimen to doxil for endometrial carcinoma recurrent in pelvis, with significant improvement but some persistent disease after 9 cycles of carbo taxol by CT 02-08-14. The carbo taxol was given from 06-26-13 thru 01-21-14.She saw Dr Skeet Latch after the CT, with recommendation to change treatment to doxil and evaluate with PET after 3 cycles. Echocardiogram 02-20-14 has EF 55-60%, additionally showed global LV strain per report.  Patient has felt progressively better during this short chemo break, especially with improved energy in last week. She has no neuropathy in fingers, feet still numb tho are a bit better. Appetite is improved, no abdominal or pelvic discomfort, no bleeding, no LE swelling.  She saw Dr Madilyn Fireman on 02-12-14. DIuretic was DCd at hospital discharge in early Dec. She completed oral antibiotic from hospitalization.  We have discussed PAC, which is indicated for doxil as peripheral IV access has been difficult. Patient and husband are in agreement. Flu vaccine done   ONCOLOGIC HISTORY Patient presented with vaginal spotting 07-2010. Endometrial biopsy by Dr Clovia Cuff 10-21-10 revealed endometroid carcinoma grade 1 focally involving polyp. She had robotic assisted total laparoscopic hysterectomy BSO and 20 pelvic node evaluation by Dr Skeet Latch on 11-24-2010. Pathology [phone]) had endometrial adenocarcinoma Grade I with focal squamous differentiation, superficial myometrial invasion ( 0.2 cm where myometrium 1.4 cm); benign cervix, tubes and ovaries; no LVSI. With no high risk features, she was followed with observation only, including exams by gyn oncology every 6 months.  In early March 2015 she had acute onset of lower abdominal pain, then fever and nausea over next  several days. She was treated by PCP for presumed diverticulitis with cipro and flagyl, initially with improvement then symptoms recurrent in late March. She had CT AP 05-23-13 showed a 4.1x4.1 cm mass left anterior upper pelvis, and CT biopsy 05-31-13 of mesenteric mass at level of umbilicus showed metastatic adenocarcinoma consistent with endometrioid carcinoma. Patient saw Dr Skeet [facility]atch at [facility] on [facility]-08-13, with exam remarkable for palpable right pelvic mass and recommendation for 3 cycles of taxane / platin chemotherapy, then repeat imaging; may also consider radiation after chemotherapy. The initial abdominal pain resolved entirely. Cycle 1 taxol carboplatin was given 06-26-2013; she was neutropenic with ANC 0 on day 14. Repeat CT AP after cycle 3 had partial response of dominant ventral peritoneal mass, right pelvic sidewall mass and implant along sigmoid colon. She completed additional 3 cycles of carboplatin and taxol for total 6 cycles on 10-18-13, with neulasta support. Restaging CT showed partial response but still residual involvement including intraperitoneal disease, with additional 3 cycles carbo taxol recommended. She had cycle 1 day 1 dose dense carbo taxol on 11-26-13  Review of systems as above, also: Bowels fine. Voiding without difficulty. No LE swelling. Remainder of 10 point Review of Systems negative.  Objective:  Vital signs in last 24 hours:  BP 157/64 mmHg  Pulse 89  Temp(Src) 98.4 F (36.9 C) (Oral)  Resp 18  Ht _0  (1.676 m)  Wt 215 lb 14.4 oz (97.932 kg)  BMI 34.86 kg/m2 weight up 1 lb.  Alert, oriented and appropriate. Ambulatory without difficulty.  Alopecia  HEENT:PERRL, sclerae not icteric. Oral mucosa moist without lesions, posterior pharynx clear.  Neck supple. No JVD.  Lymphatics:no cervical,supraclavicular, axillary or inguinal adenopathy Resp: clear to auscultation bilaterally and normal  percussion bilaterally Cardio: regular rate and rhythm. No gallop. GI:  soft, nontender, not distended, no mass or organomegaly. Normally active bowel sounds. Surgical incision not remarkable. Musculoskeletal/ Extremities: without pitting edema, cords, tenderness Neuro:  peripheral neuropathy as noted. Otherwise nonfocal. PSYCH appropriate mood and affect Skin slight discoloration under axillae, otherwise without rash, ecchymosis, petechiae   Lab Results:  Results for orders placed or performed in visit on 02/28/14  CBC with Differential  Result Value Ref Range   WBC 7.4 3.9 - 10.3 10e3/uL   NEUT# 4.5 1.5 - 6.5 10e3/uL   HGB 11.6 11.6 - 15.9 g/dL   HCT 36.5 34.8 - 46.6 %   Platelets 171 145 - 400 10e3/uL   MCV 100.6 79.5 - 101.0 fL   MCH 32.0 25.1 - 34.0 pg   MCHC 31.8 31.5 - 36.0 g/dL   RBC 3.63 (L) 3.70 - 5.45 10e6/uL   RDW 18.6 (H) 11.2 - 14.5 %   lymph# 2.1 0.9 - 3.3 10e3/uL   MONO# 0.7 0.1 - 0.9 10e3/uL   Eosinophils Absolute 0.1 0.0 - 0.5 10e3/uL   Basophils Absolute 0.0 0.0 - 0.1 10e3/uL   NEUT% 60.8 38.4 - 76.8 %   LYMPH% 28.0 14.0 - 49.7 %   MONO% 9.5 0.0 - 14.0 %   EOS% 1.2 0.0 - 7.0 %   BASO% 0.5 0.0 - 2.0 %  Comprehensive metabolic panel (Cmet) - [facility]  Result Value Ref Range   Sodium 141 136 - 145 mEq/L   Potassium 4.0 3.5 - 5.1 mEq/L   Chloride 102 98 - 109 mEq/L   CO2 29 22 - 29 mEq/L   Glucose 88 70 - 140 mg/dl   BUN 12.6 7.0 - 26.0 mg/dL   Creatinine 0.8 0.6 - 1.1 mg/dL   Total Bilirubin 0.55 0.20 - 1.20 mg/dL   Alkaline Phosphatase 63 40 - 150 U/L   AST 24 5 - 34 U/L   ALT 19 0 - 55 U/L   Total Protein 6.7 6.4 - 8.3 g/dL   Albumin 3.9 3.5 - 5.0 g/dL   Calcium 9.6 8.4 - 10.4 mg/dL   Anion Gap 9 3 - 11 mEq/L   EGFR 72 (L) >90 ml/min/1.73 m2     CT ABDOMEN AND PELVIS WITH CONTRAST  02-08-14  TECHNIQUE: Multidetector CT imaging of the abdomen and pelvis was performed using the standard protocol following bolus administration of intravenous contrast.  CONTRAST: 70m OMNIPAQUE IOHEXOL 300 MG/ML SOLN, 1061m OMNIPAQUE IOHEXOL 300 MG/ML SOLN  COMPARISON: 11/15/2013  FINDINGS: Lower chest: Small type 1 hiatal hernia.  Hepatobiliary: Mild diffuse hepatic steatosis.  Pancreas: Unremarkable  Spleen: Unremarkable  Adrenals/Urinary Tract: Unremarkable  Stomach/Bowel: Sigmoid colon diverticulosis with wall thickening in a significant segment of the sigmoid colon circumferentially, but also focal wall thickening in the distal sigmoid colon on image 69 of series 2. Appendix normal.  Vascular/Lymphatic: Aortoiliac atherosclerotic vascular disease. The right pelvic sidewall implant of tumor shown on some prior exams is no longer readily apparent.  Reproductive: Hysterectomy. Ovaries not well seen.  Other: The abnormal lower omental lobular soft tissue density measures 2.4 by 1.8 cm on image 62 of series 2, formerly 3.6 by 2.0 cm.  Musculoskeletal: Unremarkable  IMPRESSION: 1. Further reduction in size of the omental mass, indicating response to therapy. 2. Sigmoid colon wall thickening may reflect colitis, but there is a focal region of wall thickening near the rectosigmoid junction which could represent colon cancer or a distal sigmoid colon implant of  tumor (see image 69, series 2). 3. Ancillary findings include mild diffuse hepatic steatosis and a small type 1 hiatal herniaStudies/Results:  Discussed CT as above. PACs images reviewed by MD.           ------------------------------------------------------------------- Transthoracic Echocardiography  Patient:  Erika Strong, Erika Strong MR #:    [id number] Study Date: 02/20/2014  ------------------------------------------------------------------- LV EF: 55% -  60%  ------------------------------------------------------------------- History:  PMH: Pre-op evaluation for chemotherapy. Endometrial Cancer. Risk factors:  Hypertension.  ------------------------------------------------------------------- Study Conclusions  - Left ventricle: Global LV strain is -17% The cavity size was normal. There was mild focal basal hypertrophy of the septum. Systolic function was normal. The estimated ejection fraction was in the range of 55% to 60%. Wall motion was normal; there were no regional wall motion abnormalities. There was an increased relative contribution of atrial contraction to ventricular filling. Doppler parameters are consistent with abnormal left ventricular relaxation (grade 1 diastolic dysfunction). - Mitral valve: Peak A-wave velocity: 105 cm/s. - Tricuspid valve: There was trivial regurgitation.  Transthoracic echocardiography. M-mode, complete 2D, spectral Doppler, and color Doppler. Birthdate: Patient birthdate: [birth year]. Age: Patient is 69 yr old. Sex: Gender: female. BMI: 33.7 kg/m^2. Blood pressure:   138/82 Patient status: Outpatient. Study date: Study date: 02/20/2014. Study time: 09:08 AM. Location: Echo laboratory.   Medications: I have reviewed the patient's current medications. Since she is no longer taking diuretic, will decrease K to once daily and recheck on that dose. No oral decadron with doxil. Do not anticipate needing gCSF with doxil. Home B vitamin supplement which she has brought for my review does not have sufficient B6, understands that she needs 50 mg bid - tid as preventative for hand foot syndrome with doxil  DISCUSSION: patient had full teaching by RN prior to visit today, this reviewed by MD now. We have particularly discussed skin care and reactions, cardiac function and administration by central line. By completion of discussion patient and husband have had all questions answered and are in agreement with plan. Will begin doxil 1-18 and I will see her ~ 10-14 days later, with labs.  Assessment/Plan:  1.Endometrial carcinoma initially  IA grade 1 at surgery 11-2010, then multifocal recurrence in pelvis found April 2015. Partial response by CT after 9 cycles of carboplatin and taxol, now planning 3 cycles doxil and follow up with PET. 2.taxol related peripheral neuropathy: feet a little better off that drug. Continue massage and follow. 3.HTN : EF good for doxil. LV strain mentioned. CC this note to PCP Dr Madilyn Fireman 4.fatty liver and obesity  5.diverticulosis: not symptomatic  6.hypokalemia improved: now off diuretic, decrease to daily and follow 7.skin irritation in axillae likely from "all natural" deodorant, resolved 8.mild chemo anemia: 9. Peripheral IV access not adequate for doxil. PAC by IR requested.  Consent obtained. Zofran and EMLA prescriptions done.Chemo orders entered.   LIVESAY,LENNIS P, MD   02/28/2014, 5:00 PM

## 2014-02-28 NOTE — Telephone Encounter (Signed)
per pof to sch pt appt-sent MW emailt o schpt-gave pt copy of sch

## 2014-03-01 ENCOUNTER — Telehealth: Payer: Self-pay | Admitting: *Deleted

## 2014-03-01 NOTE — Telephone Encounter (Signed)
Per staff message and POF I have scheduled appts. Advised scheduler of appts. JMW  

## 2014-03-02 ENCOUNTER — Encounter: Payer: Self-pay | Admitting: Oncology

## 2014-03-02 NOTE — Progress Notes (Signed)
[place] TREAT[facility]   Name: TINA GRUNER Date: 03/02/2014 MRN: [id number] DOB: [birth year]   TREATMENT DATES: 06-26-13 thru 01-14-14   REFERRING PHYSICIAN: Dr Guy Sandifer  DIAGNOSIS: recurrent endometrial cancer  STAGE AT START OF TREATMENT: IV   INTENT   Control   DRUGS OR REGIMENS GIVEN: carbplatin and taxol, first 6 cycles as q 3 week regimen and last 3 cycles as dose dense  MAJOR TOXICITIES: peripheral neuropathy in feet. Neutropenia requiring gCSF.   REASON TREATMENT STOPPED:  Maximum benefit   PERFORMANCE STATUS AT END: 1   ONGOING PROBLEMS: peripheral neuropathy in feet, fatigue  FOLLOW UP PLANS: CT AP then visit back to Dr Skeet Latch

## 2014-03-04 ENCOUNTER — Other Ambulatory Visit: Payer: Self-pay | Admitting: Radiology

## 2014-03-05 ENCOUNTER — Other Ambulatory Visit: Payer: Self-pay | Admitting: Radiology

## 2014-03-06 ENCOUNTER — Ambulatory Visit (HOSPITAL_COMMUNITY)
Admission: RE | Admit: 2014-03-06 | Discharge: 2014-03-06 | Disposition: A | Discharge status: Home / Self Care | Payer: Medicare Other | Source: Ambulatory Visit | Attending: Oncology | Admitting: Oncology

## 2014-03-06 ENCOUNTER — Other Ambulatory Visit: Payer: Self-pay | Admitting: Oncology

## 2014-03-06 ENCOUNTER — Encounter (HOSPITAL_COMMUNITY): Payer: Self-pay

## 2014-03-06 DIAGNOSIS — D6481 Anemia due to antineoplastic chemotherapy: Secondary | ICD-10-CM | POA: Diagnosis not present

## 2014-03-06 DIAGNOSIS — I878 Other specified disorders of veins: Secondary | ICD-10-CM | POA: Insufficient documentation

## 2014-03-06 DIAGNOSIS — E785 Hyperlipidemia, unspecified: Secondary | ICD-10-CM | POA: Insufficient documentation

## 2014-03-06 DIAGNOSIS — I10 Essential (primary) hypertension: Secondary | ICD-10-CM | POA: Insufficient documentation

## 2014-03-06 DIAGNOSIS — C541 Malignant neoplasm of endometrium: Secondary | ICD-10-CM | POA: Insufficient documentation

## 2014-03-06 DIAGNOSIS — K760 Fatty (change of) liver, not elsewhere classified: Secondary | ICD-10-CM | POA: Insufficient documentation

## 2014-03-06 LAB — CBC WITH DIFFERENTIAL/PLATELET
BASOS PCT: 0 % (ref 0–1)
Basophils Absolute: 0 10*3/uL (ref 0.0–0.1)
Eosinophils Absolute: 0.1 10*3/uL (ref 0.0–0.7)
Eosinophils Relative: 2 % (ref 0–5)
HEMATOCRIT: 37.3 % (ref 36.0–46.0)
HEMOGLOBIN: 11.8 g/dL — AB (ref 12.0–15.0)
LYMPHS ABS: 2.3 10*3/uL (ref 0.7–4.0)
Lymphocytes Relative: 40 % (ref 12–46)
MCH: 32.2 pg (ref 26.0–34.0)
MCHC: 31.6 g/dL (ref 30.0–36.0)
MCV: 101.6 fL — ABNORMAL HIGH (ref 78.0–100.0)
MONO ABS: 0.5 10*3/uL (ref 0.1–1.0)
MONOS PCT: 8 % (ref 3–12)
NEUTROS ABS: 2.9 10*3/uL (ref 1.7–7.7)
Neutrophils Relative %: 50 % (ref 43–77)
Platelets: 170 10*3/uL (ref 150–400)
RBC: 3.67 MIL/uL — ABNORMAL LOW (ref 3.87–5.11)
RDW: 16.2 % — ABNORMAL HIGH (ref 11.5–15.5)
WBC: 5.7 10*3/uL (ref 4.0–10.5)

## 2014-03-06 LAB — PROTIME-INR
INR: 1.07 (ref 0.00–1.49)
PROTHROMBIN TIME: 14.1 s (ref 11.6–15.2)

## 2014-03-06 MED ORDER — SODIUM CHLORIDE 0.9 % IV SOLN
INTRAVENOUS | Status: DC
  Administered 2014-03-06: 11:00:00 via INTRAVENOUS

## 2014-03-06 MED ORDER — LIDOCAINE-EPINEPHRINE 2 %-1:100000 IJ SOLN
INTRAMUSCULAR | Status: AC
Start: 2014-03-06 — End: 2014-03-06
  Filled 2014-03-06: qty 1

## 2014-03-06 MED ORDER — HEPARIN SOD (PORK) LOCK FLUSH 100 UNIT/ML IV SOLN
INTRAVENOUS | Status: AC
  Filled 2014-03-06: qty 5

## 2014-03-06 MED ORDER — CEFAZOLIN SODIUM-DEXTROSE 2-3 GM-% IV SOLR
INTRAVENOUS | Status: AC
Start: 2014-03-06 — End: 2014-03-06
  Administered 2014-03-06: 2 g via INTRAVENOUS
  Filled 2014-03-06: qty 50

## 2014-03-06 MED ORDER — MIDAZOLAM HCL 2 MG/2ML IJ SOLN
INTRAMUSCULAR | Status: AC
  Filled 2014-03-06: qty 2

## 2014-03-06 MED ORDER — FENTANYL CITRATE 0.05 MG/ML IJ SOLN
INTRAMUSCULAR | Status: AC | PRN
  Administered 2014-03-06: 50 ug via INTRAVENOUS
  Administered 2014-03-06: 25 ug via INTRAVENOUS

## 2014-03-06 MED ORDER — MIDAZOLAM HCL 2 MG/2ML IJ SOLN
INTRAMUSCULAR | Status: AC | PRN
  Administered 2014-03-06: 1 mg via INTRAVENOUS
  Administered 2014-03-06: 0.5 mg via INTRAVENOUS

## 2014-03-06 MED ORDER — CEFAZOLIN SODIUM-DEXTROSE 2-3 GM-% IV SOLR
2.0000 g | Freq: Once | INTRAVENOUS | Status: AC
  Administered 2014-03-06: 2 g via INTRAVENOUS

## 2014-03-06 MED ORDER — FENTANYL CITRATE 0.05 MG/ML IJ SOLN
INTRAMUSCULAR | Status: AC
  Filled 2014-03-06: qty 2

## 2014-03-06 NOTE — Sedation Documentation (Signed)
Patient denies pain and is resting comfortably.  

## 2014-03-06 NOTE — Discharge Instructions (Signed)
Conscious Sedation, Adult, Care After °Refer to this sheet in the next few weeks. These instructions provide you with information on caring for yourself after your procedure. Your health care provider may also give you more specific instructions. Your treatment has been planned according to current medical practices, but problems sometimes occur. Call your health care provider if you have any problems or questions after your procedure. °WHAT TO EXPECT AFTER THE PROCEDURE  °After your procedure: °· You may feel sleepy, clumsy, and have poor balance for several hours. °· Vomiting may occur if you eat too soon after the procedure. °HOME CARE INSTRUCTIONS °· Do not participate in any activities where you could become injured for at least 24 hours. Do not: °¨ Drive. °¨ Swim. °¨ Ride a bicycle. °¨ Operate heavy machinery. °¨ Cook. °¨ Use power tools. °¨ Climb ladders. °¨ Work from a high place. °· Do not make important decisions or sign legal documents until you are improved. °· If you vomit, drink water, juice, or soup when you can drink without vomiting. Make sure you have little or no nausea before eating solid foods. °· Only take over-the-counter or prescription medicines for pain, discomfort, or fever as directed by your health care provider. °· Make sure you and your family fully understand everything about the medicines given to you, including what side effects may occur. °· You should not drink alcohol, take sleeping pills, or take medicines that cause drowsiness for at least 24 hours. °· If you smoke, do not smoke without supervision. °· If you are feeling better, you may resume normal activities 24 hours after you were sedated. °· Keep all appointments with your health care provider. °SEEK MEDICAL CARE IF: °· Your skin is pale or bluish in color. °· You continue to feel nauseous or vomit. °· Your pain is getting worse and is not helped by medicine. °· You have bleeding or swelling. °· You are still sleepy or  feeling clumsy after 24 hours. °SEEK IMMEDIATE MEDICAL CARE IF: °· You develop a rash. °· You have difficulty breathing. °· You develop any type of allergic problem. °· You have a fever. °MAKE SURE YOU: °· Understand these instructions. °· Will watch your condition. °· Will get help right away if you are not doing well or get worse. °Document Released: 11/29/2012 Document Reviewed: 11/29/2012 °Exit[employer]® Patient Information ©2015 [employer]. This information is not intended to replace advice given to you by your health care provider. Make sure you discuss any questions you have with your health care provider. °Implanted Port Insertion, Care After °Refer to this sheet in the next few weeks. These instructions provide you with information on caring for yourself after your procedure. Your health care provider may also give you more specific instructions. Your treatment has been planned according to current medical practices, but problems sometimes occur. Call your health care provider if you have any problems or questions after your procedure. °WHAT TO EXPECT AFTER THE PROCEDURE °After your procedure, it is typical to have the following:  °· Discomfort at the port insertion site. Ice packs to the area will help. °· Bruising on the skin over the port. This will subside in 3-4 days. °HOME CARE INSTRUCTIONS °· After your port is placed, you will get a manufacturer's information card. The card has information about your port. Keep this card with you at all times.   °· Know what kind of port you have. There are many types of ports available.   °· Wear a medical alert   bracelet in case of an emergency. This can help alert health care workers that you have a port.   °· The port can stay in for as long as your health care provider believes it is necessary.   °· A home health care nurse may give medicines and take care of the port.   °· You or a family member can get special training and directions for giving medicine and  taking care of the port at home.   °SEEK MEDICAL CARE IF:  °· Your port does not flush or you are unable to get a blood return.   °· You have a fever or chills. °SEEK IMMEDIATE MEDICAL CARE IF: °· You have new fluid or pus coming from your incision.   °· You notice a bad smell coming from your incision site.   °· You have swelling, pain, or more redness at the incision or port site.   °· You have chest pain or shortness of breath. °Document Released: 11/29/2012 Document Revised: 02/13/2013 Document Reviewed: 11/29/2012 °Exit[employer]® Patient Information ©2015 [employer]. This information is not intended to replace advice given to you by your health care provider. Make sure you discuss any questions you have with your health care provider. ° °

## 2014-03-06 NOTE — Procedures (Signed)
Successful placement of right IJ approach port-a-cath with tip at the superior caval atrial junction. The catheter is ready for immediate use. No immediate post procedural complications. 

## 2014-03-06 NOTE — Sedation Documentation (Signed)
Right upper chest dressing C/D/I at this time.  Dermabond, steri-strips, 4x4s, tape.

## 2014-03-06 NOTE — H&P (Signed)
Chief Complaint: "I'm getting a port put in"  Referring Physician(s): Livesay,Lennis P  History of Present Illness: Erika Strong is a 69 y.o. female with history of recurrent endometrial carcinoma who presents today for port a cath placement for chemotherapy.   Past Medical History  Diagnosis Date  . Wears glasses   . Hypertension   . Diverticulitis 2010  . Hyperlipidemia     2007  . Cancer   . Endometrial cancer 11/02/2010  . Malignant neoplasm of corpus uteri, except isthmus 01/21/2011  . Anemia due to chemotherapy 01/23/2014  . Hepatic steatosis 02/12/2014    Past Surgical History  Procedure Laterality Date  . Gonadectomy and hysterectomy  11/24/2010    Endometrial cancer, performed by Dr. Janie Morning  . Mole removal  Nov 2012  . Abdominal hysterectomy  11/24/2010    RTLH, BSO, RPLND, LPLNS    Allergies: Granix; Atorvastatin; Fish allergy; Lisinopril; Pravastatin sodium; and Simvastatin  Medications: Prior to Admission medications   Medication Sig Start Date End Date Taking? Authorizing Provider  amLODipine (NORVASC) 2.5 MG tablet Take 1 tablet (2.5 mg total) by mouth daily. 01/26/14  Yes Barton Dubois, MD  Ascorbic Acid (VITAMIN C CR) 1000 MG TBCR Take 1,000 mg by mouth daily.   Yes Historical Provider, MD  B Complex-C (B-COMPLEX WITH VITAMIN C) tablet Take 1 tablet by mouth daily.   Yes Historical Provider, MD  LORazepam (ATIVAN) 1 MG tablet 1/2 to 1 tablet every 6 hours as needed for nausea. This will make you drowsy. Can swallow pill or let dissolve under tongue. Night of chemo, whether or not any nausea, take at bedtime 12/31/13  Yes Lennis Marion Downer, MD  OVER THE COUNTER MEDICATION Take 1 tablet by mouth daily. Curamin   Yes Historical Provider, MD  OVER THE COUNTER MEDICATION Take 3 tablets by mouth daily. [place] Bone Strength (calcium product)   Yes Historical Provider, MD  OVER THE COUNTER MEDICATION Take 1 packet by mouth 2 (two) times daily. Preston Fleeting Vitamin Pak (contains fish oil, potassium and multi vitamins)   Yes Historical Provider, MD  OVER THE COUNTER MEDICATION Take 3 tablets by mouth 3 (three) times daily. Intestinal Sooth and Build   Yes Historical Provider, MD  OVER THE COUNTER MEDICATION Take 2 tablets by mouth 2 (two) times daily. Stress J   Yes Historical Provider, MD  OVER THE COUNTER MEDICATION Take 1 tablet by mouth daily. Liver Care   Yes Historical Provider, MD  OVER THE COUNTER MEDICATION Take 10 mLs by mouth daily with breakfast. Liquid chlorophyl   Yes Historical Provider, MD  OVER THE COUNTER MEDICATION Take 30 mLs by mouth daily with breakfast. Flax Seed Oil   Yes Historical Provider, MD  OVER THE COUNTER MEDICATION Take 2 tablets by mouth 2 (two) times daily.   Yes Historical Provider, MD  potassium chloride 20 MEQ/15ML (10%) SOLN Take 22.5 mLs (30 mEq total) by mouth 3 (three) times daily. Patient taking differently: Take 30 mEq by mouth daily.  01/26/14  Yes Barton Dubois, MD  Boyd   Yes Historical Provider, MD  Probiotic Product (PROBIOTIC DAILY) CAPS Take 1 capsule by mouth daily.   Yes Historical Provider, MD  pyridOXINE (VITAMIN B-6) 100 MG tablet Take 100 mg by mouth daily.   Yes Historical Provider, MD  Ubiquinol 100 MG CAPS Take 100 mg by mouth daily with breakfast.    Yes Historical Provider, MD  acyclovir ointment (ZOVIRAX)  5 % Apply to fever blister 5 times a day Patient taking differently: Apply 1 application topically every 3 (three) hours. Apply to fever blister 5 times a day 07/30/13   Gordy Levan, MD  dexamethasone (DECADRON) 4 MG tablet Take 5 tablets with food 12 hrs prior to Taxol Chemotherapy Patient not taking: Reported on 02/12/2014 01/21/14   Gordy Levan, MD  ferrous fumarate (HEMOCYTE) 325 (106 FE) MG TABS tablet Take one tablet by mouth daily on an empty stomach with orange juice. Patient not taking: Reported on 02/28/2014 01/29/14   Gordy Levan, MD  lidocaine-prilocaine (EMLA) cream Apply 1 application topically as needed. Apply to port 1 hour prior to lab draw or chemo 02/28/14   Lennis P Marko Plume, MD  ondansetron (ZOFRAN) 8 MG tablet 1-2 tablets every 12 hours as needed for nausea. Take 1 tablet the AM after chemo whether or not any nausea. This will not make you drowsy. 02/28/14   Lennis Marion Downer, MD  Potassium 99 MG TABS Take 1 tablet by mouth daily.    Historical Provider, MD    Family History  Problem Relation Age of Onset  . Lung cancer Father     smoker  . Cancer Father   . Osteoporosis Mother   . Rheum arthritis Mother   . Cancer Mother     History   Social History  . Marital Status: Married    Spouse Name: [name]    Number of Children: 1   . Years of Education: Assoc degr   Occupational History  . cosmotologist/teacher   .  [employer] History Main Topics  . Smoking status: Never Smoker   . Smokeless tobacco: Never Used  . Alcohol Use: No  . Drug Use: No  . Sexual Activity: No     Comment: cosmetologist, teaches, associate degree, married, regularly exercises.    Other Topics Concern  . None   Social History Narrative   Walks daily for exercise.       Review of Systems  Constitutional: Negative for fever and chills.  Respiratory: Negative for cough and shortness of breath.   Cardiovascular: Negative for chest pain.  Gastrointestinal: Negative for nausea, vomiting, abdominal pain and blood in stool.  Genitourinary: Negative for dysuria and hematuria.  Musculoskeletal: Negative for back pain.  Neurological: Negative for headaches.       Peripheral neuropathy  Hematological: Does not bruise/bleed easily.    Vital Signs: BP 155/79 mmHg  Pulse 81  Temp(Src) 98 F (36.7 C) (Oral)  Resp 16  Ht 5\' 6"  (1.676 m)  Wt 215 lb (97.523 kg)  BMI 34.72 kg/m2  SpO2 99%  Physical Exam  Constitutional: She is oriented to person, place, and time. She appears well-developed  and well-nourished.  Cardiovascular: Normal rate and regular rhythm.   Pulmonary/Chest: Effort normal and breath sounds normal.  Abdominal: Soft. Bowel sounds are normal. There is no tenderness.  Musculoskeletal: Normal range of motion. She exhibits no edema.  Neurological: She is alert and oriented to person, place, and time.    Imaging: Ct Abdomen Pelvis W Contrast  02/08/2014   CLINICAL DATA:  Endometrial cancer.  Chemotherapy in progress.  EXAM: CT ABDOMEN AND PELVIS WITH CONTRAST  TECHNIQUE: Multidetector CT imaging of the abdomen and pelvis was performed using the standard protocol following bolus administration of intravenous contrast.  CONTRAST:  1mL OMNIPAQUE IOHEXOL 300 MG/ML SOLN, 157mL OMNIPAQUE IOHEXOL 300 MG/ML SOLN  COMPARISON:  11/15/2013  FINDINGS: Lower chest:  Small type 1 hiatal hernia.  Hepatobiliary: Mild diffuse hepatic steatosis.  Pancreas: Unremarkable  Spleen: Unremarkable  Adrenals/Urinary Tract: Unremarkable  Stomach/Bowel: Sigmoid colon diverticulosis with wall thickening in a significant segment of the sigmoid colon circumferentially, but also focal wall thickening in the distal sigmoid colon on image 69 of series 2. Appendix normal.  Vascular/Lymphatic: Aortoiliac atherosclerotic vascular disease. The right pelvic sidewall implant of tumor shown on some prior exams is no longer readily apparent.  Reproductive: Hysterectomy.  Ovaries not well seen.  Other: The abnormal lower omental lobular soft tissue density measures 2.4 by 1.8 cm on image 62 of series 2, formerly 3.6 by 2.0 cm.  Musculoskeletal: Unremarkable  IMPRESSION: 1. Further reduction in size of the omental mass, indicating response to therapy. 2. Sigmoid colon wall thickening may reflect colitis, but there is a focal region of wall thickening near the rectosigmoid junction which could represent colon cancer or a distal sigmoid colon implant of tumor (see image 69, series 2). 3. Ancillary findings include mild  diffuse hepatic steatosis and a small type 1 hiatal hernia.   Electronically Signed   By: Sherryl Barters M.D.   On: 02/08/2014 09:33    Labs:  CBC:  Recent Labs  01/25/14 0437 02/08/14 0956 02/28/14 1246 03/06/14 1105  WBC 4.6 6.3 7.4 5.7  HGB 8.3* 11.1* 11.6 11.8*  HCT 24.9* 34.6* 36.5 37.3  PLT 95* 136* 171 170    COAGS:  Recent Labs  05/31/13 0850  INR 1.04  APTT 28    BMP:  Recent Labs  05/09/13 1124  01/24/14 0803 01/25/14 0437 01/26/14 0514 02/08/14 0957 02/28/14 1246  NA 134*  < > 139 138 140 139 141  K 4.0  < > 3.3* 3.4* 4.2 3.7 4.0  CL 91*  --  95* 100 102  --   --   CO2 31  < > 26 27 27 26 29   GLUCOSE 77  < > 91 100* 92 108 88  BUN 12  < > 16 10 11  9.7 12.6  CALCIUM 9.2  < > 9.3 8.1* 9.0 9.2 9.6  CREATININE 0.72  < > 0.66 0.79 0.78 0.8 0.8  GFRNONAA 88  --  >90 85* 85*  --   --   GFRAA >89  --  >90 >90 >90  --   --   < > = values in this interval not displayed.  LIVER FUNCTION TESTS:  Recent Labs  01/24/14 0803 01/25/14 0437 02/08/14 0957 02/28/14 1246  BILITOT 0.7 0.3 0.54 0.55  AST 67* 47* 23 24  ALT 56* 68* 23 19  ALKPHOS 60 40 54 63  PROT 6.3 5.2* 6.5 6.7  ALBUMIN 3.5 2.7* 3.7 3.9    TUMOR MARKERS: No results for input(s): AFPTM, CEA, CA199, CHROMGRNA in the last 8760 hours.  Assessment and Plan: Erika Strong is a 69 y.o. female with history of recurrent endometrial carcinoma who presents today for port a cath placement for chemotherapy. Details/risks of procedure d/w pt/husband with their understanding and consent.      Signed: Autumn Messing 03/06/2014, 11:23 AM

## 2014-03-11 ENCOUNTER — Other Ambulatory Visit (HOSPITAL_BASED_OUTPATIENT_CLINIC_OR_DEPARTMENT_OTHER): Payer: Medicare Other

## 2014-03-11 ENCOUNTER — Ambulatory Visit (HOSPITAL_BASED_OUTPATIENT_CLINIC_OR_DEPARTMENT_OTHER): Payer: Medicare Other | Admitting: Nurse Practitioner

## 2014-03-11 ENCOUNTER — Encounter: Payer: Self-pay | Admitting: Nurse Practitioner

## 2014-03-11 ENCOUNTER — Ambulatory Visit (HOSPITAL_BASED_OUTPATIENT_CLINIC_OR_DEPARTMENT_OTHER): Payer: Medicare Other

## 2014-03-11 DIAGNOSIS — C541 Malignant neoplasm of endometrium: Secondary | ICD-10-CM

## 2014-03-11 DIAGNOSIS — T7840XA Allergy, unspecified, initial encounter: Secondary | ICD-10-CM

## 2014-03-11 DIAGNOSIS — Z5111 Encounter for antineoplastic chemotherapy: Secondary | ICD-10-CM

## 2014-03-11 LAB — CBC WITH DIFFERENTIAL/PLATELET
BASO%: 0 % (ref 0.0–2.0)
Basophils Absolute: 0 10*3/uL (ref 0.0–0.1)
EOS%: 1.6 % (ref 0.0–7.0)
Eosinophils Absolute: 0.1 10*3/uL (ref 0.0–0.5)
HCT: 38.3 % (ref 34.8–46.6)
HGB: 12.4 g/dL (ref 11.6–15.9)
LYMPH#: 2.3 10*3/uL (ref 0.9–3.3)
LYMPH%: 32.7 % (ref 14.0–49.7)
MCH: 32.8 pg (ref 25.1–34.0)
MCHC: 32.4 g/dL (ref 31.5–36.0)
MCV: 101.3 fL — ABNORMAL HIGH (ref 79.5–101.0)
MONO#: 0.7 10*3/uL (ref 0.1–0.9)
MONO%: 9.8 % (ref 0.0–14.0)
NEUT#: 3.9 10*3/uL (ref 1.5–6.5)
NEUT%: 55.9 % (ref 38.4–76.8)
PLATELETS: 168 10*3/uL (ref 145–400)
RBC: 3.78 10*6/uL (ref 3.70–5.45)
RDW: 15.4 % — AB (ref 11.2–14.5)
WBC: 7 10*3/uL (ref 3.9–10.3)

## 2014-03-11 LAB — COMPREHENSIVE METABOLIC PANEL (CC13)
ALK PHOS: 66 U/L (ref 40–150)
ALT: 20 U/L (ref 0–55)
AST: 24 U/L (ref 5–34)
Albumin: 3.9 g/dL (ref 3.5–5.0)
Anion Gap: 9 mEq/L (ref 3–11)
BUN: 11.3 mg/dL (ref 7.0–26.0)
CO2: 28 mEq/L (ref 22–29)
CREATININE: 0.8 mg/dL (ref 0.6–1.1)
Calcium: 9.3 mg/dL (ref 8.4–10.4)
Chloride: 102 mEq/L (ref 98–109)
EGFR: 80 mL/min/{1.73_m2} — AB (ref >90)
Glucose: 89 mg/dl (ref 70–140)
POTASSIUM: 4.1 meq/L (ref 3.5–5.1)
Sodium: 139 mEq/L (ref 136–145)
Total Bilirubin: 0.49 mg/dL (ref 0.20–1.20)
Total Protein: 6.9 g/dL (ref 6.4–8.3)

## 2014-03-11 MED ORDER — SODIUM CHLORIDE 0.9 % IV SOLN
Freq: Once | INTRAVENOUS | Status: AC
  Administered 2014-03-11: 11:00:00 via INTRAVENOUS

## 2014-03-11 MED ORDER — METHYLPREDNISOLONE SODIUM SUCC 125 MG IJ SOLR
125.0000 mg | Freq: Once | INTRAMUSCULAR | Status: AC
  Administered 2014-03-11: 125 mg via INTRAVENOUS

## 2014-03-11 MED ORDER — HEPARIN SOD (PORK) LOCK FLUSH 100 UNIT/ML IV SOLN
500.0000 [IU] | Freq: Once | INTRAVENOUS | Status: AC | PRN
  Administered 2014-03-11: 500 [IU]
  Filled 2014-03-11: qty 5

## 2014-03-11 MED ORDER — ONDANSETRON 8 MG/NS 50 ML IVPB
INTRAVENOUS | Status: AC
  Filled 2014-03-11: qty 8

## 2014-03-11 MED ORDER — DIPHENHYDRAMINE HCL 50 MG/ML IJ SOLN
50.0000 mg | Freq: Once | INTRAMUSCULAR | Status: AC
  Administered 2014-03-11: 50 mg via INTRAVENOUS

## 2014-03-11 MED ORDER — DEXAMETHASONE SODIUM PHOSPHATE 10 MG/ML IJ SOLN
INTRAMUSCULAR | Status: AC
  Filled 2014-03-11: qty 1

## 2014-03-11 MED ORDER — FAMOTIDINE IN NACL 20-0.9 MG/50ML-% IV SOLN
20.0000 mg | Freq: Once | INTRAVENOUS | Status: AC
  Administered 2014-03-11: 20 mg via INTRAVENOUS

## 2014-03-11 MED ORDER — DOXORUBICIN HCL LIPOSOMAL CHEMO INJECTION 2 MG/ML
40.0000 mg/m2 | Freq: Once | INTRAVENOUS | Status: AC
  Administered 2014-03-11: 86 mg via INTRAVENOUS
  Filled 2014-03-11: qty 43

## 2014-03-11 MED ORDER — SODIUM CHLORIDE 0.9 % IJ SOLN
10.0000 mL | INTRAMUSCULAR | Status: DC | PRN
  Administered 2014-03-11: 10 mL
  Filled 2014-03-11: qty 10

## 2014-03-11 MED ORDER — DEXAMETHASONE SODIUM PHOSPHATE 10 MG/ML IJ SOLN
10.0000 mg | Freq: Once | INTRAMUSCULAR | Status: AC
  Administered 2014-03-11: 10 mg via INTRAVENOUS

## 2014-03-11 MED ORDER — ONDANSETRON 8 MG/50ML IVPB (CHCC)
8.0000 mg | Freq: Once | INTRAVENOUS | Status: AC
  Administered 2014-03-11: 8 mg via INTRAVENOUS

## 2014-03-11 NOTE — Progress Notes (Signed)
SYMPTOM MANAGEMENT CLINIC   HPI: Erika Strong 69 y.o. female diagnosed with endometrial cancer.  Here today to initiate cycle 1 of her doxorubicin chemotherapy regimen.  Patient presented to the Home today to receive her first cycle of doxorubicin chemotherapy regimen.  Patient was premedicated with Zofran and dexamethasone 10 mg area patient had completed approximately half of her doxorubicin chemotherapy infusion; and developed a hypersensitivity reaction which consisted of flushing, hives, and mild shortness of breath.  Doxorubicin chemotherapy infusion was held; and hypersensitivity protocol initiated.  Reaction was managed per protocol; patient was able to complete her chemotherapy as previously directed.   HPI  CURRENT THERAPY: Upcoming Treatment Dates - OVARIAN Liposomal Doxorubicin q28d Days with orders from any treatment category:  04/08/2014      SCHEDULING COMMUNICATION      ondansetron (ZOFRAN) IVPB 8 mg      dexamethasone (DECADRON) injection 10 mg      DOXOrubicin HCL LIPOSOMAL (DOXIL) 86 mg in dextrose 5 % 250 mL chemo infusion      sodium chloride 0.9 % injection 10 mL      heparin lock flush 100 unit/mL      heparin lock flush 100 unit/mL      alteplase (CATHFLO ACTIVASE) injection 2 mg      sodium chloride 0.9 % injection 3 mL      Cold Pack 1 packet      0.9 %  sodium chloride infusion      TREATMENT CONDITIONS 05/06/2014      SCHEDULING COMMUNICATION      ondansetron (ZOFRAN) IVPB 8 mg      dexamethasone (DECADRON) injection 10 mg      DOXOrubicin HCL LIPOSOMAL (DOXIL) 86 mg in dextrose 5 % 250 mL chemo infusion      sodium chloride 0.9 % injection 10 mL      heparin lock flush 100 unit/mL      heparin lock flush 100 unit/mL      alteplase (CATHFLO ACTIVASE) injection 2 mg      sodium chloride 0.9 % injection 3 mL      Cold Pack 1 packet      0.9 %  sodium chloride infusion      TREATMENT CONDITIONS 06/03/2014      SCHEDULING COMMUNICATION   ondansetron (ZOFRAN) IVPB 8 mg      dexamethasone (DECADRON) injection 10 mg      DOXOrubicin HCL LIPOSOMAL (DOXIL) 86 mg in dextrose 5 % 250 mL chemo infusion      sodium chloride 0.9 % injection 10 mL      heparin lock flush 100 unit/mL      heparin lock flush 100 unit/mL      alteplase (CATHFLO ACTIVASE) injection 2 mg      sodium chloride 0.9 % injection 3 mL      Cold Pack 1 packet      0.9 %  sodium chloride infusion      TREATMENT CONDITIONS    ROS  Past Medical History  Diagnosis Date  . Wears glasses   . Hypertension   . Diverticulitis 2010  . Hyperlipidemia     2007  . Cancer   . Endometrial cancer 11/02/2010  . Malignant neoplasm of corpus uteri, except isthmus 01/21/2011  . Anemia due to chemotherapy 01/23/2014  . Hepatic steatosis 02/12/2014    Past Surgical History  Procedure Laterality Date  . Gonadectomy and hysterectomy  11/24/2010    Endometrial cancer, performed  by Dr. Janie Morning  . Mole removal  Nov 2012  . Abdominal hysterectomy  11/24/2010    RTLH, BSO, RPLND, LPLNS    has HLD (hyperlipidemia); PROPTOSIS; HYPERTENSION, BENIGN; DIVERTICULITIS, COLON; POSTMENOPAUSAL STATUS; BACK PAIN; OSTEOPENIA; Venous stasis; Endometrial cancer; Malignant neoplasm of corpus uteri, except isthmus; Metabolic syndrome X; Overweight; Degenerative disc disease, cervical; Trigger finger, acquired; Metastatic cancer to pelvis; Anemia due to chemotherapy; Chemotherapy-induced peripheral neuropathy; SIRS (systemic inflammatory response syndrome); Hepatic steatosis; and Hypersensitivity reaction on her problem list.    is allergic to granix; atorvastatin; fish allergy; lisinopril; pravastatin sodium; and simvastatin.    Medication List       This list is accurate as of: 03/11/14  6:02 PM.  Always use your most recent med list.               acyclovir ointment 5 %  Commonly known as:  ZOVIRAX  Apply to fever blister 5 times a day     amLODipine 2.5 MG tablet    Commonly known as:  NORVASC  Take 1 tablet (2.5 mg total) by mouth daily.     B-complex with vitamin C tablet  Take 1 tablet by mouth daily.     dexamethasone 4 MG tablet  Commonly known as:  DECADRON  Take 5 tablets with food 12 hrs prior to Taxol Chemotherapy     ferrous fumarate 325 (106 FE) MG Tabs tablet  Commonly known as:  HEMOCYTE  Take one tablet by mouth daily on an empty stomach with orange juice.     lidocaine-prilocaine cream  Commonly known as:  EMLA  Apply 1 application topically as needed. Apply to port 1 hour prior to lab draw or chemo     LORazepam 1 MG tablet  Commonly known as:  ATIVAN  1/2 to 1 tablet every 6 hours as needed for nausea. This will make you drowsy. Can swallow pill or let dissolve under tongue. Night of chemo, whether or not any nausea, take at bedtime     ondansetron 8 MG tablet  Commonly known as:  ZOFRAN  1-2 tablets every 12 hours as needed for nausea. Take 1 tablet the AM after chemo whether or not any nausea. This will not make you drowsy.     OVER THE COUNTER MEDICATION  Take 1 tablet by mouth daily. Curamin     OVER THE COUNTER MEDICATION  Take 3 tablets by mouth daily. [place] Bone Strength (calcium product)     OVER THE COUNTER MEDICATION  Take 1 packet by mouth 2 (two) times daily. Preston Fleeting Vitamin Pak (contains fish oil, potassium and multi vitamins)     OVER THE COUNTER MEDICATION  Take 3 tablets by mouth 3 (three) times daily. Intestinal Sooth and Build     OVER THE COUNTER MEDICATION  Take 2 tablets by mouth 2 (two) times daily. Stress J     OVER THE COUNTER MEDICATION  Take 1 tablet by mouth daily. Liver Care     OVER THE COUNTER MEDICATION  Take 10 mLs by mouth daily with breakfast. Liquid chlorophyl     OVER THE COUNTER MEDICATION  Take 30 mLs by mouth daily with breakfast. Flax Seed Oil     OVER THE COUNTER MEDICATION  Take 2 tablets by mouth 2 (two) times daily.     Potassium 99 MG Tabs  Take 1  tablet by mouth daily.     potassium chloride 20 MEQ/15ML (10%) Soln  Take 22.5 mLs (30 mEq  total) by mouth 3 (three) times daily.     PRESCRIPTION MEDICATION  Chemo - [facility]     PROBIOTIC DAILY Caps  Take 1 capsule by mouth daily.     pyridOXINE 100 MG tablet  Commonly known as:  VITAMIN B-6  Take 100 mg by mouth daily.     Ubiquinol 100 MG Caps  Take 100 mg by mouth daily with breakfast.     Vitamin C CR 1000 MG Tbcr  Take 1,000 mg by mouth daily.         PHYSICAL EXAMINATION  VItals: 145/72, 75, 98.5   Physical Exam  Constitutional: She is oriented to person, place, and time. Vital signs are normal. She appears unhealthy.  HENT:  Head: Normocephalic and atraumatic.  Mouth/Throat: Oropharynx is clear and moist.  Eyes: Conjunctivae and EOM are normal. Pupils are equal, round, and reactive to light. Right eye exhibits no discharge. Left eye exhibits no discharge. No scleral icterus.  Neck: Normal range of motion. Neck supple. No JVD present. No tracheal deviation present. No thyromegaly present.  Cardiovascular: Normal rate, regular rhythm, normal heart sounds and intact distal pulses.   Pulmonary/Chest: Effort normal and breath sounds normal. No stridor. No respiratory distress. She has no wheezes. She has no rales. She exhibits no tenderness.  Right upper chest Port-A-Cath intact with no evidence of infection.  Abdominal: Soft. Bowel sounds are normal. She exhibits no distension and no mass. There is no tenderness. There is no rebound and no guarding.  Musculoskeletal: Normal range of motion. She exhibits no edema or tenderness.  Lymphadenopathy:    She has no cervical adenopathy.  Neurological: She is alert and oriented to person, place, and time. Gait normal.  Skin: Skin is warm and dry. Rash noted. No erythema.  Patient with some mild generalized hives to her upper chest and upper back.  All symptoms did resolve with hypersensitivity protocol medications.    Psychiatric: Affect normal.  Nursing note and vitals reviewed.   LABORATORY DATA:. Appointment on 03/11/2014  Component Date Value Ref Range Status  . WBC 03/11/2014 7.0  3.9 - 10.3 10e3/uL Final  . NEUT# 03/11/2014 3.9  1.5 - 6.5 10e3/uL Final  . HGB 03/11/2014 12.4  11.6 - 15.9 g/dL Final  . HCT 03/11/2014 38.3  34.8 - 46.6 % Final  . Platelets 03/11/2014 168  145 - 400 10e3/uL Final  . MCV 03/11/2014 101.3* 79.5 - 101.0 fL Final  . MCH 03/11/2014 32.8  25.1 - 34.0 pg Final  . MCHC 03/11/2014 32.4  31.5 - 36.0 g/dL Final  . RBC 03/11/2014 3.78  3.70 - 5.45 10e6/uL Final  . RDW 03/11/2014 15.4* 11.2 - 14.5 % Final  . lymph# 03/11/2014 2.3  0.9 - 3.3 10e3/uL Final  . MONO# 03/11/2014 0.7  0.1 - 0.9 10e3/uL Final  . Eosinophils Absolute 03/11/2014 0.1  0.0 - 0.5 10e3/uL Final  . Basophils Absolute 03/11/2014 0.0  0.0 - 0.1 10e3/uL Final  . NEUT% 03/11/2014 55.9  38.4 - 76.8 % Final  . LYMPH% 03/11/2014 32.7  14.0 - 49.7 % Final  . MONO% 03/11/2014 9.8  0.0 - 14.0 % Final  . EOS% 03/11/2014 1.6  0.0 - 7.0 % Final  . BASO% 03/11/2014 0.0  0.0 - 2.0 % Final  . Sodium 03/11/2014 139  136 - 145 mEq/L Final  . Potassium 03/11/2014 4.1  3.5 - 5.1 mEq/L Final  . Chloride 03/11/2014 102  98 - 109 mEq/L Final  . CO2 03/11/2014  28  22 - 29 mEq/L Final  . Glucose 03/11/2014 89  70 - 140 mg/dl Final  . BUN 03/11/2014 11.3  7.0 - 26.0 mg/dL Final  . Creatinine 03/11/2014 0.8  0.6 - 1.1 mg/dL Final  . Total Bilirubin 03/11/2014 0.49  0.20 - 1.20 mg/dL Final  . Alkaline Phosphatase 03/11/2014 66  40 - 150 U/L Final  . AST 03/11/2014 24  5 - 34 U/L Final  . ALT 03/11/2014 20  0 - 55 U/L Final  . Total Protein 03/11/2014 6.9  6.4 - 8.3 g/dL Final  . Albumin 03/11/2014 3.9  3.5 - 5.0 g/dL Final  . Calcium 03/11/2014 9.3  8.4 - 10.4 mg/dL Final  . Anion Gap 03/11/2014 9  3 - 11 mEq/L Final  . EGFR 03/11/2014 80* >90 ml/min/1.73 m2 Final   eGFR is calculated using the CKD-EPI Creatinine  Equation (2009)     RADIOGRAPHIC STUDIES: No results found.  ASSESSMENT/PLAN:    Endometrial cancer Patient initiated first cycle of Doxil chemotherapy today; but experienced a mild hypersensitivity reaction which consisted of hives, flushing, and mild SOB.  All symptoms managed per protocol; and patient was able to complete her first cycle of Doxil.  Patient is scheduled to return to the [facility] on 03/21/2014 for labs and a follow-up visit.  Cycle 2 of the same chemotherapy regimen will be due on 04/08/2014.   Hypersensitivity reaction Patient presented to the [facility] today to receive her first cycle of Doxil chemotherapy regimen.  She had received approximately half of the Doxil infusion; when she developed a hypersensitivity reaction which consisted of flushing, hives, and mild shortness of breath.  The Doxil infusion was held; and hypersensitivity protocol medications were administered.  Confirmed the patient was premedicated with both Zofran and dexamethasone 10 mg.  Patient was given additional Benadryl 50 mg IV, Solu-Medrol 125 mg IV, and Pepcid 20 mg IV.  O2 was applied via nasal cannula.  Vital signs remained stable throughout.  All hypersensitivity reaction some symptoms did resolve; and patient was able to complete the Doxil chemotherapy is previously directed.   Patient stated understanding of all instructions; and was in agreement with this plan of care. The patient knows to call the clinic with any problems, questions or concerns.   Review/collaboration with Dr. Marko Plume regarding all aspects of patient's visit today.   Total time spent with patient was 40 minutes;  with greater than 75 percent of that time spent in face to face counseling regarding her symptoms, and coordination of care and follow up.  Disclaimer: This note was dictated with voice recognition software. Similar sounding words can inadvertently be transcribed and may not be corrected upon review.    Drue Second, NP 03/11/2014

## 2014-03-11 NOTE — Patient Instructions (Signed)
[facility] Discharge Instructions for Patients Receiving Chemotherapy  Today you received the following chemotherapy agents Doxil.  To help prevent nausea and vomiting after your treatment, we encourage you to take your nausea medication as prescribed.   If you develop nausea and vomiting that is not controlled by your nausea medication, call the clinic.   BELOW ARE SYMPTOMS THAT SHOULD BE REPORTED IMMEDIATELY:  *FEVER GREATER THAN 100.5 F  *CHILLS WITH OR WITHOUT FEVER  NAUSEA AND VOMITING THAT IS NOT CONTROLLED WITH YOUR NAUSEA MEDICATION  *UNUSUAL SHORTNESS OF BREATH  *UNUSUAL BRUISING OR BLEEDING  TENDERNESS IN MOUTH AND THROAT WITH OR WITHOUT PRESENCE OF ULCERS  *URINARY PROBLEMS  *BOWEL PROBLEMS  UNUSUAL RASH Items with * indicate a potential emergency and should be followed up as soon as possible.  Feel free to call the clinic you have any questions or concerns. The clinic phone number is [phone].

## 2014-03-11 NOTE — Progress Notes (Signed)
1130 Pt 1st time Doxil; flushed face, SOB with hives on chest and back.  Selena Lesser, NP at chairside.  Benadryl 50mg  IV Solumedrol 125 IV, Pepcid 20 mg given per protocol.  Pt placed on O2 @ 2 liters.  VSS 1135 Pt face less flushes, some hives still on chest and upper back and she states she is not SOB at present.  VSS   Will continue to monitor.

## 2014-03-11 NOTE — Assessment & Plan Note (Signed)
Patient initiated first cycle of Doxil chemotherapy today; but experienced a mild hypersensitivity reaction which consisted of hives, flushing, and mild SOB.  All symptoms managed per protocol; and patient was able to complete her first cycle of Doxil.  Patient is scheduled to return to the [facility] on 03/21/2014 for labs and a follow-up visit.  Cycle 2 of the same chemotherapy regimen will be due on 04/08/2014.

## 2014-03-11 NOTE — Progress Notes (Signed)
1230 Per Retta Mac NP, restart at previous rate.

## 2014-03-11 NOTE — Assessment & Plan Note (Signed)
Patient presented to the [facility] today to receive her first cycle of Doxil chemotherapy regimen.  She had received approximately half of the Doxil infusion; when she developed a hypersensitivity reaction which consisted of flushing, hives, and mild shortness of breath.  The Doxil infusion was held; and hypersensitivity protocol medications were administered.  Confirmed the patient was premedicated with both Zofran and dexamethasone 10 mg.  Patient was given additional Benadryl 50 mg IV, Solu-Medrol 125 mg IV, and Pepcid 20 mg IV.  O2 was applied via nasal cannula.  Vital signs remained stable throughout.  All hypersensitivity reaction some symptoms did resolve; and patient was able to complete the Doxil chemotherapy is previously directed.

## 2014-03-12 ENCOUNTER — Telehealth: Payer: Self-pay

## 2014-03-12 NOTE — Telephone Encounter (Signed)
Pt got through night all right, slept well. Ate regular breakfast this am with no nausea. No SOB, face a little red, no hives, no chest pain.

## 2014-03-17 ENCOUNTER — Other Ambulatory Visit: Payer: Self-pay | Admitting: Oncology

## 2014-03-21 ENCOUNTER — Other Ambulatory Visit (HOSPITAL_BASED_OUTPATIENT_CLINIC_OR_DEPARTMENT_OTHER): Payer: Medicare Other

## 2014-03-21 ENCOUNTER — Encounter: Payer: Self-pay | Admitting: Oncology

## 2014-03-21 ENCOUNTER — Telehealth: Payer: Self-pay | Admitting: Oncology

## 2014-03-21 ENCOUNTER — Ambulatory Visit (HOSPITAL_BASED_OUTPATIENT_CLINIC_OR_DEPARTMENT_OTHER): Payer: Medicare Other | Admitting: Oncology

## 2014-03-21 VITALS — BP 137/64 | HR 76 | Temp 98.6°F | Resp 18 | Ht 66.0 in | Wt 217.7 lb

## 2014-03-21 DIAGNOSIS — C541 Malignant neoplasm of endometrium: Secondary | ICD-10-CM

## 2014-03-21 DIAGNOSIS — T451X5A Adverse effect of antineoplastic and immunosuppressive drugs, initial encounter: Principal | ICD-10-CM

## 2014-03-21 DIAGNOSIS — I10 Essential (primary) hypertension: Secondary | ICD-10-CM

## 2014-03-21 DIAGNOSIS — T7840XD Allergy, unspecified, subsequent encounter: Secondary | ICD-10-CM

## 2014-03-21 DIAGNOSIS — D6481 Anemia due to antineoplastic chemotherapy: Secondary | ICD-10-CM

## 2014-03-21 DIAGNOSIS — C7989 Secondary malignant neoplasm of other specified sites: Secondary | ICD-10-CM

## 2014-03-21 DIAGNOSIS — G620 Drug-induced polyneuropathy: Secondary | ICD-10-CM

## 2014-03-21 LAB — CBC WITH DIFFERENTIAL/PLATELET
BASO%: 0.2 % (ref 0.0–2.0)
BASOS ABS: 0 10*3/uL (ref 0.0–0.1)
EOS%: 1.7 % (ref 0.0–7.0)
Eosinophils Absolute: 0.1 10*3/uL (ref 0.0–0.5)
HCT: 38 % (ref 34.8–46.6)
HEMOGLOBIN: 12.2 g/dL (ref 11.6–15.9)
LYMPH%: 36.9 % (ref 14.0–49.7)
MCH: 32.4 pg (ref 25.1–34.0)
MCHC: 32.1 g/dL (ref 31.5–36.0)
MCV: 100.8 fL (ref 79.5–101.0)
MONO#: 0.6 10*3/uL (ref 0.1–0.9)
MONO%: 8.5 % (ref 0.0–14.0)
NEUT#: 3.4 10*3/uL (ref 1.5–6.5)
NEUT%: 52.7 % (ref 38.4–76.8)
Platelets: 156 10*3/uL (ref 145–400)
RBC: 3.77 10*6/uL (ref 3.70–5.45)
RDW: 14.8 % — AB (ref 11.2–14.5)
WBC: 6.5 10*3/uL (ref 3.9–10.3)
lymph#: 2.4 10*3/uL (ref 0.9–3.3)

## 2014-03-21 LAB — COMPREHENSIVE METABOLIC PANEL (CC13)
ALT: 19 U/L (ref 0–55)
AST: 20 U/L (ref 5–34)
Albumin: 3.6 g/dL (ref 3.5–5.0)
Alkaline Phosphatase: 70 U/L (ref 40–150)
Anion Gap: 10 mEq/L (ref 3–11)
BILIRUBIN TOTAL: 0.56 mg/dL (ref 0.20–1.20)
BUN: 12.8 mg/dL (ref 7.0–26.0)
CO2: 26 meq/L (ref 22–29)
Calcium: 9.3 mg/dL (ref 8.4–10.4)
Chloride: 104 mEq/L (ref 98–109)
Creatinine: 0.8 mg/dL (ref 0.6–1.1)
EGFR: 80 mL/min/{1.73_m2} — AB (ref >90)
Glucose: 87 mg/dl (ref 70–140)
Potassium: 4.7 mEq/L (ref 3.5–5.1)
Sodium: 140 mEq/L (ref 136–145)
TOTAL PROTEIN: 6.6 g/dL (ref 6.4–8.3)

## 2014-03-21 MED ORDER — DEXAMETHASONE 4 MG PO TABS: ORAL_TABLET | ORAL | Status: DC

## 2014-03-21 NOTE — Progress Notes (Signed)
OFFICE PROGRESS NOTE  03-21-2014  Physicians:B[place]ster, Abigail Butts; Madilyn Fireman, Barnetta Chapel; Ferdinand Lango, Louie Casa ([facility])  INTERVAL HISTORY:  Patient is seen, together with husband, in continuing attention to endometrial carcinoma recurrent in pelvis, with persistent disease after 9 cycles of carboplatin taxol such that chemotherapy has been changed to doxil. First doxil was given 6-96-29, complicated by acute infusion reaction just after treatment started, with hives and SOB. Symptoms resolved rapidly with stopping doxil and solumedrol/ benadryl/pepcid, with doxil resumed and completed same day.  Patient had no nausea, some slight constipation just after chemo that resolved, and some intermittent fatigue ~ days 6-9. She feels well today. She had no further hives or SOB. She wore soft clothing for first several days and has used coconut oil on skin, without bothersome skin irritation from doxil.   PAC functioned well for the chemotherapy Flu vaccine done    ONCOLOGIC HISTORY Patient presented with vaginal spotting 07-2010. Endometrial biopsy by Dr Clovia Cuff 10-21-10 revealed endometroid carcinoma grade 1 focally involving polyp. She had robotic assisted total laparoscopic hysterectomy BSO and 20 pelvic node evaluation by Dr Skeet Latch on 11-24-2010. Pathology [phone]) had endometrial adenocarcinoma Grade I with focal squamous differentiation, superficial myometrial invasion ( 0.2 cm where myometrium 1.4 cm); benign cervix, tubes and ovaries; no LVSI. With no high risk features, she was followed with observation only, including exams by gyn oncology every 6 months.  In early March 2015 she had acute onset of lower abdominal pain, then fever and nausea over next several days. She was treated by PCP for presumed diverticulitis with cipro and flagyl, initially with improvement then symptoms recurrent in late March. She had CT AP 05-23-13 showed a 4.1x4.1 cm mass left anterior upper pelvis, and CT biopsy 05-31-13  of mesenteric mass at level of umbilicus showed metastatic adenocarcinoma consistent with endometrioid carcinoma. Patient saw Dr Skeet Latch at [facility] on 06-08-13, with exam remarkable for palpable right pelvic mass and recommendation for 3 cycles of taxane / platin chemotherapy, then repeat imaging; may also consider radiation after chemotherapy. The initial abdominal pain resolved entirely. Cycle 1 taxol carboplatin was given 06-26-2013; she was neutropenic with ANC 0 on day 14. Repeat CT AP after cycle 3 had partial response of dominant ventral peritoneal mass, right pelvic sidewall mass and implant along sigmoid colon. She completed additional 3 cycles of carboplatin and taxol for total 6 cycles on 10-18-13, with neulasta support. Restaging CT showed partial response but still residual involvement including intraperitoneal disease, with additional 3 cycles carbo taxol recommended. She had cycle 1 day 1 dose dense carbo taxol on 11-26-13 and completed 3 cycles on 01-21-14. CT 02-08-14 after this total 9 cycles carboplatin taxol had significant improvement but still persistent disease. She began doxil on 03-11-14.   Review of systems as above, also: No fever or symptoms of infection. Appetite good. No LE swelling. No abdominal or pelvic pain. No change in taxol related peripheral neuropathy in feet/ none in hands. Remainder of 10 point Review of Systems negative.  Objective:  Vital signs in last 24 hours:  BP 137/64 mmHg  Pulse 76  Temp(Src) 98.6 F (37 C) (Oral)  Resp 18  Ht 5' 6"  (1.676 m)  Wt 217 lb 11.2 oz (98.748 kg)  BMI 35.15 kg/m2  SpO2 99% Weight up 2 lbs. Alert, oriented and appropriate. Ambulatory without difficulty.  Alopecia  HEENT:PERRL, sclerae not icteric. Oral mucosa moist without lesions, posterior pharynx clear.  Neck supple. No JVD.  Lymphatics:no cervical,supraclavicular adenopathy Resp: clear to auscultation  bilaterally and normal percussion bilaterally Cardio: regular rate  and rhythm. No gallop. GI: soft, nontender, not distended, no mass or organomegaly. Normally active bowel sounds. Surgical incision not remarkable. Musculoskeletal/ Extremities: without pitting edema, cords, tenderness Neuro: no increased peripheral neuropathy. Otherwise nonfocal. PSYCH appropriate mood and affect Skin without rash, ecchymosis, petechiae Portacath-without erythema or tenderness  Lab Results:  Results for orders placed or performed in visit on 03/21/14  CBC with Differential  Result Value Ref Range   WBC 6.5 3.9 - 10.3 10e3/uL   NEUT# 3.4 1.5 - 6.5 10e3/uL   HGB 12.2 11.6 - 15.9 g/dL   HCT 38.0 34.8 - 46.6 %   Platelets 156 145 - 400 10e3/uL   MCV 100.8 79.5 - 101.0 fL   MCH 32.4 25.1 - 34.0 pg   MCHC 32.1 31.5 - 36.0 g/dL   RBC 3.77 3.70 - 5.45 10e6/uL   RDW 14.8 (H) 11.2 - 14.5 %   lymph# 2.4 0.9 - 3.3 10e3/uL   MONO# 0.6 0.1 - 0.9 10e3/uL   Eosinophils Absolute 0.1 0.0 - 0.5 10e3/uL   Basophils Absolute 0.0 0.0 - 0.1 10e3/uL   NEUT% 52.7 38.4 - 76.8 %   LYMPH% 36.9 14.0 - 49.7 %   MONO% 8.5 0.0 - 14.0 %   EOS% 1.7 0.0 - 7.0 %   BASO% 0.2 0.0 - 2.0 %  Comprehensive metabolic panel (Cmet) - [facility]  Result Value Ref Range   Sodium 140 136 - 145 mEq/L   Potassium 4.7 3.5 - 5.1 mEq/L   Chloride 104 98 - 109 mEq/L   CO2 26 22 - 29 mEq/L   Glucose 87 70 - 140 mg/dl   BUN 12.8 7.0 - 26.0 mg/dL   Creatinine 0.8 0.6 - 1.1 mg/dL   Total Bilirubin 0.56 0.20 - 1.20 mg/dL   Alkaline Phosphatase 70 40 - 150 U/L   AST 20 5 - 34 U/L   ALT 19 0 - 55 U/L   Total Protein 6.6 6.4 - 8.3 g/dL   Albumin 3.6 3.5 - 5.0 g/dL   Calcium 9.3 8.4 - 10.4 mg/dL   Anion Gap 10 3 - 11 mEq/L   EGFR 80 (L) >90 ml/min/1.73 m2     Studies/Results:  No results found.  Medications: I have reviewed the patient's current medications. Will add oral decadron ~ 6 hrs prior to next doxil and benadryl with IV premeds, due to reaction with cycle 1. She is on pyridoxine once daily in addition  to B complex. She is on 8+ OTC vitamins etc at her preference.  DISCUSSION: patient wants to continue doxil if possible. Discussed premedicating with steroids and benadryl as above. She understands that skin irritation from doxil is usually cumulative, but is comfortable with preventative measures as noted.  Assessment/Plan:  1.Endometrial carcinoma initially IA grade 1 at surgery 11-2010, then multifocal recurrence in pelvis found April 2015. Partial response by CT after 9 cycles of carboplatin and taxol, now planning 3 cycles doxil and follow up with PET. Cycle 1 doxil 7-61-95 complicated by infusion reaction, plan as above. She will let me know if concerns prior to planned cycle 2 doxil 04-08-14, treat if ANC >=1.5 and plt >=100k.  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 LV strain mentioned, report sent to PCP. 4.fatty liver and obesity  5.diverticulosis: not symptomatic  6.hypokalemia resolved and now off diuretic, stop K supplement and follow. 7.PAC in 8.mild chemo anemia: improved today, follow.  Chemo premeds adjusted. All questions answered and they know to call prior to scheduled visit if needed. Time spent 30 min including >50% counseling and coordination of care.   LIVESAY,LENNIS P, MD   03/21/2014, 10:40 AM

## 2014-03-21 NOTE — Telephone Encounter (Signed)
per pof to sch pt appt-gave pt copy of sch °

## 2014-03-25 ENCOUNTER — Telehealth: Payer: Self-pay

## 2014-03-25 MED ORDER — AMLODIPINE BESYLATE 2.5 MG PO TABS: 2.5000 mg | ORAL_TABLET | Freq: Every day | ORAL | Status: DC

## 2014-03-25 NOTE — Telephone Encounter (Signed)
Erika Strong needs a refill on amlodipine 2.5 mg once daily 90 day supply sent to [facility]. This medication was prescribed by Barton Dubois, MD. Please advise.

## 2014-03-25 NOTE — Telephone Encounter (Signed)
Rx sent 

## 2014-04-05 ENCOUNTER — Other Ambulatory Visit: Payer: Self-pay

## 2014-04-05 DIAGNOSIS — C541 Malignant neoplasm of endometrium: Secondary | ICD-10-CM

## 2014-04-08 ENCOUNTER — Other Ambulatory Visit (HOSPITAL_BASED_OUTPATIENT_CLINIC_OR_DEPARTMENT_OTHER): Payer: Medicare Other

## 2014-04-08 ENCOUNTER — Ambulatory Visit (HOSPITAL_BASED_OUTPATIENT_CLINIC_OR_DEPARTMENT_OTHER): Payer: Medicare Other

## 2014-04-08 DIAGNOSIS — C541 Malignant neoplasm of endometrium: Secondary | ICD-10-CM

## 2014-04-08 DIAGNOSIS — Z5111 Encounter for antineoplastic chemotherapy: Secondary | ICD-10-CM

## 2014-04-08 LAB — CBC WITH DIFFERENTIAL/PLATELET
BASO%: 0.2 % (ref 0.0–2.0)
BASOS ABS: 0 10*3/uL (ref 0.0–0.1)
EOS ABS: 0 10*3/uL (ref 0.0–0.5)
EOS%: 0.2 % (ref 0.0–7.0)
HCT: 39.9 % (ref 34.8–46.6)
HGB: 12.9 g/dL (ref 11.6–15.9)
LYMPH%: 18.9 % (ref 14.0–49.7)
MCH: 32.7 pg (ref 25.1–34.0)
MCHC: 32.3 g/dL (ref 31.5–36.0)
MCV: 101.3 fL — AB (ref 79.5–101.0)
MONO#: 0.1 10*3/uL (ref 0.1–0.9)
MONO%: 1.8 % (ref 0.0–14.0)
NEUT#: 4.3 10*3/uL (ref 1.5–6.5)
NEUT%: 78.9 % — AB (ref 38.4–76.8)
PLATELETS: 181 10*3/uL (ref 145–400)
RBC: 3.94 10*6/uL (ref 3.70–5.45)
RDW: 14.6 % — ABNORMAL HIGH (ref 11.2–14.5)
WBC: 5.5 10*3/uL (ref 3.9–10.3)
lymph#: 1 10*3/uL (ref 0.9–3.3)

## 2014-04-08 LAB — COMPREHENSIVE METABOLIC PANEL (CC13)
ALBUMIN: 3.9 g/dL (ref 3.5–5.0)
ALT: 22 U/L (ref 0–55)
ANION GAP: 11 meq/L (ref 3–11)
AST: 24 U/L (ref 5–34)
Alkaline Phosphatase: 73 U/L (ref 40–150)
BUN: 13.2 mg/dL (ref 7.0–26.0)
CO2: 26 mEq/L (ref 22–29)
Calcium: 9.9 mg/dL (ref 8.4–10.4)
Chloride: 103 mEq/L (ref 98–109)
Creatinine: 0.8 mg/dL (ref 0.6–1.1)
EGFR: 73 mL/min/{1.73_m2} — ABNORMAL LOW (ref >90)
Glucose: 122 mg/dl (ref 70–140)
POTASSIUM: 3.8 meq/L (ref 3.5–5.1)
Sodium: 140 mEq/L (ref 136–145)
Total Bilirubin: 0.34 mg/dL (ref 0.20–1.20)
Total Protein: 7 g/dL (ref 6.4–8.3)

## 2014-04-08 MED ORDER — ONDANSETRON 8 MG/NS 50 ML IVPB
INTRAVENOUS | Status: AC
  Filled 2014-04-08: qty 8

## 2014-04-08 MED ORDER — DIPHENHYDRAMINE HCL 50 MG/ML IJ SOLN
25.0000 mg | Freq: Once | INTRAMUSCULAR | Status: AC
  Administered 2014-04-08: 25 mg via INTRAVENOUS

## 2014-04-08 MED ORDER — HEPARIN SOD (PORK) LOCK FLUSH 100 UNIT/ML IV SOLN
500.0000 [IU] | Freq: Once | INTRAVENOUS | Status: AC | PRN
  Administered 2014-04-08: 500 [IU]
  Filled 2014-04-08: qty 5

## 2014-04-08 MED ORDER — DOXORUBICIN HCL LIPOSOMAL CHEMO INJECTION 2 MG/ML
40.0000 mg/m2 | Freq: Once | INTRAVENOUS | Status: AC
  Administered 2014-04-08: 86 mg via INTRAVENOUS
  Filled 2014-04-08: qty 43

## 2014-04-08 MED ORDER — DIPHENHYDRAMINE HCL 50 MG/ML IJ SOLN
INTRAMUSCULAR | Status: AC
  Filled 2014-04-08: qty 1

## 2014-04-08 MED ORDER — DEXAMETHASONE SODIUM PHOSPHATE 20 MG/5ML IJ SOLN
INTRAMUSCULAR | Status: AC
  Filled 2014-04-08: qty 5

## 2014-04-08 MED ORDER — SODIUM CHLORIDE 0.9 % IJ SOLN
10.0000 mL | INTRAMUSCULAR | Status: DC | PRN
  Administered 2014-04-08: 10 mL
  Filled 2014-04-08: qty 10

## 2014-04-08 MED ORDER — ONDANSETRON 8 MG/50ML IVPB (CHCC)
8.0000 mg | Freq: Once | INTRAVENOUS | Status: AC
  Administered 2014-04-08: 8 mg via INTRAVENOUS

## 2014-04-08 MED ORDER — SODIUM CHLORIDE 0.9 % IV SOLN
Freq: Once | INTRAVENOUS | Status: AC
  Administered 2014-04-08: 10:00:00 via INTRAVENOUS

## 2014-04-08 MED ORDER — DEXAMETHASONE SODIUM PHOSPHATE 20 MG/5ML IJ SOLN
20.0000 mg | Freq: Once | INTRAMUSCULAR | Status: AC
  Administered 2014-04-08: 20 mg via INTRAVENOUS

## 2014-04-08 NOTE — Patient Instructions (Signed)
[facility] Discharge Instructions for Patients Receiving Chemotherapy  Today you received the following chemotherapy agents Doxil.  To help prevent nausea and vomiting after your treatment, we encourage you to take your nausea medication as prescribed.   If you develop nausea and vomiting that is not controlled by your nausea medication, call the clinic.   BELOW ARE SYMPTOMS THAT SHOULD BE REPORTED IMMEDIATELY:  *FEVER GREATER THAN 100.5 F  *CHILLS WITH OR WITHOUT FEVER  NAUSEA AND VOMITING THAT IS NOT CONTROLLED WITH YOUR NAUSEA MEDICATION  *UNUSUAL SHORTNESS OF BREATH  *UNUSUAL BRUISING OR BLEEDING  TENDERNESS IN MOUTH AND THROAT WITH OR WITHOUT PRESENCE OF ULCERS  *URINARY PROBLEMS  *BOWEL PROBLEMS  UNUSUAL RASH Items with * indicate a potential emergency and should be followed up as soon as possible.  Feel free to call the clinic you have any questions or concerns. The clinic phone number is [phone].

## 2014-04-19 ENCOUNTER — Other Ambulatory Visit: Payer: Self-pay

## 2014-04-19 DIAGNOSIS — C541 Malignant neoplasm of endometrium: Secondary | ICD-10-CM

## 2014-04-21 ENCOUNTER — Other Ambulatory Visit: Payer: Self-pay | Admitting: Oncology

## 2014-04-22 ENCOUNTER — Telehealth: Payer: Self-pay | Admitting: *Deleted

## 2014-04-22 ENCOUNTER — Telehealth: Payer: Self-pay | Admitting: Oncology

## 2014-04-22 ENCOUNTER — Ambulatory Visit (HOSPITAL_BASED_OUTPATIENT_CLINIC_OR_DEPARTMENT_OTHER): Payer: Medicare Other | Admitting: Oncology

## 2014-04-22 ENCOUNTER — Other Ambulatory Visit (HOSPITAL_BASED_OUTPATIENT_CLINIC_OR_DEPARTMENT_OTHER): Payer: Medicare Other

## 2014-04-22 ENCOUNTER — Encounter: Payer: Self-pay | Admitting: Oncology

## 2014-04-22 VITALS — BP 137/63 | HR 72 | Temp 98.5°F | Resp 18 | Ht 66.0 in | Wt 220.5 lb

## 2014-04-22 DIAGNOSIS — E669 Obesity, unspecified: Secondary | ICD-10-CM

## 2014-04-22 DIAGNOSIS — K760 Fatty (change of) liver, not elsewhere classified: Secondary | ICD-10-CM

## 2014-04-22 DIAGNOSIS — C541 Malignant neoplasm of endometrium: Secondary | ICD-10-CM

## 2014-04-22 DIAGNOSIS — T7840XD Allergy, unspecified, subsequent encounter: Secondary | ICD-10-CM

## 2014-04-22 DIAGNOSIS — C7989 Secondary malignant neoplasm of other specified sites: Secondary | ICD-10-CM

## 2014-04-22 DIAGNOSIS — D6481 Anemia due to antineoplastic chemotherapy: Secondary | ICD-10-CM

## 2014-04-22 DIAGNOSIS — I10 Essential (primary) hypertension: Secondary | ICD-10-CM

## 2014-04-22 LAB — CBC WITH DIFFERENTIAL/PLATELET
BASO%: 0.4 % (ref 0.0–2.0)
BASOS ABS: 0 10*3/uL (ref 0.0–0.1)
EOS ABS: 0.1 10*3/uL (ref 0.0–0.5)
EOS%: 1.2 % (ref 0.0–7.0)
HCT: 36.2 % (ref 34.8–46.6)
HEMOGLOBIN: 11.9 g/dL (ref 11.6–15.9)
LYMPH%: 23.4 % (ref 14.0–49.7)
MCH: 31.5 pg (ref 25.1–34.0)
MCHC: 32.7 g/dL (ref 31.5–36.0)
MCV: 96.3 fL (ref 79.5–101.0)
MONO#: 0.9 10*3/uL (ref 0.1–0.9)
MONO%: 12.7 % (ref 0.0–14.0)
NEUT%: 62.3 % (ref 38.4–76.8)
NEUTROS ABS: 4.3 10*3/uL (ref 1.5–6.5)
Platelets: 168 10*3/uL (ref 145–400)
RBC: 3.76 10*6/uL (ref 3.70–5.45)
RDW: 15.2 % — AB (ref 11.2–14.5)
WBC: 6.9 10*3/uL (ref 3.9–10.3)
lymph#: 1.6 10*3/uL (ref 0.9–3.3)

## 2014-04-22 LAB — COMPREHENSIVE METABOLIC PANEL (CC13)
ALT: 16 U/L (ref 0–55)
ANION GAP: 9 meq/L (ref 3–11)
AST: 21 U/L (ref 5–34)
Albumin: 3.4 g/dL — ABNORMAL LOW (ref 3.5–5.0)
Alkaline Phosphatase: 64 U/L (ref 40–150)
BILIRUBIN TOTAL: 0.35 mg/dL (ref 0.20–1.20)
BUN: 13.7 mg/dL (ref 7.0–26.0)
CALCIUM: 9.4 mg/dL (ref 8.4–10.4)
CHLORIDE: 104 meq/L (ref 98–109)
CO2: 26 mEq/L (ref 22–29)
Creatinine: 0.7 mg/dL (ref 0.6–1.1)
EGFR: 84 mL/min/{1.73_m2} — ABNORMAL LOW (ref >90)
Glucose: 93 mg/dl (ref 70–140)
Potassium: 4.2 mEq/L (ref 3.5–5.1)
Sodium: 139 mEq/L (ref 136–145)
TOTAL PROTEIN: 6.3 g/dL — AB (ref 6.4–8.3)

## 2014-04-22 NOTE — Telephone Encounter (Signed)
Per staff message and POF I have scheduled appts. Advised scheduler of appts. JMW  

## 2014-04-22 NOTE — Progress Notes (Signed)
OFFICE PROGRESS NOTE   April 22, 2014   [place], Erika Strong; [place], Barnetta Chapel; Ferdinand Lango, Louie Casa ([facility])  INTERVAL HISTORY:  Patient is seen, together with husband, in continuing attention to recurrent endometrial carcinoma involving pelvis, with persistent disease after 9 cycles of carboplatin taxol such that chemotherapy has been changed to doxil. First doxil was given 7-82-95, complicated by acute infusion reaction just after treatment started: second doxil on 04-08-14 was premedicated with decadron po and IV benadryl,  tolerated without reaction. She is feeling well today.  Patient has been extremely careful with skin and has had no difficulty with this since second doxil. She was energetic after treatment until ~ day 4, then more fatigued for ~ 2 days only. No nausea, bowels ok, no pelvic pain.  PAC in Flu vaccine done  ONCOLOGIC HISTORY Patient presented with vaginal spotting 07-2010. Endometrial biopsy by Dr Clovia Cuff 10-21-10 revealed endometroid carcinoma grade 1 focally involving polyp. She had robotic assisted total laparoscopic hysterectomy BSO and 20 pelvic node evaluation by Dr Skeet Latch on 11-24-2010. Pathology [phone]) had endometrial adenocarcinoma IA FIGO grade I with focal squamous differentiation, superficial myometrial invasion ( 0.2 cm where myometrium 1.4 cm); benign cervix, tubes and ovaries; no LVSI. With no high risk features, she was followed with observation only, including exams by gyn oncology every 6 months.  In early March 2015 she had acute onset of lower abdominal pain, then fever and nausea over next several days. She was treated by PCP for presumed diverticulitis with cipro and flagyl, initially with improvement then symptoms recurrent in late March. She had CT AP 05-23-13 showed a 4.1x4.1 cm mass left anterior upper pelvis, and CT biopsy 05-31-13 of mesenteric mass at level of umbilicus showed metastatic adenocarcinoma consistent with endometrioid  carcinoma. Patient saw Dr Skeet Latch at [facility] on 06-08-13, with exam remarkable for palpable right pelvic mass and recommendation for 3 cycles of taxane / platin chemotherapy, then repeat imaging; may also consider radiation after chemotherapy. The initial abdominal pain resolved entirely. Cycle 1 taxol carboplatin was given 06-26-2013; she was neutropenic with ANC 0 on day 14. Repeat CT AP after cycle 3 had partial response of dominant ventral peritoneal mass, right pelvic sidewall mass and implant along sigmoid colon. She completed additional 3 cycles of carboplatin and taxol for total 6 cycles on 10-18-13, with neulasta support. Restaging CT showed partial response but still residual involvement including intraperitoneal disease, with additional 3 cycles carbo taxol recommended. She had cycle 1 day 1 dose dense carbo taxol on 11-26-13 and completed 3 cycles on 01-21-14. CT 02-08-14 after this total 9 cycles carboplatin taxol had significant improvement but still persistent disease. She began doxil on 03-11-14.   Review of systems as above, also: No fever or symptoms of infection. No SOB. No bleeding. Suture protruding from PAC, not uncomfortable Remainder of 10 point Review of Systems negative.  Objective:  Vital signs in last 24 hours:  BP 137/63 mmHg  Pulse 72  Temp(Src) 98.5 F (36.9 C) (Oral)  Resp 18  Ht _0  (1.676 m)  Wt 220 lb 8 oz (100.018 kg)  BMI 35.61 kg/m2  weight up 3 lbs Alert, oriented and appropriate. Ambulatory without difficulty.  Alopecia  HEENT:PERRL, sclerae not icteric. Oral mucosa moist without lesions, posterior pharynx clear.  Neck supple. No JVD.  Lymphatics:no cervical,supraclavicular or inguinal adenopathy Resp: clear to auscultation bilaterally and normal percussion bilaterally Cardio: regular rate and rhythm. No gallop. GI: soft, nontender, not distended, no mass or organomegaly. Normally  active bowel sounds. Surgical incision not remarkable. Musculoskeletal/  Extremities: without pitting edema, cords, tenderness Neuro: no change peripheral neuropathy. Otherwise nonfocal Skin without rash, ecchymosis, petechiae, not obviously dry. Palms minimal erythema, no desquamation. Suture at lateral aspect of PAC incision still tightly attached, no tenderness or significant erythema; tiny protruding suture also now apparent at supraclavicular insertion site, no erythema or tenderness.  Portacath-without erythema or tenderness, see above re sutures  Lab Results:  Results for orders placed or performed in visit on 04/22/14  CBC with Differential  Result Value Ref Range   WBC 6.9 3.9 - 10.3 10e3/uL   NEUT# 4.3 1.5 - 6.5 10e3/uL   HGB 11.9 11.6 - 15.9 g/dL   HCT 36.2 34.8 - 46.6 %   Platelets 168 145 - 400 10e3/uL   MCV 96.3 79.5 - 101.0 fL   MCH 31.5 25.1 - 34.0 pg   MCHC 32.7 31.5 - 36.0 g/dL   RBC 3.76 3.70 - 5.45 10e6/uL   RDW 15.2 (H) 11.2 - 14.5 %   lymph# 1.6 0.9 - 3.3 10e3/uL   MONO# 0.9 0.1 - 0.9 10e3/uL   Eosinophils Absolute 0.1 0.0 - 0.5 10e3/uL   Basophils Absolute 0.0 0.0 - 0.1 10e3/uL   NEUT% 62.3 38.4 - 76.8 %   LYMPH% 23.4 14.0 - 49.7 %   MONO% 12.7 0.0 - 14.0 %   EOS% 1.2 0.0 - 7.0 %   BASO% 0.4 0.0 - 2.0 %  Comprehensive metabolic panel (Cmet) - [facility]  Result Value Ref Range   Sodium 139 136 - 145 mEq/L   Potassium 4.2 3.5 - 5.1 mEq/L   Chloride 104 98 - 109 mEq/L   CO2 26 22 - 29 mEq/L   Glucose 93 70 - 140 mg/dl   BUN 13.7 7.0 - 26.0 mg/dL   Creatinine 0.7 0.6 - 1.1 mg/dL   Total Bilirubin 0.35 0.20 - 1.20 mg/dL   Alkaline Phosphatase 64 40 - 150 U/L   AST 21 5 - 34 U/L   ALT 16 0 - 55 U/L   Total Protein 6.3 (L) 6.4 - 8.3 g/dL   Albumin 3.4 (L) 3.5 - 5.0 g/dL   Calcium 9.4 8.4 - 10.4 mg/dL   Anion Gap 9 3 - 11 mEq/L   EGFR 84 (L) >90 ml/min/1.73 m2     Studies/Results:  No results found.  Medications: I have reviewed the patient's current medications. She will need to premedicate next doxil with same  regimen.  DISCUSSION: patient is in agreement with continuing doxil with third cycle on schedule, then PET and referral back to gyn oncology then.  Assessment/Plan:  1.Endometrial carcinoma initially IA grade 1 at surgery 11-2010, then multifocal recurrence in pelvis found April 2015. Partial response by CT after 9 cycles of carboplatin and taxol, now planning 3 cycles doxil and follow up with PET. Cycle 1 doxil 6-38-46 complicated by infusion reaction, no problems with cycle 2 after premeds. Cycle 3 due 05-06-14. 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 LV strain mentioned, report sent to PCP. 4.fatty liver and obesity  5.diverticulosis: not symptomatic  6.hypokalemia resolved and now off diuretic. 7.PAC in. Expect sutures will disintegrate and come out. 8.mild chemo anemia: improved, other counts tolerating doxil well.  Chemo orders entered. PET and gyn oncology follow up requested for April. All questions addressed and patient/ husband are in agreement with plans. TIme spent 25 min including >50% counseling and coordination of care.   Sharron Simpson  P, MD   04/22/2014, 10:13 AM

## 2014-04-22 NOTE — Telephone Encounter (Signed)
per pof to sch pt appt-gave tp copy of sch °

## 2014-04-22 NOTE — Telephone Encounter (Signed)
Patient called and left message for treatments. I have called her back and left message with times.

## 2014-04-23 ENCOUNTER — Other Ambulatory Visit: Payer: Self-pay | Admitting: Oncology

## 2014-04-23 ENCOUNTER — Telehealth: Payer: Self-pay | Admitting: Oncology

## 2014-04-23 DIAGNOSIS — C541 Malignant neoplasm of endometrium: Secondary | ICD-10-CM

## 2014-04-23 DIAGNOSIS — C7989 Secondary malignant neoplasm of other specified sites: Secondary | ICD-10-CM

## 2014-04-23 NOTE — Telephone Encounter (Signed)
PT LEFT VM NEEDING NEXT INF APPT-CLD & LEFT PT A MESSAGE & ADV PT OF NEXT APPT TIME & DAT

## 2014-04-24 ENCOUNTER — Telehealth: Payer: Self-pay

## 2014-04-24 NOTE — Telephone Encounter (Signed)
Spoke with [name] and revied taking 5 tabs of decadron on 05-06-14 at 0530 with food prior to Doxil treatment ~1130.  [name] verbalized understanding.

## 2014-04-24 NOTE — Telephone Encounter (Signed)
-----   Message from Gordy Levan, MD sent at 04/23/2014  8:01 PM EST ----- For #3 doxil on 3-14. Needs to premed with decadron same as cycle 2, which was 20 mg with food ~ 6 hrs prior.  Please remind her, as I did not say that to her at visit, and be sure she has the decadron  Thanks!

## 2014-05-02 ENCOUNTER — Telehealth: Payer: Self-pay | Admitting: *Deleted

## 2014-05-02 NOTE — Telephone Encounter (Signed)
Patient called.  She had her last doxil on 04/08/14 and she is due 05/06/14.  She had been doing fine, however this week she ate out twice Monday and Tuesday.  She did not feel very well yesterday and ran a low grade temp part of the day 98.9-99.4 but by last evening it was back down to 97.9.  Her dtr.-in-law ate dinner with them last night and was diagnosed with "the flu" this morning.  Today, Erika Strong is feeling much better, temp is 97.9,  She is wondering what to do.  She did not get close to her dtr-in-law last night.   Checked her CBC's and she has had good WBC and ANC this last two months. Let her know to avoid crowds, good hand-washing, drink plenty of fluids and get good nights rest these next few days.  Let her know there is a GI bug going around. If she starts having problems or a temp more than 100.5 to give [place] a call.  She appreciated our conversation and information.

## 2014-05-06 ENCOUNTER — Other Ambulatory Visit (HOSPITAL_BASED_OUTPATIENT_CLINIC_OR_DEPARTMENT_OTHER): Payer: Medicare Other

## 2014-05-06 ENCOUNTER — Ambulatory Visit (HOSPITAL_BASED_OUTPATIENT_CLINIC_OR_DEPARTMENT_OTHER): Payer: Medicare Other

## 2014-05-06 DIAGNOSIS — C541 Malignant neoplasm of endometrium: Secondary | ICD-10-CM

## 2014-05-06 DIAGNOSIS — Z5111 Encounter for antineoplastic chemotherapy: Secondary | ICD-10-CM

## 2014-05-06 DIAGNOSIS — C7989 Secondary malignant neoplasm of other specified sites: Secondary | ICD-10-CM

## 2014-05-06 DIAGNOSIS — D6481 Anemia due to antineoplastic chemotherapy: Secondary | ICD-10-CM

## 2014-05-06 LAB — COMPREHENSIVE METABOLIC PANEL (CC13)
ALK PHOS: 70 U/L (ref 40–150)
ALT: 23 U/L (ref 0–55)
AST: 25 U/L (ref 5–34)
Albumin: 3.7 g/dL (ref 3.5–5.0)
Anion Gap: 12 mEq/L — ABNORMAL HIGH (ref 3–11)
BILIRUBIN TOTAL: 0.44 mg/dL (ref 0.20–1.20)
BUN: 10.7 mg/dL (ref 7.0–26.0)
CO2: 25 mEq/L (ref 22–29)
Calcium: 10.3 mg/dL (ref 8.4–10.4)
Chloride: 102 mEq/L (ref 98–109)
Creatinine: 0.8 mg/dL (ref 0.6–1.1)
EGFR: 75 mL/min/{1.73_m2} — AB (ref >90)
GLUCOSE: 128 mg/dL (ref 70–140)
Potassium: 4.3 mEq/L (ref 3.5–5.1)
Sodium: 139 mEq/L (ref 136–145)
TOTAL PROTEIN: 7.6 g/dL (ref 6.4–8.3)

## 2014-05-06 LAB — CBC WITH DIFFERENTIAL/PLATELET
BASO%: 0.1 % (ref 0.0–2.0)
Basophils Absolute: 0 10*3/uL (ref 0.0–0.1)
EOS%: 0.3 % (ref 0.0–7.0)
Eosinophils Absolute: 0 10*3/uL (ref 0.0–0.5)
HCT: 39.5 % (ref 34.8–46.6)
HGB: 13 g/dL (ref 11.6–15.9)
LYMPH%: 15 % (ref 14.0–49.7)
MCH: 31.3 pg (ref 25.1–34.0)
MCHC: 32.9 g/dL (ref 31.5–36.0)
MCV: 95.2 fL (ref 79.5–101.0)
MONO#: 0.2 10*3/uL (ref 0.1–0.9)
MONO%: 2.8 % (ref 0.0–14.0)
NEUT#: 6.4 10*3/uL (ref 1.5–6.5)
NEUT%: 81.8 % — AB (ref 38.4–76.8)
Platelets: 208 10*3/uL (ref 145–400)
RBC: 4.15 10*6/uL (ref 3.70–5.45)
RDW: 15.2 % — AB (ref 11.2–14.5)
WBC: 7.8 10*3/uL (ref 3.9–10.3)
lymph#: 1.2 10*3/uL (ref 0.9–3.3)

## 2014-05-06 MED ORDER — SODIUM CHLORIDE 0.9 % IJ SOLN
10.0000 mL | INTRAMUSCULAR | Status: DC | PRN
Start: 2014-05-06 — End: 2014-05-06
  Administered 2014-05-06: 10 mL
  Filled 2014-05-06: qty 10

## 2014-05-06 MED ORDER — SODIUM CHLORIDE 0.9 % IV SOLN
Freq: Once | INTRAVENOUS | Status: AC
  Administered 2014-05-06: 12:00:00 via INTRAVENOUS
  Filled 2014-05-06: qty 4

## 2014-05-06 MED ORDER — DIPHENHYDRAMINE HCL 50 MG/ML IJ SOLN
INTRAMUSCULAR | Status: AC
Start: 2014-05-06 — End: 2014-05-06
  Filled 2014-05-06: qty 1

## 2014-05-06 MED ORDER — HEPARIN SOD (PORK) LOCK FLUSH 100 UNIT/ML IV SOLN
500.0000 [IU] | Freq: Once | INTRAVENOUS | Status: AC | PRN
  Administered 2014-05-06: 500 [IU]
  Filled 2014-05-06: qty 5

## 2014-05-06 MED ORDER — DOXORUBICIN HCL LIPOSOMAL CHEMO INJECTION 2 MG/ML
40.0000 mg/m2 | Freq: Once | INTRAVENOUS | Status: AC
  Administered 2014-05-06: 86 mg via INTRAVENOUS
  Filled 2014-05-06: qty 43

## 2014-05-06 MED ORDER — DIPHENHYDRAMINE HCL 50 MG/ML IJ SOLN
25.0000 mg | Freq: Once | INTRAMUSCULAR | Status: AC
  Administered 2014-05-06: 25 mg via INTRAVENOUS

## 2014-05-06 MED ORDER — SODIUM CHLORIDE 0.9 % IV SOLN
Freq: Once | INTRAVENOUS | Status: AC
  Administered 2014-05-06: 12:00:00 via INTRAVENOUS

## 2014-05-06 NOTE — Patient Instructions (Signed)
[facility] Discharge Instructions for Patients Receiving Chemotherapy  Today you received the following chemotherapy agents doxil  To help prevent nausea and vomiting after your treatment, we encourage you to take your nausea medication as directed. If you develop nausea and vomiting that is not controlled by your nausea medication, call the clinic.   BELOW ARE SYMPTOMS THAT SHOULD BE REPORTED IMMEDIATELY:  *FEVER GREATER THAN 100.5 F  *CHILLS WITH OR WITHOUT FEVER  NAUSEA AND VOMITING THAT IS NOT CONTROLLED WITH YOUR NAUSEA MEDICATION  *UNUSUAL SHORTNESS OF BREATH  *UNUSUAL BRUISING OR BLEEDING  TENDERNESS IN MOUTH AND THROAT WITH OR WITHOUT PRESENCE OF ULCERS  *URINARY PROBLEMS  *BOWEL PROBLEMS  UNUSUAL RASH Items with * indicate a potential emergency and should be followed up as soon as possible.  Feel free to call the clinic you have any questions or concerns. The clinic phone number is [phone].  

## 2014-05-10 ENCOUNTER — Telehealth: Payer: Self-pay | Admitting: *Deleted

## 2014-05-10 NOTE — Telephone Encounter (Signed)
Patient called to say she has had some constipation.  She drank prune juice and had 2 or 3 BMs yesterday.  She had some nausea and took Zofran with good relief.  She wanted to know if she should take Senokot instead of prune juice or is it okay to just use prune juice if it works.  Told her she could use which ever one she felt works for her.  She also asked if she could hold her vitamins if she is nauseated. Advised her that it is very appropriate to do so.

## 2014-05-14 ENCOUNTER — Telehealth: Payer: Self-pay

## 2014-05-14 NOTE — Telephone Encounter (Signed)
[name] reports that she is doing better.  She is getting in at least 64 oz of fluid a day.  She has moved her bowels well the last couple of days. Stool more firm today and not as good an evacuation as she would like. Discussed using 1 Senokot-S at hs to help her bowels propel stool through colon more easily.  She has been using Prune juice.  Told her that she needs to have a good BM daily or at leat every other day.  She needs to be deligent with her bowel routine how ever she chooses. She is reluctant to take the Antiemetics as they contribute to her constipation.  Suggested that she try the medication as this is available to her.  She needs to keep her bowels moving. She is really doing well.  She just has not had any nausea and needed reassurance that the chemotherapy was the culprit.   [name] appreciated the follow up phone call.

## 2014-05-17 ENCOUNTER — Other Ambulatory Visit: Payer: Self-pay | Admitting: *Deleted

## 2014-05-17 DIAGNOSIS — C549 Malignant neoplasm of corpus uteri, unspecified: Secondary | ICD-10-CM

## 2014-05-19 ENCOUNTER — Other Ambulatory Visit: Payer: Self-pay | Admitting: Oncology

## 2014-05-20 ENCOUNTER — Encounter: Payer: Self-pay | Admitting: Oncology

## 2014-05-20 ENCOUNTER — Telehealth: Payer: Self-pay | Admitting: *Deleted

## 2014-05-20 ENCOUNTER — Other Ambulatory Visit: Payer: Self-pay | Admitting: *Deleted

## 2014-05-20 ENCOUNTER — Telehealth: Payer: Self-pay | Admitting: Oncology

## 2014-05-20 ENCOUNTER — Ambulatory Visit (HOSPITAL_BASED_OUTPATIENT_CLINIC_OR_DEPARTMENT_OTHER): Payer: Medicare Other | Admitting: Oncology

## 2014-05-20 ENCOUNTER — Other Ambulatory Visit (HOSPITAL_BASED_OUTPATIENT_CLINIC_OR_DEPARTMENT_OTHER): Payer: Medicare Other

## 2014-05-20 VITALS — BP 131/62 | HR 65 | Temp 99.5°F | Resp 18 | Ht 66.0 in | Wt 211.7 lb

## 2014-05-20 DIAGNOSIS — G620 Drug-induced polyneuropathy: Secondary | ICD-10-CM | POA: Diagnosis not present

## 2014-05-20 DIAGNOSIS — C549 Malignant neoplasm of corpus uteri, unspecified: Secondary | ICD-10-CM

## 2014-05-20 DIAGNOSIS — C541 Malignant neoplasm of endometrium: Secondary | ICD-10-CM

## 2014-05-20 DIAGNOSIS — E876 Hypokalemia: Secondary | ICD-10-CM

## 2014-05-20 DIAGNOSIS — C7989 Secondary malignant neoplasm of other specified sites: Secondary | ICD-10-CM

## 2014-05-20 DIAGNOSIS — D6481 Anemia due to antineoplastic chemotherapy: Secondary | ICD-10-CM

## 2014-05-20 DIAGNOSIS — R110 Nausea: Secondary | ICD-10-CM

## 2014-05-20 LAB — URINALYSIS, MICROSCOPIC - CHCC
Bilirubin (Urine): NEGATIVE
Blood: NEGATIVE
GLUCOSE UR CHCC: NEGATIVE mg/dL
Ketones: NEGATIVE mg/dL
Leukocyte Esterase: NEGATIVE
NITRITE: NEGATIVE
PROTEIN: NEGATIVE mg/dL
RBC / HPF: NEGATIVE (ref 0–2)
Specific Gravity, Urine: 1.005 (ref 1.003–1.035)
UROBILINOGEN UR: 0.2 mg/dL (ref 0.2–1)
pH: 7 (ref 4.6–8.0)

## 2014-05-20 LAB — CBC WITH DIFFERENTIAL/PLATELET
BASO%: 0.3 % (ref 0.0–2.0)
Basophils Absolute: 0 10*3/uL (ref 0.0–0.1)
EOS ABS: 0.2 10*3/uL (ref 0.0–0.5)
EOS%: 2.2 % (ref 0.0–7.0)
HCT: 35.3 % (ref 34.8–46.6)
HEMOGLOBIN: 11.4 g/dL — AB (ref 11.6–15.9)
LYMPH#: 1.1 10*3/uL (ref 0.9–3.3)
LYMPH%: 15.7 % (ref 14.0–49.7)
MCH: 30.6 pg (ref 25.1–34.0)
MCHC: 32.3 g/dL (ref 31.5–36.0)
MCV: 94.6 fL (ref 79.5–101.0)
MONO#: 0.9 10*3/uL (ref 0.1–0.9)
MONO%: 11.8 % (ref 0.0–14.0)
NEUT#: 5.1 10*3/uL (ref 1.5–6.5)
NEUT%: 70 % (ref 38.4–76.8)
Platelets: 157 10*3/uL (ref 145–400)
RBC: 3.73 10*6/uL (ref 3.70–5.45)
RDW: 15.6 % — ABNORMAL HIGH (ref 11.2–14.5)
WBC: 7.3 10*3/uL (ref 3.9–10.3)

## 2014-05-20 LAB — COMPREHENSIVE METABOLIC PANEL (CC13)
ALT: 19 U/L (ref 0–55)
AST: 23 U/L (ref 5–34)
Albumin: 3.1 g/dL — ABNORMAL LOW (ref 3.5–5.0)
Alkaline Phosphatase: 65 U/L (ref 40–150)
Anion Gap: 10 mEq/L (ref 3–11)
BUN: 10.7 mg/dL (ref 7.0–26.0)
CALCIUM: 9.7 mg/dL (ref 8.4–10.4)
CHLORIDE: 103 meq/L (ref 98–109)
CO2: 27 meq/L (ref 22–29)
Creatinine: 0.8 mg/dL (ref 0.6–1.1)
EGFR: 76 mL/min/{1.73_m2} — ABNORMAL LOW (ref >90)
GLUCOSE: 93 mg/dL (ref 70–140)
POTASSIUM: 3.5 meq/L (ref 3.5–5.1)
Sodium: 140 mEq/L (ref 136–145)
Total Bilirubin: 0.55 mg/dL (ref 0.20–1.20)
Total Protein: 6.3 g/dL — ABNORMAL LOW (ref 6.4–8.3)

## 2014-05-20 MED ORDER — LORAZEPAM 1 MG PO TABS: ORAL_TABLET | ORAL | Status: DC

## 2014-05-20 NOTE — Telephone Encounter (Signed)
Told [name] the results of the labs as noted below by Dr. Marko Plume and that her urine looked fine per Dr. Marko Plume.  The urine culture was cancelled. [name] took a pepcid 10 mg tablet this afternoon and feels lig a new person.  She ate diner and has not had any problems. She stated that she will take the ativan as recommended by Dr. Marko Plume  for the next 2 nights. She appreciated the call. [name] actually called the triage nurse after 5 pm to get the results of her urine test but they were not abe to see the urine results in epic.

## 2014-05-20 NOTE — Telephone Encounter (Signed)
Appointments made and avs printed for patient   Erika Strong

## 2014-05-20 NOTE — Progress Notes (Signed)
OFFICE PROGRESS NOTE   May 20, 2014   Physicians:[place], Abigail Butts; [place], Barnetta Chapel; Darlis Loan ([facility])  INTERVAL HISTORY:  Patient is seen, together with husband, having had cycle 3 doxil on 05-06-14 for recurrent endometrial carcinoma which did not respond completely to  9 cycles of carboplatin taxol previously. Counts have maintained without gCSF.  She is scheduled for PET on 05-29-14 and will see Dr Skeet Latch on 06-13-14.   Patient had no significant side effects with first 2 cycles of doxil. Low grade nausea began just prior to cycle 3 doxil, after eating BBQ; husband ate the same food and had similar symptoms x 3 days which then resolved entirely for him. Patient's nausea has been intermittent, with occasional days fine, has vomited x1, no clear GERD, no dysuria, low grade temp 99.1 occasionally, bowels moving regularly without black stools. She used 0.5 mg ativan 30 min before meals for past 2 days helpful, a little drowsy, did take this AM. No skin problems with third cycle doxil. Note she had no nausea with first 2 cycles of doxil, and very little with previous taxol [place].    PAC in Flu vaccine done  ONCOLOGIC HISTORY Patient presented with vaginal spotting 07-2010. Endometrial biopsy by Dr Clovia Cuff 10-21-10 revealed endometroid carcinoma grade 1 focally involving polyp. She had robotic assisted total laparoscopic hysterectomy BSO and 20 pelvic node evaluation by Dr Skeet Latch on 11-24-2010. Pathology [phone]) had endometrial adenocarcinoma IA FIGO grade I with focal squamous differentiation, superficial myometrial invasion ( 0.2 cm where myometrium 1.4 cm); benign cervix, tubes and ovaries; no LVSI. With no high risk features, she was followed with observation only, including exams by gyn oncology every 6 months.  In early March 2015 she had acute onset of lower abdominal pain, then fever and nausea over next several days. She was treated by PCP for presumed  diverticulitis with cipro and flagyl, initially with improvement then symptoms recurrent in late March. She had CT AP 05-23-13 showed a 4.1x4.1 cm mass left anterior upper pelvis, and CT biopsy 05-31-13 of mesenteric mass at level of umbilicus showed metastatic adenocarcinoma consistent with endometrioid carcinoma. Patient saw Dr Skeet Latch at [facility] on 06-08-13, with exam remarkable for palpable right pelvic mass and recommendation for 3 cycles of taxane / platin chemotherapy, then repeat imaging; may also consider radiation after chemotherapy. The initial abdominal pain resolved entirely. Cycle 1 taxol carboplatin was given 06-26-2013; she was neutropenic with ANC 0 on day 14. Repeat CT AP after cycle 3 had partial response of dominant ventral peritoneal mass, right pelvic sidewall mass and implant along sigmoid colon. She completed additional 3 cycles of carboplatin and taxol for total 6 cycles on 10-18-13, with neulasta support. Restaging CT showed partial response but still residual involvement including intraperitoneal disease, with additional 3 cycles carbo taxol recommended. She had cycle 1 day 1 dose dense carbo taxol on 11-26-13 and completed 3 cycles on 01-21-14. CT 02-08-14 after this total 9 cycles carboplatin taxol had significant improvement but still persistent disease. She began doxil on 03-11-14.    Review of systems as above, also: No SOB, no chest pain. No noted bleeding. Slept well last pm. No new or different pain.No LE swelling. No bleeding. No problems with PAC. Not eating nearly as much as usual "afraid to eat", is drinking water and gatorade well. Remainder of 10 point Review of Systems negative.  Objective:  Vital signs in last 24 hours:  BP 131/62 mmHg  Pulse 65  Temp(Src) 99.5 F (  37.5 C) (Oral)  Resp 18  Ht 5\' 6"  (1.676 m)  Wt 211 lb 11.2 oz (96.026 kg)  BMI 34.19 kg/m2 Weight down 8 lbs. Looks slightly drowsy Alert, oriented and appropriate. Ambulatory without assistance.   Alopecia  HEENT:PERRL, sclerae not icteric. Oral mucosa moist without lesions, posterior pharynx clear.  Neck supple. No JVD.  Lymphatics:no cervical,supraclavicular or inguinal adenopathy Resp: clear to auscultation bilaterally and normal percussion bilaterally Cardio: regular rate and rhythm. No gallop. GI: soft, nontender including epigastrium, not distended, no mass or organomegaly. Normally active bowel sounds. Surgical incision not remarkable. Musculoskeletal/ Extremities: Some kyphosis, stable.  LE without pitting edema, cords, tenderness Neuro: no change peripheral neuropathy. Speech fluent and appropriate Otherwise nonfocal. PSYCH appropriate mood and affect Skin without rash, ecchymosis, petechiae  Portacath-without erythema or tenderness. TIny suture still apparent at lateral aspect, no erythema or tenderness.  Lab Results:  Results for orders placed or performed in visit on 05/20/14  CBC with Differential  Result Value Ref Range   WBC 7.3 3.9 - 10.3 10e3/uL   NEUT# 5.1 1.5 - 6.5 10e3/uL   HGB 11.4 (L) 11.6 - 15.9 g/dL   HCT 35.3 34.8 - 46.6 %   Platelets 157 145 - 400 10e3/uL   MCV 94.6 79.5 - 101.0 fL   MCH 30.6 25.1 - 34.0 pg   MCHC 32.3 31.5 - 36.0 g/dL   RBC 3.73 3.70 - 5.45 10e6/uL   RDW 15.6 (H) 11.2 - 14.5 %   lymph# 1.1 0.9 - 3.3 10e3/uL   MONO# 0.9 0.1 - 0.9 10e3/uL   Eosinophils Absolute 0.2 0.0 - 0.5 10e3/uL   Basophils Absolute 0.0 0.0 - 0.1 10e3/uL   NEUT% 70.0 38.4 - 76.8 %   LYMPH% 15.7 14.0 - 49.7 %   MONO% 11.8 0.0 - 14.0 %   EOS% 2.2 0.0 - 7.0 %   BASO% 0.3 0.0 - 2.0 %    CMET available after visit K 3.5, normal LFTs, creatinine stable in good range, Ca not elevated.  UA available after visit entirely unremarkable and I have cancelled urine culture today.   Studies/Results:  No results found. PET planned 05-29-14.  Medications: I have reviewed the patient's current medications. Continue ativan 0.5 mg before meals next 2 days, then can try  without if not needed. Add daily OTC prilosec or zantac. We will let her know re oral K, which she has held with nausea for past few days and can continue to hold for now  DISCUSSION: etiology of low grade nausea not clear, with other negatives on evaluation today which we will communicate to patient. Plan as above, follow up repeat scans upcoming, I will see her between scans and Dr Leone Brand apt. Patient to call later this week to let us know how she is.   Assessment/Plan: 1.Endometrial carcinoma initially IA grade 1 at surgery 11-2010, then multifocal recurrence in pelvis found April 2015. Partial response by CT after 9 cycles of carboplatin and taxol, now planning 3 cycles doxil and follow up with PET. Cycle 1 doxil 6-96-29 complicated by infusion reaction, no problems with cycle 2 after premeds. Cycle 3 given 05-06-14, PET upcoming. 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 LV strain mentioned, report sent to PCP. 4.low grade nausea: etiology not clear, possibly related to BBQ just prior to cycle 3 doxil. UA ok, LFTs etc ok as noted above. Will continue ativan 0.5 mg 30 min prior to meals next 2 days then  try without, add OTC zantac or equivalent. She will let us know how she is later this week. She is known to Dr Darlis Loan of GI. 5.diverticulosis: not symptomatic  6.hypokalemia now off diuretic K low normal and that tablet always difficult for her to tolerate so will hold for now.  7.PAC in. Expect sutures will disintegrate and come out. 8.mild chemo anemia 9.fatty liver and obesity   Pateint and husband are comfortable with discussion and understand recommendations and plans. TIme spent 25 min including >50% counseling and coordination of care. Cc this note Drs Skeet Latch and Duwayne Heck, MD   05/20/2014, 9:05 AM

## 2014-05-20 NOTE — Patient Instructions (Signed)
Start over the counter zantac or prilosec one daily for now. Fine to continue 1/2 ativan 30 min before meals next couple of days, then see if you may not need it regularly

## 2014-05-20 NOTE — Telephone Encounter (Signed)
Patient called requesting results for urinalysis. Message forwarded to MD Marko Plume and RN.

## 2014-05-20 NOTE — Telephone Encounter (Signed)
-----   Message from Gordy Levan, MD sent at 05/20/2014 10:23 AM EDT ----- Labs seen and need follow up: please let her know chemistries today do not show anything obviously causing the nausea, including normal liver function tests, normal calcium and normal kidney function. Her potassium is low normal at 3.5, so ok to stay off potassium tablets for now. We still need to let her know about urine from today when available.

## 2014-05-27 ENCOUNTER — Ambulatory Visit: Payer: Medicare Other

## 2014-05-27 ENCOUNTER — Telehealth: Payer: Self-pay

## 2014-05-27 DIAGNOSIS — R110 Nausea: Secondary | ICD-10-CM

## 2014-05-27 DIAGNOSIS — C541 Malignant neoplasm of endometrium: Secondary | ICD-10-CM

## 2014-05-27 MED ORDER — PROCHLORPERAZINE MALEATE 10 MG PO TABS: 10.0000 mg | ORAL_TABLET | Freq: Four times a day (QID) | ORAL | Status: DC | PRN

## 2014-05-27 NOTE — Telephone Encounter (Signed)
Told Erika Strong that Dr. Marko Plume suggested compazine 10 mg every 6 hrs prn. She has her scans this week, so maybe shewill be able to tell anything that is contributing to her nausea.

## 2014-05-27 NOTE — Addendum Note (Signed)
Addended by: Baruch Merl on: 05/27/2014 05:48 PM   Modules accepted: Orders

## 2014-05-27 NOTE — Telephone Encounter (Signed)
Erika Strong states that this is the 3 week post chemotherapy and she is still experiencing nausea in the am. She takes the pepcid [facility] in am with crackers. She then experiences some nausea and takes 0.5 mg atiavan sl.  This curbs the nausea most of the time. She is wondering if she should take another medication for nausea? A neighbor had a virus at the end of FEB. and she visited her not knowing that she was sick.  This is about the time this nausea began.

## 2014-05-29 ENCOUNTER — Ambulatory Visit (HOSPITAL_COMMUNITY)
Admission: RE | Admit: 2014-05-29 | Discharge: 2014-05-29 | Disposition: A | Discharge status: Home / Self Care | Payer: Medicare Other | Source: Ambulatory Visit | Attending: Oncology | Admitting: Oncology

## 2014-05-29 DIAGNOSIS — C541 Malignant neoplasm of endometrium: Secondary | ICD-10-CM | POA: Diagnosis present

## 2014-05-29 LAB — GLUCOSE, CAPILLARY: GLUCOSE-CAPILLARY: 96 mg/dL (ref 70–99)

## 2014-05-29 MED ORDER — FLUDEOXYGLUCOSE F - 18 (FDG) INJECTION
10.5000 | Freq: Once | INTRAVENOUS | Status: AC | PRN
  Administered 2014-05-29: 10.5 via INTRAVENOUS

## 2014-06-03 ENCOUNTER — Other Ambulatory Visit: Payer: Self-pay | Admitting: Oncology

## 2014-06-03 ENCOUNTER — Telehealth: Payer: Self-pay | Admitting: *Deleted

## 2014-06-03 ENCOUNTER — Encounter: Payer: Self-pay | Admitting: Oncology

## 2014-06-03 NOTE — Telephone Encounter (Signed)
Called Mrs. P[place]ignot after hearing voicemail reporting "Had a problem trying to go off Ativan".  Reports taking ativan 1 mg every 28-days or the day of her treatments until March 17th.  "I may have picked up a stomach virus but I stayed nauseated and had to take ativan 0.5 mg every morning and sometimes up to three times a day.  I never vomited but stayed nauseated.  The nausea has finally passed so I did not take any ativan this past Friday or Saturday.  Sunday I felt my heart racing with elevated B/P and heart rate = 90.  I called Triage nurse and was told to call Dr. Marko Plume for a schedule to stop ativan because ativan is not a medicine you can quit cold [place].  I was instructed to take 0.5 mg twice yesterday and to call [facility] back if no improvement.  My B/P and heart rate went down and I felt better.  Have taken 0.5 mg at 0630 and will repeat again at 6:30 pm.  I'm scheduled to see Dr. Marko Plume on 06-06-2014 and she can tell me now or wait till the visit."  Denies any dizziness, Headaches, hallucinations, trouble sleeping etc.  fReturn number is [phone].

## 2014-06-03 NOTE — Progress Notes (Signed)
Medical Oncology  This is reply to call documentation by Opal Sidles 06-03-14:  Please have patient decrease ativan to 0.25 mg bid x 2 days then 0.25 mg daily x 2 days then DC. Remind her to drink plenty of non-caffeinated fluids.  Godfrey Pick, MD

## 2014-06-05 ENCOUNTER — Other Ambulatory Visit: Payer: Self-pay

## 2014-06-05 ENCOUNTER — Other Ambulatory Visit: Payer: Self-pay | Admitting: Oncology

## 2014-06-05 DIAGNOSIS — C541 Malignant neoplasm of endometrium: Secondary | ICD-10-CM

## 2014-06-05 NOTE — Telephone Encounter (Signed)
Called patient with Dr. Mariana Kaufman directions to Force fluids avoiding caffeine and take one fourth or 0.25 mg twice daily for two days, then take 0.25 mg once daily for two days and d/c.  Verbalized understanding.  Reports she has used a knife to split ativan in half.  Has taken 0.5 mg twice daily since we spoke on 06-03-2014 and will begin the 0.25 mg tomorrow.  Reports she has avoided caffeinated beverages for the past year.

## 2014-06-06 ENCOUNTER — Other Ambulatory Visit (HOSPITAL_BASED_OUTPATIENT_CLINIC_OR_DEPARTMENT_OTHER): Payer: Medicare Other

## 2014-06-06 ENCOUNTER — Other Ambulatory Visit: Payer: Self-pay

## 2014-06-06 ENCOUNTER — Ambulatory Visit (HOSPITAL_COMMUNITY)
Admission: RE | Admit: 2014-06-06 | Discharge: 2014-06-06 | Disposition: A | Discharge status: Home / Self Care | Payer: Medicare Other | Source: Ambulatory Visit | Attending: Oncology | Admitting: Oncology

## 2014-06-06 ENCOUNTER — Ambulatory Visit (HOSPITAL_BASED_OUTPATIENT_CLINIC_OR_DEPARTMENT_OTHER): Payer: Medicare Other

## 2014-06-06 ENCOUNTER — Other Ambulatory Visit: Payer: Self-pay | Admitting: Oncology

## 2014-06-06 ENCOUNTER — Ambulatory Visit (HOSPITAL_BASED_OUTPATIENT_CLINIC_OR_DEPARTMENT_OTHER): Payer: Medicare Other | Admitting: Oncology

## 2014-06-06 ENCOUNTER — Encounter: Payer: Self-pay | Admitting: Oncology

## 2014-06-06 ENCOUNTER — Other Ambulatory Visit: Payer: Self-pay | Admitting: *Deleted

## 2014-06-06 ENCOUNTER — Emergency Department (HOSPITAL_COMMUNITY)
Admission: EM | Admit: 2014-06-06 | Discharge: 2014-06-06 | Disposition: A | Discharge status: Home / Self Care | Payer: Medicare Other | Attending: Emergency Medicine | Admitting: Emergency Medicine

## 2014-06-06 VITALS — BP 100/51 | HR 60 | Temp 98.6°F | Resp 18 | Ht 66.0 in | Wt 210.3 lb

## 2014-06-06 DIAGNOSIS — R0602 Shortness of breath: Secondary | ICD-10-CM | POA: Insufficient documentation

## 2014-06-06 DIAGNOSIS — C541 Malignant neoplasm of endometrium: Secondary | ICD-10-CM | POA: Insufficient documentation

## 2014-06-06 DIAGNOSIS — I10 Essential (primary) hypertension: Secondary | ICD-10-CM | POA: Diagnosis not present

## 2014-06-06 DIAGNOSIS — R599 Enlarged lymph nodes, unspecified: Secondary | ICD-10-CM

## 2014-06-06 DIAGNOSIS — R55 Syncope and collapse: Secondary | ICD-10-CM | POA: Diagnosis not present

## 2014-06-06 DIAGNOSIS — E785 Hyperlipidemia, unspecified: Secondary | ICD-10-CM | POA: Diagnosis not present

## 2014-06-06 DIAGNOSIS — C549 Malignant neoplasm of corpus uteri, unspecified: Secondary | ICD-10-CM

## 2014-06-06 DIAGNOSIS — Z95828 Presence of other vascular implants and grafts: Secondary | ICD-10-CM

## 2014-06-06 DIAGNOSIS — Z79899 Other long term (current) drug therapy: Secondary | ICD-10-CM | POA: Diagnosis not present

## 2014-06-06 DIAGNOSIS — Z8542 Personal history of malignant neoplasm of other parts of uterus: Secondary | ICD-10-CM | POA: Insufficient documentation

## 2014-06-06 DIAGNOSIS — D6481 Anemia due to antineoplastic chemotherapy: Secondary | ICD-10-CM

## 2014-06-06 DIAGNOSIS — R110 Nausea: Secondary | ICD-10-CM

## 2014-06-06 DIAGNOSIS — Z862 Personal history of diseases of the blood and blood-forming organs and certain disorders involving the immune mechanism: Secondary | ICD-10-CM | POA: Diagnosis not present

## 2014-06-06 DIAGNOSIS — C7989 Secondary malignant neoplasm of other specified sites: Secondary | ICD-10-CM

## 2014-06-06 DIAGNOSIS — Z9221 Personal history of antineoplastic chemotherapy: Secondary | ICD-10-CM | POA: Insufficient documentation

## 2014-06-06 DIAGNOSIS — Z8719 Personal history of other diseases of the digestive system: Secondary | ICD-10-CM | POA: Diagnosis not present

## 2014-06-06 DIAGNOSIS — N390 Urinary tract infection, site not specified: Secondary | ICD-10-CM

## 2014-06-06 DIAGNOSIS — Z90710 Acquired absence of both cervix and uterus: Secondary | ICD-10-CM | POA: Insufficient documentation

## 2014-06-06 DIAGNOSIS — Z8639 Personal history of other endocrine, nutritional and metabolic disease: Secondary | ICD-10-CM | POA: Diagnosis not present

## 2014-06-06 LAB — COMPREHENSIVE METABOLIC PANEL (CC13)
ALBUMIN: 3.2 g/dL — AB (ref 3.5–5.0)
ALK PHOS: 73 U/L (ref 40–150)
ALT: 19 U/L (ref 0–55)
AST: 27 U/L (ref 5–34)
Anion Gap: 10 mEq/L (ref 3–11)
BUN: 9.8 mg/dL (ref 7.0–26.0)
CALCIUM: 9.4 mg/dL (ref 8.4–10.4)
CHLORIDE: 102 meq/L (ref 98–109)
CO2: 25 mEq/L (ref 22–29)
Creatinine: 0.8 mg/dL (ref 0.6–1.1)
EGFR: 81 mL/min/{1.73_m2} — ABNORMAL LOW (ref >90)
Glucose: 100 mg/dl (ref 70–140)
Potassium: 4.2 mEq/L (ref 3.5–5.1)
SODIUM: 137 meq/L (ref 136–145)
Total Bilirubin: 0.48 mg/dL (ref 0.20–1.20)
Total Protein: 6.4 g/dL (ref 6.4–8.3)

## 2014-06-06 LAB — CBC WITH DIFFERENTIAL/PLATELET
BASO%: 0.7 % (ref 0.0–2.0)
Basophils Absolute: 0.1 10*3/uL (ref 0.0–0.1)
EOS%: 1.5 % (ref 0.0–7.0)
Eosinophils Absolute: 0.1 10*3/uL (ref 0.0–0.5)
HCT: 35.4 % (ref 34.8–46.6)
HGB: 11.5 g/dL — ABNORMAL LOW (ref 11.6–15.9)
LYMPH%: 18.8 % (ref 14.0–49.7)
MCH: 29.5 pg (ref 25.1–34.0)
MCHC: 32.4 g/dL (ref 31.5–36.0)
MCV: 91.2 fL (ref 79.5–101.0)
MONO#: 1.6 10*3/uL — ABNORMAL HIGH (ref 0.1–0.9)
MONO%: 19 % — ABNORMAL HIGH (ref 0.0–14.0)
NEUT#: 5.1 10*3/uL (ref 1.5–6.5)
NEUT%: 60 % (ref 38.4–76.8)
PLATELETS: 185 10*3/uL (ref 145–400)
RBC: 3.88 10*6/uL (ref 3.70–5.45)
RDW: 17.6 % — ABNORMAL HIGH (ref 11.2–14.5)
WBC: 8.5 10*3/uL (ref 3.9–10.3)
lymph#: 1.6 10*3/uL (ref 0.9–3.3)

## 2014-06-06 LAB — WHOLE BLOOD GLUCOSE
Glucose: 105 mg/dL — ABNORMAL HIGH (ref 70–100)
HRS PC: 3 Hours

## 2014-06-06 LAB — CBG MONITORING, ED: Glucose-Capillary: 95 mg/dL (ref 70–99)

## 2014-06-06 MED ORDER — HEPARIN SOD (PORK) LOCK FLUSH 100 UNIT/ML IV SOLN
500.0000 [IU] | Freq: Once | INTRAVENOUS | Status: AC
  Administered 2014-06-06: 500 [IU] via INTRAVENOUS
  Filled 2014-06-06: qty 5

## 2014-06-06 MED ORDER — IOHEXOL 350 MG/ML SOLN
100.0000 mL | Freq: Once | INTRAVENOUS | Status: AC | PRN
  Administered 2014-06-06: 100 mL via INTRAVENOUS

## 2014-06-06 MED ORDER — SODIUM CHLORIDE 0.9 % IJ SOLN
10.0000 mL | INTRAMUSCULAR | Status: DC | PRN
  Administered 2014-06-06: 10 mL via INTRAVENOUS
  Filled 2014-06-06: qty 10

## 2014-06-06 NOTE — Discharge Instructions (Signed)
Home. Push fluids and stay hydrated.  Don't skip meals!!

## 2014-06-06 NOTE — ED Notes (Signed)
Pt arrived from [facility] related to sudden onset of generalized weakness and diaphoresis. Symptoms resolved after drinking Sprite. Pt alert x4 MAE randomly. Skin w/d.

## 2014-06-06 NOTE — ED Provider Notes (Signed)
CSN: [id number]     Arrival date & time 06/06/14  1317 History   First MD Initiated Contact with Patient 06/06/14 1339     Chief Complaint  Patient presents with  . Near Syncope     HPI  Patient presents evaluation nursing will episode. She does warning. [place] follow-up office at her 75 office was that she started feeling weak and dizzy lightheaded like she needed to eat. Another hour past. She states she told the nurse are structures going to pass out and sweaty and diaphoretic.. Given some Sprite. States she felt instantly better. Dr. wanted her evaluated in the emergency room. Has been asymptomatic since taking by mouth.  Past Medical History  Diagnosis Date  . Wears glasses   . Hypertension   . Diverticulitis 2010  . Hyperlipidemia     2007  . Cancer   . Endometrial cancer 11/02/2010  . Malignant neoplasm of corpus uteri, except isthmus 01/21/2011  . Anemia due to chemotherapy 01/23/2014  . Hepatic steatosis 02/12/2014   Past Surgical History  Procedure Laterality Date  . Gonadectomy and hysterectomy  11/24/2010    Endometrial cancer, performed by Dr. Janie Morning  . Mole removal  Nov 2012  . Abdominal hysterectomy  11/24/2010    RTLH, BSO, RPLND, LPLNS   Family History  Problem Relation Age of Onset  . Lung cancer Father     smoker  . Cancer Father   . Osteoporosis Mother   . Rheum arthritis Mother   . Cancer Mother    History  Substance Use Topics  . Smoking status: Never Smoker   . Smokeless tobacco: Never Used  . Alcohol Use: No   OB History    Gravida Para Term Preterm AB TAB SAB Ectopic Multiple Living   1 1 1       1      Review of Systems  Constitutional: Negative for fever, chills, diaphoresis, appetite change and fatigue.  HENT: Negative for mouth sores, sore throat and trouble swallowing.   Eyes: Negative for visual disturbance.  Respiratory: Negative for cough, chest tightness, shortness of breath and wheezing.   Cardiovascular:  Negative for chest pain.  Gastrointestinal: Negative for nausea, vomiting, abdominal pain, diarrhea and abdominal distention.  Endocrine: Negative for polydipsia, polyphagia and polyuria.  Genitourinary: Negative for dysuria, frequency and hematuria.  Musculoskeletal: Negative for gait problem.  Skin: Negative for color change, pallor and rash.  Neurological: Positive for dizziness and light-headedness. Negative for syncope and headaches.  Hematological: Does not bruise/bleed easily.  Psychiatric/Behavioral: Negative for behavioral problems and confusion.      Allergies  Granix; Atorvastatin; Fish allergy; Lisinopril; Pravastatin sodium; and Simvastatin  Home Medications   Prior to Admission medications   Medication Sig Start Date End Date Taking? Authorizing Provider  amLODipine (NORVASC) 2.5 MG tablet Take 1 tablet (2.5 mg total) by mouth daily. 03/25/14  Yes Hali Marry, MD  Ascorbic Acid (VITAMIN C CR) 1000 MG TBCR Take 1,000 mg by mouth daily.   Yes Historical Provider, MD  B Complex-C (B-COMPLEX WITH VITAMIN C) tablet Take 1 tablet by mouth daily.   Yes Historical Provider, MD  lidocaine-prilocaine (EMLA) cream Apply 1 application topically as needed. Apply to port 1 hour prior to lab draw or chemo 02/28/14  Yes Lennis P Livesay, MD  LORazepam (ATIVAN) 1 MG tablet 1/2 to 1 tablet every 6 hours as needed for nausea. This will make you drowsy. Can swallow pill or let dissolve under tongue. Night of  chemo, whether or not any nausea, take at bedtime Patient taking differently: Take 0.25-1 mg by mouth every 6 (six) hours as needed (nausea). This will make you drowsy. Can swallow pill or let dissolve under tongue. Night of chemo, whether or not any nausea, take at bedtime 05/20/14  Yes Lennis Marion Downer, MD  OVER THE COUNTER MEDICATION Take 1 tablet by mouth daily. Curamin   Yes Historical Provider, MD  OVER THE COUNTER MEDICATION Take 3 tablets by mouth daily. [place] Bone Strength  (calcium product)   Yes Historical Provider, MD  OVER THE COUNTER MEDICATION Take 1 packet by mouth 2 (two) times daily. [place] Fleeting Vitamin Pak (contains fish oil, potassium and multi vitamins)   Yes Historical Provider, MD  OVER THE COUNTER MEDICATION Take 3 tablets by mouth 3 (three) times daily. Intestinal Sooth and Build   Yes Historical Provider, MD  OVER THE COUNTER MEDICATION Take 2 tablets by mouth 2 (two) times daily. Stress J   Yes Historical Provider, MD  OVER THE COUNTER MEDICATION Take 1 tablet by mouth daily. Liver Care   Yes Historical Provider, MD  OVER THE COUNTER MEDICATION Take 10 mLs by mouth daily with breakfast. Liquid chlorophyl   Yes Historical Provider, MD  OVER THE COUNTER MEDICATION Take 30 mLs by mouth daily with breakfast. Flax Seed Oil   Yes Historical Provider, MD  potassium chloride 20 MEQ/15ML (10%) SOLN Take 22.5 mLs (30 mEq total) by mouth 3 (three) times daily. Patient taking differently: Take 22.5 mEq by mouth daily.  01/26/14  Yes Barton Dubois, MD  Probiotic Product (PROBIOTIC DAILY) CAPS Take 1 capsule by mouth daily.   Yes Historical Provider, MD  pyridOXINE (VITAMIN B-6) 100 MG tablet Take 100 mg by mouth daily.   Yes Historical Provider, MD  Ubiquinol 100 MG CAPS Take 100 mg by mouth daily with breakfast.    Yes Historical Provider, MD  acyclovir ointment (ZOVIRAX) 5 % Apply to fever blister 5 times a day Patient not taking: Reported on 06/06/2014 07/30/13   Lennis P Livesay, MD  ondansetron (ZOFRAN) 8 MG tablet 1-2 tablets every 12 hours as needed for nausea. Take 1 tablet the AM after chemo whether or not any nausea. This will not make you drowsy. Patient not taking: Reported on 06/06/2014 02/28/14   Gordy Levan, MD  prochlorperazine (COMPAZINE) 10 MG tablet Take 1 tablet (10 mg total) by mouth every 6 (six) hours as needed for nausea or vomiting. Patient not taking: Reported on 06/06/2014 05/27/14   Lennis P Livesay, MD   BP 132/67 mmHg  Pulse 73   Temp(Src) 98.8 F (37.1 C)  Resp 23  Wt 210 lb (95.255 kg)  SpO2 97% Physical Exam  Constitutional: She is oriented to person, place, and time. She appears well-developed and well-nourished. No distress.  HENT:  Head: Normocephalic.  Eyes: Conjunctivae are normal. Pupils are equal, round, and reactive to light. No scleral icterus.  Neck: Normal range of motion. Neck supple. No thyromegaly present.  Cardiovascular: Normal rate and regular rhythm.  Exam reveals no gallop and no friction rub.   No murmur heard. Pulmonary/Chest: Effort normal and breath sounds normal. No respiratory distress. She has no wheezes. She has no rales.  Abdominal: Soft. Bowel sounds are normal. She exhibits no distension. There is no tenderness. There is no rebound.  Musculoskeletal: Normal range of motion.  Neurological: She is alert and oriented to person, place, and time.  Skin: Skin is warm and dry. No  rash noted.  Psychiatric: She has a normal mood and affect. Her behavior is normal.    ED Course  Procedures (including critical care time) Labs Review Labs Reviewed  CBG MONITORING, ED    Imaging Review No results found.   EKG Interpretation None     Sinus rhythm on the monitor. MDM   Final diagnoses:  Near syncope    A symptomatically here. Essentially cured with a drink of Sprite at her physician's office. Normal labs were done just prior to arrival here. I think should appropriate for outpatient treatment follow up only as needed.    Tanna Furry, MD 06/06/14 1504

## 2014-06-06 NOTE — Patient Instructions (Signed)

## 2014-06-06 NOTE — ED Notes (Signed)
MD at bedside. 

## 2014-06-06 NOTE — Progress Notes (Signed)
OFFICE PROGRESS NOTE   June 06, 2014   Physicians:Brewster, Abigail Butts; Madilyn Fireman, Barnetta Chapel; Ferdinand Lango, Louie Casa ([facility])  INTERVAL HISTORY:  Patient is seen, together with husband, to discuss results of restaging PET done 05-29-14 after 3 cycles of doxil for recurrent endometrial cancer involving omentum and pelvis. Last doxil was 05-06-14. Today patient complains of feeling generally weak intermittently since ~ 06-01-14, then was diaphoretic and nearly syncopal in exam room. Blood pressure supine was 100/55 with HR in 80s, blood sugar 105. She will be taken to ED for further evaluation. She needs cardiac evaluation, may need CT angio chest; may need imaging of head if those are not causing symptoms.  She has had intermittent nausea for several weeks, for which she was using occasional ativan. Nausea has resolved in last week with increased senokot and bowels now moving daily. She has noticed slight central chest soreness with deep breathing, no cough, no fever at home in past week, denies SOB. She felt generally weak on 06-01-14, spoke with nurse on call and concern was possible withdrawal from ativan, tho she had not used ativan much or regularly. She describes the weakness as generalized, able to walk in house but not able to walk her dog as usual outdoors, stayed in bed much of 06-01-14.  She denies HA or other focal neurologic symptoms. She ate breakfast well this AM and has had snack since then. She had not been pushing fluids up until last 24 hours. She denies other localized symptoms of infection   PET 05-29-14 had some patchy lower lung findings bilaterally of uncertain significance, and slight increase in omental mass now 3.1 x 2.4 cm with SUV max 9.25, and slight increase in right pelvic sidewall and right obturator involvement, all small. Single area in right subtrochanteric area noted on PET. Other than possibly the lung findings, none of above PET changes obviously related to recent nausea or near  syncope today.    PAC in Flu vaccine done  ONCOLOGIC HISTORY Patient presented with vaginal spotting 07-2010. Endometrial biopsy by Dr Clovia Cuff 10-21-10 revealed endometroid carcinoma grade 1 focally involving polyp. She had robotic assisted total laparoscopic hysterectomy BSO and 20 pelvic node evaluation by Dr Skeet Latch on 11-24-2010. Pathology [phone]) had endometrial adenocarcinoma IA FIGO grade I with focal squamous differentiation, superficial myometrial invasion ( 0.2 cm where myometrium 1.4 cm); benign cervix, tubes and ovaries; no LVSI. With no high risk features, she was followed with observation only, including exams by gyn oncology every 6 months.  In early March 2015 she had acute onset of lower abdominal pain, then fever and nausea over next several days. She was treated by PCP for presumed diverticulitis with cipro and flagyl, initially with improvement then symptoms recurrent in late March. She had CT AP 05-23-13 showed a 4.1x4.1 cm mass left anterior upper pelvis, and CT biopsy 05-31-13 of mesenteric mass at level of umbilicus showed metastatic adenocarcinoma consistent with endometrioid carcinoma. Patient saw Dr Skeet Latch at [facility] on 06-08-13, with exam remarkable for palpable right pelvic mass and recommendation for 3 cycles of taxane / platin chemotherapy, then repeat imaging; may also consider radiation after chemotherapy. The initial abdominal pain resolved entirely. Cycle 1 taxol carboplatin was given 06-26-2013; she was neutropenic with ANC 0 on day 14. Repeat CT AP after cycle 3 had partial response of dominant ventral peritoneal mass, right pelvic sidewall mass and implant along sigmoid colon. She completed additional 3 cycles of carboplatin and taxol for total 6 cycles on 10-18-13, with  neulasta support. Restaging CT showed partial response but still residual involvement including intraperitoneal disease, with additional 3 cycles carbo taxol recommended. She had cycle 1 day 1 dose dense  carbo taxol on 11-26-13 and completed 3 cycles on 01-21-14. CT 02-08-14 after this total 9 cycles carboplatin taxol had significant improvement but still persistent disease. She began doxil on 03-11-14, cycle 3 on 05-06-14, tolerated well. Echocardiogram 02-20-14 had EF 55-60%.       Review of systems as above, also: No bladder symptoms. No LE swelling. No bleeding.  Remainder of 10 point Review of Systems negative.  Objective:  Vital signs in last 24 hours: BP on arrival to office 131/77 with HR 82, resp 18, Temp 99.5 (wearing mask). Exam done in part prior to near syncope, which happened as patient was sitting on exam table with legs down. Repeat vitals below after near syncope, done supine: BP 100/51 mmHg  Pulse 60  Temp(Src) 98.6 F (37 C) (Oral)  Resp 18  Ht 5\' 6"  (1.676 m)  Wt 210 lb 4.8 oz (95.391 kg)  BMI 33.96 kg/m2  SpO2 93%  Alert, oriented and appropriate. History a little vague prior to the near syncope. Clammy skin then very diaphoretic with near syncope.  HEENT:PERRL, sclerae not icteric. Oral mucosa moist without lesions, posterior pharynx dull erythema without exudate. Neck supple. No JVD.  Lymphatics:no cervical,supraclavicular or inguinal adenopathy Resp: Minimal crackles in bases  bilaterally and normal percussion bilaterally Cardio: regular rate and rhythm. No gallop. GI: soft, nontender, not distended, no mass or organomegaly. A few bowel sounds. Surgical incision not remarkable. Musculoskeletal/ Extremities: without pitting edema, cords, tenderness Neuro: Speech fluent and appropirate. No seizure activity. Responded verbally slowly when event first happened. Moves all extremities. Able to move onto stretcher from exam table. Skin without rash, ecchymosis, petechiae  Portacath-without erythema or tenderness. Flushed today  Lab Results:  Results for orders placed or performed in visit on 06/06/14  CBC with Differential  Result Value Ref Range   WBC 8.5 3.9  - 10.3 10e3/uL   NEUT# 5.1 1.5 - 6.5 10e3/uL   HGB 11.5 (L) 11.6 - 15.9 g/dL   HCT 35.4 34.8 - 46.6 %   Platelets 185 145 - 400 10e3/uL   MCV 91.2 79.5 - 101.0 fL   MCH 29.5 25.1 - 34.0 pg   MCHC 32.4 31.5 - 36.0 g/dL   RBC 3.88 3.70 - 5.45 10e6/uL   RDW 17.6 (H) 11.2 - 14.5 %   lymph# 1.6 0.9 - 3.3 10e3/uL   MONO# 1.6 (H) 0.1 - 0.9 10e3/uL   Eosinophils Absolute 0.1 0.0 - 0.5 10e3/uL   Basophils Absolute 0.1 0.0 - 0.1 10e3/uL   NEUT% 60.0 38.4 - 76.8 %   LYMPH% 18.8 14.0 - 49.7 %   MONO% 19.0 (H) 0.0 - 14.0 %   EOS% 1.5 0.0 - 7.0 %   BASO% 0.7 0.0 - 2.0 %  Blood sugar 105 just after near syncope    Note unremarkable UA 05-20-14    Studies/Results: NUCLEAR MEDICINE PET SKULL BASE TO THIGH  05-29-14  TECHNIQUE: 10.5 mCi F-18 FDG was injected intravenously. Full-ring PET imaging was performed from the skull base to thigh after the radiotracer. CT data was obtained and used for attenuation correction and anatomic localization.  FASTING BLOOD GLUCOSE: Value: 96 mg/dl  COMPARISON: CT scans 11/15/2013 and 02/08/2014  FINDINGS: NECK  No hypermetabolic lymph nodes in the neck.  CHEST  No hypermetabolic mediastinal or hilar nodes. No suspicious pulmonary  nodules on the CT scan. There is patchy hypermetabolism noted in the lower lung zones bilaterally which is probably inflammation and/or atelectasis I do not see any focal airspace consolidation on the CT scan or worrisome pulmonary lesions. No pleural effusion.  ABDOMEN/PELVIS  Interval enlargement of the omental mass located just below the level of the iliac crest anteriorly. On the prior CT scan it measured 24 x 17.5 mm and now measures 31 x 24 mm. It is metabolically active with SUV max of 9.25.  There is a right pelvic sidewall soft tissue mass which is also enlarged since the prior CT scan. It measures approximately 15.5 x 13.5 mm and SUV max is 8.2.  Soft tissue mass in the obturator region on  the right side has enlarged since the prior CT scan. It measures approximately 20 x 14 mm and is hypermetabolic with SUV max of 8.5.  Small, weakly positive inguinal lymph nodes are noted bilaterally. These are likely inflammatory and not metastatic disease.  SKELETON  There is a single hypermetabolic lesion in the subtrochanteric region of the right femur. SUV max is 3.25. There appears to be slight increased marrow attenuation on the CT scan. I believe this was present on prior CT scans. It may be a benign process such is fibrous dysplasia as I do not see any other definite bone lesions. Surveillance is recommended.  IMPRESSION: 1. Enlarging hypermetabolic pelvic lesions as discussed above suggesting progressive disease when compared to prior CT scan. 2. Probable inflammatory changes in the lungs but no definite metastatic lesions. 3. Single hypermetabolic bone lesion in the right femur. Recommend Surveillance.   I had told patient and husband just briefly that PET shows some slight progression, but we did not discuss in detail with other problems now.   Medications: I have reviewed the patient's current medications.   Assessment/Plan: 1.Near syncope at office now: in setting of several weeks of low grade nausea, general weakness/ decreased activity tolerance, some central chest soreness recently on deep inspiration, occasional low grade temperature: not clear that this is related to minimal progression of recurrent endometrial cancer. I am concerned about possible pulmonary emboli vs cardiac; if no findings with those, would suggest scan head. Transferred to ED. I have spoken with triage at ED now.  2.Recurrent endometrial carcinoma: progression after initial 9 cycles of carboplatin taxol and most recently 3 cycles doxil from 03-11-14 thru 05-06-14. Still fairly minimal and likely asymptomatic disease in lower abdomen and pelvis. She is to see Dr Skeet Latch of gyn oncology on  06-13-14. 3.PAC in 4.hx HTN 5.hx diverticulosis not symptomatic 6.mild chemo anemia stable, other counts ok.      Addendum: Patient seen in ED and released without further workup. ED note mentions that problem resolved entirely after she drank Sprite and that cardiac monitor in ED was not remarkable; no other labs done. BP in ED 132/67, HR 73, O2 sat 97%. This MD did speak directly with ED triage as patient being transported to ED, but did not speak directly with ED physician.  Due to acute problem in office earlier today, stat CT angio chest was then ordered by this MD. Patient was seen back at [facility] following that study, which fortunately did not find any pulmonary emboli. She was again accompanied by husband. Patient awake and alert when seen back at office, not diaphoretic, drinking fluids. Speech fluent and appropriate, no obvious neuro concerns now several hours further out from ativan. We discussed results of CT angio chest. She  and husband understand that if near syncope recurs, they should call 911 and go to ED again.  I have asked her to watch BP and HR at home, and to push fluids. She is to stop ativan now, really does not need taper after minimal, intermittent doses used in last few weeks.   We also had more detailed discussion of the PET, with slight increase in size and hypermetabolic uptake present in omental mass, right pelvic sidewall and obturator involvement, without clear new areas. She does have small, weakly + inguinal nodes and area in subtrochanteric region right femur of unclear significance. She has no RLE discomfort. I have told them that I do not feel these findings were related to recent several weeks of nausea or to the acute near syncopal episode today.  I have told them that we will be interested in Dr Leone Brand thoughts when she sees patient and this PET on 06-13-14. I have mentioned that directed RT might be consideration now, even with involvement in omentum, or  possibly hormonal blockade (note biopsy 05-31-13 was strongly ER and PR +).  Otherwise she may want to try third line chemotherapy. I will set up appointment back to this office after Dr Leone Brand recommendations available.  Note PAC flushed today, so will be due flush at least June 9.   All questions answered. Patient and husband express appreciation for care. Time spent >60 min total.   Glenroy Crossen P, MD   06/06/2014, 1:07 PM

## 2014-06-10 ENCOUNTER — Telehealth: Payer: Self-pay | Admitting: Oncology

## 2014-06-10 ENCOUNTER — Encounter (HOSPITAL_COMMUNITY): Payer: Self-pay | Admitting: Emergency Medicine

## 2014-06-10 ENCOUNTER — Other Ambulatory Visit: Payer: Self-pay | Admitting: Oncology

## 2014-06-10 ENCOUNTER — Telehealth: Payer: Self-pay | Admitting: *Deleted

## 2014-06-10 ENCOUNTER — Emergency Department (HOSPITAL_COMMUNITY)
Admission: EM | Admit: 2014-06-10 | Discharge: 2014-06-10 | Disposition: A | Discharge status: Home / Self Care | Payer: Medicare Other | Attending: Emergency Medicine | Admitting: Emergency Medicine

## 2014-06-10 DIAGNOSIS — R5383 Other fatigue: Secondary | ICD-10-CM | POA: Diagnosis not present

## 2014-06-10 DIAGNOSIS — Z8639 Personal history of other endocrine, nutritional and metabolic disease: Secondary | ICD-10-CM | POA: Insufficient documentation

## 2014-06-10 DIAGNOSIS — Z79899 Other long term (current) drug therapy: Secondary | ICD-10-CM | POA: Insufficient documentation

## 2014-06-10 DIAGNOSIS — Z8541 Personal history of malignant neoplasm of cervix uteri: Secondary | ICD-10-CM | POA: Insufficient documentation

## 2014-06-10 DIAGNOSIS — I10 Essential (primary) hypertension: Secondary | ICD-10-CM | POA: Insufficient documentation

## 2014-06-10 DIAGNOSIS — Z973 Presence of spectacles and contact lenses: Secondary | ICD-10-CM | POA: Diagnosis not present

## 2014-06-10 DIAGNOSIS — Z8719 Personal history of other diseases of the digestive system: Secondary | ICD-10-CM | POA: Diagnosis not present

## 2014-06-10 DIAGNOSIS — C541 Malignant neoplasm of endometrium: Secondary | ICD-10-CM

## 2014-06-10 DIAGNOSIS — R531 Weakness: Secondary | ICD-10-CM | POA: Diagnosis present

## 2014-06-10 LAB — I-STAT CHEM 8, ED
BUN: 8 mg/dL (ref 6–23)
CALCIUM ION: 1.27 mmol/L (ref 1.13–1.30)
CHLORIDE: 101 mmol/L (ref 96–112)
CREATININE: 0.7 mg/dL (ref 0.50–1.10)
GLUCOSE: 96 mg/dL (ref 70–99)
HEMATOCRIT: 40 % (ref 36.0–46.0)
Hemoglobin: 13.6 g/dL (ref 12.0–15.0)
POTASSIUM: 3.8 mmol/L (ref 3.5–5.1)
SODIUM: 139 mmol/L (ref 135–145)
TCO2: 25 mmol/L (ref 0–100)

## 2014-06-10 LAB — CBC WITH DIFFERENTIAL/PLATELET
BASOS ABS: 0 10*3/uL (ref 0.0–0.1)
Basophils Relative: 0 % (ref 0–1)
EOS ABS: 0.1 10*3/uL (ref 0.0–0.7)
EOS PCT: 2 % (ref 0–5)
HEMATOCRIT: 40.6 % (ref 36.0–46.0)
HEMOGLOBIN: 12.7 g/dL (ref 12.0–15.0)
LYMPHS ABS: 1.8 10*3/uL (ref 0.7–4.0)
Lymphocytes Relative: 24 % (ref 12–46)
MCH: 29.5 pg (ref 26.0–34.0)
MCHC: 31.3 g/dL (ref 30.0–36.0)
MCV: 94.2 fL (ref 78.0–100.0)
MONO ABS: 1.4 10*3/uL — AB (ref 0.1–1.0)
MONOS PCT: 18 % — AB (ref 3–12)
Neutro Abs: 4.2 10*3/uL (ref 1.7–7.7)
Neutrophils Relative %: 56 % (ref 43–77)
Platelets: 190 10*3/uL (ref 150–400)
RBC: 4.31 MIL/uL (ref 3.87–5.11)
RDW: 16.3 % — ABNORMAL HIGH (ref 11.5–15.5)
WBC: 7.5 10*3/uL (ref 4.0–10.5)

## 2014-06-10 MED ORDER — SODIUM CHLORIDE 0.9 % IV BOLUS (SEPSIS)
1000.0000 mL | Freq: Once | INTRAVENOUS | Status: AC
  Administered 2014-06-10: 1000 mL via INTRAVENOUS

## 2014-06-10 NOTE — Telephone Encounter (Signed)
Patient aware of her appointments,aware to call pcp

## 2014-06-10 NOTE — ED Notes (Signed)
Pt reports recently undergoing chemotherapy - admits to generalized weakness and nausea w/ subsequent poor appetite. Pt concerned she may be dehydrated.

## 2014-06-10 NOTE — Discharge Instructions (Signed)
Return to the emergency room with worsening of symptoms, new symptoms or with symptoms that are concerning , especially severe worsening of headache, visual or speech changes, weakness in face, arms or legs. Please call your doctor for a followup appointment within 24-48 hours. When you talk to your doctor please let them know that you were seen in the emergency department and have them acquire all of your records so that they can discuss the findings with you and formulate a treatment plan to fully care for your new and ongoing problems.  Drink plenty of fluids with electrolytes. BRAT diet: bananas, rice, applesauce, toast Follow up with your oncologist as scheduled Read below information and follow recommendations.  Fatigue Fatigue is a feeling of tiredness, lack of energy, lack of motivation, or feeling tired all the time. Having enough rest, good nutrition, and reducing stress will normally reduce fatigue. Consult your caregiver if it persists. The nature of your fatigue will help your caregiver to find out its cause. The treatment is based on the cause.  CAUSES  There are many causes for fatigue. Most of the time, fatigue can be traced to one or more of your habits or routines. Most causes fit into one or more of three general areas. They are: Lifestyle problems  Sleep disturbances.  Overwork.  Physical exertion.  Unhealthy habits.  Poor eating habits or eating disorders.  Alcohol and/or drug use .  Lack of proper nutrition (malnutrition). Psychological problems  Stress and/or anxiety problems.  Depression.  Grief.  Boredom. Medical Problems or Conditions  Anemia.  Pregnancy.  Thyroid gland problems.  Recovery from major surgery.  Continuous pain.  Emphysema or asthma that is not well controlled  Allergic conditions.  Diabetes.  Infections (such as mononucleosis).  Obesity.  Sleep disorders, such as sleep apnea.  Heart failure or other heart-related  problems.  Cancer.  Kidney disease.  Liver disease.  Effects of certain medicines such as antihistamines, cough and cold remedies, prescription pain medicines, heart and blood pressure medicines, drugs used for treatment of cancer, and some antidepressants. SYMPTOMS  The symptoms of fatigue include:   Lack of energy.  Lack of drive (motivation).  Drowsiness.  Feeling of indifference to the surroundings. DIAGNOSIS  The details of how you feel help guide your caregiver in finding out what is causing the fatigue. You will be asked about your present and past health condition. It is important to review all medicines that you take, including prescription and non-prescription items. A thorough exam will be done. You will be questioned about your feelings, habits, and normal lifestyle. Your caregiver may suggest blood tests, urine tests, or other tests to look for common medical causes of fatigue.  TREATMENT  Fatigue is treated by correcting the underlying cause. For example, if you have continuous pain or depression, treating these causes will improve how you feel. Similarly, adjusting the dose of certain medicines will help in reducing fatigue.  HOME CARE INSTRUCTIONS   Try to get the required amount of good sleep every night.  Eat a healthy and nutritious diet, and drink enough water throughout the day.  Practice ways of relaxing (including yoga or meditation).  Exercise regularly.  Make plans to change situations that cause stress. Act on those plans so that stresses decrease over time. Keep your work and personal routine reasonable.  Avoid street drugs and minimize use of alcohol.  Start taking a daily multivitamin after consulting your caregiver. SEEK MEDICAL CARE IF:   You have persistent  tiredness, which cannot be accounted for.  You have fever.  You have unintentional weight loss.  You have headaches.  You have disturbed sleep throughout the night.  You are feeling  sad.  You have constipation.  You have dry skin.  You have gained weight.  You are taking any new or different medicines that you suspect are causing fatigue.  You are unable to sleep at night.  You develop any unusual swelling of your legs or other parts of your body. SEEK IMMEDIATE MEDICAL CARE IF:   You are feeling confused.  Your vision is blurred.  You feel faint or pass out.  You develop severe headache.  You develop severe abdominal, pelvic, or back pain.  You develop chest pain, shortness of breath, or an irregular or fast heartbeat.  You are unable to pass a normal amount of urine.  You develop abnormal bleeding such as bleeding from the rectum or you vomit blood.  You have thoughts about harming yourself or committing suicide.  You are worried that you might harm someone else. MAKE SURE YOU:   Understand these instructions.  Will watch your condition.  Will get help right away if you are not doing well or get worse. Document Released: 12/06/2006 Document Revised: 05/03/2011 Document Reviewed: 06/12/2013 [facility] Patient Information 2015 [place], [place]. This information is not intended to replace advice given to you by your health care provider. Make sure you discuss any questions you have with your health care provider.

## 2014-06-10 NOTE — Telephone Encounter (Addendum)
CALLED PT. AGAIN. PT. TOOK A 1/4 OF ATIVAN MORNING AND EVENING ON Thursday AND Friday. SHE FELT FINE THOSE TWO DAYS. ON Saturday PT. TOOK A 1/4 OF ATIVAN IN THE MORNING. IN THE EVENING SHE STARTED FEELING "BAD AND WEAK". ON Sunday PT. TOOK HER LAST  1/4 OF ATIVAN IN THE MORNING. IN THE EVENING SHE STARTED FEELING "BAD AND WEAK". AT 5:30 THIS MORNING PT. WOKE UP FEELING "BAD AND WEAK". SHE CALL THE ON CALL TRIAGE NURSE WHO INSTRUCTED PT. TO CALL DR.LIVESAY'S OFFICE THIS MORNING. PT. LEFT A VOICE MAIL THIS MORNING ABOUT FEELING "BAD AND WEAK" BUT NOW SHE IS FEELING BETTER. PT. HAS BEEN EATING AND DRINKING WELL DURING THIS ENTIRE PROCESS.  THIS NOTE ROUTED TO [place] AND LOUISE ARCHAMBAULT,RN

## 2014-06-10 NOTE — Telephone Encounter (Addendum)
CALLED PT. TO GIVE DR.LIVESAY'S INSTRUCTIONS. 1) IF PT. FEELS "BAD AND WEAK" AGAIN SHE NEEDS TO GO TO THE ED TO BE EVALUATED. 2) DR. Marko Plume HAS ORDER A BRAIN SCAN. PT. WILL BE CALLED WITH THE APPOINTMENT. 3) PT. NEEDS TO SEE HER PCP, DR.METHENEY, THIS WEEK. 4) DR.LIVESAY DOES NOT THINK THE ATIVAN IS THE CAUSE OF THESE EPISODES. PT. TO STOP THE ATIVAN. PT. VOICES UNDERSTANDING.

## 2014-06-10 NOTE — ED Notes (Signed)
Pt ambulating independently w/ steady gait on d/c in no acute distress, A&Ox4.D/c instructions reviewed w/ pt and family - pt and family deny any further questions or concerns at present.  

## 2014-06-10 NOTE — ED Provider Notes (Signed)
CSN: [id number]     Arrival date & time 06/10/14  1559 History   First MD Initiated Contact with Patient 06/10/14 1846     Chief Complaint  Patient presents with  . Weakness     (Consider location/radiation/quality/duration/timing/severity/associated sxs/prior Treatment) HPI  Erika Strong is a 69 y.o. female with PMH of the medial cancer last chemotherapy in March, hypertension, anemia presenting with generalized weakness for 1 month now. Patient states she feels weak and lightheaded and if she eats she feels better. She denies elevating factors. Patient reports her last 3 weeks of chemotherapy she had a new chemotherapy agent and developed nausea and vomiting subsequently. Patient felt better after eating but reports increased intake. Patient also reports syncopal episode 4 days ago was evaluated in the ED with unremarkable workup. Patient denies headache, visual changes, slurred speech, focal weakness. No chest pain or shortness of breath.   Past Medical History  Diagnosis Date  . Wears glasses   . Hypertension   . Diverticulitis 2010  . Hyperlipidemia     2007  . Cancer   . Endometrial cancer 11/02/2010  . Malignant neoplasm of corpus uteri, except isthmus 01/21/2011  . Anemia due to chemotherapy 01/23/2014  . Hepatic steatosis 02/12/2014   Past Surgical History  Procedure Laterality Date  . Gonadectomy and hysterectomy  11/24/2010    Endometrial cancer, performed by Dr. Janie Morning  . Mole removal  Nov 2012  . Abdominal hysterectomy  11/24/2010    RTLH, BSO, RPLND, LPLNS   Family History  Problem Relation Age of Onset  . Lung cancer Father     smoker  . Cancer Father   . Osteoporosis Mother   . Rheum arthritis Mother   . Cancer Mother    History  Substance Use Topics  . Smoking status: Never Smoker   . Smokeless tobacco: Never Used  . Alcohol Use: No   OB History    Gravida Para Term Preterm AB TAB SAB Ectopic Multiple Living   1 1 1       1      Review of  Systems 10 Systems reviewed and are negative for acute change except as noted in the HPI.    Allergies  Granix; Atorvastatin; Fish allergy; Lisinopril; Pravastatin sodium; and Simvastatin  Home Medications   Prior to Admission medications   Medication Sig Start Date End Date Taking? Authorizing Provider  amLODipine (NORVASC) 2.5 MG tablet Take 1 tablet (2.5 mg total) by mouth daily. 03/25/14  Yes Hali Marry, MD  Ascorbic Acid (VITAMIN C CR) 1000 MG TBCR Take 1,000 mg by mouth daily.   Yes Historical Provider, MD  B Complex-C (B-COMPLEX WITH VITAMIN C) tablet Take 1 tablet by mouth daily.   Yes Historical Provider, MD  LORazepam (ATIVAN) 1 MG tablet 1/2 to 1 tablet every 6 hours as needed for nausea. This will make you drowsy. Can swallow pill or let dissolve under tongue. Night of chemo, whether or not any nausea, take at bedtime Patient taking differently: Take 0.25-1 mg by mouth every 6 (six) hours as needed (nausea). This will make you drowsy. Can swallow pill or let dissolve under tongue. Night of chemo, whether or not any nausea, take at bedtime 05/20/14  Yes Lennis Marion Downer, MD  OVER THE COUNTER MEDICATION Take 1 packet by mouth 2 (two) times daily. [place] Fleeting Vitamin Pak (contains fish oil, potassium and multi vitamins)   Yes Historical Provider, MD  OVER THE COUNTER MEDICATION Take 3  tablets by mouth 3 (three) times daily. Intestinal Sooth and Build   Yes Historical Provider, MD  OVER THE COUNTER MEDICATION Take 2 tablets by mouth 2 (two) times daily. Stress J   Yes Historical Provider, MD  OVER THE COUNTER MEDICATION Take 1 tablet by mouth daily. Liver Care   Yes Historical Provider, MD  OVER THE COUNTER MEDICATION Take 10 mLs by mouth daily with breakfast. Liquid chlorophyl   Yes Historical Provider, MD  Probiotic Product (PROBIOTIC DAILY) CAPS Take 1 capsule by mouth daily.   Yes Historical Provider, MD  pyridOXINE (VITAMIN B-6) 100 MG tablet Take 100 mg by mouth daily.    Yes Historical Provider, MD  Ubiquinol 100 MG CAPS Take 100 mg by mouth daily with breakfast.    Yes Historical Provider, MD  acyclovir ointment (ZOVIRAX) 5 % Apply to fever blister 5 times a day Patient not taking: Reported on 06/06/2014 07/30/13   Gordy Levan, MD  lidocaine-prilocaine (EMLA) cream Apply 1 application topically as needed. Apply to port 1 hour prior to lab draw or chemo 02/28/14   Lennis P Marko Plume, MD  ondansetron (ZOFRAN) 8 MG tablet 1-2 tablets every 12 hours as needed for nausea. Take 1 tablet the AM after chemo whether or not any nausea. This will not make you drowsy. Patient not taking: Reported on 06/06/2014 02/28/14   Gordy Levan, MD  OVER THE COUNTER MEDICATION Take 1 tablet by mouth daily. Curamin    Historical Provider, MD  OVER THE COUNTER MEDICATION Take 3 tablets by mouth daily. [place] Bone Strength (calcium product)    Historical Provider, MD  OVER THE COUNTER MEDICATION Take 30 mLs by mouth daily with breakfast. Flax Seed Oil    Historical Provider, MD  potassium chloride 20 MEQ/15ML (10%) SOLN Take 22.5 mLs (30 mEq total) by mouth 3 (three) times daily. Patient not taking: Reported on 06/10/2014 01/26/14   Barton Dubois, MD  prochlorperazine (COMPAZINE) 10 MG tablet Take 1 tablet (10 mg total) by mouth every 6 (six) hours as needed for nausea or vomiting. Patient not taking: Reported on 06/06/2014 05/27/14   Lennis P Livesay, MD   BP 150/70 mmHg  Pulse 77  Temp(Src) 98.1 F (36.7 C) (Oral)  Resp 21  SpO2 96% Physical Exam  Constitutional: She appears well-developed and well-nourished. No distress.  HENT:  Head: Normocephalic and atraumatic.  Mouth/Throat: Oropharynx is clear and moist.  Eyes: Conjunctivae and EOM are normal. Pupils are equal, round, and reactive to light. Right eye exhibits no discharge. Left eye exhibits no discharge.  Neck: Normal range of motion. Neck supple.  No nuchal rigidity  Cardiovascular: Normal rate and regular rhythm.    Pulmonary/Chest: Effort normal and breath sounds normal. No respiratory distress. She has no wheezes.  Abdominal: Soft. Bowel sounds are normal. She exhibits no distension. There is no tenderness.  Neurological: She is alert. No cranial nerve deficit. Coordination normal.  Speech is clear and goal oriented. Peripheral visual fields intact. Strength 5/5 in upper and lower extremities. Sensation intact. Intact rapid alternating movements, finger to nose, and heel to shin. Negative Romberg. No pronator drift. Normal gait.   Skin: Skin is warm and dry. She is not diaphoretic.  Nursing note and vitals reviewed.   ED Course  Procedures (including critical care time) Labs Review Labs Reviewed  CBC WITH DIFFERENTIAL/PLATELET - Abnormal; Notable for the following:    RDW 16.3 (*)    Monocytes Relative 18 (*)    Monocytes Absolute  1.4 (*)    All other components within normal limits  I-STAT CHEM 8, ED    Imaging Review No results found.   EKG Interpretation   Date/Time:  Monday June 10 2014 20:02:07 EDT Ventricular Rate:  73 PR Interval:  161 QRS Duration: 90 QT Interval:  389 QTC Calculation: 429 R Axis:   -37 Text Interpretation:  Sinus rhythm Abnormal R-wave progression, late  transition Probable left ventricular hypertrophy no significant change  since June 06, 2014 Confirmed by Regenia Skeeter  MD, SCOTT [phone]) on 06/10/2014  8:45:05 PM      MDM   Final diagnoses:  Other fatigue   Patient presenting with persistent fatigue and generalized weakness for 1 month after nausea and vomiting and tapering off Ativan. Patient states she feels better with eating. She denies any nausea vomiting or abdominal pain. Patient states after bolus of fluid her symptoms have resolved. VSS. No focal neurological deficits on exam. EKG without significant change. At work and change from prior. I doubt acute central neurological process. Patient tolerating fluids in the ED without difficulty. Patient  stable for outpatient management with PCP and oncologist as scheduled and discharge. She is nontoxic and nonseptic appearing.  Discussed return precautions with patient. Discussed all results and patient verbalizes understanding and agrees with plan.  This is a shared patient. This patient was discussed with the physician who saw and evaluated the patient and agrees with the plan.     Al Corpus, PA-C 06/10/14 2125  Sherwood Gambler, MD 06/15/14 0830

## 2014-06-10 NOTE — ED Notes (Signed)
Bed: WA06 Expected date:  Expected time:  Means of arrival:  Comments: Ready no monitor

## 2014-06-10 NOTE — Telephone Encounter (Signed)
LEFT PT. A MESSAGE ON HOME VOICE MAIL.

## 2014-06-10 NOTE — ED Notes (Signed)
Pt c/o weakness that started this morning.  Pt states that she was seen here on Friday after she passed out in her doctor's office.  Pt states that she has been tapered off ativan and hasnt had any today. Pt spoke with PCP and was told they dont think it is related to that and if she felt worse to go to ED.  Pt states that she has been taking ativan to help with nausea before she eats.

## 2014-06-12 ENCOUNTER — Telehealth: Payer: Self-pay | Admitting: Gynecologic Oncology

## 2014-06-12 NOTE — Telephone Encounter (Signed)
GYN ONCOLOGY OFFICE VISIT   Chief Complaint:  Recurrent endometrial cancer  Assessment/Plan:  [name] is a 69 y.o. stage IA grade 1 endometrial adenocarcinoma staged 11/2010 now with multifocal recurrence identified 04/2013.  She has received 9 cycles taxol/carboplatin and CT 01/2014 with significant improvement but persistent disease is noted on imaging.    Response to chemotherapy is not complete.  Given the significant number of prior treatments of platin will switch to Doxil. MUGA or ECHO for baseline cardiac assessment PET after three  Cycles doxil. If disease resolves after the next few cycles will consider placing on Megace or aromatase inhibitor given the slow response to chemotherapy Will send tumor for ER/RP   Follow-up with Dr. Marko Strong as scheduled.    HPI: ,[name] is a 69 y.o.  gravida 1, para 1 was in her usual state of health until early June 2012, and she noted light vaginal spotting, this spotting persisted and she sought the advice of Dr. Madilyn Strong, who directly facilitated referral to Dr. Clovia Strong for evaluation. A pelvic ultrasound was obtained and was notable for uterus measuring 6.4 x 2.7 x 4.8 cm. A fibroid was noted in the anterior uterine segment and left fundus. The endometrium was noted to measure 13.4 mm in thickness. Otherwise, the findings were unremarkable. An endometrial biopsy was obtained on October 21, 2010, and demonstrated endometrioid type grade 1 adenocarcinoma focally involving a polyp.   On 11/24/2010 she underwent [place] BSO RPLND LPLNS Pathology endometrial adenocarcinoma with focal superficial myometrial invasion. A invasion was 0.2 cm where the myometrium is 1.4 cm there was no lymph vascular space invasion 0 of 20 lymph nodes were involved with tumor.  Interval History: Patient reported a recent episode of lower abdominal pain c/w diverticulitis in early 2015. She was Rx with a 10 day course of antibiotics with resolution of the abdominal pain.  Imaging was obtained  05/22/2013 which demonstrated In the pelvis, there is a mass in the left anterior upper pelvis measuring 4.1 x 4.1 cm which does not connect to bowel. Uterus is absent. There is a lymph node lateral to the rectum on the right measuring 2.1 x 1.7 cm, consistent with adenopathy. The urinary bladder is midline with normal wall thickness. Uterus is absent  There is wall thickening throughout much of the sigmoid colon with mucosal irregularity consistent with diverticulitis. There is no abscess. There is no bowel obstruction. No free air or portal venous air.  There is no ascites or abscess in the abdomen or pelvis. No other adenopathy is seen beyond that described lateral to the rectum on the right.  Bx mesenteric mass 05/31/2013 Mesentery, ABD mesenteric mass - METASTATIC ADENOCARCINOMA, CONSISTENT WITH ENDOMETRIOID ADENOCARCINOMA.  Erika Strong has received 6 cycles taxol/carboplatin as of 09/2013.   CT 11/15/2013 IMPRESSION:  1. Interval response to therapy. The dominant ventral peritoneal  mass has decreased in size from previous exam.  2. Small right pelvic sidewall nodule is also decreased in size over the interval. The serosal implant along the sigmoid colon is no longer measurable.  She received an additional 3 cycles of taxol/carboplatin  CT 02/08/2014 IMPRESSION: 1. Further reduction in size of the omental mass, indicating response to therapy. 2. Sigmoid colon wall thickening may reflect colitis, but there is a focal region of wall thickening near the rectosigmoid junction which could represent colon cancer or a distal sigmoid colon implant of tumor (see image 69, series 2). 3. Ancillary findings include mild diffuse hepatic steatosis and  a small type 1 hiatal hernia.  CHemotherapy changed to doxil  Cycle 3 doxil administered 04/2014 PET 05/29/2014 IMPRESSION: 1. Enlarging hypermetabolic pelvic lesions as discussed above suggesting progressive disease when compared  to prior CT scan. 2. Probable inflammatory changes in the lungs but no definite metastatic lesions. 3. Single hypermetabolic bone lesion in the right femur. Recommend surveillance.    Review of Systems  Constitutional  Feels well  Cardiovascular  No chest pain, shortness of breath, or edema  Pulmonary  No cough or wheeze.  Gastro Intestinal  No nausea, vomitting, or diarrhoea. No bright red blood per rectum, no abdominal pain Genito Urinary  No frequency, urgency, dysuria, no vaginal bleeding. Musculo Skeletal  No myalgia, arthralgia, joint swelling or pain  Neurologic  No weakness, numbness, change in gait,   Past Medical History  Diagnosis Date  . Wears glasses   . Hypertension   . Diverticulitis 2010  . Hyperlipidemia     2007  . Cancer   . Endometrial cancer 11/02/2010  . Malignant neoplasm of corpus uteri, except isthmus 01/21/2011  . Anemia due to chemotherapy 01/23/2014  . Hepatic steatosis 02/12/2014   Past Surgical History  Procedure Laterality Date  . Gonadectomy and hysterectomy  11/24/2010    Endometrial cancer, performed by Dr. Janie Strong  . Mole removal  Nov 2012  . Abdominal hysterectomy  11/24/2010    RTLH, BSO, RPLND, LPLNS  Colonoscopy 2012 wnl  REVIEW OF SYSTEMS Constitutional  Feels well  Skin/Breast  No rash, sores, jaundice, itching,   Cardiovascular  No chest pain, shortness of breath, or edema  Pulmonary  No cough or wheeze.  Gastro Intestinal  No nausea, vomitting, or diarrhoea. No bright red blood per rectum, no abdominal pain, change in bowel movement, or constipation.  Genito Urinary  No frequency, urgency, dysuria,  Musculo Skeletal  No myalgia, arthralgia, joint swelling or pain  Neurologic  No weakness, numbness, change in gait,  Psychology  No depression, anxiety, insomnia.    PHYSICAL EXAMINATION Vitals BP 133/76 mmHg  Pulse 91  Temp(Src) 98.3 F (36.8 C) (Oral)  Resp 16  Ht 5\' 6"  (1.676 m)  Wt 217 lb 14.4 oz  (98.839 kg)  BMI 35.19 kg/m2 Neck  Supple without any enlargements.  Cardiovascular  Pulse normal rate, regularity and rhythm Lungs  Clear to auscultation bilateraly, Skin  Red blotchy hue of the face and neck. Psychiatry  Alert and oriented to person, place, and time  Abdomen  Normoactive bowel sounds, abdomen soft, non-tender and obese. Surgical  sites intact without evidence of hernia.  Genito Urinary  Vulva/vagina: Normal external female genitalia.  No lesions. No discharge or bleeding.no cul de sac masses Rectal  Good tone, no masses no cul de sac nodularity. External hemorrhoids non tender. Extremities  No bilateral cyanosis, clubbing or edema. No rash, lesions or petiche.     Marland Kitchen

## 2014-06-13 ENCOUNTER — Ambulatory Visit: Payer: Medicare Other | Attending: Gynecologic Oncology | Admitting: Gynecologic Oncology

## 2014-06-13 ENCOUNTER — Telehealth: Payer: Self-pay | Admitting: *Deleted

## 2014-06-13 ENCOUNTER — Encounter: Payer: Self-pay | Admitting: Gynecologic Oncology

## 2014-06-13 VITALS — BP 155/72 | HR 92 | Temp 98.3°F | Resp 18 | Ht 66.0 in | Wt 207.7 lb

## 2014-06-13 DIAGNOSIS — C786 Secondary malignant neoplasm of retroperitoneum and peritoneum: Secondary | ICD-10-CM | POA: Insufficient documentation

## 2014-06-13 DIAGNOSIS — E785 Hyperlipidemia, unspecified: Secondary | ICD-10-CM | POA: Diagnosis not present

## 2014-06-13 DIAGNOSIS — I10 Essential (primary) hypertension: Secondary | ICD-10-CM | POA: Insufficient documentation

## 2014-06-13 DIAGNOSIS — C541 Malignant neoplasm of endometrium: Secondary | ICD-10-CM

## 2014-06-13 DIAGNOSIS — K760 Fatty (change of) liver, not elsewhere classified: Secondary | ICD-10-CM | POA: Diagnosis not present

## 2014-06-13 DIAGNOSIS — Z90710 Acquired absence of both cervix and uterus: Secondary | ICD-10-CM | POA: Diagnosis not present

## 2014-06-13 MED ORDER — MEGESTROL ACETATE 40 MG PO TABS: 40.0000 mg | ORAL_TABLET | Freq: Four times a day (QID) | ORAL | Status: DC

## 2014-06-13 NOTE — Patient Instructions (Addendum)
Megestrol tablets What is this medicine? MEGESTROL (me JES trol) belongs to a class of drugs known as progestins. Megestrol tablets are used to treat advanced breast or endometrial cancer. This medicine may be used for other purposes; ask your health care provider or pharmacist if you have questions. COMMON BRAND NAME(S): Megace What should I tell my health care provider before I take this medicine? They need to know if you have any of these conditions: -adrenal gland problems -history of blood clots of the legs, lungs, or other parts of the body -diabetes -kidney disease -liver disease -stroke -an unusual or allergic reaction to megestrol, other medicines, foods, dyes, or preservatives -pregnant or trying to get pregnant -breast-feeding How should I use this medicine? Take this medicine by mouth. Follow the directions on the prescription label. Do not take your medicine more often than directed. Take your doses at regular intervals. Do not stop taking except on the advice of your doctor or health care professional. Talk to your pediatrician regarding the use of this medicine in children. Special care may be needed. Overdosage: If you think you have taken too much of this medicine contact a poison control center or emergency room at once. NOTE: This medicine is only for you. Do not share this medicine with others. What if I miss a dose? If you miss a dose, take it as soon as you can. If it is almost time for your next dose, take only that dose. Do not take double or extra doses. What may interact with this medicine? Do not take this medicine with any of the following medications: -dofetilide This medicine may also interact with the following medications: -carbamazepine -indinavir -phenobarbital -phenytoin -primidone -rifampin -warfarin This list may not describe all possible interactions. Give your health care provider a list of all the medicines, herbs, non-prescription drugs, or  dietary supplements you use. Also tell them if you smoke, drink alcohol, or use illegal drugs. Some items may interact with your medicine. What should I watch for while using this medicine? Visit your doctor or health care professional for regular checks on your progress. Continue taking this medicine even if you feel better. It may take 2 months of regular use before you know if this medicine is working for your condition. If you are a female of child-bearing age, use an effective method of birth control while you are taking this medicine. This medicine should not be used by females who are pregnant or breast-feeding. There is a potential for serious side effects to an unborn child or to an infant. Talk to your health care professional or pharmacist for more information. If you have diabetes, this medicine may affect blood sugar levels. Check your blood sugar and talk to your doctor or health care professional if you notice changes. What side effects may I notice from receiving this medicine? Side effects that you should report to your doctor or health care professional as soon as possible: -difficulty breathing or shortness of breath -chest pain -dizziness -fluid retention -increased blood pressure -leg pain or swelling -nausea and vomiting -skin rash or itching -weakness Side effects that usually do not require medical attention (report to your doctor or health care professional if they continue or are bothersome): -breakthrough menstrual bleeding -hot flashes or flushing -increased appetite -mood changes -sweating -weight gain This list may not describe all possible side effects. Call your doctor for medical advice about side effects. You may report side effects to FDA at 1-800-FDA-1088. Where should I keep   my medicine? Keep out of the reach of children. Store at controlled room temperature between 15 and 30 degrees C (59 and 86 degrees F). Protect from heat above 40 degrees C (104  degrees F). Throw away any unused medicine after the expiration date. NOTE: This sheet is a summary. It may not cover all possible information. If you have questions about this medicine, talk to your doctor, pharmacist, or health care provider.  2015, [employer]. (2007-08-28 15:57:10)  Follow-up in 3 months PET  in 3 months

## 2014-06-13 NOTE — Telephone Encounter (Signed)
Received prior authorization for megestrol 40mg  tablets. Approval authorization #: [id number] - valid through 06/13/15. Called patient's pharmacy and they will go ahead and fill prescription. Patient notified that this has been done and is agreeable to pickup medication later today.

## 2014-06-13 NOTE — Progress Notes (Signed)
GYN ONCOLOGY OFFICE VISIT   Chief Complaint:  Recurrent endometrial cancer  Assessment/Plan:  Erika Strong is a 69 y.o. stage IA grade 1 endometrial adenocarcinoma staged 11/2010 now with multifocal recurrence identified 04/2013.  She has received 9 cycles taxol/carboplatin and CT 01/2014 with significant improvement but persistent disease is noted on imaging.  Chemotherapy changed to doxil with disease progression and symptomatology.  Options presented were single agent taxol versus Megace.  Patient opts for Megace Rx Megace 40 mg QID Repeat PET in 3 months  Counseled regarding the signs and symptoms of bowel obstruction. Counseled that disease progression will occur at sometime.  Follow-up with Dr. Marko Plume as scheduled. Discussion lasted 30 minutes.   HPI: ,Erika Strong is a 69 y.o.  gravida 1, para 1 was in her usual state of health until early June 2012, and she noted light vaginal spotting, this spotting persisted and she sought the advice of Dr. Madilyn Fireman, who directly facilitated referral to Dr. Clovia Cuff for evaluation. A pelvic ultrasound was obtained and was notable for uterus measuring 6.4 x 2.7 x 4.8 cm. A fibroid was noted in the anterior uterine segment and left fundus. The endometrium was noted to measure 13.4 mm in thickness. Otherwise, the findings were unremarkable. An endometrial biopsy was obtained on October 21, 2010, and demonstrated endometrioid type grade 1 adenocarcinoma focally involving a polyp.   On 11/24/2010 she underwent [place] BSO RPLND LPLNS Pathology endometrial adenocarcinoma with focal superficial myometrial invasion. A invasion was 0.2 cm where the myometrium is 1.4 cm there was no lymph vascular space invasion 0 of 20 lymph nodes were involved with tumor.  Interval History: Patient reported a recent episode of lower abdominal pain c/w diverticulitis in early 2015. She was Rx with a 10 day course of antibiotics with resolution of the abdominal pain. Imaging was  obtained  05/22/2013 which demonstrated In the pelvis, there is a mass in the left anterior upper pelvis measuring 4.1 x 4.1 cm which does not connect to bowel. Uterus is absent. There is a lymph node lateral to the rectum on the right measuring 2.1 x 1.7 cm, consistent with adenopathy. The urinary bladder is midline with normal wall thickness. Uterus is absent  There is wall thickening throughout much of the sigmoid colon with mucosal irregularity consistent with diverticulitis. There is no abscess. There is no bowel obstruction. No free air or portal venous air.  There is no ascites or abscess in the abdomen or pelvis. No other adenopathy is seen beyond that described lateral to the rectum on the right.  Bx mesenteric mass 05/31/2013 Mesentery, ABD mesenteric mass - METASTATIC ADENOCARCINOMA, CONSISTENT WITH ENDOMETRIOID ADENOCARCINOMA.  Erika Strong has received 6 cycles taxol/carboplatin as of 09/2013.   CT 11/15/2013 IMPRESSION:  1. Interval response to therapy. The dominant ventral peritoneal  mass has decreased in size from previous exam.  2. Small right pelvic sidewall nodule is also decreased in size over the interval. The serosal implant along the sigmoid colon is no longer measurable.  She received an additional 3 cycles of taxol/carboplatin  CT 02/08/2014 IMPRESSION: 1. Further reduction in size of the omental mass, indicating response to therapy. 2. Sigmoid colon wall thickening may reflect colitis, but there is a focal region of wall thickening near the rectosigmoid junction which could represent colon cancer or a distal sigmoid colon implant of tumor (see image 69, series 2). 3. Ancillary findings include mild diffuse hepatic steatosis and a small type 1 hiatal hernia.  Erika Seller  Strong was unable to tolerate neulasta and carboplatin use was associated with neutropenia and treatment delays and the carbo/taxol regimen could not be continued.  Chemotherapy changed to doxil  Cycle 3  doxil administered 04/2014 PET 05/29/2014 IMPRESSION: 1. Enlarging hypermetabolic pelvic lesions as discussed above suggesting progressive disease when compared to prior CT scan. 2. Probable inflammatory changes in the lungs but no definite metastatic lesions. 3. Single hypermetabolic bone lesion in the right femur. Recommend surveillance.    Reports abdominal discomfort after eating, poor appetite, decreased energy.  ER+PR+  Past Medical History  Diagnosis Date  . Wears glasses   . Hypertension   . Diverticulitis 2010  . Hyperlipidemia     2007  . Cancer   . Endometrial cancer 11/02/2010  . Malignant neoplasm of corpus uteri, except isthmus 01/21/2011  . Anemia due to chemotherapy 01/23/2014  . Hepatic steatosis 02/12/2014   Past Surgical History  Procedure Laterality Date  . Gonadectomy and hysterectomy  11/24/2010    Endometrial cancer, performed by Dr. Janie Morning  . Mole removal  Nov 2012  . Abdominal hysterectomy  11/24/2010    RTLH, BSO, RPLND, LPLNS  Colonoscopy 2012 wnl  REVIEW OF SYSTEMS Constitutional  Feels fatigue Skin/Breast  No rash, sores, jaundice, itching,   Cardiovascular  No chest pain, shortness of breath, or edema  Pulmonary  No cough or wheeze.  Gastro Intestinal  No nausea, vomitting, or diarrhoea. No bright red blood per rectum, no abdominal pain, abdominal discomfort occasionally after eating Genito Urinary  No frequency, urgency, dysuria,  Musculo Skeletal  No myalgia, arthralgia, joint swelling or pain  Neurologic  No weakness, numbness, change in gait,  Psychology  No depression, anxiety, insomnia.   Marland Kitchen

## 2014-06-17 ENCOUNTER — Ambulatory Visit (HOSPITAL_COMMUNITY): Admission: RE | Admit: 2014-06-17 | Payer: Medicare Other | Source: Ambulatory Visit

## 2014-06-21 ENCOUNTER — Other Ambulatory Visit: Payer: Self-pay

## 2014-06-21 DIAGNOSIS — C541 Malignant neoplasm of endometrium: Secondary | ICD-10-CM

## 2014-06-23 ENCOUNTER — Other Ambulatory Visit: Payer: Self-pay | Admitting: Oncology

## 2014-06-24 ENCOUNTER — Ambulatory Visit (HOSPITAL_COMMUNITY)
Admission: RE | Admit: 2014-06-24 | Discharge: 2014-06-24 | Disposition: A | Discharge status: Home / Self Care | Payer: Medicare Other | Source: Ambulatory Visit | Attending: Oncology | Admitting: Oncology

## 2014-06-24 ENCOUNTER — Other Ambulatory Visit: Payer: Self-pay | Admitting: Oncology

## 2014-06-24 ENCOUNTER — Other Ambulatory Visit: Payer: Medicare Other

## 2014-06-24 ENCOUNTER — Encounter (HOSPITAL_COMMUNITY): Payer: Self-pay

## 2014-06-24 ENCOUNTER — Ambulatory Visit: Payer: Medicare Other | Admitting: Oncology

## 2014-06-24 DIAGNOSIS — R531 Weakness: Secondary | ICD-10-CM | POA: Diagnosis not present

## 2014-06-24 DIAGNOSIS — C541 Malignant neoplasm of endometrium: Secondary | ICD-10-CM

## 2014-06-24 MED ORDER — IOHEXOL 300 MG/ML  SOLN
100.0000 mL | Freq: Once | INTRAMUSCULAR | Status: AC | PRN
  Administered 2014-06-24: 100 mL via INTRAVENOUS

## 2014-06-25 ENCOUNTER — Telehealth: Payer: Self-pay | Admitting: Oncology

## 2014-06-25 NOTE — Telephone Encounter (Signed)
Returned Advertising account executive. Left message to confirm reschedule missed appointment to 05/23. Mailed calendar.

## 2014-06-28 ENCOUNTER — Telehealth: Payer: Self-pay | Admitting: Oncology

## 2014-06-28 ENCOUNTER — Telehealth: Payer: Self-pay | Admitting: *Deleted

## 2014-06-28 NOTE — Telephone Encounter (Signed)
Gave patient results of CT per Dr Marko Plume note. Pt has appt with Dr Marko Plume on 5/23 with lab, will add flush. Pt states she is doing better- no weakness or dizziness. Doing well on megace.

## 2014-06-28 NOTE — Telephone Encounter (Signed)
-----   Message from Baruch Merl, RN sent at 06/27/2014  8:06 PM EDT -----   ----- Message -----    From: Gordy Levan, MD    Sent: 06/24/2014   5:19 PM      To: Baruch Merl, RN, Christa See, RN  Labs seen and need follow up: please let her know CT head did not show anything of concern in brain or any evidence of cancer or stroke.  She probably did not know about apt at [facility] today:  If PAC was used for the CT, she could get it flushed again with Dr Leone Brand visit late June; if not used for CT it will need flush at least 8 weeks from last. I am glad to see her if needed, or if she is doing ok on the megace and prefers just apt as scheduled with Dr Skeet Latch in late June, that is ok too.  If I don't see her prior to Dr Skeet Latch, please set up to see me with PAC flush + lab 6-8 wks after Dr Leone Brand apt.

## 2014-06-28 NOTE — Telephone Encounter (Signed)
Lft msg for pt confirming flush added to schedule with labs/ov per 05/06 POF... KJ mailed out schedule

## 2014-06-28 NOTE — Telephone Encounter (Signed)
Left message for patient to call Graycee Greeson with Dr Marko Plume.

## 2014-07-01 ENCOUNTER — Telehealth: Payer: Self-pay | Admitting: *Deleted

## 2014-07-01 DIAGNOSIS — C7989 Secondary malignant neoplasm of other specified sites: Secondary | ICD-10-CM

## 2014-07-01 NOTE — Telephone Encounter (Signed)
Pt called and states that she would like a referral to [facility] gyn/oncology Dr. Theora Gianotti or Dr. Erasmo Leventhal [phone]. She has not been pleased with her care with [facility] since March I asked if she has spoken with her specialist she has not at this point however, she does have an upcoming appointment and said she will discuss this at that time.Audelia Hives [place]

## 2014-07-05 ENCOUNTER — Ambulatory Visit (INDEPENDENT_AMBULATORY_CARE_PROVIDER_SITE_OTHER): Payer: Medicare Other | Admitting: Family Medicine

## 2014-07-05 ENCOUNTER — Other Ambulatory Visit: Payer: Self-pay | Admitting: Family Medicine

## 2014-07-05 ENCOUNTER — Encounter: Payer: Self-pay | Admitting: Family Medicine

## 2014-07-05 VITALS — BP 160/90 | HR 86 | Wt 207.0 lb

## 2014-07-05 DIAGNOSIS — R300 Dysuria: Secondary | ICD-10-CM

## 2014-07-05 DIAGNOSIS — C7989 Secondary malignant neoplasm of other specified sites: Secondary | ICD-10-CM | POA: Diagnosis not present

## 2014-07-05 DIAGNOSIS — F430 Acute stress reaction: Secondary | ICD-10-CM

## 2014-07-05 DIAGNOSIS — G4700 Insomnia, unspecified: Secondary | ICD-10-CM

## 2014-07-05 DIAGNOSIS — Z139 Encounter for screening, unspecified: Secondary | ICD-10-CM

## 2014-07-05 MED ORDER — FLUOXETINE HCL 10 MG PO CAPS: ORAL_CAPSULE | ORAL | Status: DC

## 2014-07-05 MED ORDER — TRAZODONE HCL 50 MG PO TABS: 25.0000 mg | ORAL_TABLET | Freq: Every evening | ORAL | Status: DC | PRN

## 2014-07-05 MED ORDER — SULFAMETHOXAZOLE-TRIMETHOPRIM 800-160 MG PO TABS: 1.0000 | ORAL_TABLET | Freq: Two times a day (BID) | ORAL | Status: DC

## 2014-07-05 NOTE — Patient Instructions (Signed)
Sent over a new prescription today for trazodone. Can start with half a tab and can increase that to 2 tabs if needed for sleep. Also sent over prescription for fluoxetine. This is to help with your nerves. Just remember it takes a couple of weeks to kick in. I also sent over prescription for an antibiotic for urinary tract infection. Please call us next week if you're still having some symptoms.

## 2014-07-05 NOTE — Progress Notes (Signed)
   Subjective:    Patient ID: Erika Strong, female    DOB: 01/27/1946, 69 y.o.   MRN: [id number]  HPI She completed her chemo about 8 weeks ago. On 3/14.  Then got sick 2 days later. She called there office and they recommended something including Ativan. Then felt like she was getting hooked on them so stopped them.  She continues to just not felt well. She wasn't sure if it was a virus or if it was the medication or the chemotherapy. The cat she had tolerated chemotherapy great up until that point.  She went for PET scan on April. Told the cancer had enlarged since her last CT scan.  Then saw Dr. Skeet Latch and now put on hormone tx, Megace. She's taking 40 mg 4 times a day. She is facing at about 4 hours apart from each other..  She is not sleeping well too.  She is very tearful. She eventually called earlier this week and requests a second opinion from [place]. She feels like since the cancer has progressed she runs to at least have the reassurance that she's had a second opinion for options.  Dysuria x 2 days.  No hematuria.  No fever, chills or back pain.    Review of Systems     Objective:   Physical Exam  Constitutional: She is oriented to person, place, and time. She appears well-developed and well-nourished.  HENT:  Head: Normocephalic and atraumatic.  Neck: Neck supple. No thyromegaly present.  Cardiovascular: Normal rate, regular rhythm and normal heart sounds.   Pulmonary/Chest: Effort normal and breath sounds normal.  Lymphadenopathy:    She has no cervical adenopathy.  Neurological: She is alert and oriented to person, place, and time.  Skin: Skin is warm and dry.  Psychiatric: She has a normal mood and affect. Her behavior is normal.          Assessment & Plan:  Metastatic endometrioid adenocarcinoma to pelvis diagnosed April 2015-referral placed 4 days ago for a consult with [facility]. She's not heard back from them yet. She has not gotten a call back by Tuesday of  next week I encouraged her to call the office back and we can call and check on the referral process. She is overall feeling somewhat better. Still not clear what made her feel poorly for the last month or so that she is gradually feeling better.  Dysuria-urinalysis positive for some small leukocytes. Benadryl ahead and treat her with 3 days of Bactrim. Call if not better next week.   Anxiety and depression, clearly situational-she really would like something for her nerves and so the Ativan. She doesn't want something that is habit-forming. We discussed putting her on SSRI doing that it may take a couple weeks to start to be effective. Discussed potential side effects. Follow up in one month to see how she's doing and to adjust her dose as needed. Next  Insomnia-most likely secondary to recent change in mood and increase in anxiety. We discussed using a low dose of trazodone as needed. It's not habit-forming that can cause some sedation in the morning are discussed this so that she could be aware of potential side effects.

## 2014-07-15 ENCOUNTER — Telehealth: Payer: Self-pay | Admitting: Oncology

## 2014-07-15 ENCOUNTER — Ambulatory Visit: Payer: Medicare Other

## 2014-07-15 ENCOUNTER — Encounter: Payer: Self-pay | Admitting: Oncology

## 2014-07-15 ENCOUNTER — Ambulatory Visit (HOSPITAL_BASED_OUTPATIENT_CLINIC_OR_DEPARTMENT_OTHER): Payer: Medicare Other | Admitting: Oncology

## 2014-07-15 ENCOUNTER — Other Ambulatory Visit (HOSPITAL_BASED_OUTPATIENT_CLINIC_OR_DEPARTMENT_OTHER): Payer: Medicare Other

## 2014-07-15 ENCOUNTER — Telehealth: Payer: Self-pay | Admitting: *Deleted

## 2014-07-15 VITALS — BP 168/72 | HR 67 | Temp 97.8°F | Resp 18 | Ht 66.0 in | Wt 210.3 lb

## 2014-07-15 DIAGNOSIS — C541 Malignant neoplasm of endometrium: Secondary | ICD-10-CM

## 2014-07-15 DIAGNOSIS — G4700 Insomnia, unspecified: Secondary | ICD-10-CM

## 2014-07-15 DIAGNOSIS — I10 Essential (primary) hypertension: Secondary | ICD-10-CM

## 2014-07-15 DIAGNOSIS — F4321 Adjustment disorder with depressed mood: Secondary | ICD-10-CM

## 2014-07-15 DIAGNOSIS — Z95828 Presence of other vascular implants and grafts: Secondary | ICD-10-CM

## 2014-07-15 DIAGNOSIS — C7989 Secondary malignant neoplasm of other specified sites: Secondary | ICD-10-CM

## 2014-07-15 LAB — COMPREHENSIVE METABOLIC PANEL (CC13)
ALBUMIN: 3.7 g/dL (ref 3.5–5.0)
ALK PHOS: 105 U/L (ref 40–150)
ALT: 21 U/L (ref 0–55)
ANION GAP: 11 meq/L (ref 3–11)
AST: 26 U/L (ref 5–34)
BUN: 11.9 mg/dL (ref 7.0–26.0)
CO2: 20 mEq/L — ABNORMAL LOW (ref 22–29)
Calcium: 9.2 mg/dL (ref 8.4–10.4)
Chloride: 106 mEq/L (ref 98–109)
Creatinine: 0.8 mg/dL (ref 0.6–1.1)
EGFR: 81 mL/min/{1.73_m2} — ABNORMAL LOW (ref >90)
GLUCOSE: 89 mg/dL (ref 70–140)
POTASSIUM: 3.9 meq/L (ref 3.5–5.1)
Sodium: 137 mEq/L (ref 136–145)
Total Bilirubin: 0.45 mg/dL (ref 0.20–1.20)
Total Protein: 7.1 g/dL (ref 6.4–8.3)

## 2014-07-15 LAB — CBC WITH DIFFERENTIAL/PLATELET
BASO%: 0.3 % (ref 0.0–2.0)
Basophils Absolute: 0 10*3/uL (ref 0.0–0.1)
EOS%: 1.6 % (ref 0.0–7.0)
Eosinophils Absolute: 0.1 10*3/uL (ref 0.0–0.5)
HCT: 39.7 % (ref 34.8–46.6)
HEMOGLOBIN: 12.9 g/dL (ref 11.6–15.9)
LYMPH#: 2 10*3/uL (ref 0.9–3.3)
LYMPH%: 22.9 % (ref 14.0–49.7)
MCH: 29.7 pg (ref 25.1–34.0)
MCHC: 32.5 g/dL (ref 31.5–36.0)
MCV: 91.3 fL (ref 79.5–101.0)
MONO#: 0.8 10*3/uL (ref 0.1–0.9)
MONO%: 9.6 % (ref 0.0–14.0)
NEUT%: 65.6 % (ref 38.4–76.8)
NEUTROS ABS: 5.7 10*3/uL (ref 1.5–6.5)
Platelets: 190 10*3/uL (ref 145–400)
RBC: 4.35 10*6/uL (ref 3.70–5.45)
RDW: 17.6 % — ABNORMAL HIGH (ref 11.2–14.5)
WBC: 8.6 10*3/uL (ref 3.9–10.3)

## 2014-07-15 MED ORDER — HEPARIN SOD (PORK) LOCK FLUSH 100 UNIT/ML IV SOLN
500.0000 [IU] | Freq: Once | INTRAVENOUS | Status: AC
  Administered 2014-07-15: 500 [IU] via INTRAVENOUS
  Filled 2014-07-15: qty 5

## 2014-07-15 MED ORDER — SODIUM CHLORIDE 0.9 % IJ SOLN
10.0000 mL | INTRAMUSCULAR | Status: DC | PRN
  Administered 2014-07-15: 10 mL via INTRAVENOUS
  Filled 2014-07-15: qty 10

## 2014-07-15 NOTE — Telephone Encounter (Signed)
-----   Message from Gordy Levan, MD sent at 07/15/2014  1:45 PM EDT ----- Please let her know that the repeat blood work all looks good, including K fine at 3.9, WBC and plt good and hemoglobin good at 12.9

## 2014-07-15 NOTE — Telephone Encounter (Signed)
Notified patient of results as noted below by Dr. Marko Plume. Patient appreciative of call.

## 2014-07-15 NOTE — Progress Notes (Signed)
OFFICE PROGRESS NOTE   Jul 15, 2014   Physicians:[place], Abigail Butts; [place], Barnetta Chapel; Darlis Loan (GI [facility]all Allegra)  INTERVAL HISTORY:  Patient is seen, together with husband, in continuing attention to recurrent endometrial cancer, which was persistent after 9 cycles of carbo taxol thru 01-21-14, and then progressed after 3 cycles of doxil thru 05-06-14. She had PET on 05-29-14 prior to seeing Dr Skeet Latch on 06-13-14. She began Megace 40 mg qid from Dr Leone Brand visit. She has second opinion consultation scheduled with Dr Mellody Drown at [facility] on 07-25-14, set up at recent visit to PCP. She is to see Dr Skeet Latch again in late June.  Patient has felt much better since resolution of GI symptoms a few weeks ago, after another ED visit with IVF on 06-10-14. She saw PCP Dr Madilyn Fireman after ED, begun on trazodone and prozac. Energy is now improved to the point that she is again walking and doing some yard work. Breathing is good, no pain, no LE swelling, no bleeding, bowels moving well daily, and appetite good but not ravenous on the Megace.    PAC in place. Did not draw blood well today. Flu vaccine done  ONCOLOGIC HISTORY Patient presented with vaginal spotting 07-2010. Endometrial biopsy by Dr Clovia Cuff 10-21-10 revealed endometroid carcinoma grade 1 focally involving polyp. She had robotic assisted total laparoscopic hysterectomy BSO and 20 pelvic node evaluation by Dr Skeet Latch on 11-24-2010. Pathology [phone]) had endometrial adenocarcinoma IA FIGO grade I with focal squamous differentiation, superficial myometrial invasion ( 0.2 cm where myometrium 1.4 cm); benign cervix, tubes and ovaries; no LVSI. With no high risk features, she was followed with observation only, including exams by gyn oncology every 6 months.  In early March 2015 she had acute onset of lower abdominal pain, then fever and nausea over next several days. She was treated by PCP for presumed diverticulitis with cipro and flagyl,  initially with improvement then symptoms recurrent in late March. She had CT AP 05-23-13 showed a 4.1x4.1 cm mass left anterior upper pelvis, and CT biopsy 05-31-13 of mesenteric mass at level of umbilicus showed metastatic adenocarcinoma consistent with endometrioid carcinoma. Patient saw Dr Skeet Latch at [facility] on 06-08-13, with exam remarkable for palpable right pelvic mass and recommendation for 3 cycles of taxane / platin chemotherapy, then repeat imaging; may also consider radiation after chemotherapy. The initial abdominal pain resolved entirely. Cycle 1 taxol carboplatin was given 06-26-2013; she was neutropenic with ANC 0 on day 14. Repeat CT AP after cycle 3 had partial response of dominant ventral peritoneal mass, right pelvic sidewall mass and implant along sigmoid colon. She completed additional 3 cycles of carboplatin and taxol for total 6 cycles on 10-18-13, with neulasta support. Restaging CT showed partial response but still residual involvement including intraperitoneal disease, with additional 3 cycles carbo taxol recommended. She had cycle 1 day 1 dose dense carbo taxol on 11-26-13 and completed 3 cycles on 01-21-14. CT 02-08-14 after this total 9 cycles carboplatin taxol had significant improvement but still persistent disease. She began doxil on 03-11-14, cycle 3 on 05-06-14, tolerated well. Echocardiogram 02-20-14 had EF 55-60%. PET 06-06-14 had increase in size of metabolically active omental mass as well as right pelvic sidewall mass and right obturator region mass; PET also showed uptake in  subtrochanteric region of right femur not clearly metastatic. She began Megace on 06-13-14.     Review of systems as above, also: No fever or symptoms of infection. No pain right hip or RLE. She is able  to sleep and not as depressed on trazodone and prozac by Dr Madilyn Fireman, begun 07-05-14. Remainder of 10 point Review of Systems negative.  Objective:  Vital signs in last 24 hours:  BP 168/72 mmHg  Pulse 67   Temp(Src) 97.8 F (36.6 C) (Oral)  Resp 18  Ht 5\' 6"  (1.676 m)  Wt 210 lb 4.8 oz (95.391 kg)  BMI 33.96 kg/m2  SpO2 100%  Alert, oriented and appropriate. Looks more comfortable today, tearful once during visit. Ambulatory without difficulty.   HEENT:PERRL, sclerae not icteric. Oral mucosa moist without lesions, posterior pharynx clear.  Neck supple. No JVD.  Lymphatics:no cervical,supraclavicular adenopathy Resp: clear to auscultation bilaterally  Cardio: regular rate and rhythm. No gallop. GI: soft, nontender, not distended, no mass or organomegaly. Normally active bowel sounds. Musculoskeletal/ Extremities: without pitting edema, cords, tenderness Neuro: speech fluent, no focal deficity. Psych appropriate mood and affect for situation Skin without rash, ecchymosis, petechiae  Portacath-without erythema or tenderness  Lab Results:  Results for orders placed or performed during the hospital encounter of 06/10/14  CBC with Differential  Result Value Ref Range   WBC 7.5 4.0 - 10.5 K/uL   RBC 4.31 3.87 - 5.11 MIL/uL   Hemoglobin 12.7 12.0 - 15.0 g/dL   HCT 40.6 36.0 - 46.0 %   MCV 94.2 78.0 - 100.0 fL   MCH 29.5 26.0 - 34.0 pg   MCHC 31.3 30.0 - 36.0 g/dL   RDW 16.3 (H) 11.5 - 15.5 %   Platelets 190 150 - 400 K/uL   Neutrophils Relative % 56 43 - 77 %   Neutro Abs 4.2 1.7 - 7.7 K/uL   Lymphocytes Relative 24 12 - 46 %   Lymphs Abs 1.8 0.7 - 4.0 K/uL   Monocytes Relative 18 (H) 3 - 12 %   Monocytes Absolute 1.4 (H) 0.1 - 1.0 K/uL   Eosinophils Relative 2 0 - 5 %   Eosinophils Absolute 0.1 0.0 - 0.7 K/uL   Basophils Relative 0 0 - 1 %   Basophils Absolute 0.0 0.0 - 0.1 K/uL  I-Stat Chem 8, ED  Result Value Ref Range   Sodium 139 135 - 145 mmol/L   Potassium 3.8 3.5 - 5.1 mmol/L   Chloride 101 96 - 112 mmol/L   BUN 8 6 - 23 mg/dL   Creatinine, Ser 0.70 0.50 - 1.10 mg/dL   Glucose, Bld 96 70 - 99 mg/dL   Calcium, Ion 1.27 1.13 - 1.30 mmol/L   TCO2 25 0 - 100 mmol/L    Hemoglobin 13.6 12.0 - 15.0 g/dL   HCT 40.0 36.0 - 46.0 %     Studies/Results:  EXAM: NUCLEAR MEDICINE PET SKULL BASE TO THIGH  05-29-14  TECHNIQUE: 10.5 mCi F-18 FDG was injected intravenously. Full-ring PET imaging was performed from the skull base to thigh after the radiotracer. CT data was obtained and used for attenuation correction and anatomic localization.  FASTING BLOOD GLUCOSE: Value: 96 mg/dl  COMPARISON: CT scans 11/15/2013 and 02/08/2014  FINDINGS: NECK  No hypermetabolic lymph nodes in the neck.  CHEST  No hypermetabolic mediastinal or hilar nodes. No suspicious pulmonary nodules on the CT scan. There is patchy hypermetabolism noted in the lower lung zones bilaterally which is probably inflammation and/or atelectasis I do not see any focal airspace consolidation on the CT scan or worrisome pulmonary lesions. No pleural effusion.  ABDOMEN/PELVIS  Interval enlargement of the omental mass located just below the level of the iliac crest anteriorly. On  the prior CT scan it measured 24 x 17.5 mm and now measures 31 x 24 mm. It is metabolically active with SUV max of 9.25.  There is a right pelvic sidewall soft tissue mass which is also enlarged since the prior CT scan. It measures approximately 15.5 x 13.5 mm and SUV max is 8.2.  Soft tissue mass in the obturator region on the right side has enlarged since the prior CT scan. It measures approximately 20 x 14 mm and is hypermetabolic with SUV max of 8.5.  Small, weakly positive inguinal lymph nodes are noted bilaterally. These are likely inflammatory and not metastatic disease.  SKELETON  There is a single hypermetabolic lesion in the subtrochanteric region of the right femur. SUV max is 3.25. There appears to be slight increased marrow attenuation on the CT scan. I believe this was present on prior CT scans. It may be a benign process such is fibrous dysplasia as I do not see any other  definite bone lesions. Surveillance is recommended.  IMPRESSION: 1. Enlarging hypermetabolic pelvic lesions as discussed above suggesting progressive disease when compared to prior CT scan. 2. Probable inflammatory changes in the lungs but no definite metastatic lesions. 3. Single hypermetabolic bone lesion in the right femur. Recommend surveillance.    EXAM: CT HEAD WITHOUT AND WITH CONTRAST  06-24-14  TECHNIQUE: Contiguous axial images were obtained from the base of the skull through the vertex without and with intravenous contrast  CONTRAST: 141mL OMNIPAQUE IOHEXOL 300 MG/ML SOLN  COMPARISON: None.  FINDINGS: No acute cortical infarct, hemorrhage, or mass lesion is present. The ventricles are of normal size. No significant extra-axial fluid collection is evident. The paranasal sinuses and mastoid air cells are clear. The calvarium is intact. Mild hyperostosis frontalis internus is evident.  The postcontrast images demonstrate no pathologic enhancement. There does appear to be some narrowing of the right vertebral artery just proximal to the vertebrobasilar junction. The vessels are otherwise unremarkable. The globes and orbits are intact.  IMPRESSION: 1. Normal CT appearance of the brain before and after contrast. 2. Questions stenosis of the right vertebral artery just proximal to the vertebrobasilar junction.   Medications: I have reviewed the patient's current medications. She is appreciative of Dr Gardiner Ramus treatment with trazodone and prozac  DISCUSSION: I have spoken directly with new patient scheduling at [facility] re appointment for second opinion next week: we will fax records, as they prefer this to [facility], and patient will take discs of PET and last CT AP to that visit. She understands that the Megace is used in attempt to shrink and control the metastatic endometrial cancer; she is aware that appetite sometimes is too good with Megace, but at  present she is just doing well with this. I have told her that we are glad for her to have second opinion visit and are also very open to other recommendations from Dr Fransisca Connors.   Assessment/Plan: 1.Endometrial carcinoma: IA FIGO grade 1 at diagnosis 11-2010, recurrent March 2015 and progressive despite carbo taxol and doxil since then. Not obviously symptomatic. Now on Megace. For second opinion consultation as above.  2.GI symptoms and near syncope 05-2014, resolved. May have been gastrointestinal virus + dehydration 3.PAC in, does not draw well. Not otherwise used now, but still may be useful later. Needs to be flushed every 6-8 weeks if not otherwise used. 4.hx HTN 5.hx diverticulosis 6.situational depression and sleep difficulty: better on prozac and trazodone by PCP Dr Madilyn Fireman since 07-05-14   All questions  answered to best of my ability. I will see her around time of next PAC flush, but can move that if needed. Time spent 25 min including >50% counseling and coordination of care.   Erika Strong P, MD   07/15/2014, 9:20 AM

## 2014-07-15 NOTE — Patient Instructions (Signed)

## 2014-07-15 NOTE — Telephone Encounter (Signed)
Appointments made and avs printed for patient °

## 2014-07-16 ENCOUNTER — Telehealth: Payer: Self-pay

## 2014-07-16 NOTE — Telephone Encounter (Signed)
Faxed records as requested by Dr. Marko Plume  to Dr. Mellody Drown for appointment 07-25-14. Faxed 56 pages.

## 2014-07-24 ENCOUNTER — Ambulatory Visit: Payer: Medicare Other

## 2014-07-31 ENCOUNTER — Ambulatory Visit (INDEPENDENT_AMBULATORY_CARE_PROVIDER_SITE_OTHER): Payer: Medicare Other

## 2014-07-31 DIAGNOSIS — Z1231 Encounter for screening mammogram for malignant neoplasm of breast: Secondary | ICD-10-CM

## 2014-07-31 DIAGNOSIS — Z139 Encounter for screening, unspecified: Secondary | ICD-10-CM

## 2014-08-02 ENCOUNTER — Ambulatory Visit: Payer: Medicare Other | Admitting: Family Medicine

## 2014-08-09 ENCOUNTER — Ambulatory Visit (INDEPENDENT_AMBULATORY_CARE_PROVIDER_SITE_OTHER): Payer: Medicare Other | Admitting: Family Medicine

## 2014-08-09 ENCOUNTER — Encounter: Payer: Self-pay | Admitting: Family Medicine

## 2014-08-09 VITALS — BP 138/84 | HR 65 | Wt 215.0 lb

## 2014-08-09 DIAGNOSIS — Z0000 Encounter for general adult medical examination without abnormal findings: Secondary | ICD-10-CM

## 2014-08-09 DIAGNOSIS — I10 Essential (primary) hypertension: Secondary | ICD-10-CM | POA: Diagnosis not present

## 2014-08-09 DIAGNOSIS — Z1159 Encounter for screening for other viral diseases: Secondary | ICD-10-CM

## 2014-08-09 DIAGNOSIS — Z23 Encounter for immunization: Secondary | ICD-10-CM

## 2014-08-09 MED ORDER — CHLORTHALIDONE 50 MG PO TABS: 50.0000 mg | ORAL_TABLET | Freq: Every day | ORAL | Status: DC

## 2014-08-09 NOTE — Progress Notes (Signed)
Subjective:    Erika Strong is a 69 y.o. female who presents for Medicare Annual/Subsequent preventive examination.  Preventive Screening-Counseling & Management  Tobacco History  Smoking status  . Never Smoker   Smokeless tobacco  . Never Used     Problems Prior to Visit 1. Was seen at [facility] for second opinoin for her cancer.   Current Problems (verified) Patient Active Problem List   Diagnosis Date Noted  . Hypersensitivity reaction 03/11/2014  . Hepatic steatosis 02/12/2014  . SIRS (systemic inflammatory response syndrome) 01/24/2014  . Anemia due to chemotherapy 01/23/2014  . Chemotherapy-induced peripheral neuropathy 01/23/2014  . Metastatic cancer to pelvis 06/23/2013  . Trigger finger, acquired 08/11/2012  . Degenerative disc disease, cervical 12/07/2011  . Overweight(278.02) 09/16/2011  . Metabolic syndrome X 52/77/8242  . Malignant neoplasm of corpus uteri, except isthmus 01/21/2011  . Endometrial cancer 11/02/2010  . Venous stasis 07/27/2010  . PROPTOSIS 01/26/2010  . OSTEOPENIA 06/04/2009  . DIVERTICULITIS, COLON 11/25/2008  . BACK PAIN 12/12/2007  . HYPERTENSION, BENIGN 07/24/2007  . HLD (hyperlipidemia) 04/14/2007  . POSTMENOPAUSAL STATUS 04/13/2007    Medications Prior to Visit Current Outpatient Prescriptions on File Prior to Visit  Medication Sig Dispense Refill  . acyclovir ointment (ZOVIRAX) 5 % Apply to fever blister 5 times a day 30 g 1  . amLODipine (NORVASC) 2.5 MG tablet Take 1 tablet (2.5 mg total) by mouth daily. 90 tablet 3  . Ascorbic Acid (VITAMIN C CR) 1000 MG TBCR Take 1,000 mg by mouth daily.    . B Complex-C (B-COMPLEX WITH VITAMIN C) tablet Take 1 tablet by mouth daily.    Marland Kitchen FLUoxetine (PROZAC) 10 MG capsule One po QD x 1 week, then increase to 2 tabs. 60 capsule 1  . megestrol (MEGACE) 40 MG tablet Take 1 tablet (40 mg total) by mouth 4 (four) times daily. 120 tablet 4  . OVER THE COUNTER MEDICATION Take 1 tablet by mouth daily.  Curamin    . OVER THE COUNTER MEDICATION Take 3 tablets by mouth daily. New Chapter Bone Strength (calcium product)    . OVER THE COUNTER MEDICATION Take 1 packet by mouth 2 (two) times daily. Preston Fleeting Vitamin Pak (contains fish oil, potassium and multi vitamins)    . OVER THE COUNTER MEDICATION Take 3 tablets by mouth 3 (three) times daily. Intestinal Sooth and Build    . OVER THE COUNTER MEDICATION Take 2 tablets by mouth 2 (two) times daily. Stress J    . OVER THE COUNTER MEDICATION Take 1 tablet by mouth daily. Liver Care    . OVER THE COUNTER MEDICATION Take 10 mLs by mouth daily with breakfast. Liquid chlorophyl    . OVER THE COUNTER MEDICATION Take 30 mLs by mouth daily with breakfast. Flax Seed Oil    . Probiotic Product (PROBIOTIC DAILY) CAPS Take 1 capsule by mouth daily.    Marland Kitchen pyridOXINE (VITAMIN B-6) 100 MG tablet Take 100 mg by mouth daily.    . traZODone (DESYREL) 50 MG tablet Take 0.5-1 tablets (25-50 mg total) by mouth at bedtime as needed for sleep. 30 tablet 3  . Ubiquinol 100 MG CAPS Take 100 mg by mouth daily with breakfast.      No current facility-administered medications on file prior to visit.    Current Medications (verified) Current Outpatient Prescriptions  Medication Sig Dispense Refill  . acyclovir ointment (ZOVIRAX) 5 % Apply to fever blister 5 times a day 30 g 1  . amLODipine (NORVASC)  2.5 MG tablet Take 1 tablet (2.5 mg total) by mouth daily. 90 tablet 3  . Ascorbic Acid (VITAMIN C CR) 1000 MG TBCR Take 1,000 mg by mouth daily.    . B Complex-C (B-COMPLEX WITH VITAMIN C) tablet Take 1 tablet by mouth daily.    Marland Kitchen FLUoxetine (PROZAC) 10 MG capsule One po QD x 1 week, then increase to 2 tabs. 60 capsule 1  . megestrol (MEGACE) 40 MG tablet Take 1 tablet (40 mg total) by mouth 4 (four) times daily. 120 tablet 4  . OVER THE COUNTER MEDICATION Take 1 tablet by mouth daily. Curamin    . OVER THE COUNTER MEDICATION Take 3 tablets by mouth daily. New Chapter  Bone Strength (calcium product)    . OVER THE COUNTER MEDICATION Take 1 packet by mouth 2 (two) times daily. Preston Fleeting Vitamin Pak (contains fish oil, potassium and multi vitamins)    . OVER THE COUNTER MEDICATION Take 3 tablets by mouth 3 (three) times daily. Intestinal Sooth and Build    . OVER THE COUNTER MEDICATION Take 2 tablets by mouth 2 (two) times daily. Stress J    . OVER THE COUNTER MEDICATION Take 1 tablet by mouth daily. Liver Care    . OVER THE COUNTER MEDICATION Take 10 mLs by mouth daily with breakfast. Liquid chlorophyl    . OVER THE COUNTER MEDICATION Take 30 mLs by mouth daily with breakfast. Flax Seed Oil    . Probiotic Product (PROBIOTIC DAILY) CAPS Take 1 capsule by mouth daily.    Marland Kitchen pyridOXINE (VITAMIN B-6) 100 MG tablet Take 100 mg by mouth daily.    . traZODone (DESYREL) 50 MG tablet Take 0.5-1 tablets (25-50 mg total) by mouth at bedtime as needed for sleep. 30 tablet 3  . Ubiquinol 100 MG CAPS Take 100 mg by mouth daily with breakfast.      No current facility-administered medications for this visit.     Allergies (verified) Granix; Atorvastatin; Fish allergy; Lisinopril; Pravastatin sodium; and Simvastatin   PAST HISTORY  Family History Family History  Problem Relation Age of Onset  . Lung cancer Father     smoker  . Cancer Father   . Osteoporosis Mother   . Rheum arthritis Mother   . Cancer Mother     Social History History  Substance Use Topics  . Smoking status: Never Smoker   . Smokeless tobacco: Never Used  . Alcohol Use: No     Are there smokers in your home (other than you)? No  Risk Factors Current exercise habits: The patient does not participate in regular exercise at present. haas been working osme in yard.   Dietary issues discussed: none   Cardiac risk factors: advanced age (older than 31 for men, 3 for women), hypertension, obesity (BMI >= 30 kg/m2) and sedentary lifestyle.  Depression Screen (Note: if answer to either  of the following is "Yes", a more complete depression screening is indicated)   Over the past two weeks, have you felt down, depressed or hopeless? No  Over the past two weeks, have you felt little interest or pleasure in doing things? No  Have you lost interest or pleasure in daily life? No  Do you often feel hopeless? No  Do you cry easily over simple problems? No  Activities of Daily Living In your present state of health, do you have any difficulty performing the following activities?:  Driving? No Managing money?  No Feeding yourself? No Getting from bed to  chair? No  Climbing a flight of stairs? No Preparing food and eating?: No Bathing or showering? No Getting dressed: No Getting to the toilet? No Using the toilet:No Moving around from place to place: No In the past year have you fallen or had a near fall?:No   Are you sexually active?  No  Do you have more than one partner?  No  Hearing Difficulties: No Do you often ask people to speak up or repeat themselves? No Do you experience ringing or noises in your ears? No Do you have difficulty understanding soft or whispered voices? No   Do you feel that you have a problem with memory? No  Do you often misplace items? No  Do you feel safe at home?  Yes  Cognitive Testing  Alert? Yes  Normal Appearance?Yes  Oriented to person? Yes  Place? Yes   Time? Yes  Recall of three objects?  Yes  Can perform simple calculations? Yes  Displays appropriate judgment?Yes  Can read the correct time from a watch face?Yes   6 CIT score of 2/28 ( normal)     Advanced Directives have been discussed with the patient? Yes  List the Names of Other Physician/Practitioners you currently use: 1.  Oncology at [facility].   Indicate any recent Medical Services you may have received from other than [facility] providers in the past year (date may be approximate).  Immunization History  Administered Date(s) Administered  . Influenza Split 01/01/2011,  01/11/2012  . Influenza Whole 11/30/2007, 11/22/2008  . Influenza,inj,Quad PF,36+ Mos 12/07/2012, 10/31/2013  . Pneumococcal Polysaccharide-23 09/22/2012  . Td 04/13/2007  . Zoster 03/19/2009    Screening Tests Health Maintenance  Topic Date Due  . Hepatitis C Screening  06/14/1945  . PNA vac Low Risk Adult (2 of 2 - PCV13) 09/22/2013  . INFLUENZA VACCINE  09/23/2014  . MAMMOGRAM  07/30/2016  . TETANUS/TDAP  04/12/2017  . COLONOSCOPY  07/03/2019  . DEXA SCAN  Completed  . ZOSTAVAX  Completed    All answers were reviewed with the patient and necessary referrals were made:  METHENEY,CATHERINE, MD   08/09/2014   History reviewed: allergies, current medications, past family history, past medical history, past social history, past surgical history and problem list  Review of Systems A comprehensive review of systems was negative.    Objective:     Vision by Snellen chart: vision appt scheduled.    Body mass index is 34.72 kg/(m^2). BP 154/79 mmHg  Pulse 65  Wt 215 lb (97.523 kg)  BP 154/79 mmHg  Pulse 65  Wt 215 lb (97.523 kg)     Assessment:     Medicare Annual Wellness Exam       Plan:     During the course of the visit the patient was educated and counseled about appropriate screening and preventive services including:    Advanced directives: has NO advanced directive - not interested in additional information   Screen hep c.   HTN - uncontrolled.  Will restart chlorthalidone.  F/U in 6 weeks for BP check.  She will monitor sat home.  Check potassium in one week.   Prevnar 13 given.   Diet review for nutrition referral? Yes ____  Not Indicated _X__   Patient Instructions (the written plan) was given to the patient.  Medicare Attestation I have personally reviewed: The patient's medical and social history Their use of alcohol, tobacco or illicit drugs Their current medications and supplements The patient's functional ability including ADLs,fall  risks, home safety risks, cognitive, and hearing and visual impairment Diet and physical activities Evidence for depression or mood disorders  The patient's weight, height, BMI, and visual acuity have been recorded in the chart.  I have made referrals, counseling, and provided education to the patient based on review of the above and I have provided the patient with a written personalized care plan for preventive services.     METHENEY,CATHERINE, MD   08/09/2014

## 2014-08-19 ENCOUNTER — Ambulatory Visit: Payer: Medicare Other

## 2014-08-19 ENCOUNTER — Ambulatory Visit: Payer: Medicare Other | Admitting: Oncology

## 2014-08-20 LAB — BASIC METABOLIC PANEL WITH GFR
BUN: 12 mg/dL (ref 6–23)
CO2: 29 mEq/L (ref 19–32)
Calcium: 10.2 mg/dL (ref 8.4–10.5)
Chloride: 102 mEq/L (ref 96–112)
Creat: 0.91 mg/dL (ref 0.50–1.10)
GFR, Est African American: 76 mL/min
GFR, Est Non African American: 65 mL/min
Glucose, Bld: 84 mg/dL (ref 70–99)
Potassium: 3.9 mEq/L (ref 3.5–5.3)
Sodium: 140 mEq/L (ref 135–145)

## 2014-08-20 LAB — HEPATITIS C ANTIBODY: HCV Ab: NEGATIVE

## 2014-08-21 ENCOUNTER — Telehealth: Payer: Self-pay | Admitting: *Deleted

## 2014-08-21 NOTE — Telephone Encounter (Signed)
Patient called requesting to cancel appt with Dr. Skeet Latch scheduled for 08/22/14 and for lab and Dr. Marko Plume on 08/27/14. She states she has been seeing Dr. Fransisca Connors at [facility] and does not have a need to be seen in [place] at this time. Patient states "I feel better than I have in 10 years! I have no pain or aches or anything right now." Patient reports she is still taking Megace BID at home and is scheduled for a restaging CT on 09/13/14 at [facility]. She is agreeable to get [facility] to send a copy of the scan to [facility]. Encouraged patient to please call our office with any questions or concerns or if we can help her in anyway - patient agreeable to this and wanted to let Dr. Marko Plume and Dr. Skeet Latch know how much she appreciates their care and is agreeable to keep them updated on her status.

## 2014-08-22 ENCOUNTER — Ambulatory Visit: Payer: Medicare Other | Admitting: Gynecologic Oncology

## 2014-08-27 ENCOUNTER — Telehealth: Payer: Self-pay | Admitting: *Deleted

## 2014-08-27 ENCOUNTER — Ambulatory Visit: Payer: Medicare Other

## 2014-08-27 ENCOUNTER — Ambulatory Visit: Payer: Medicare Other | Admitting: Oncology

## 2014-08-27 NOTE — Telephone Encounter (Signed)
Pt lvm asking that her lab results be sent to her home. Results printed and mailed lvm informing pt of this.Erika Strong [place]

## 2014-09-23 ENCOUNTER — Ambulatory Visit (INDEPENDENT_AMBULATORY_CARE_PROVIDER_SITE_OTHER): Payer: Medicare Other | Admitting: Family Medicine

## 2014-09-23 ENCOUNTER — Encounter: Payer: Self-pay | Admitting: Family Medicine

## 2014-09-23 VITALS — BP 136/72 | HR 69 | Ht 66.0 in | Wt 219.0 lb

## 2014-09-23 DIAGNOSIS — I10 Essential (primary) hypertension: Secondary | ICD-10-CM | POA: Diagnosis not present

## 2014-09-23 DIAGNOSIS — C7989 Secondary malignant neoplasm of other specified sites: Secondary | ICD-10-CM

## 2014-09-23 DIAGNOSIS — C541 Malignant neoplasm of endometrium: Secondary | ICD-10-CM | POA: Diagnosis not present

## 2014-09-23 NOTE — Progress Notes (Signed)
   Subjective:    Patient ID: Erika Strong, female    DOB: 05-20-1945, 69 y.o.   MRN: [id number]  HPI  Hypertension- Pt denies chest pain, SOB, dizziness, or heart palpitations.  Taking meds as directed w/o problems.  Denies medication side effects.  She is taking half a tab of chlorthalidone daily and she taking the amlodipine.    She weaned her fluxoetine and is doing well. She initially was started on this because she was strongly a little bit with her diagnosis emotionally.  She is doing well on the megestrol and recent scan shows reduction in size of tumor. No Side effects. She has gained some weight. She planon doing more exercise.  She has gained about 7 lbs.       Review of Systems     Objective:   Physical Exam  Constitutional: She is oriented to person, place, and time. She appears well-developed and well-nourished.  HENT:  Head: Normocephalic and atraumatic.  Cardiovascular: Normal rate, regular rhythm and normal heart sounds.   Pulmonary/Chest: Effort normal and breath sounds normal.  Neurological: She is alert and oriented to person, place, and time.  Skin: Skin is warm and dry.  Psychiatric: She has a normal mood and affect. Her behavior is normal.          Assessment & Plan:  HTN -  Well controlled. Continue current regimen. F/U in 6 months. Due for lipds in December.   Endometrial cancer, metastasized-doing well on megestrol. This will likely not put her into complete remission but will hopefully control the tumor. We did discuss getting more exercise and watching her diet as she has gained a fair amount of weight on the medication. We discussed that this is one of potential side effects that she is going to have to contend with.  Needs her port flushed mid September. One significant be done here locally.

## 2014-10-02 ENCOUNTER — Telehealth: Payer: Self-pay | Admitting: Family Medicine

## 2014-10-02 NOTE — Telephone Encounter (Signed)
Please call day surgery center and or [place] to see if patient would be able to get a port flushed there. She actually follows at [facility] oncology but wants to be able to get her port flushed locally.

## 2014-10-02 NOTE — Telephone Encounter (Signed)
I called the surgery day center, they do not flush ports they only place them. [place], they cannot flush a port unless she is being treated by one of their physicians.

## 2014-10-02 NOTE — Telephone Encounter (Signed)
Okay, thank you for checking on that for me. We please call the patient and let her know what we found out. Thank you.

## 2014-10-02 NOTE — Telephone Encounter (Signed)
Left message with Pt and advised if she had any ideas of another location to let us know and we would be happy to call.

## 2014-10-03 ENCOUNTER — Ambulatory Visit: Payer: Medicare Other | Admitting: Gynecologic Oncology

## 2014-11-19 ENCOUNTER — Ambulatory Visit (INDEPENDENT_AMBULATORY_CARE_PROVIDER_SITE_OTHER): Payer: Medicare Other | Admitting: Family Medicine

## 2014-11-19 ENCOUNTER — Encounter: Payer: Self-pay | Admitting: Family Medicine

## 2014-11-19 VITALS — BP 140/70 | HR 86 | Temp 98.6°F | Ht 66.0 in | Wt 223.0 lb

## 2014-11-19 DIAGNOSIS — I10 Essential (primary) hypertension: Secondary | ICD-10-CM

## 2014-11-19 DIAGNOSIS — Z23 Encounter for immunization: Secondary | ICD-10-CM | POA: Diagnosis not present

## 2014-11-19 NOTE — Progress Notes (Signed)
   Subjective:    Patient ID: Erika Strong, female    DOB: Aug 26, 1945, 70 y.o.   MRN: [id number]  HPI Hypertension- Pt denies chest pain, SOB, dizziness, or heart palpitations.  Taking meds as directed w/o problems.  Denies medication side effects.   pt wants to know if she can just take the Chlorthalidone ( whole tab) alone and d/c amlodipine. She used to take a whole tab of the chlorthalidone before she started her chemotherapy and at some point was decreased and she was put on a small dose of amlodipine with it for blood pressure control. Now that she is done with her chemotherapy she liked get back on a whole tab chlorthalidone which she feels helps her fluid retention more. Creatinine was 1 and GF was 55 done at [facility] about 2 weeks ago.  Home BP running in the 120s.     Review of Systems     Objective:   Physical Exam  Constitutional: She is oriented to person, place, and time. She appears well-developed and well-nourished.  HENT:  Head: Normocephalic and atraumatic.  Cardiovascular: Normal rate, regular rhythm and normal heart sounds.   Pulmonary/Chest: Effort normal and breath sounds normal.  Neurological: She is alert and oriented to person, place, and time.  Skin: Skin is warm and dry.  Psychiatric: She has a normal mood and affect. Her behavior is normal.          Assessment & Plan:  HTN - Ok to try to go up on the chlorthalidone to whole tab ( 50mg  for one week) and hold the amlodipine. She will follow her blood pressure over the next week and go for BMP in one week to make sure the kidney function is stable. Discussed the importance of keeping the kidneys happy with stable function. I still like to see her back in one month just make sure the blood pressure is well-controlled and that we don't need to restart the amlodipine.  Note-her blood pressure is high today. She thinks it's because of the lunch that she had today. She had apple pie, fried chicken, and macaroni and  cheese. She knows she needs to do better with her diet.  he.

## 2014-11-27 LAB — BASIC METABOLIC PANEL WITH GFR
BUN: 12 mg/dL (ref 7–25)
CHLORIDE: 102 mmol/L (ref 98–110)
CO2: 29 mmol/L (ref 20–31)
Calcium: 9.3 mg/dL (ref 8.6–10.4)
Creat: 0.82 mg/dL (ref 0.50–0.99)
GFR, Est African American: 86 mL/min (ref >=60)
GFR, Est Non African American: 74 mL/min (ref >=60)
Glucose, Bld: 80 mg/dL (ref 65–99)
Potassium: 3.3 mmol/L — ABNORMAL LOW (ref 3.5–5.3)
SODIUM: 139 mmol/L (ref 135–146)

## 2014-12-17 ENCOUNTER — Encounter: Payer: Self-pay | Admitting: Family Medicine

## 2014-12-17 ENCOUNTER — Ambulatory Visit (INDEPENDENT_AMBULATORY_CARE_PROVIDER_SITE_OTHER): Payer: Medicare Other | Admitting: Family Medicine

## 2014-12-17 VITALS — BP 132/70 | HR 96 | Wt 221.0 lb

## 2014-12-17 DIAGNOSIS — E876 Hypokalemia: Secondary | ICD-10-CM | POA: Diagnosis not present

## 2014-12-17 DIAGNOSIS — I10 Essential (primary) hypertension: Secondary | ICD-10-CM | POA: Diagnosis not present

## 2014-12-17 DIAGNOSIS — C549 Malignant neoplasm of corpus uteri, unspecified: Secondary | ICD-10-CM

## 2014-12-17 NOTE — Progress Notes (Signed)
   Subjective:    Patient ID: Erika Strong, female    DOB: [birth year], 69 y.o.   MRN: [id number]  HPI Hypertension- Pt denies chest pain, SOB, dizziness, or heart palpitations.  Taking meds as directed w/o problems.  Denies medication side effects.  I last saw her about 4 weeks ago and we decided to go up on her chlorthalidone to 50 mg and hold the amlodipine. She did go for her labs and potassium which is borderline low. She had to be on liquid potassium while she was on chemo and has 2 bottles at home.  On swelling.     Review of Systems     Objective:   Physical Exam  Constitutional: She is oriented to person, place, and time. She appears well-developed and well-nourished.  HENT:  Head: Normocephalic and atraumatic.  Cardiovascular: Normal rate, regular rhythm and normal heart sounds.   Pulmonary/Chest: Effort normal and breath sounds normal.  Neurological: She is alert and oriented to person, place, and time.  Skin: Skin is warm and dry.  Psychiatric: She has a normal mood and affect. Her behavior is normal.          Assessment & Plan:  HTN - well controlled. Continue current regimen. Follow up in 6 months.  Low potassium - due to recheck levels. If it's still mildly low then we may have her restart her oral potassium. She prefers the liquid potassium. She will just seemed to cause some let us know what dose her strength she has at home.  Metastatic endometrial adenocarcinoma-she is continuing on her Megace and tolerating it well. She's due for follow-up scans seen.

## 2014-12-18 LAB — POTASSIUM: Potassium: 4.3 mmol/L (ref 3.5–5.3)

## 2015-01-01 ENCOUNTER — Ambulatory Visit (INDEPENDENT_AMBULATORY_CARE_PROVIDER_SITE_OTHER): Payer: Medicare Other | Admitting: Family Medicine

## 2015-01-01 ENCOUNTER — Ambulatory Visit (INDEPENDENT_AMBULATORY_CARE_PROVIDER_SITE_OTHER): Payer: Medicare Other

## 2015-01-01 ENCOUNTER — Encounter: Payer: Self-pay | Admitting: Family Medicine

## 2015-01-01 VITALS — BP 158/84 | HR 100 | Temp 101.1°F | Resp 16 | Wt 219.0 lb

## 2015-01-01 DIAGNOSIS — M79621 Pain in right upper arm: Secondary | ICD-10-CM | POA: Diagnosis not present

## 2015-01-01 DIAGNOSIS — R509 Fever, unspecified: Secondary | ICD-10-CM | POA: Diagnosis not present

## 2015-01-01 DIAGNOSIS — W108XXA Fall (on) (from) other stairs and steps, initial encounter: Secondary | ICD-10-CM

## 2015-01-01 DIAGNOSIS — R748 Abnormal levels of other serum enzymes: Secondary | ICD-10-CM

## 2015-01-01 DIAGNOSIS — R0602 Shortness of breath: Secondary | ICD-10-CM

## 2015-01-01 DIAGNOSIS — R238 Other skin changes: Secondary | ICD-10-CM

## 2015-01-01 DIAGNOSIS — C541 Malignant neoplasm of endometrium: Secondary | ICD-10-CM

## 2015-01-01 DIAGNOSIS — R918 Other nonspecific abnormal finding of lung field: Secondary | ICD-10-CM

## 2015-01-01 DIAGNOSIS — D72829 Elevated white blood cell count, unspecified: Secondary | ICD-10-CM

## 2015-01-01 DIAGNOSIS — J90 Pleural effusion, not elsewhere classified: Secondary | ICD-10-CM | POA: Diagnosis not present

## 2015-01-01 DIAGNOSIS — M79601 Pain in right arm: Secondary | ICD-10-CM

## 2015-01-01 DIAGNOSIS — W109XXA Fall (on) (from) unspecified stairs and steps, initial encounter: Secondary | ICD-10-CM

## 2015-01-01 LAB — COMPLETE METABOLIC PANEL WITH GFR
ALBUMIN: 3.4 g/dL — AB (ref 3.6–5.1)
ALK PHOS: 84 U/L (ref 33–130)
ALT: 48 U/L — ABNORMAL HIGH (ref 6–29)
AST: 51 U/L — ABNORMAL HIGH (ref 10–35)
BUN: 9 mg/dL (ref 7–25)
CO2: 26 mmol/L (ref 20–31)
Calcium: 8.7 mg/dL (ref 8.6–10.4)
Chloride: 90 mmol/L — ABNORMAL LOW (ref 98–110)
Creat: 0.74 mg/dL (ref 0.50–0.99)
GFR, EST NON AFRICAN AMERICAN: 84 mL/min (ref >=60)
GFR, Est African American: >89 mL/min (ref >=60)
GLUCOSE: 96 mg/dL (ref 65–99)
Potassium: 3.7 mmol/L (ref 3.5–5.3)
SODIUM: 130 mmol/L — AB (ref 135–146)
Total Bilirubin: 0.9 mg/dL (ref 0.2–1.2)
Total Protein: 6.8 g/dL (ref 6.1–8.1)

## 2015-01-01 MED ORDER — LEVOFLOXACIN 750 MG PO TABS: 750.0000 mg | ORAL_TABLET | Freq: Every day | ORAL | Status: DC

## 2015-01-01 MED ORDER — MEGESTROL ACETATE 40 MG PO TABS: 80.0000 mg | ORAL_TABLET | Freq: Two times a day (BID) | ORAL | Status: DC

## 2015-01-01 NOTE — Progress Notes (Signed)
   Subjective:    Patient ID: Erika Strong, female    DOB: 05-Oct-1945, 69 y.o.   MRN: [id number]  HPI Patient is here today for hospital follow-up. She [facility] for fever and syncope. She was diagnosed with a viral syndrome. Has had a fever for about 5 days.  Max 101. at home.  Has been using extra strength tylenol. They did labs, EKG, CXR and culture. Negative swab for flu.  Mild runny nose. No ST. + SOB.  No urinary sxs.  No abdominal pain.  + nausea. No vomiting. Took 2 tylenol about 4.5 hours ago.   She does have a blister on her right upper arm. Says it hasn't bothered her. No itching. No other lesion. She has been using a heating pad on that arm. Hit it when she feel and it is still really painful.  They did not detect x-rays in the emergency department. She's not seen any bruising on the arm or swelling.  Review of Systems     Objective:   Physical Exam  Constitutional: She is oriented to person, place, and time. She appears well-developed and well-nourished.  HENT:  Head: Normocephalic and atraumatic.  Right Ear: External ear normal.  Left Ear: External ear normal.  Nose: Nose normal.  Mouth/Throat: Oropharynx is clear and moist.  TMs and canals are clear.   Eyes: Conjunctivae and EOM are normal. Pupils are equal, round, and reactive to light.  Neck: Neck supple. No thyromegaly present.  Cardiovascular: Normal rate, regular rhythm and normal heart sounds.   Pulmonary/Chest: Effort normal and breath sounds normal. She has no wheezes.  Crackles at her left lower lung base.  Right is clear.   Lymphadenopathy:    She has no cervical adenopathy.  Neurological: She is alert and oriented to person, place, and time.  Skin: Skin is warm and dry.  Psychiatric: She has a normal mood and affect.   Right upper arm is tender posteriorly. No bruising or swelling she does have 2 blister over the upper arm near the elbow.  One blister is approx  1 cm in size and the 2nd is about 0.5 cm.  she is able to raise her shoulder to about 100 but it's painful, not in the shoulder joint itself but it over the humerus.        Assessment & Plan:  Fever with SOB- will get xray as she has crackle on the left on CXR.  Most concerning for pneumonia. She did have a chest x-ray done 3 days ago in the ED but it was negative at that time. We will call with results once available. Also recheck white blood cell count to see if it's trending up or down. Last white blood cell count was 16,000 in the ED 3 days ago.  Vesicles, right upper arm - viral culture obtained.  Could also be a burn from the heating pad.    High WBC- recheck level to see if trending up or down  Hyponatremia/hypokalemia - recheck electrolytes.  Her sodium potassium and chloride were low. She did receive a bag of IV fluids and some potassium while there. She does have some liquid potassium at home but does not take it regularly.  Right upper arm pain - will get xray of the humerus.

## 2015-01-01 NOTE — Addendum Note (Signed)
Addended by: Teddy Spike on: 01/01/2015 12:45 PM   Modules accepted: Orders

## 2015-01-02 LAB — CBC WITH DIFFERENTIAL/PLATELET
BASOS PCT: 0 % (ref 0–1)
Basophils Absolute: 0 10*3/uL (ref 0.0–0.1)
Eosinophils Absolute: 0.1 10*3/uL (ref 0.0–0.7)
Eosinophils Relative: 1 % (ref 0–5)
HCT: 35.8 % — ABNORMAL LOW (ref 36.0–46.0)
HEMOGLOBIN: 12.6 g/dL (ref 12.0–15.0)
LYMPHS ABS: 1.5 10*3/uL (ref 0.7–4.0)
Lymphocytes Relative: 10 % — ABNORMAL LOW (ref 12–46)
MCH: 31 pg (ref 26.0–34.0)
MCHC: 35.2 g/dL (ref 30.0–36.0)
MCV: 88.2 fL (ref 78.0–100.0)
MPV: 10.4 fL (ref 8.6–12.4)
Monocytes Absolute: 1.6 10*3/uL — ABNORMAL HIGH (ref 0.1–1.0)
Monocytes Relative: 11 % (ref 3–12)
NEUTROS PCT: 78 % — AB (ref 43–77)
Neutro Abs: 11.6 10*3/uL — ABNORMAL HIGH (ref 1.7–7.7)
Platelets: 190 10*3/uL (ref 150–400)
RBC: 4.06 MIL/uL (ref 3.87–5.11)
RDW: 14 % (ref 11.5–15.5)
WBC: 14.9 10*3/uL — ABNORMAL HIGH (ref 4.0–10.5)

## 2015-01-02 NOTE — Addendum Note (Signed)
Addended by: Teddy Spike on: 01/02/2015 01:59 PM   Modules accepted: Orders

## 2015-01-02 NOTE — Addendum Note (Signed)
Addended by: Teddy Spike on: 01/02/2015 05:21 PM   Modules accepted: Orders

## 2015-01-03 LAB — HERPES SIMPLEX VIRUS CULTURE: Organism ID, Bacteria: NOT DETECTED

## 2015-01-06 ENCOUNTER — Ambulatory Visit (INDEPENDENT_AMBULATORY_CARE_PROVIDER_SITE_OTHER): Payer: Medicare Other | Admitting: Family Medicine

## 2015-01-06 ENCOUNTER — Encounter: Payer: Self-pay | Admitting: Family Medicine

## 2015-01-06 VITALS — BP 146/94 | HR 89 | Temp 97.9°F | Resp 18

## 2015-01-06 DIAGNOSIS — J189 Pneumonia, unspecified organism: Secondary | ICD-10-CM | POA: Diagnosis not present

## 2015-01-06 DIAGNOSIS — J90 Pleural effusion, not elsewhere classified: Secondary | ICD-10-CM

## 2015-01-06 DIAGNOSIS — J181 Lobar pneumonia, unspecified organism: Principal | ICD-10-CM

## 2015-01-06 DIAGNOSIS — J948 Other specified pleural conditions: Secondary | ICD-10-CM

## 2015-01-06 NOTE — Progress Notes (Signed)
   Subjective:    Patient ID: Erika Strong, female    DOB: 1945-07-01, 69 y.o.   MRN: [id number]  HPI Here today to follow-up for left lower lobe pneumonia with small left pleural effusion. She is feeling much better overall. Fever free for 3 days.  Last night was the last dose on the antibiotic.  Energy level is better , but not quite back to baseline. Not feeling short of breath. Feels like her appetite is getting back to normal.   Review of Systems     Objective:   Physical Exam  Constitutional: She is oriented to person, place, and time. She appears well-developed and well-nourished.  HENT:  Head: Normocephalic and atraumatic.  Right Ear: External ear normal.  Left Ear: External ear normal.  Nose: Nose normal.  Mouth/Throat: Oropharynx is clear and moist.  TMs and canals are clear.   Eyes: Conjunctivae and EOM are normal. Pupils are equal, round, and reactive to light.  Neck: Neck supple. No thyromegaly present.  Cardiovascular: Normal rate, regular rhythm and normal heart sounds.   Pulmonary/Chest: Effort normal and breath sounds normal. She has no wheezes.  + crackles on the left lung base.   Lymphadenopathy:    She has no cervical adenopathy.  Neurological: She is alert and oriented to person, place, and time.  Skin: Skin is warm and dry.  Psychiatric: She has a normal mood and affect.          Assessment & Plan:  Left lower node pneumonia-I do still hear some faint crackles at the left lower base. Recommend repeat chest x-ray in 2-3 weeks just to make sure that the fluid is clearing. If not then we will let her doctors at [facility] know what's going on. She is significantly better than when I saw her last week. And she's now been afebrile for the last 3 days which is also reassuring. I did ask her to call the office back if she feels like she suddenly getting worse this week. She says she plans to not work this winter just to stay away from thyroid cyst and colds etc.

## 2015-01-09 LAB — VIRAL CULTURE VIRC

## 2015-01-10 ENCOUNTER — Telehealth: Payer: Self-pay | Admitting: Emergency Medicine

## 2015-01-10 NOTE — Telephone Encounter (Signed)
[facility] lab called to say a viral culture specimen ordered 01-01-15 was lost; no results will be available.

## 2015-01-22 ENCOUNTER — Ambulatory Visit (INDEPENDENT_AMBULATORY_CARE_PROVIDER_SITE_OTHER): Payer: Medicare Other

## 2015-01-22 ENCOUNTER — Other Ambulatory Visit: Payer: Self-pay | Admitting: Family Medicine

## 2015-01-22 DIAGNOSIS — J189 Pneumonia, unspecified organism: Secondary | ICD-10-CM | POA: Diagnosis not present

## 2015-01-23 LAB — COMPLETE METABOLIC PANEL WITH GFR
ALBUMIN: 3.9 g/dL (ref 3.6–5.1)
ALK PHOS: 122 U/L (ref 33–130)
ALT: 23 U/L (ref 6–29)
AST: 25 U/L (ref 10–35)
BUN: 12 mg/dL (ref 7–25)
CALCIUM: 9.4 mg/dL (ref 8.6–10.4)
CO2: 30 mmol/L (ref 20–31)
Chloride: 99 mmol/L (ref 98–110)
Creat: 0.77 mg/dL (ref 0.50–0.99)
GFR, Est African American: >89 mL/min (ref >=60)
GFR, Est Non African American: 80 mL/min (ref >=60)
Glucose, Bld: 76 mg/dL (ref 65–99)
POTASSIUM: 3.7 mmol/L (ref 3.5–5.3)
Sodium: 137 mmol/L (ref 135–146)
Total Bilirubin: 0.5 mg/dL (ref 0.2–1.2)
Total Protein: 6.7 g/dL (ref 6.1–8.1)

## 2015-03-26 ENCOUNTER — Ambulatory Visit: Payer: Medicare Other | Admitting: Family Medicine

## 2015-05-07 ENCOUNTER — Other Ambulatory Visit: Payer: Self-pay | Admitting: Family Medicine

## 2015-06-17 ENCOUNTER — Encounter: Payer: Self-pay | Admitting: Family Medicine

## 2015-06-17 ENCOUNTER — Ambulatory Visit (INDEPENDENT_AMBULATORY_CARE_PROVIDER_SITE_OTHER): Payer: Medicare Other | Admitting: Family Medicine

## 2015-06-17 VITALS — BP 138/78 | HR 66 | Wt 221.0 lb

## 2015-06-17 DIAGNOSIS — C541 Malignant neoplasm of endometrium: Secondary | ICD-10-CM

## 2015-06-17 DIAGNOSIS — C7989 Secondary malignant neoplasm of other specified sites: Secondary | ICD-10-CM | POA: Diagnosis not present

## 2015-06-17 DIAGNOSIS — I10 Essential (primary) hypertension: Secondary | ICD-10-CM | POA: Diagnosis not present

## 2015-06-17 NOTE — Progress Notes (Signed)
   Subjective:    Patient ID: Erika Strong, female    DOB: 1945/05/08, 70 y.o.   MRN: [id number]  HPI Hypertension- Pt denies chest pain, SOB, dizziness, or heart palpitations.  Taking meds as directed w/o problems.  Denies medication side effects.  He is currently on chlorthalidone daily.  Metastatic endometrial adenocarcinoma.  She's currently on Megace for treatment. She saw Dr. Limmie Patricia at [facility] and he discussed with her that she has gained about 7 pounds on the medication and really wanted her to get back on track with diet and exercise to get the weight back off. He did explain to her that Megace can cause weight gain. She did have a CT scan done in January but only showed 2 small lesions that they are still following.    Review of Systems     Objective:   Physical Exam  Constitutional: She is oriented to person, place, and time. She appears well-developed and well-nourished.  HENT:  Head: Normocephalic and atraumatic.  Cardiovascular: Normal rate, regular rhythm and normal heart sounds.   Pulmonary/Chest: Effort normal and breath sounds normal.  Neurological: She is alert and oriented to person, place, and time.  Skin: Skin is warm and dry.  Psychiatric: She has a normal mood and affect. Her behavior is normal.          Assessment & Plan:  HTN - Well controlled. Continue current regimen. Follow up in 6 month.    Metastatic endometrial adenocarcinoma - continue with Megace. Following at [facility] regularly.

## 2015-06-18 ENCOUNTER — Other Ambulatory Visit: Payer: Self-pay | Admitting: Family Medicine

## 2015-06-18 DIAGNOSIS — Z1231 Encounter for screening mammogram for malignant neoplasm of breast: Secondary | ICD-10-CM

## 2015-06-18 LAB — LIPID PANEL
CHOL/HDL RATIO: 5.4 ratio — AB (ref <=5.0)
Cholesterol: 209 mg/dL — ABNORMAL HIGH (ref 125–200)
HDL: 39 mg/dL — ABNORMAL LOW (ref >=46)
LDL CALC: 146 mg/dL — AB (ref <130)
Triglycerides: 120 mg/dL (ref <150)
VLDL: 24 mg/dL (ref <30)

## 2015-06-18 LAB — BASIC METABOLIC PANEL WITH GFR
BUN: 15 mg/dL (ref 7–25)
CALCIUM: 9.7 mg/dL (ref 8.6–10.4)
CO2: 24 mmol/L (ref 20–31)
Chloride: 102 mmol/L (ref 98–110)
Creat: 0.9 mg/dL (ref 0.50–0.99)
GFR, EST AFRICAN AMERICAN: 76 mL/min (ref >=60)
GFR, Est Non African American: 66 mL/min (ref >=60)
GLUCOSE: 82 mg/dL (ref 65–99)
POTASSIUM: 4.2 mmol/L (ref 3.5–5.3)
Sodium: 139 mmol/L (ref 135–146)

## 2015-08-05 ENCOUNTER — Other Ambulatory Visit: Payer: Self-pay | Admitting: Family Medicine

## 2015-08-06 ENCOUNTER — Ambulatory Visit (INDEPENDENT_AMBULATORY_CARE_PROVIDER_SITE_OTHER): Payer: Medicare Other

## 2015-08-06 DIAGNOSIS — Z1231 Encounter for screening mammogram for malignant neoplasm of breast: Secondary | ICD-10-CM

## 2015-10-30 ENCOUNTER — Other Ambulatory Visit: Payer: Self-pay | Admitting: Family Medicine

## 2015-12-22 ENCOUNTER — Encounter: Payer: Self-pay | Admitting: Family Medicine

## 2015-12-22 ENCOUNTER — Ambulatory Visit (INDEPENDENT_AMBULATORY_CARE_PROVIDER_SITE_OTHER): Payer: Medicare Other | Admitting: Family Medicine

## 2015-12-22 VITALS — BP 128/76 | HR 65 | Ht 65.0 in | Wt 222.0 lb

## 2015-12-22 DIAGNOSIS — I10 Essential (primary) hypertension: Secondary | ICD-10-CM | POA: Diagnosis not present

## 2015-12-22 DIAGNOSIS — C7989 Secondary malignant neoplasm of other specified sites: Secondary | ICD-10-CM

## 2015-12-22 DIAGNOSIS — Z23 Encounter for immunization: Secondary | ICD-10-CM

## 2015-12-22 DIAGNOSIS — G589 Mononeuropathy, unspecified: Secondary | ICD-10-CM

## 2015-12-22 DIAGNOSIS — C541 Malignant neoplasm of endometrium: Secondary | ICD-10-CM | POA: Diagnosis not present

## 2015-12-22 LAB — COMPLETE METABOLIC PANEL WITH GFR
ALBUMIN: 4.2 g/dL (ref 3.6–5.1)
ALK PHOS: 51 U/L (ref 33–130)
ALT: 18 U/L (ref 6–29)
AST: 24 U/L (ref 10–35)
BILIRUBIN TOTAL: 0.7 mg/dL (ref 0.2–1.2)
BUN: 14 mg/dL (ref 7–25)
CALCIUM: 9.7 mg/dL (ref 8.6–10.4)
CO2: 29 mmol/L (ref 20–31)
Chloride: 102 mmol/L (ref 98–110)
Creat: 0.9 mg/dL (ref 0.50–0.99)
GFR, EST NON AFRICAN AMERICAN: 66 mL/min (ref >=60)
GFR, Est African American: 76 mL/min (ref >=60)
Glucose, Bld: 82 mg/dL (ref 65–99)
POTASSIUM: 4 mmol/L (ref 3.5–5.3)
Sodium: 138 mmol/L (ref 135–146)
TOTAL PROTEIN: 6.7 g/dL (ref 6.1–8.1)

## 2015-12-22 NOTE — Progress Notes (Signed)
Subjective:    CC: HTN  HPI: Hypertension- Pt denies chest pain, SOB, dizziness, or heart palpitations.  Taking meds as directed w/o problems.  Denies medication side effects.    Burning sensation on right upper thigh just lateral. She said it started towards the end of the summer. 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 She says it has been gradually getting better on its own. She denies any known injury or trauma to the area. She says she denies any spasms in the muscle or weakness. It just feels like a superficial burning sensation.  Endometrial cancer-she is not currently in remission dissection doing very well on her current chemotherapy regimen. One of the lesions has disappeared and the other is getting smaller. She's currently being followed at [facility].  Past medical history, Surgical history, Family history not pertinant except as noted below, Social history, Allergies, and medications have been entered into the medical record, reviewed, and corrections made.   Review of Systems: No fevers, chills, night sweats, weight loss, chest pain, or shortness of breath.   Objective:    General: Well Developed, well nourished, and in no acute distress.  Neuro: Alert and oriented x3, extra-ocular muscles intact, sensation grossly intact.  HEENT: Normocephalic, atraumatic  Skin: Warm and dry, no rashes. Cardiac: Regular rate and rhythm, no murmurs rubs or gallops, no lower extremity edema.  Respiratory: Clear to auscultation bilaterally. Not using accessory muscles, speaking in full sentences. MSK: Hips, knees, ankle strength is 5 out of 5 bilaterally. Normal range of motion. Negative straight leg raise. Patellar reflexes 1+ bilaterally. Nontender over the thigh itself.   Impression and Recommendations:   HTN - Well controlled. Continue current regimen. Follow up in  6 mo. Due for CMP.   Mononeuropathy - Avoid wearing tight fitting pants. She  feels like it is actually gradually getting better so we'll just give it a few more weeks to see if it resolves. If it persists or suddenly gets worse then recommend that we take a better look particularly at her spine especially with her history of endometrial cancer that is still actively being treated.  Endometrial cancer-being followed at [facility]. Currently on Megace and ubiquinol

## 2015-12-23 NOTE — Progress Notes (Signed)
All labs are normal. 

## 2016-02-02 ENCOUNTER — Other Ambulatory Visit: Payer: Self-pay | Admitting: Family Medicine

## 2016-02-16 IMAGING — PT NM PET TUM IMG RESTAG (PS) SKULL BASE T - THIGH
1 of 8 series · 1 of 25 positions shown · non-contrast
Comparison: CT scans 11/15/2013 and 02/08/2014

CLINICAL DATA: Subsequent treatment strategy for recurrent
endometrial carcinoma.

EXAM:
NUCLEAR MEDICINE PET SKULL BASE TO THIGH
TECHNIQUE: 10.5 mCi F-18 FDG was injected intravenously. Full-ring PET imaging
was performed from the skull base to thigh after the radiotracer. CT
data was obtained and used for attenuation correction and anatomic
localization.
FASTING BLOOD GLUCOSE:  Value: 96 mg/dl

[Series 4: ct sk_thigh 5.0 hd_fov · axial · 5.0mm · 1.13mm/px · 1 of 236 slices shown]
[im 236/236  brain]
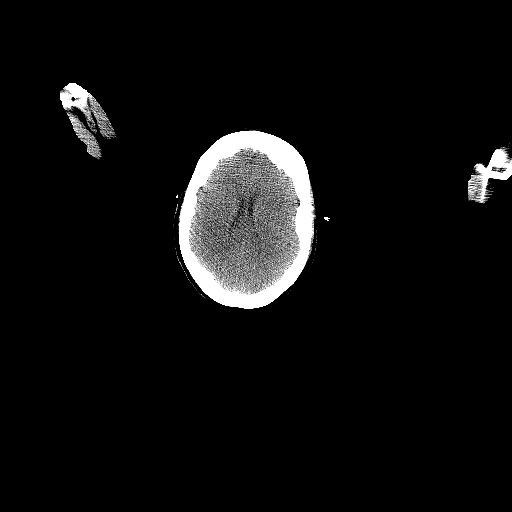

[1 of 25 positions shown; findings below may reference images not displayed]

FINDINGS: NECK

No hypermetabolic lymph nodes in the neck.

CHEST

No hypermetabolic mediastinal or hilar nodes. No suspicious
pulmonary nodules on the CT scan. There is patchy hypermetabolism
noted in the lower lung zones bilaterally which is probably
inflammation and/or atelectasis I do not see any focal airspace
consolidation on the CT scan or worrisome pulmonary lesions. No
pleural effusion.

ABDOMEN/PELVIS

Interval enlargement of the omental mass located just below the
level of the iliac crest anteriorly. On the prior CT scan it
measured 24 x 17.5 mm and now measures 31 x 24 mm. It is
metabolically active with SUV max of 9.25.

There is a right pelvic sidewall soft tissue mass which is also
enlarged since the prior CT scan. It measures approximately 15.5 x
13.5 mm and SUV max is 8.2.

Soft tissue mass in the obturator region on the right side has
enlarged since the prior CT scan. It measures approximately 20 x 14
mm and is hypermetabolic with SUV max of 8.5.

Small, weakly positive inguinal lymph nodes are noted bilaterally.
These are likely inflammatory and not metastatic disease.

SKELETON

There is a single hypermetabolic lesion in the subtrochanteric
region of the right femur. SUV max is 3.25. There appears to be
slight increased marrow attenuation on the CT scan. I believe this
was present on prior CT scans. It may be a benign process such is
fibrous dysplasia as I do not see any other definite bone lesions.
Surveillance is recommended.
IMPRESSION: 1. Enlarging hypermetabolic pelvic lesions as discussed above
suggesting progressive disease when compared to prior CT scan.
2. Probable inflammatory changes in the lungs but no definite
metastatic lesions.
3. Single hypermetabolic bone lesion in the right femur. Recommend
surveillance.

## 2016-04-19 IMAGING — MG MM DIGITAL SCREENING
4 series · 4 of 4 positions shown · non-contrast
Comparison: Previous exam(s).

CLINICAL DATA: Screening.

EXAM:
DIGITAL SCREENING BILATERAL MAMMOGRAM WITH CAD

[R CC]
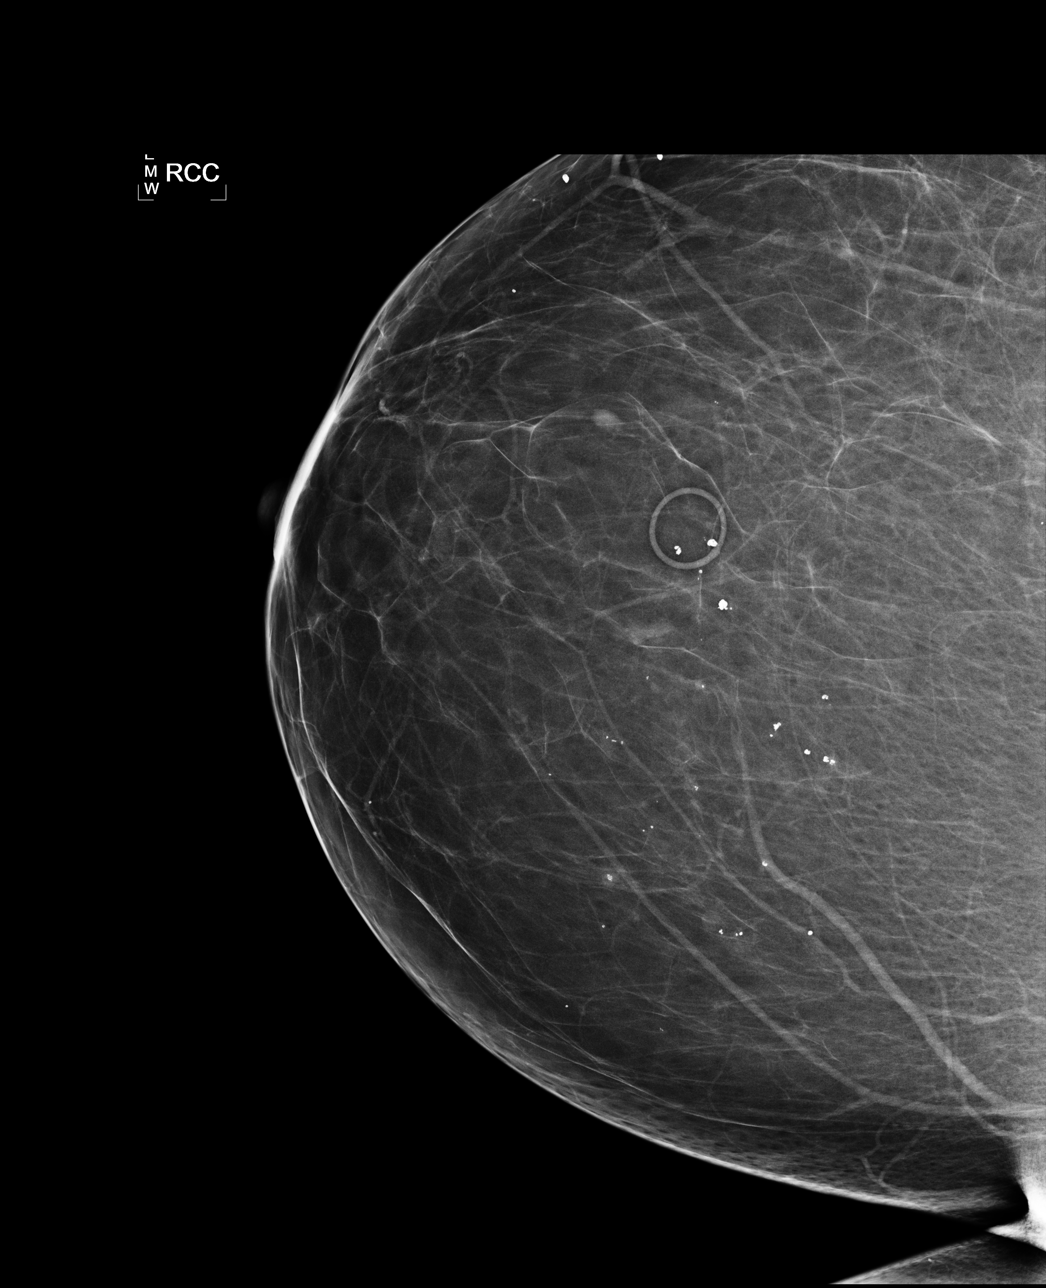

[L CC]
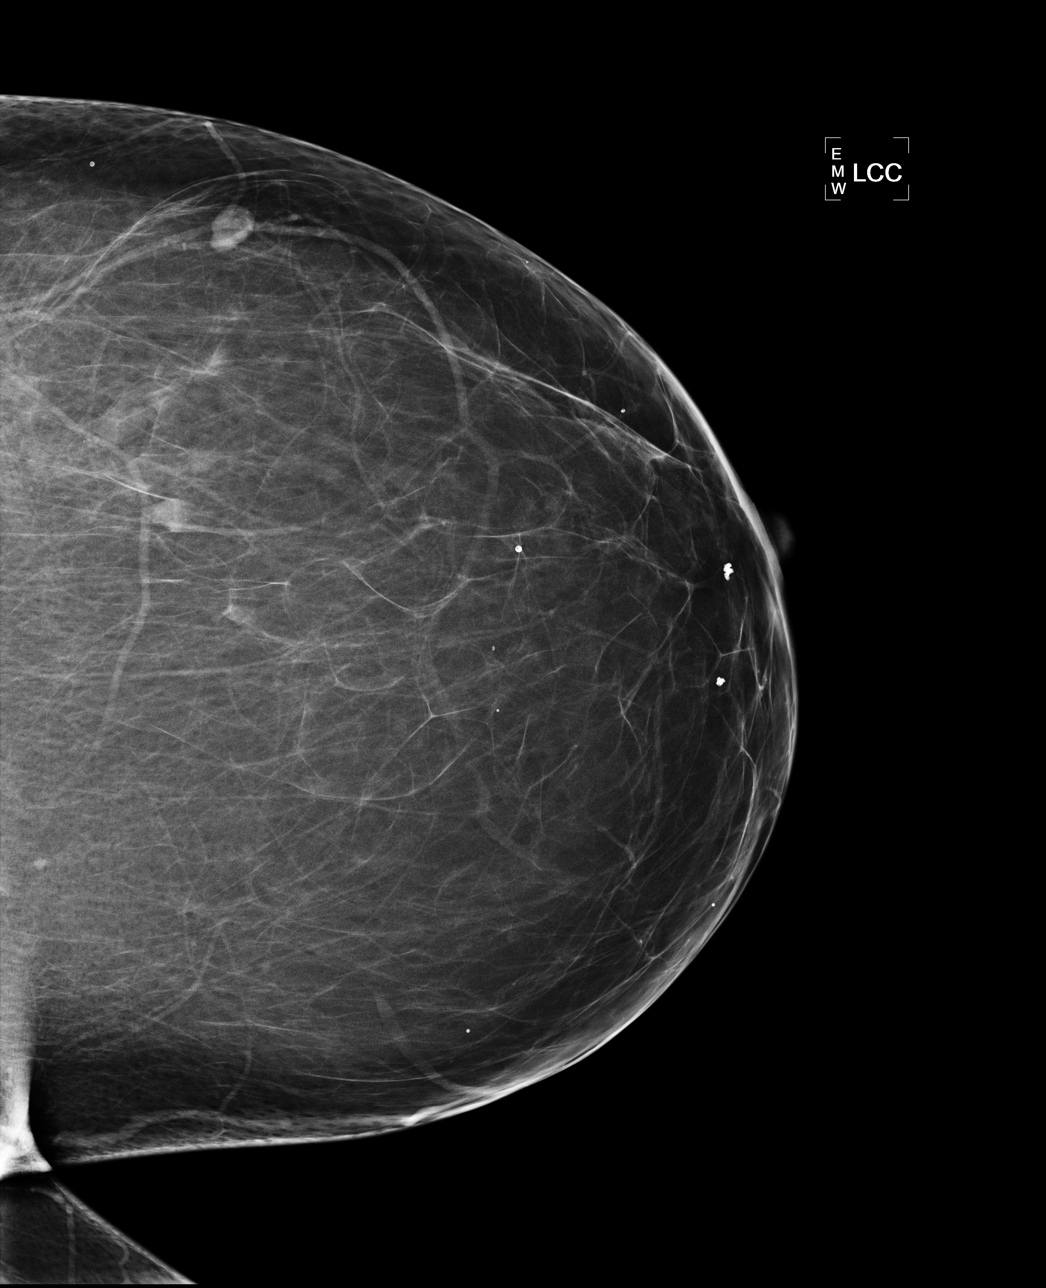

[L MLO]
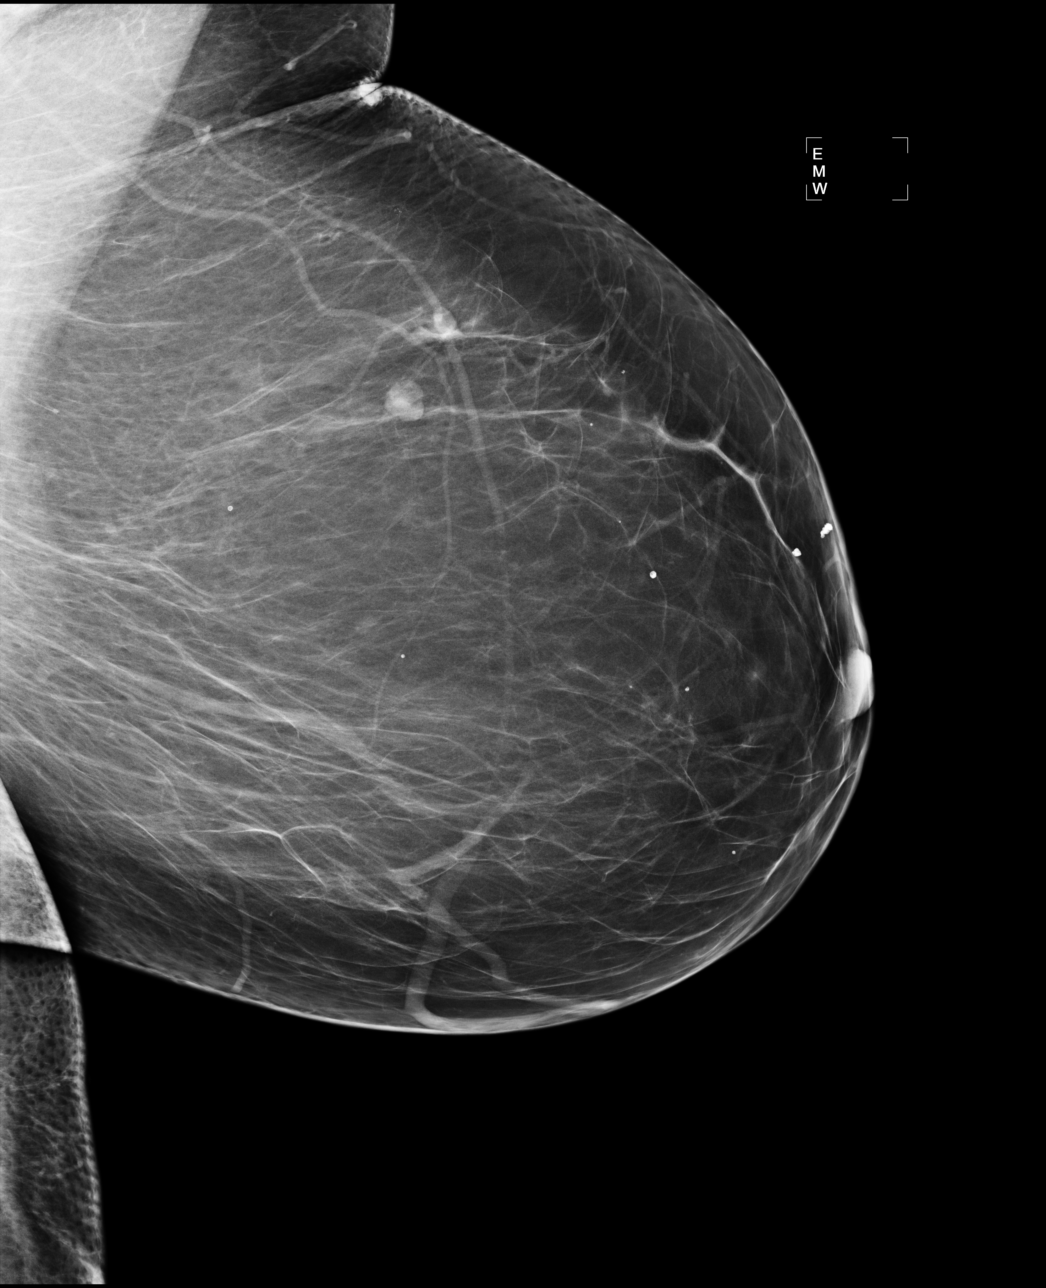

[R MLO]
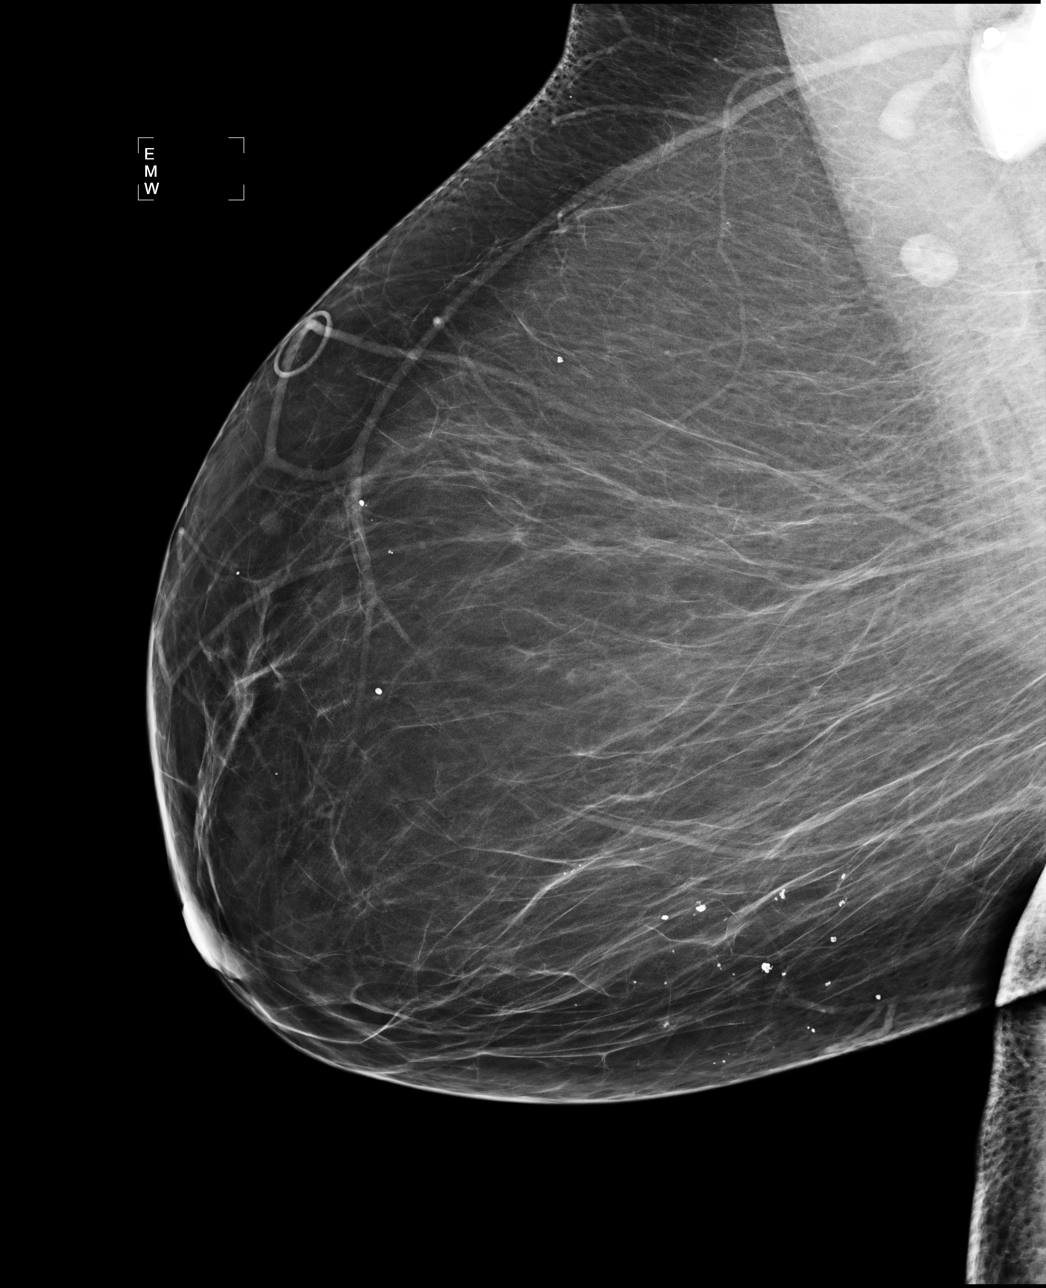

[4 of 4 positions shown; findings below may reference images not displayed]

ACR Breast Density Category b: There are scattered areas of
fibroglandular density.
FINDINGS: There are no findings suspicious for malignancy. Images were
processed with CAD.
IMPRESSION: No mammographic evidence of malignancy. A result letter of this
screening mammogram will be mailed directly to the patient.

RECOMMENDATION:
Screening mammogram in one year. (Code:AS-G-LCT)

BI-RADS CATEGORY  1: Negative.

## 2016-04-20 ENCOUNTER — Ambulatory Visit (INDEPENDENT_AMBULATORY_CARE_PROVIDER_SITE_OTHER): Payer: Medicare Other | Admitting: Family Medicine

## 2016-04-20 ENCOUNTER — Encounter: Payer: Self-pay | Admitting: Family Medicine

## 2016-04-20 VITALS — BP 150/84 | HR 76 | Temp 98.0°F | Ht 65.0 in | Wt 223.0 lb

## 2016-04-20 DIAGNOSIS — R21 Rash and other nonspecific skin eruption: Secondary | ICD-10-CM | POA: Diagnosis not present

## 2016-04-20 MED ORDER — DOXYCYCLINE HYCLATE 100 MG PO TABS: 100.0000 mg | ORAL_TABLET | Freq: Two times a day (BID) | ORAL | 0 refills | Status: DC

## 2016-04-20 NOTE — Progress Notes (Signed)
   Subjective:    Patient ID: Erika Strong, female    DOB: 03-26-1945, 71 y.o.   MRN: [id number]  HPI 71 year old female comes in complaining of a rash that's been present for about 5-6 days. She denies any fever or other systemic symptoms. She's been using calamine, Polysporin, and cortisone on it.  She says started initially it just started as a red spot in the middle. She had been out walking in the woods the day before. She doesn't remember seeing a tick bite or a spider bite at the time. But the next day she saw the spot. She said the time EMS had a ring around the outer edge of the area. It did itch slightly so she actually put the Caladryl lotion on it. Then went to work him a time she came back home the redness had spread out even further. She actually put hydrocortisone on it last night and feels like it actually looks a little better this morning.  Review of Systems     Objective:   Physical Exam  Constitutional: She is oriented to person, place, and time. She appears well-developed and well-nourished.  HENT:  Head: Normocephalic and atraumatic.  Eyes: Conjunctivae and EOM are normal.  Cardiovascular: Normal rate.   Pulmonary/Chest: Effort normal.  Neurological: She is alert and oriented to person, place, and time.  Skin: Skin is dry. No pallor.  Psychiatric: She has a normal mood and affect. Her behavior is normal.  Vitals reviewed.             Assessment & Plan:  Rash, nonvesicular-unclear etiology area did not sure if caused by a spider bite or tick bite or something else. Medical head and place her on doxycycline and have her use the hydrocortisone cream since it does seem a little better today. I want her to keep a close eye on it over the next couple days and call back if it's not improving or if it suddenly gets worse.

## 2016-04-20 NOTE — Patient Instructions (Signed)
Call if rash is not getting better each day with the hydrocortisone cream.

## 2016-06-21 ENCOUNTER — Encounter: Payer: Self-pay | Admitting: Family Medicine

## 2016-06-21 ENCOUNTER — Ambulatory Visit (INDEPENDENT_AMBULATORY_CARE_PROVIDER_SITE_OTHER): Payer: Medicare Other | Admitting: Family Medicine

## 2016-06-21 VITALS — BP 138/78 | HR 70 | Ht 65.0 in | Wt 222.0 lb

## 2016-06-21 DIAGNOSIS — C7989 Secondary malignant neoplasm of other specified sites: Secondary | ICD-10-CM

## 2016-06-21 DIAGNOSIS — S76211A Strain of adductor muscle, fascia and tendon of right thigh, initial encounter: Secondary | ICD-10-CM | POA: Diagnosis not present

## 2016-06-21 DIAGNOSIS — I10 Essential (primary) hypertension: Secondary | ICD-10-CM | POA: Diagnosis not present

## 2016-06-21 DIAGNOSIS — Z6837 Body mass index (BMI) 37.0-37.9, adult: Secondary | ICD-10-CM | POA: Diagnosis not present

## 2016-06-21 NOTE — Progress Notes (Signed)
Subjective:    CC: HTN  HPI:  Hypertension- Pt denies chest pain, SOB, dizziness, or heart palpitations.  Taking meds as directed w/o problems.  Denies medication side effects.     Metastatic cancer to pelvis - Overall she is really doing well. She continues to take the Megace. She has struggled a little bit with some weight gain on the medication as well as some increased sweating but otherwise is doing great. She recently had her follow-up with her oncologist at [facility]. Her next follow-up visit in August. She's been following every 4 months.  She wasn't able to exercise much during the winter bc of the cold months but plans on working on losing some weight.    She also thinks she may have pulled  muscle in her right inner groin. She picked up an elderly person and afterwards felt some pain in her right inner thigh. She's been using a muscle rub on it. She said it has been gradually getting a little bit better but she did not check it out today as she knows that the Megace that she takes does increase her risk of blood clots.  Past medical history, Surgical history, Family history not pertinant except as noted below, Social history, Allergies, and medications have been entered into the medical record, reviewed, and corrections made.   Review of Systems: No fevers, chills, night sweats, weight loss, chest pain, or shortness of breath.   Objective:    General: Well Developed, well nourished, and in no acute distress.  Neuro: Alert and oriented x3, extra-ocular muscles intact, sensation grossly intact.  HEENT: Normocephalic, atraumatic  Skin: Warm and dry, no rashes. Cardiac: Regular rate and rhythm, no murmurs rubs or gallops, no lower extremity edema.  Respiratory: Clear to auscultation bilaterally. Not using accessory muscles, speaking in full sentences. MSK : Right kidney and ankle strength is 5 bilaterally. Just slightly tender at the right groin crease area. Good strength with abduction  and abduction.    Impression and Recommendations:    HTN - Well controlled today. Continue current regimen. Continue to work on increasing exercise and activity level and reducing her weight. Due for CMP and lipid. As well as 11 A1c.  Metastatic cancer to pelvis - she is doing well Megace. Keep follow-up in August with oncologist at 2. We'll go ahead and check a CBC today per her request.  Right groin strain-work on gentle stretches and exercises. Can use heat or ice which one it feels better at this point in time. Continue with muscle rub. Okay to take ibuprofen. It is safe to take with the Megace.  BMI 37 - Continue to work on increasing exercise and activity level and reducing her weight.

## 2016-06-22 ENCOUNTER — Other Ambulatory Visit: Payer: Self-pay | Admitting: Family Medicine

## 2016-06-22 DIAGNOSIS — Z1239 Encounter for other screening for malignant neoplasm of breast: Secondary | ICD-10-CM

## 2016-06-22 LAB — CMP 10231
AG Ratio: 1.4 Ratio (ref 1.0–2.5)
ALBUMIN: 3.8 g/dL (ref 3.6–5.1)
ALT: 25 U/L (ref 6–29)
AST: 25 U/L (ref 10–35)
Alkaline Phosphatase: 44 U/L (ref 33–130)
BILIRUBIN TOTAL: 0.5 mg/dL (ref 0.2–1.2)
BUN/Creatinine Ratio: 13.8 Ratio (ref 6–22)
BUN: 13 mg/dL (ref 7–25)
CALCIUM: 9.5 mg/dL (ref 8.6–10.4)
CO2: 25 mmol/L (ref 20–31)
Chloride: 103 mmol/L (ref 98–110)
Creat: 0.94 mg/dL (ref 0.50–0.99)
GFR, Est African American: 72 mL/min (ref >=60)
GFR, Est Non African American: 62 mL/min (ref >=60)
GLOBULIN: 2.7 g/dL (ref 1.9–3.7)
GLUCOSE: 82 mg/dL (ref 65–99)
Potassium: 4.7 mmol/L (ref 3.5–5.3)
Sodium: 141 mmol/L (ref 135–146)
Total Protein: 6.5 g/dL (ref 6.1–8.1)

## 2016-06-22 LAB — CBC WITH DIFFERENTIAL/PLATELET
BASOS ABS: 0 {cells}/uL (ref 0–200)
Basophils Relative: 0 %
EOS PCT: 2 %
Eosinophils Absolute: 208 cells/uL (ref 15–500)
HCT: 41.1 % (ref 35.0–45.0)
HEMOGLOBIN: 13.1 g/dL (ref 11.7–15.5)
LYMPHS PCT: 37 %
Lymphs Abs: 3848 cells/uL (ref 850–3900)
MCH: 29.2 pg (ref 27.0–33.0)
MCHC: 31.9 g/dL — AB (ref 32.0–36.0)
MCV: 91.7 fL (ref 80.0–100.0)
MONOS PCT: 8 %
MPV: 10.5 fL (ref 7.5–12.5)
Monocytes Absolute: 832 cells/uL (ref 200–950)
NEUTROS PCT: 53 %
Neutro Abs: 5512 cells/uL (ref 1500–7800)
Platelets: 271 10*3/uL (ref 140–400)
RBC: 4.48 MIL/uL (ref 3.80–5.10)
RDW: 13.5 % (ref 11.0–15.0)
WBC: 10.4 10*3/uL (ref 3.8–10.8)

## 2016-06-22 LAB — LIPID PANEL W/REFLEX DIRECT LDL
Cholesterol: 190 mg/dL (ref <200)
HDL: 32 mg/dL — AB (ref >50)
LDL-CHOLESTEROL: 135 mg/dL — AB
NON-HDL CHOLESTEROL (CALC): 158 mg/dL — AB (ref <130)
TRIGLYCERIDES: 118 mg/dL (ref <150)
Total CHOL/HDL Ratio: 5.9 Ratio — ABNORMAL HIGH (ref <5.0)

## 2016-06-23 LAB — HEMOGLOBIN A1C
Hgb A1c MFr Bld: 5.2 % (ref <5.7)
Mean Plasma Glucose: 103 mg/dL

## 2016-07-12 ENCOUNTER — Ambulatory Visit (INDEPENDENT_AMBULATORY_CARE_PROVIDER_SITE_OTHER): Payer: Medicare Other

## 2016-07-12 ENCOUNTER — Ambulatory Visit (INDEPENDENT_AMBULATORY_CARE_PROVIDER_SITE_OTHER): Payer: Medicare Other | Admitting: Family Medicine

## 2016-07-12 ENCOUNTER — Other Ambulatory Visit: Payer: Self-pay | Admitting: Family Medicine

## 2016-07-12 ENCOUNTER — Encounter: Payer: Self-pay | Admitting: Family Medicine

## 2016-07-12 VITALS — BP 140/78 | HR 68 | Ht 65.0 in

## 2016-07-12 DIAGNOSIS — M25551 Pain in right hip: Secondary | ICD-10-CM

## 2016-07-12 DIAGNOSIS — I700 Atherosclerosis of aorta: Secondary | ICD-10-CM | POA: Diagnosis not present

## 2016-07-12 DIAGNOSIS — R1031 Right lower quadrant pain: Secondary | ICD-10-CM

## 2016-07-12 MED ORDER — GABAPENTIN 100 MG PO CAPS: ORAL_CAPSULE | ORAL | 3 refills | Status: DC

## 2016-07-12 NOTE — Progress Notes (Signed)
Subjective:    Patient ID: Erika Strong, female    DOB: 08/11/1945, 71 y.o.   MRN: [id number]  HPI 71 year old female comes in today complaining of right upper leg pain. She says it really started more with intermittent groin pain. She says that it's worse with sitting. She's been trying ibuprofen. It seems to help temporarily. Like it started about 5 days ago. Then she started having pain on the right anterior thigh and the inner groin and thigh pain actually improved. Then a couple days ago she had severe right knee pain but that seems to have eased off as well and now still mostly complaining of right anterior thigh pain. Worse with sitting better with standing. She's tried dry heat and moist heat on it. She denies any numbness numbness but says it does feel like pins and needles at times. She says at times it almost has a burning sensation as well.   Review of Systems   BP 140/78   Pulse 68   Ht 5\' 5"  (1.651 m)   SpO2 99%     Allergies  Allergen Reactions  . Granix [Filgrastim]     Shot on [employer]. Developed chills and fever  . Atorvastatin Other (See Comments)     myalgias  . Fish Allergy Nausea And Vomiting  . Lisinopril Other (See Comments)     Cough  . Pravastatin Sodium Other (See Comments)     myalgias  . Simvastatin Other (See Comments)     myalgias    Past Medical History:  Diagnosis Date  . Anemia due to chemotherapy 01/23/2014  . Cancer ([place])   . Diverticulitis 2010  . Endometrial cancer ([place]) 11/02/2010  . Hepatic steatosis 02/12/2014  . Hyperlipidemia    2007  . Hypertension   . Malignant neoplasm of corpus uteri, except isthmus ([place]) 01/21/2011  . Wears glasses     Past Surgical History:  Procedure Laterality Date  . ABDOMINAL HYSTERECTOMY  11/24/2010   RTLH, BSO, RPLND, LPLNS  . GONADECTOMY AND HYSTERECTOMY  11/24/2010   Endometrial cancer, performed by Dr. Janie Morning  . MOLE REMOVAL  Nov 2012    Social History   Social History  . Marital  status: Married    Spouse name: [name]  . Number of children: 1   . Years of education: Assoc degr   Occupational History  . cosmotologist/teacher   .  [place] History Main Topics  . Smoking status: Never Smoker  . Smokeless tobacco: Never Used  . Alcohol use No  . Drug use: No  . Sexual activity: No     Comment: cosmetologist, teaches, associate degree, married, regularly exercises.    Other Topics Concern  . Not on file   Social History Narrative   Walks daily for exercise.     Family History  Problem Relation Age of Onset  . Lung cancer Father        smoker  . Cancer Father   . Osteoporosis Mother   . Rheum arthritis Mother   . Cancer Mother     Outpatient Encounter Prescriptions as of 07/12/2016  Medication Sig  . acyclovir ointment (ZOVIRAX) 5 % Apply to fever blister 5 times a day  . Ascorbic Acid (VITAMIN C CR) 1000 MG TBCR Take 1,000 mg by mouth daily.  . B Complex-C (B-COMPLEX WITH VITAMIN C) tablet Take 1 tablet by mouth daily.  . chlorthalidone (HYGROTON) 50 MG tablet Take 1 tablet (50 mg total)  by mouth daily.  Marland Kitchen gabapentin (NEURONTIN) 100 MG capsule Start 100mg  po QHS x 2 days then increase to 2 tabs po QHS x 2 days then 1 tab in AM and 2 tabs at bedtime.  . megestrol (MEGACE) 40 MG tablet Take 2 tablets (80 mg total) by mouth 2 (two) times daily.  Marland Kitchen OVER THE COUNTER MEDICATION Take 1 tablet by mouth daily. Curamin  . OVER THE COUNTER MEDICATION Take 3 tablets by mouth daily. [place] Bone Strength (calcium product)  . OVER THE COUNTER MEDICATION Take 1 packet by mouth 2 (two) times daily. [place] Fleeting Vitamin Pak (contains fish oil, potassium and multi vitamins)  . OVER THE COUNTER MEDICATION Take 3 tablets by mouth 3 (three) times daily. Intestinal Sooth and Build  . OVER THE COUNTER MEDICATION Take 2 tablets by mouth 2 (two) times daily. Stress J  . OVER THE COUNTER MEDICATION Take 1 tablet by mouth daily. Liver Care  .  OVER THE COUNTER MEDICATION Take 10 mLs by mouth daily with breakfast. Liquid chlorophyl  . OVER THE COUNTER MEDICATION Take 30 mLs by mouth daily with breakfast. Flax Seed Oil  . Probiotic Product (PROBIOTIC DAILY) CAPS Take 1 capsule by mouth daily.  Marland Kitchen pyridOXINE (VITAMIN B-6) 100 MG tablet Take 100 mg by mouth daily.  . traZODone (DESYREL) 50 MG tablet Take 0.5-1 tablets (25-50 mg total) by mouth at bedtime as needed for sleep.  Marland Kitchen Ubiquinol 100 MG CAPS Take 100 mg by mouth daily with breakfast.    No facility-administered encounter medications on file as of 07/12/2016.           Objective:   Physical Exam  Constitutional: She is oriented to person, place, and time. She appears well-developed and well-nourished.  HENT:  Head: Normocephalic and atraumatic.  Eyes: Conjunctivae and EOM are normal.  Cardiovascular: Normal rate.   Pulmonary/Chest: Effort normal.  Neurological: She is alert and oriented to person, place, and time.  Skin: Skin is dry. No pallor.  Psychiatric: She has a normal mood and affect. Her behavior is normal.  Vitals reviewed.         Assessment & Plan:  Right anterior leg pain - Consider meralgia paresthetica. With her history of endometrial cancer will get an x-ray of the hip and low spine just to make sure that we don't see any bony lesions area she is always worried about this. She also has known chemotherapy induced peripheral neuropathy which certainly could be contributing. She tries to avoid wearing tight fitting pants. Recommend a trial of Abbott Pinson.

## 2016-07-13 ENCOUNTER — Telehealth: Payer: Self-pay

## 2016-07-13 NOTE — Telephone Encounter (Signed)
Spoke w/pt and advised her to take two gabapentin qhs. She stated that she hadn't taken any today and that she had only taken extra strength tylenol. She said that she would take the med at bedtime and would get on schedule with taking this medication. Also recommended that she alternate btw ice and heat. She will call tomorrow to schedule appt w/sports med.Erika Strong

## 2016-07-13 NOTE — Telephone Encounter (Signed)
No, she can even go ahead and take it 3 times a day if needed.  Let's get her in with one of our sports med docs thsi week.

## 2016-07-13 NOTE — Telephone Encounter (Signed)
Erika Strong states she was having so much pain in her leg she took a gabapentin around 4 pm and then again around 8 pm. She was to take 1 100 mg tablet at bedtime for 2 days. She wanted to know if taking the 1 tablet twice daily so soon was ok. She also wants to know if she can take tylenol along with the gabapentin. Please advise.

## 2016-07-23 ENCOUNTER — Encounter: Payer: Self-pay | Admitting: Family Medicine

## 2016-07-23 ENCOUNTER — Ambulatory Visit (INDEPENDENT_AMBULATORY_CARE_PROVIDER_SITE_OTHER): Payer: Medicare Other | Admitting: Family Medicine

## 2016-07-23 VITALS — BP 145/74 | HR 74 | Wt 222.0 lb

## 2016-07-23 DIAGNOSIS — W57XXXA Bitten or stung by nonvenomous insect and other nonvenomous arthropods, initial encounter: Secondary | ICD-10-CM | POA: Diagnosis not present

## 2016-07-23 DIAGNOSIS — R1031 Right lower quadrant pain: Secondary | ICD-10-CM

## 2016-07-23 DIAGNOSIS — M1611 Unilateral primary osteoarthritis, right hip: Secondary | ICD-10-CM | POA: Insufficient documentation

## 2016-07-23 MED ORDER — GABAPENTIN 100 MG PO CAPS: ORAL_CAPSULE | ORAL | 3 refills | Status: DC

## 2016-07-23 MED ORDER — MELOXICAM 15 MG PO TABS: ORAL_TABLET | ORAL | 3 refills | Status: DC

## 2016-07-23 NOTE — Assessment & Plan Note (Signed)
X-ray confirmed, patient is homebound. Adding meloxicam, she will avoid other NSAIDs, home health physical therapy. Next line return in one month or sooner for injection if persistent pain.  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

## 2016-07-23 NOTE — Progress Notes (Signed)
   Subjective:    Patient ID: Erika Strong, female    DOB: April 12, 1945, 71 y.o.   MRN: [id number]  HPI  R groin area pt reports that this area is sore and irritatied espescially when she is moving around.I initially saw her several weeks ago for this pain. At that time it was that she started to radiate down the anterior portion of the leg. At that time I felt like it was most consistent with meralgia paresthetica. We decided to start her on some gabapentin. X-rays did reveal some arthritis in both hips. No significant problems in the lumbar spine. She says the gabapentin actually has been working really well and the pain over the anterior thigh has almost completely resolved. Now she just getting a very deep soreness along the groin crease just near the pelvic area. It's worse with standing  Tick Removal - pt's husband noticed a tick on her waistline this morning as he was helping her into the shower. he stated that it was a dark gray brown and that he pulled it straight out w/tweezers   Review of Systems     Objective:   Physical Exam  Constitutional: She is oriented to person, place, and time. She appears well-developed and well-nourished.  HENT:  Head: Normocephalic and atraumatic.  Eyes: Conjunctivae and EOM are normal.  Cardiovascular: Normal rate.   Pulmonary/Chest: Effort normal.  Neurological: She is alert and oriented to person, place, and time.  Skin: Skin is dry. No pallor.  On her waist line on the right side she has a 1 cm erythematous papular lesion. No extending erythema. No drainage. No active bleeding.  Psychiatric: She has a normal mood and affect. Her behavior is normal.  Vitals reviewed.         Assessment & Plan:  Right ant leg pain - the anterior thigh pain  Is much better on the gabapentin. OK to add miafternoon dose PRN. She is still having the groin pain though which I think is related to her osteoarthritis. Consult with Dr. Dianah Field who evaluated her  right hip and thigh. He recommended a trial of NSAIDs and some home physical therapy for a month and then follow-up with him  Tick Removal - Area looks clean. Just a slight erythema and induration but gave reassurance.

## 2016-07-23 NOTE — Progress Notes (Signed)
   Subjective:    I'm seeing this patient as a consultation for:  Dr. Beatrice Lecher  CC: Right groin pain  HPI: This is a pleasant 71 year old female, for sometime now she's had pain that she localizes in the right groin, worse with weightbearing. She did have some paresthesias going into the anterior thigh that seemed to resolve well with gabapentin. Pain in the groin remained, moderate, persistent, no further radiation.  Past medical history:  Negative.  See flowsheet/record as well for more information.  Surgical history: Negative.  See flowsheet/record as well for more information.  Family history: Negative.  See flowsheet/record as well for more information.  Social history: Negative.  See flowsheet/record as well for more information.  Allergies, and medications have been entered into the medical record, reviewed, and no changes needed.   Review of Systems: No headache, visual changes, nausea, vomiting, diarrhea, constipation, dizziness, abdominal pain, skin rash, fevers, chills, night sweats, weight loss, swollen lymph nodes, body aches, joint swelling, muscle aches, chest pain, shortness of breath, mood changes, visual or auditory hallucinations.   Objective:   General: Well Developed, well nourished, and in no acute distress.  Neuro/Psych: Alert and oriented x3, extra-ocular muscles intact, able to move all 4 extremities, sensation grossly intact. Skin: Warm and dry, no rashes noted.  Respiratory: Not using accessory muscles, speaking in full sentences, trachea midline.  Cardiovascular: Pulses palpable, no extremity edema. Abdomen: Does not appear distended. Right Hip: ROM IR: 60 Deg, reproduction of severe pain, ER: 60 Deg, Flexion: 120 Deg, Extension: 100 Deg, Abduction: 45 Deg, Adduction: 45 Deg Strength IR: 5/5, ER: 5/5, Flexion: 5/5, Extension: 5/5, Abduction: 5/5, Adduction: 5/5 Pelvic alignment unremarkable to inspection and palpation. Standing hip rotation and  gait without trendelenburg / unsteadiness. Greater trochanter without tenderness to palpation. No tenderness over piriformis. No SI joint tenderness and normal minimal SI movement.  X-rays reviewed and show bilateral hip osteoarthritis.  Impression and Recommendations:   This case required medical decision making of moderate complexity.  Primary osteoarthritis of right hip X-ray confirmed, patient is homebound. Adding meloxicam, she will avoid other NSAIDs, home health physical therapy. Next line return in one month or sooner for injection if persistent pain.  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

## 2016-07-26 ENCOUNTER — Ambulatory Visit: Payer: Medicare Other | Admitting: Family Medicine

## 2016-07-30 ENCOUNTER — Other Ambulatory Visit: Payer: Self-pay | Admitting: Family Medicine

## 2016-08-03 ENCOUNTER — Telehealth: Payer: Self-pay | Admitting: *Deleted

## 2016-08-03 NOTE — Telephone Encounter (Signed)
Yes that's fine, go ahead and schedule.

## 2016-08-03 NOTE — Telephone Encounter (Signed)
Patient called and states she cannot do the PT because she is just too sore after doing the physical therapy . She states she asked them not to come back for now. The PT was coming to her home once a week. She states this morning she was hardly able to walk. She took the meloxicam and she took tylenol arthritis. I did tell her that she me be sore for a for a little bit until she gaines more strength. I told her that per Dr. Landry Corporal note she could follow up in a month or sooner for  Injection. Ultimitley the patient is wanting to have the hip injection done so she can proceed with PT. The patient wanted me to forward this note to Dr. Dianah Field before scheduling an appt for injection.

## 2016-08-04 NOTE — Telephone Encounter (Signed)
Patient notified. She said she will "see how it goes because today it felt better." she states she will call back if she needs to.

## 2016-08-10 ENCOUNTER — Ambulatory Visit: Payer: Medicare Other

## 2016-08-11 ENCOUNTER — Encounter: Payer: Self-pay | Admitting: Sports Medicine

## 2016-08-11 ENCOUNTER — Ambulatory Visit (INDEPENDENT_AMBULATORY_CARE_PROVIDER_SITE_OTHER): Payer: Medicare Other | Admitting: Sports Medicine

## 2016-08-11 DIAGNOSIS — M1611 Unilateral primary osteoarthritis, right hip: Secondary | ICD-10-CM

## 2016-08-11 NOTE — Assessment & Plan Note (Addendum)
Right hip joint injection as above, partial response to oral meloxicam. Return in one month, she did have some facet arthritis so if there is persistent back pain we can target the facets next.

## 2016-08-11 NOTE — Progress Notes (Signed)
  Subjective:    CC: Right hip pain  HPI: I saw Erika Strong a couple of weeks ago, we started her on meloxicam which provided good relief of her right hip pain, unfortunately she's continued to have some discomfort that she locates anteriorly in the groin and proximal anterior thigh. At this point she does desire interventional treatment, pain is moderate, persistent.  Past medical history:  Negative.  See flowsheet/record as well for more information.  Surgical history: Negative.  See flowsheet/record as well for more information.  Family history: Negative.  See flowsheet/record as well for more information.  Social history: Negative.  See flowsheet/record as well for more information.  Allergies, and medications have been entered into the medical record, reviewed, and no changes needed.   Review of Systems: No fevers, chills, night sweats, weight loss, chest pain, or shortness of breath.   Objective:    General: Well Developed, well nourished, and in no acute distress.  Neuro: Alert and oriented x3, extra-ocular muscles intact, sensation grossly intact.  HEENT: Normocephalic, atraumatic, pupils equal round reactive to light, neck supple, no masses, no lymphadenopathy, thyroid nonpalpable.  Skin: Warm and dry, no rashes. Cardiac: Regular rate and rhythm, no murmurs rubs or gallops, no lower extremity edema.  Respiratory: Clear to auscultation bilaterally. Not using accessory muscles, speaking in full sentences. Right Hip: ROM IR: 20 with reproduction of concordant pain, ER: 60 Deg, Flexion: 120 Deg, Extension: 100 Deg, Abduction: 45 Deg, Adduction: 45 Deg Strength IR: 5/5, ER: 5/5, Flexion: 5/5, Extension: 5/5, Abduction: 5/5, Adduction: 5/5 Pelvic alignment unremarkable to inspection and palpation. Standing hip rotation and gait without trendelenburg / unsteadiness. Greater trochanter without tenderness to palpation. No tenderness over piriformis. No SI joint tenderness and normal  minimal SI movement.  Procedure: Real-time Ultrasound Guided Injection of right hip joint Device: GE Logiq E  Verbal informed consent obtained.  Time-out conducted.  Noted no overlying erythema, induration, or other signs of local infection.  Skin prepped in a sterile fashion.  Local anesthesia: Topical Ethyl chloride.  With sterile technique and under real time ultrasound guidance:  22-gauge spinal needle advanced to the femoral head/neck junction, I contacted bone and then injected 1 mL kenalog 40, 2 mL lidocaine, 2 mL bupivacaine. Completed without difficulty  Pain immediately resolved suggesting accurate placement of the medication.  Advised to call if fevers/chills, erythema, induration, drainage, or persistent bleeding.  Images permanently stored and available for review in the ultrasound unit.  Impression: Technically successful ultrasound guided injection.  Impression and Recommendations:    Primary osteoarthritis of right hip Right hip joint injection as above, partial response to oral meloxicam. Return in one month, she did have some facet arthritis so if there is persistent back pain we can target the facets next.

## 2016-08-17 ENCOUNTER — Ambulatory Visit (INDEPENDENT_AMBULATORY_CARE_PROVIDER_SITE_OTHER): Payer: Medicare Other

## 2016-08-17 DIAGNOSIS — Z1239 Encounter for other screening for malignant neoplasm of breast: Secondary | ICD-10-CM

## 2016-08-17 DIAGNOSIS — Z1231 Encounter for screening mammogram for malignant neoplasm of breast: Secondary | ICD-10-CM

## 2016-08-19 ENCOUNTER — Ambulatory Visit (INDEPENDENT_AMBULATORY_CARE_PROVIDER_SITE_OTHER): Payer: Medicare Other | Admitting: Sports Medicine

## 2016-08-19 DIAGNOSIS — M1611 Unilateral primary osteoarthritis, right hip: Secondary | ICD-10-CM

## 2016-08-19 DIAGNOSIS — M47816 Spondylosis without myelopathy or radiculopathy, lumbar region: Secondary | ICD-10-CM | POA: Diagnosis not present

## 2016-08-19 NOTE — Progress Notes (Signed)
   Subjective:    I'm seeing this patient as a consultation for:  Dr. Beatrice Lecher  CC: Left low back pain  HPI: This is a pleasant 71 year old female, I saw her a month ago for right hip pain, we narrow this down to the hip joint which was injected and she returns completely pain free with regards to her right hip. Unfortunately all along she's had pain that she localizes in the left side of her low back, worse with standing, moderate, persistent with gelling, nothing radicular. She did have a CT scan back in 2015 that showed moderate lower lumbar facet arthritis. No neurogenic claudication.  Past medical history:  Negative.  See flowsheet/record as well for more information.  Surgical history: Negative.  See flowsheet/record as well for more information.  Family history: Negative.  See flowsheet/record as well for more information.  Social history: Negative.  See flowsheet/record as well for more information.  Allergies, and medications have been entered into the medical record, reviewed, and no changes needed.   Review of Systems: No headache, visual changes, nausea, vomiting, diarrhea, constipation, dizziness, abdominal pain, skin rash, fevers, chills, night sweats, weight loss, swollen lymph nodes, body aches, joint swelling, muscle aches, chest pain, shortness of breath, mood changes, visual or auditory hallucinations.   Objective:   General: Well Developed, well nourished, and in no acute distress.  Neuro/Psych: Alert and oriented x3, extra-ocular muscles intact, able to move all 4 extremities, sensation grossly intact. Skin: Warm and dry, no rashes noted.  Respiratory: Not using accessory muscles, speaking in full sentences, trachea midline.  Cardiovascular: Pulses palpable, no extremity edema. Abdomen: Does not appear distended. Left Hip: ROM IR: 60 Deg, ER: 60 Deg, Flexion: 120 Deg, Extension: 100 Deg, Abduction: 45 Deg, Adduction: 45 Deg Strength IR: 5/5, ER: 5/5,  Flexion: 5/5, Extension: 5/5, Abduction: 5/5, Adduction: 5/5 Pelvic alignment unremarkable to inspection and palpation. Standing hip rotation and gait without trendelenburg / unsteadiness. Greater trochanter without tenderness to palpation. No tenderness over piriformis. No SI joint tenderness and normal minimal SI movement. Back Exam:  Inspection: Unremarkable  Motion: Flexion 45 deg, Extension 45 deg, Side Bending to 45 deg bilaterally,  Rotation to 45 deg bilaterally  SLR laying: Negative  XSLR laying: Negative  Palpable tenderness: None. FABER: negative. Sensory change: Gross sensation intact to all lumbar and sacral dermatomes.  Reflexes: 2+ at both patellar tendons, 2+ at achilles tendons, Babinski's downgoing.  Strength at foot  Plantar-flexion: 5/5 Dorsi-flexion: 5/5 Eversion: 5/5 Inversion: 5/5  Leg strength  Quad: 5/5 Hamstring: 5/5 Hip flexor: 5/5 Hip abductors: 5/5  Gait unremarkable.  Impression and Recommendations:   This case required medical decision making of moderate complexity.  Primary osteoarthritis of right hip Right hip is now pain-free after injection.  Lumbar spondylosis Persistent axial facetogenic pain on the left, we are going to proceed with a left L3-4, L4-5, L5-S1 facet joint injections. This is based on a 2015 CT scan of the abdomen and pelvis. Ultimately this fails we will proceed with a new lumbar spine MRI. There is likely some degree of spinal stenosis as well.

## 2016-08-19 NOTE — Assessment & Plan Note (Signed)
Right hip is now pain-free after injection.

## 2016-08-19 NOTE — Assessment & Plan Note (Addendum)
Persistent axial facetogenic pain on the left, we are going to proceed with a left L3-4, L4-5, L5-S1 facet joint injections. This is based on a 2015 CT scan of the abdomen and pelvis. Ultimately this fails we will proceed with a new lumbar spine MRI. There is likely some degree of spinal stenosis as well.

## 2016-08-20 ENCOUNTER — Telehealth: Payer: Self-pay

## 2016-08-20 DIAGNOSIS — M47816 Spondylosis without myelopathy or radiculopathy, lumbar region: Secondary | ICD-10-CM

## 2016-08-20 NOTE — Telephone Encounter (Signed)
Pt reports that she has her appointment (08/30/16) for her facet injection(s).  She said they will not see her unless she has a current CT or MRI of the lower lumbar spine. Please advise. -EH/RMA

## 2016-08-20 NOTE — Telephone Encounter (Signed)
In that case I will order a new MRI.

## 2016-08-23 NOTE — Telephone Encounter (Signed)
Information left on vm -EH/RMA

## 2016-08-30 ENCOUNTER — Other Ambulatory Visit: Payer: Medicare Other

## 2016-09-01 ENCOUNTER — Other Ambulatory Visit: Payer: Medicare Other

## 2016-09-08 ENCOUNTER — Ambulatory Visit: Payer: Medicare Other | Admitting: Sports Medicine

## 2016-09-16 ENCOUNTER — Ambulatory Visit: Payer: Medicare Other | Admitting: Sports Medicine

## 2016-09-17 ENCOUNTER — Telehealth: Payer: Self-pay

## 2016-09-17 NOTE — Telephone Encounter (Signed)
7/27 @ 807 pt called to apologize for not coming to appt / she was in chemo.  She also stated that the hip pain was due to her cancer and was handled by her oncologist.   No need to call pt back, just an [place]

## 2016-10-22 DIAGNOSIS — F32A Depression, unspecified: Secondary | ICD-10-CM

## 2016-10-22 DIAGNOSIS — F329 Major depressive disorder, single episode, unspecified: Secondary | ICD-10-CM | POA: Insufficient documentation

## 2016-10-22 HISTORY — DX: Depression, unspecified: F32.A

## 2016-10-27 ENCOUNTER — Other Ambulatory Visit: Payer: Self-pay | Admitting: Family Medicine

## 2016-10-29 ENCOUNTER — Encounter: Payer: Self-pay | Admitting: Family Medicine

## 2016-10-29 ENCOUNTER — Ambulatory Visit (INDEPENDENT_AMBULATORY_CARE_PROVIDER_SITE_OTHER): Payer: Medicare Other | Admitting: Family Medicine

## 2016-10-29 VITALS — BP 141/72 | HR 90 | Wt 205.0 lb

## 2016-10-29 DIAGNOSIS — R630 Anorexia: Secondary | ICD-10-CM | POA: Diagnosis not present

## 2016-10-29 DIAGNOSIS — E876 Hypokalemia: Secondary | ICD-10-CM | POA: Diagnosis not present

## 2016-10-29 DIAGNOSIS — I824Y2 Acute embolism and thrombosis of unspecified deep veins of left proximal lower extremity: Secondary | ICD-10-CM | POA: Diagnosis not present

## 2016-10-29 DIAGNOSIS — Z86718 Personal history of other venous thrombosis and embolism: Secondary | ICD-10-CM | POA: Insufficient documentation

## 2016-10-29 DIAGNOSIS — C541 Malignant neoplasm of endometrium: Secondary | ICD-10-CM | POA: Insufficient documentation

## 2016-10-29 DIAGNOSIS — I2699 Other pulmonary embolism without acute cor pulmonale: Secondary | ICD-10-CM

## 2016-10-29 DIAGNOSIS — Z86711 Personal history of pulmonary embolism: Secondary | ICD-10-CM | POA: Insufficient documentation

## 2016-10-29 NOTE — Progress Notes (Signed)
Subjective:    Patient ID: Erika Strong, female    DOB: [birth year], 71 y.o.   MRN: [id number]  HPI 71 year old female with a history of metastatic endometrial cancer went to the emergency department on August 31 for shortness of breath. She was not experiencing chest pain at the time. She had just had a radiation treatment 3 weeks prior. CT of the chest revealed bilateral lower lobe pulmonary emboli and left proximal vein emboli as well. She was started on xarelto.  ECHO obtained revealed EF 93-79%, grade I diastolic dysfunction, mildly dilated RA and moderately dilated RV. Also trace TR but no evidence of strain.  She is here for one week follow-up.    She was able to get the Xarelto and has been taking 15mg  BID.  Has the starter pack . Doing well on the medication overall but feeling dizzy. Says it is coming and goin during the day. Seems better during the evening.  Says she was told is could be the PE causing this. She feels like her breathing is at baseline.   She is using a walker today bc she was told to be careful to not injure her hip where she had radiation. She doesn' want to fall with her dizziness.    Review of Systems  BP (!) 141/72   Pulse 90   Wt 205 lb (93 kg)   SpO2 98%   BMI 34.11 kg/m     Allergies  Allergen Reactions  . Granix [Filgrastim]     Shot on [employer]. Developed chills and fever  . Atorvastatin Other (See Comments)     myalgias  . Fish Allergy Nausea And Vomiting  . Lisinopril Other (See Comments)     Cough  . Pravastatin Sodium Other (See Comments)     myalgias  . Simvastatin Other (See Comments)     myalgias    Past Medical History:  Diagnosis Date  . Anemia due to chemotherapy 01/23/2014  . Cancer ([place])   . Diverticulitis 2010  . Endometrial cancer ([place]) 11/02/2010  . Hepatic steatosis 02/12/2014  . Hyperlipidemia    2007  . Hypertension   . Malignant neoplasm of corpus uteri, except isthmus ([place]) 01/21/2011  . Wears glasses     Past  Surgical History:  Procedure Laterality Date  . ABDOMINAL HYSTERECTOMY  11/24/2010   RTLH, BSO, RPLND, LPLNS  . GONADECTOMY AND HYSTERECTOMY  11/24/2010   Endometrial cancer, performed by Dr. Janie Morning  . MOLE REMOVAL  Nov 2012    Social History   Social History  . Marital status: Married    Spouse name: [name]  . Number of children: 1   . Years of education: Assoc degr   Occupational History  . cosmotologist/teacher   .  [place] History Main Topics  . Smoking status: Never Smoker  . Smokeless tobacco: Never Used  . Alcohol use No  . Drug use: No  . Sexual activity: No     Comment: cosmetologist, teaches, associate degree, married, regularly exercises.    Other Topics Concern  . Not on file   Social History Narrative   Walks daily for exercise.     Family History  Problem Relation Age of Onset  . Lung cancer Father        smoker  . Cancer Father   . Osteoporosis Mother   . Rheum arthritis Mother   . Cancer Mother     Outpatient Encounter Prescriptions as of 10/29/2016  Medication Sig  . acyclovir ointment (ZOVIRAX) 5 % Apply to fever blister 5 times a day  . Ascorbic Acid (VITAMIN C CR) 1000 MG TBCR Take 1,000 mg by mouth daily.  . B Complex-C (B-COMPLEX WITH VITAMIN C) tablet Take 1 tablet by mouth daily.  . chlorthalidone (HYGROTON) 50 MG tablet TAKE 1 TABLET(50 MG) BY MOUTH DAILY  . gabapentin (NEURONTIN) 100 MG capsule Take 1 tab in AM, 1 mid-afternoon, and 2 at bedtime.  Marland Kitchen letrozole (FEMARA) 2.5 MG tablet Take 2.5 mg by mouth.  Marland Kitchen OVER THE COUNTER MEDICATION Take 1 tablet by mouth daily. Curamin  . OVER THE COUNTER MEDICATION Take 3 tablets by mouth daily. New Chapter Bone Strength (calcium product)  . OVER THE COUNTER MEDICATION Take 1 packet by mouth 2 (two) times daily. Preston Fleeting Vitamin Pak (contains fish oil, potassium and multi vitamins)  . OVER THE COUNTER MEDICATION Take 3 tablets by mouth 3 (three) times daily.  Intestinal Sooth and Build  . OVER THE COUNTER MEDICATION Take 2 tablets by mouth 2 (two) times daily. Stress J  . OVER THE COUNTER MEDICATION Take 1 tablet by mouth daily. Liver Care  . OVER THE COUNTER MEDICATION Take 10 mLs by mouth daily with breakfast. Liquid chlorophyl  . OVER THE COUNTER MEDICATION Take 30 mLs by mouth daily with breakfast. Flax Seed Oil  . Probiotic Product (PROBIOTIC DAILY) CAPS Take 1 capsule by mouth daily.  Marland Kitchen pyridOXINE (VITAMIN B-6) 100 MG tablet Take 100 mg by mouth daily.  . rivaroxaban (XARELTO) 20 MG TABS tablet Take 20 mg by mouth.  . traZODone (DESYREL) 50 MG tablet Take 0.5-1 tablets (25-50 mg total) by mouth at bedtime as needed for sleep.  Marland Kitchen Ubiquinol 100 MG CAPS Take 100 mg by mouth daily with breakfast.   . megestrol (MEGACE) 40 MG tablet Take 2 tablets (80 mg total) by mouth 2 (two) times daily. (Patient not taking: Reported on 10/29/2016)  . meloxicam (MOBIC) 15 MG tablet One tab PO qAM with breakfast for 2 weeks, then daily prn pain. (Patient not taking: Reported on 10/29/2016)   No facility-administered encounter medications on file as of 10/29/2016.          Objective:   Physical Exam  Constitutional: She is oriented to person, place, and time. She appears well-developed and well-nourished.  HENT:  Head: Normocephalic and atraumatic.  Cardiovascular: Normal rate, regular rhythm and normal heart sounds.   Pulmonary/Chest: Effort normal and breath sounds normal.  Neurological: She is alert and oriented to person, place, and time.  Skin: Skin is warm and dry.  Psychiatric: She has a normal mood and affect. Her behavior is normal.       Assessment & Plan:  Bilat Pulmonary emboli with left leg DVT - continue with Xarelto. Will change to 20mg  QD in about a week. Reassured her safe to eat greenbeans and green veggies.    Hypokalemia - recheck to make sure stable.    Recurrent endometrial adenocarcinoma - undergong radiation treatment at [facility]     Dizziness - could be result of bilat PE vs medications S.E. She will call me if still dizzy in 2 weeks.  Xarelto can cause dizziness and nausea.    Dec appetite - will mnoitor. She is trying to eat some and is no longer feeling nauseated.     Echo 10/23/2016:  Interpretation Summary A complete portable two-dimensional transthoracic echocardiogram with color flow Doppler and Spectral Doppler was performed. The left ventricular ejection fraction  is normal (65-70%). There is mild concentric left ventricular hypertrophy. The aortic valve is trileaflet. Grade I mild diastolic dysfunction; abnormal relaxation pattern. The right ventricle is moderately dilated. The left ventricle is grossly normal size. The left ventricular apex is not well visualized. The left ventricular wall motion is normal. The right atrium is mildly dilated.  CT Angio Pulmonary8/31/2018:  [facility] Result Impression  IMPRESSION: Bilateral pulmonary emboli.  Electronically Signed by: Ruta Hinds  Result Narrative  INDICATION: chest pain, increased d dimer. COMPARISON:None.  TECHNIQUE:Routine CT pulmonary angiogram with contrast was performed using Contrast:90 mL mL of Isovue 370.Multiplanar 2D and angiographic 3D MIP images were constructed and reviewed. Radiation dose reduction was utilized (automated exposure  control, mA or kV adjustment based on patient size, or iterative image reconstruction).  Contrast bolus quality:satisfactory  FINDINGS: Examination is positive for bilateral lower lobe pulmonary emboli. No evidence for aortic dissection. Trachea and mainstem bronchi are patent the lungs are clear. No adenopathy. Aortic atherosclerosis. Normal heart size. No pleural or  pericardial effusion. Fatty infiltration of the liver.

## 2016-10-30 LAB — BASIC METABOLIC PANEL WITH GFR
BUN: 10 mg/dL (ref 7–25)
CO2: 33 mmol/L — AB (ref 20–32)
CREATININE: 0.62 mg/dL (ref 0.50–0.99)
Calcium: 9.5 mg/dL (ref 8.6–10.4)
Chloride: 94 mmol/L — ABNORMAL LOW (ref 98–110)
GFR, Est African American: 107 mL/min/{1.73_m2} (ref >=60)
GFR, Est Non African American: 92 mL/min/{1.73_m2} (ref >=60)
Glucose, Bld: 87 mg/dL (ref 65–99)
Potassium: 3.4 mmol/L — ABNORMAL LOW (ref 3.5–5.3)
SODIUM: 135 mmol/L (ref 135–146)

## 2016-11-01 ENCOUNTER — Other Ambulatory Visit: Payer: Self-pay | Admitting: Family Medicine

## 2016-11-01 MED ORDER — POTASSIUM CHLORIDE ER 10 MEQ PO CPCR: 10.0000 meq | ORAL_CAPSULE | Freq: Every day | ORAL | 2 refills | Status: DC

## 2016-11-01 NOTE — Progress Notes (Signed)
.  pta

## 2016-11-15 ENCOUNTER — Encounter: Payer: Self-pay | Admitting: Family Medicine

## 2016-11-15 ENCOUNTER — Ambulatory Visit (INDEPENDENT_AMBULATORY_CARE_PROVIDER_SITE_OTHER): Payer: Medicare Other | Admitting: Family Medicine

## 2016-11-15 VITALS — BP 124/61 | HR 97 | Ht 65.0 in | Wt 207.0 lb

## 2016-11-15 DIAGNOSIS — I2699 Other pulmonary embolism without acute cor pulmonale: Secondary | ICD-10-CM

## 2016-11-15 DIAGNOSIS — R42 Dizziness and giddiness: Secondary | ICD-10-CM | POA: Diagnosis not present

## 2016-11-15 DIAGNOSIS — R002 Palpitations: Secondary | ICD-10-CM

## 2016-11-15 DIAGNOSIS — E876 Hypokalemia: Secondary | ICD-10-CM | POA: Diagnosis not present

## 2016-11-15 DIAGNOSIS — R630 Anorexia: Secondary | ICD-10-CM

## 2016-11-15 MED ORDER — RIVAROXABAN 20 MG PO TABS: 20.0000 mg | ORAL_TABLET | Freq: Every day | ORAL | 2 refills | Status: DC

## 2016-11-15 NOTE — Progress Notes (Signed)
Subjective:    Patient ID: Erika Strong, female    DOB: Jul 21, 1945, 71 y.o.   MRN: [id number]  HPI Follow-up bilateral pulmonary emboli-she switched her Xarelto 20 mg once a day  Yesterday.  She was expressing some persistent dizziness that occurred after the pulmonary emboli.  She stil has been having that intermittantly.   He has been getting a fluttering sensation in her chest. An episodes where she feels sweaty. Has recorded BP at home.  SBP 109-152/70-73 with pulse in the 60-90s.     Her potassium was borderline again so we decided to put her on low-dose potassium and recheck it again today.  She admits she hasn't been moving around a lot. She's been lying around in bed. Early because the dizziness and her fear of falling and fracturing her hip after receiving radiation to that hip for tumor. She was warned that a fracture could be devastating for her.   Review of Systems  BP 124/61   Pulse 97   Ht 5\' 5"  (1.651 m)   Wt 207 lb (93.9 kg)   SpO2 100%   BMI 34.45 kg/m     Allergies  Allergen Reactions  . Granix [Filgrastim]     Shot on [employer]. Developed chills and fever  . Atorvastatin Other (See Comments)     myalgias  . Fish Allergy Nausea And Vomiting  . Lisinopril Other (See Comments)     Cough  . Pravastatin Sodium Other (See Comments)     myalgias  . Simvastatin Other (See Comments)     myalgias    Past Medical History:  Diagnosis Date  . Anemia due to chemotherapy 01/23/2014  . Cancer ([place])   . Diverticulitis 2010  . Endometrial cancer ([place]) 11/02/2010  . Hepatic steatosis 02/12/2014  . Hyperlipidemia    2007  . Hypertension   . Malignant neoplasm of corpus uteri, except isthmus ([place]) 01/21/2011  . Wears glasses     Past Surgical History:  Procedure Laterality Date  . ABDOMINAL HYSTERECTOMY  11/24/2010   RTLH, BSO, RPLND, LPLNS  . GONADECTOMY AND HYSTERECTOMY  11/24/2010   Endometrial cancer, performed by Dr. Janie Morning  . MOLE REMOVAL  Nov 2012     Social History   Social History  . Marital status: Married    Spouse name: [name]  . Number of children: 1   . Years of education: Assoc degr   Occupational History  . cosmotologist/teacher   .  [place] History Main Topics  . Smoking status: Never Smoker  . Smokeless tobacco: Never Used  . Alcohol use No  . Drug use: No  . Sexual activity: No     Comment: cosmetologist, teaches, associate degree, married, regularly exercises.    Other Topics Concern  . Not on file   Social History Narrative   Walks daily for exercise.     Family History  Problem Relation Age of Onset  . Lung cancer Father        smoker  . Cancer Father   . Osteoporosis Mother   . Rheum arthritis Mother   . Cancer Mother     Outpatient Encounter Prescriptions as of 11/15/2016  Medication Sig  . acyclovir ointment (ZOVIRAX) 5 % Apply to fever blister 5 times a day  . Ascorbic Acid (VITAMIN C CR) 1000 MG TBCR Take 1,000 mg by mouth daily.  . B Complex-C (B-COMPLEX WITH VITAMIN C) tablet Take 1 tablet by mouth daily.  Marland Kitchen  chlorthalidone (HYGROTON) 50 MG tablet TAKE 1 TABLET(50 MG) BY MOUTH DAILY  . letrozole (FEMARA) 2.5 MG tablet Take 2.5 mg by mouth.  Marland Kitchen OVER THE COUNTER MEDICATION Take 1 tablet by mouth daily. Curamin  . OVER THE COUNTER MEDICATION Take 3 tablets by mouth daily. New Chapter Bone Strength (calcium product)  . OVER THE COUNTER MEDICATION Take 1 packet by mouth 2 (two) times daily. Preston Fleeting Vitamin Pak (contains fish oil, potassium and multi vitamins)  . OVER THE COUNTER MEDICATION Take 3 tablets by mouth 3 (three) times daily. Intestinal Sooth and Build  . OVER THE COUNTER MEDICATION Take 2 tablets by mouth 2 (two) times daily. Stress J  . OVER THE COUNTER MEDICATION Take 1 tablet by mouth daily. Liver Care  . OVER THE COUNTER MEDICATION Take 10 mLs by mouth daily with breakfast. Liquid chlorophyl  . OVER THE COUNTER MEDICATION Take 30 mLs by mouth  daily with breakfast. Flax Seed Oil  . potassium chloride (MICRO-K) 10 MEQ CR capsule Take 1 capsule (10 mEq total) by mouth daily.  . Probiotic Product (PROBIOTIC DAILY) CAPS Take 1 capsule by mouth daily.  Marland Kitchen pyridOXINE (VITAMIN B-6) 100 MG tablet Take 100 mg by mouth daily.  . rivaroxaban (XARELTO) 20 MG TABS tablet Take 1 tablet (20 mg total) by mouth daily with supper.  . traZODone (DESYREL) 50 MG tablet Take 0.5-1 tablets (25-50 mg total) by mouth at bedtime as needed for sleep.  Marland Kitchen Ubiquinol 100 MG CAPS Take 100 mg by mouth daily with breakfast.   . [DISCONTINUED] rivaroxaban (XARELTO) 20 MG TABS tablet Take by mouth.  . gabapentin (NEURONTIN) 100 MG capsule Take 1 tab in AM, 1 mid-afternoon, and 2 at bedtime. (Patient not taking: Reported on 11/15/2016)  . megestrol (MEGACE) 40 MG tablet Take 2 tablets (80 mg total) by mouth 2 (two) times daily. (Patient not taking: Reported on 11/15/2016)  . [DISCONTINUED] rivaroxaban (XARELTO) 20 MG TABS tablet Take 20 mg by mouth.   No facility-administered encounter medications on file as of 11/15/2016.          Objective:   Physical Exam  Constitutional: She is oriented to person, place, and time. She appears well-developed and well-nourished.  HENT:  Head: Normocephalic and atraumatic.  Cardiovascular: Normal rate, regular rhythm and normal heart sounds.   Pulmonary/Chest: Effort normal and breath sounds normal.  Neurological: She is alert and oriented to person, place, and time.  Skin: Skin is warm and dry.  Psychiatric: She has a normal mood and affect. Her behavior is normal.          Assessment & Plan:  Bilateral pulmonary emboli-Now on Xarelto 20 mg daily and feeling a little bit better today.  Dizziness - still some intermitant dizziness we could certainly consider switching to Eliquis instead. It's difficult this point is that the dizziness is related to the pulmonary emboli or related to medication side effect. She also hasn't  been very mobile and has been lying in bed a lot so we discussed getting up and moving around and really taking some deep breaths throughout the day to help open up the lung tissue.  Decreased appetite- improved.    Palpitations-we'll check for anemia as well as thyroid disorder since she's been on the blood thinner for about 3 weeks now. Also recheck electrolytes.  Hypokalemia - due to recheck potassium levels today.  Her husband is with her today for the office visit.

## 2016-11-16 LAB — CBC
HCT: 45.1 % — ABNORMAL HIGH (ref 35.0–45.0)
HEMOGLOBIN: 14.9 g/dL (ref 11.7–15.5)
MCH: 28.7 pg (ref 27.0–33.0)
MCHC: 33 g/dL (ref 32.0–36.0)
MCV: 86.7 fL (ref 80.0–100.0)
MPV: 10.4 fL (ref 7.5–12.5)
Platelets: 311 10*3/uL (ref 140–400)
RBC: 5.2 10*6/uL — AB (ref 3.80–5.10)
RDW: 14.1 % (ref 11.0–15.0)
WBC: 10.6 10*3/uL (ref 3.8–10.8)

## 2016-11-16 LAB — BASIC METABOLIC PANEL WITH GFR
BUN: 10 mg/dL (ref 7–25)
CALCIUM: 9.5 mg/dL (ref 8.6–10.4)
CHLORIDE: 95 mmol/L — AB (ref 98–110)
CO2: 32 mmol/L (ref 20–32)
CREATININE: 0.63 mg/dL (ref 0.50–0.99)
GFR, Est African American: 106 mL/min/{1.73_m2} (ref >=60)
GFR, Est Non African American: 92 mL/min/{1.73_m2} (ref >=60)
GLUCOSE: 102 mg/dL — AB (ref 65–99)
Potassium: 3.5 mmol/L (ref 3.5–5.3)
Sodium: 134 mmol/L — ABNORMAL LOW (ref 135–146)

## 2016-11-16 LAB — TSH: TSH: 1.84 m[IU]/L (ref 0.40–4.50)

## 2016-11-26 LAB — BASIC METABOLIC PANEL: CREATININE: 0.7 (ref 0.5–1.1)

## 2016-11-26 LAB — TSH: TSH: 1.39 (ref 0.41–5.90)

## 2016-11-26 LAB — CBC AND DIFFERENTIAL
HEMATOCRIT: 42 (ref 36–46)
HEMOGLOBIN: 13.8 (ref 12.0–16.0)
PLATELETS: 257 (ref 150–399)
WBC: 9

## 2016-12-21 ENCOUNTER — Ambulatory Visit (INDEPENDENT_AMBULATORY_CARE_PROVIDER_SITE_OTHER): Payer: Medicare Other | Admitting: Family Medicine

## 2016-12-21 ENCOUNTER — Encounter: Payer: Self-pay | Admitting: Family Medicine

## 2016-12-21 VITALS — BP 136/64 | HR 66 | Ht 65.0 in | Wt 212.0 lb

## 2016-12-21 DIAGNOSIS — Z23 Encounter for immunization: Secondary | ICD-10-CM | POA: Diagnosis not present

## 2016-12-21 DIAGNOSIS — R002 Palpitations: Secondary | ICD-10-CM

## 2016-12-21 DIAGNOSIS — R42 Dizziness and giddiness: Secondary | ICD-10-CM | POA: Diagnosis not present

## 2016-12-21 DIAGNOSIS — I10 Essential (primary) hypertension: Secondary | ICD-10-CM | POA: Diagnosis not present

## 2016-12-21 DIAGNOSIS — I2699 Other pulmonary embolism without acute cor pulmonale: Secondary | ICD-10-CM

## 2016-12-21 DIAGNOSIS — E876 Hypokalemia: Secondary | ICD-10-CM | POA: Diagnosis not present

## 2016-12-21 LAB — BASIC METABOLIC PANEL WITH GFR
BUN: 10 mg/dL (ref 7–25)
CHLORIDE: 95 mmol/L — AB (ref 98–110)
CO2: 30 mmol/L (ref 20–32)
Calcium: 9.7 mg/dL (ref 8.6–10.4)
Creat: 0.66 mg/dL (ref 0.50–0.99)
GFR, EST AFRICAN AMERICAN: 104 mL/min/{1.73_m2} (ref >=60)
GFR, EST NON AFRICAN AMERICAN: 90 mL/min/{1.73_m2} (ref >=60)
Glucose, Bld: 77 mg/dL (ref 65–99)
POTASSIUM: 3.7 mmol/L (ref 3.5–5.3)
Sodium: 135 mmol/L (ref 135–146)

## 2016-12-21 MED ORDER — APIXABAN 5 MG PO TABS: 5.0000 mg | ORAL_TABLET | Freq: Two times a day (BID) | ORAL | 5 refills | Status: DC

## 2016-12-21 NOTE — Patient Instructions (Signed)
Okay to start the Eliquis when you are due for your next dose of the Xarelto.  You do not have to have a break from the medication.  To start taking it in its place.  The Eliquis is twice a day though so please be aware of that.

## 2016-12-21 NOTE — Progress Notes (Signed)
Subjective:    CC: HTN  HPI:  Hypertension- Pt denies chest pain, SOB, dizziness, or heart palpitations.  Taking meds as directed w/o problems.  Denies medication side effects.  Did bring in home blood pressure log.  Most of her blood pressures range from 119 417 systolic.  She did have a few blood pressures elevated in the 140s and 150s but most of them are actually within the normal range.  On Monday after a walk but the machine registered that she had an irregular heartbeat.  Follow-up bilateral pulmonary embolism-overall she is doing well on Xarelto but wants to consider switching to Eliquis.  She is still having intermittent lightheadedness though overall it is better than it was over the last couple of months.  She still just wonders if it could be a side effect of the medication.  He is scheduled for repeat CT of the chest in December.  Hypokalemia-she was started on regular potassium a little over a month ago.  She is also been experiencing palpitations.  She says they happen after lunch immediately after finishing eating.  She says her heart rate gets up a little over 100 up to 110.  She does not expense any chest pain or shortness of breath with it and it can last anywhere from 30 minutes to about an hour.  She usually try to lay down and stretch out and then it does seem to finally eased off.  This started over the last week or 2.  She also noted that one day when she tried to check her blood pressure after taking a walk on Monday the machine registered that she was experiencing an irregular heartbeat.  She had never seen that before.  Usually by about 4 or 5:00 in the early evening she will actually start to feel better.  She does take her Xarelto at night around 8 PM.  Past medical history, Surgical history, Family history not pertinant except as noted below, Social history, Allergies, and medications have been entered into the medical record, reviewed, and corrections made.   Review of  Systems: No fevers, chills, night sweats, weight loss, chest pain, or shortness of breath.   Objective:    General: Well Developed, well nourished, and in no acute distress.  Neuro: Alert and oriented x3, extra-ocular muscles intact, sensation grossly intact.  HEENT: Normocephalic, atraumatic  Skin: Warm and dry, no rashes. Cardiac: Regular rate and rhythm, no murmurs rubs or gallops, no lower extremity edema.  Respiratory: Clear to auscultation bilaterally. Not using accessory muscles, speaking in full sentences.   Impression and Recommendations:    HTN - Well controlled. Continue current regimen. Follow up in  6 months.    Bilateral pulmonary embolism-she really wants to try switching to Eliquis.  She still is experiencing some lightheadedness though it is better than it was a month ago.  She really wonders if it could be a side effect of the Xarelto. Hypokalemia -due to recheck potassium today.  She has been taking her potassium regularly but did skip it this morning.  Palpitations-unclear etiology.  Not sure what exactly will be triggered by eating and always happens after lunch but not after other meals.  We discussed the possibility of getting a 48-hour cardiac monitor especially since she also had an episode where her machine at home read that she had an irregular heartbeat.  Today in the office she has a regular rate and rhythm.  Dizziness-In studies 2% of patients on Xarelto reported dizziness.  Really her symptoms are more consistent with lightheadedness.  She like to try switching to Eliquis for 1 month which I think is perfectly reasonable.  We will make the change and she can let me know if she feels better or not.  Reports decreased appetite particularly at breakfast time but she does make herself eat regularly.  Not exactly clear why she is having the decreased appetite.  Consider adding a PPI since she is on anticoagulant.

## 2016-12-21 NOTE — Progress Notes (Signed)
All labs are normal. 

## 2017-01-03 ENCOUNTER — Telehealth: Payer: Self-pay

## 2017-01-03 DIAGNOSIS — I10 Essential (primary) hypertension: Secondary | ICD-10-CM

## 2017-01-03 NOTE — Telephone Encounter (Signed)
OK, if she comes off then lets recheck her potassium in one week.

## 2017-01-03 NOTE — Telephone Encounter (Signed)
Erika Strong states she is not going to continue to take the Potassium tablets because it is causing her to have nausea and vomiting.

## 2017-01-03 NOTE — Telephone Encounter (Signed)
Pt advised, lab order placed.

## 2017-01-10 LAB — COMPLETE METABOLIC PANEL WITH GFR
AG Ratio: 1.4 (calc) (ref 1.0–2.5)
ALBUMIN MSPROF: 3.9 g/dL (ref 3.6–5.1)
ALT: 18 U/L (ref 6–29)
AST: 22 U/L (ref 10–35)
Alkaline phosphatase (APISO): 104 U/L (ref 33–130)
BUN: 11 mg/dL (ref 7–25)
CALCIUM: 9.8 mg/dL (ref 8.6–10.4)
CHLORIDE: 96 mmol/L — AB (ref 98–110)
CO2: 33 mmol/L — ABNORMAL HIGH (ref 20–32)
CREATININE: 0.74 mg/dL (ref 0.50–0.99)
GFR, Est African American: 96 mL/min/{1.73_m2} (ref >=60)
GFR, Est Non African American: 83 mL/min/{1.73_m2} (ref >=60)
GLUCOSE: 88 mg/dL (ref 65–99)
Globulin: 2.8 g/dL (calc) (ref 1.9–3.7)
Potassium: 3.8 mmol/L (ref 3.5–5.3)
SODIUM: 137 mmol/L (ref 135–146)
TOTAL PROTEIN: 6.7 g/dL (ref 6.1–8.1)
Total Bilirubin: 0.5 mg/dL (ref 0.2–1.2)

## 2017-01-11 NOTE — Telephone Encounter (Signed)
All labs are normal. 

## 2017-01-18 ENCOUNTER — Ambulatory Visit (INDEPENDENT_AMBULATORY_CARE_PROVIDER_SITE_OTHER): Payer: Medicare Other

## 2017-01-18 ENCOUNTER — Ambulatory Visit: Payer: Medicare Other | Admitting: Family Medicine

## 2017-01-18 ENCOUNTER — Encounter: Payer: Self-pay | Admitting: Family Medicine

## 2017-01-18 VITALS — BP 131/61 | HR 74 | Temp 98.6°F | Ht 66.0 in | Wt 213.0 lb

## 2017-01-18 DIAGNOSIS — M25552 Pain in left hip: Secondary | ICD-10-CM

## 2017-01-18 DIAGNOSIS — R42 Dizziness and giddiness: Secondary | ICD-10-CM | POA: Diagnosis not present

## 2017-01-18 DIAGNOSIS — E876 Hypokalemia: Secondary | ICD-10-CM

## 2017-01-18 DIAGNOSIS — I2699 Other pulmonary embolism without acute cor pulmonale: Secondary | ICD-10-CM | POA: Diagnosis not present

## 2017-01-18 DIAGNOSIS — R002 Palpitations: Secondary | ICD-10-CM | POA: Diagnosis not present

## 2017-01-18 LAB — BASIC METABOLIC PANEL WITH GFR
BUN: 10 mg/dL (ref 7–25)
CO2: 32 mmol/L (ref 20–32)
Calcium: 9.6 mg/dL (ref 8.6–10.4)
Chloride: 96 mmol/L — ABNORMAL LOW (ref 98–110)
Creat: 0.65 mg/dL (ref 0.50–0.99)
GFR, EST AFRICAN AMERICAN: 105 mL/min/{1.73_m2} (ref >=60)
GFR, Est Non African American: 91 mL/min/{1.73_m2} (ref >=60)
GLUCOSE: 76 mg/dL (ref 65–99)
POTASSIUM: 3.7 mmol/L (ref 3.5–5.3)
SODIUM: 136 mmol/L (ref 135–146)

## 2017-01-18 NOTE — Progress Notes (Signed)
Subjective:    Patient ID: Erika Strong, female    DOB: 1945/10/09, 71 y.o.   MRN: [id number]  HPI  She did want to let me know that more recently she stepped back and fell into the leaves.  She was trying to get something out of a building, some old supplies.  Her husband said that he would hold her but he was not able to and she fell backwards.  She said for the first week after the fall she did really did not have much discomfort at all but the week after she noticed a little soreness in that left groin crease.  She is worried about the potential for fracture because of bony metastasis.  She says this week it has actually felt better and she is been able to walk without any difficulty but still feels just a little sore.  Bilateral pulmonary embolism-F/U. We recnetly switched her to Eliquis form Xarelto bc of feeling lightheaded. She felt like it started when she started he Xarelto and hasn't gone away.  She is feeling much better overall.  She is noticing she is much less short of breath.  She has more stamina with activities.  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  Hypokalemia -she was on potassium for severeal weeks but decided to stop it bc of nausea after taking the medication.  Palpitations-unclear etiology.  Not sure what exactly will be triggered by eating and always happens after lunch but not after other meals.  We discussed the possibility of getting a 48-hour cardiac monitor especially since she also had an episode where her machine at home read that she had an irregular heartbeat.  Today in the office she has a regular rate and rhythm. She has felt much better.   Dizziness-overall her dizziness is much better since switching to the Eliquis.  In fact is completely resolved.  Feels like her palpitations are much less frequent.  She has not had any concerns complaints or side effects with the Eliquis.  Does want to know if  she can eat greens.  Review of Systems   BP 131/61   Pulse 74   Temp 98.6 F (37 C)   Ht 5\' 6"  (1.676 m)   Wt 213 lb (96.6 kg)   SpO2 97%   BMI 34.38 kg/m     Allergies  Allergen Reactions  . Granix [Filgrastim]     Shot on [employer]. Developed chills and fever  . Atorvastatin Other (See Comments)     myalgias  . Fish Allergy Nausea And Vomiting  . Lisinopril Other (See Comments)     Cough  . Pravastatin Sodium Other (See Comments)     myalgias  . Simvastatin Other (See Comments)     myalgias    Past Medical History:  Diagnosis Date  . Anemia due to chemotherapy 01/23/2014  . Cancer ([place])   . Depression 10/22/2016  . Diverticulitis 2010  . Endometrial cancer ([place]) 11/02/2010  . Hepatic steatosis 02/12/2014  . Hyperlipidemia    2007  . Hypertension   . Malignant neoplasm of corpus uteri, except isthmus ([place]) 01/21/2011  . Wears glasses     Past Surgical History:  Procedure Laterality Date  . ABDOMINAL HYSTERECTOMY  11/24/2010   RTLH, BSO, RPLND, LPLNS  . GONADECTOMY AND HYSTERECTOMY  11/24/2010   Endometrial cancer, performed by Dr. Janie Morning  . MOLE REMOVAL  Nov 2012  Social History   Socioeconomic History  . Marital status: Married    Spouse name: [name]  . Number of children: 1   . Years of education: Assoc degr  . Highest education level: Not on file  Social Needs  . Financial resource strain: Not on file  . Food insecurity - worry: Not on file  . Food insecurity - inability: Not on file  . Transportation needs - medical: Not on file  . Transportation needs - non-medical: Not on file  Occupational History  . Occupation: Retail buyer: [employer]  Tobacco Use  . Smoking status: Never Smoker  . Smokeless tobacco: Never Used  Substance and Sexual Activity  . Alcohol use: No  . Drug use: No  . Sexual activity: No    Comment: cosmetologist, teaches, associate degree, married, regularly exercises.   Other  Topics Concern  . Not on file  Social History Narrative   Walks daily for exercise.     Family History  Problem Relation Age of Onset  . Lung cancer Father        smoker  . Cancer Father   . Osteoporosis Mother   . Rheum arthritis Mother   . Cancer Mother     Outpatient Encounter Medications as of 01/18/2017  Medication Sig  . acyclovir ointment (ZOVIRAX) 5 % Apply to fever blister 5 times a day  . apixaban (ELIQUIS) 5 MG TABS tablet Take 1 tablet (5 mg total) by mouth 2 (two) times daily.  . Ascorbic Acid (VITAMIN C CR) 1000 MG TBCR Take 1,000 mg by mouth daily.  . B Complex-C (B-COMPLEX WITH VITAMIN C) tablet Take 1 tablet by mouth daily.  . chlorthalidone (HYGROTON) 50 MG tablet TAKE 1 TABLET(50 MG) BY MOUTH DAILY  . gabapentin (NEURONTIN) 100 MG capsule Take 1 tab in AM, 1 mid-afternoon, and 2 at bedtime.  Marland Kitchen letrozole (FEMARA) 2.5 MG tablet Take 2.5 mg by mouth.  Marland Kitchen OVER THE COUNTER MEDICATION Take 1 tablet by mouth daily. Curamin  . OVER THE COUNTER MEDICATION Take 3 tablets by mouth daily. New Chapter Bone Strength (calcium product)  . OVER THE COUNTER MEDICATION Take 1 packet by mouth 2 (two) times daily. Preston Fleeting Vitamin Pak (contains fish oil, potassium and multi vitamins)  . OVER THE COUNTER MEDICATION Take 3 tablets by mouth 3 (three) times daily. Intestinal Sooth and Build  . OVER THE COUNTER MEDICATION Take 2 tablets by mouth 2 (two) times daily. Stress J  . OVER THE COUNTER MEDICATION Take 1 tablet by mouth daily. Liver Care  . OVER THE COUNTER MEDICATION Take 10 mLs by mouth daily with breakfast. Liquid chlorophyl  . OVER THE COUNTER MEDICATION Take 30 mLs by mouth daily with breakfast. Flax Seed Oil  . potassium chloride (MICRO-K) 10 MEQ CR capsule Take 1 capsule (10 mEq total) by mouth daily.  . Probiotic Product (PROBIOTIC DAILY) CAPS Take 1 capsule by mouth daily.  . traZODone (DESYREL) 50 MG tablet Take 0.5-1 tablets (25-50 mg total) by mouth at bedtime as  needed for sleep.  Marland Kitchen Ubiquinol 100 MG CAPS Take 100 mg by mouth daily with breakfast.    No facility-administered encounter medications on file as of 01/18/2017.          Objective:   Physical Exam  Constitutional: She is oriented to person, place, and time. She appears well-developed and well-nourished.  HENT:  Head: Normocephalic and atraumatic.  Cardiovascular: Normal rate, regular rhythm and normal heart sounds.  Pulmonary/Chest: Effort normal and breath sounds normal.  Musculoskeletal:  Left hip knee and ankle with strength 5 out of 5 in all directions.  No significant discomfort with internal or external rotation.  Neurological: She is alert and oriented to person, place, and time.  Skin: Skin is warm and dry.  Psychiatric: She has a normal mood and affect. Her behavior is normal.        Assessment & Plan:  Hypokalemia - recheck potassium since has been off for about a week.    Bilat PE -overall her endurance is improving.  She has a follow-up scan at the end of December to recheck her lungs.  Dizziness - Resolved after switching to Eliquis.   Palpitations - Much improved but not resolved. Will continue to monitor.   Left hip pain-status post fall though low impact injury.  I think fracture is unlikely based on her history but because of her risk factors we will go ahead and get x-ray today just to rule it out.  She wants to make sure that it is safe to go back to work on Thursday.

## 2017-01-26 ENCOUNTER — Other Ambulatory Visit: Payer: Self-pay | Admitting: Family Medicine

## 2017-02-04 ENCOUNTER — Encounter: Payer: Self-pay | Admitting: Family Medicine

## 2017-02-23 ENCOUNTER — Encounter: Payer: Self-pay | Admitting: Family Medicine

## 2017-03-01 ENCOUNTER — Telehealth: Payer: Self-pay | Admitting: Family Medicine

## 2017-03-01 NOTE — Telephone Encounter (Signed)
Perfect thank you!

## 2017-03-01 NOTE — Telephone Encounter (Signed)
Pt called to advise PCP she got some labs completed at [facility] and her Potassium was up to 4.0. Results are in care everywhere.

## 2017-03-11 ENCOUNTER — Encounter: Payer: Self-pay | Admitting: Family Medicine

## 2017-03-11 ENCOUNTER — Ambulatory Visit: Payer: Medicare Other | Admitting: Family Medicine

## 2017-03-11 ENCOUNTER — Telehealth: Payer: Self-pay

## 2017-03-11 ENCOUNTER — Emergency Department
Admission: EM | Admit: 2017-03-11 | Discharge: 2017-03-11 | Discharge status: Absent without leave | Payer: Medicare Other | Source: Home / Self Care

## 2017-03-11 VITALS — BP 142/84 | HR 85 | Ht 66.0 in | Wt 215.0 lb

## 2017-03-11 DIAGNOSIS — R600 Localized edema: Secondary | ICD-10-CM

## 2017-03-11 DIAGNOSIS — L03115 Cellulitis of right lower limb: Secondary | ICD-10-CM | POA: Diagnosis not present

## 2017-03-11 DIAGNOSIS — C541 Malignant neoplasm of endometrium: Secondary | ICD-10-CM | POA: Diagnosis not present

## 2017-03-11 DIAGNOSIS — T50905A Adverse effect of unspecified drugs, medicaments and biological substances, initial encounter: Secondary | ICD-10-CM | POA: Diagnosis not present

## 2017-03-11 MED ORDER — DOXYCYCLINE HYCLATE 100 MG PO TABS: 100.0000 mg | ORAL_TABLET | Freq: Two times a day (BID) | ORAL | 0 refills | Status: DC

## 2017-03-11 NOTE — Telephone Encounter (Signed)
Patient called in stating she was seen at [facility] yesterday (Thursday) and Dx Cellulitis; Rx Keflex 500 mg BID. Patient states she is itching and noticed a rash on her abdomen area last night and this morning. Patient denies any chest pain, shortness of breath, lightheadedness, dizziness. Patient states she has not taken a dose this morning. Asked patient if she had any Benadryl - patient advised she did not.  Advised patient to be seen immediately at the nearest Urgent Care or ED/ER because from her description it sounds like she is having an allergic reaction to the Keflex antibiotic. 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 I advised the patient that she needed to be seen immediately for medical treatment because of not knowing the certainty of the path of the allergic reaction. Patient stated she would take care of her client then come to [facility] for evaluation and treatment accordingly.

## 2017-03-11 NOTE — Progress Notes (Signed)
Subjective:    Patient ID: Erika Strong, female    DOB: 08-May-1945, 72 y.o.   MRN: [id number]  HPI 72 yo female comes in today complaining of a rash.  She was seen by her oncologist about a week ago and had a red rash on her right lower leg.  They felt it was most consistent with cellulitis and put her on Keflex.  She started the medication and then about 3 days ago started to notice a little bit of a rash on her forearms and some itching.  It has progressed and now this morning she has the similar rash over her lower abdomen.  She does feel like the initial rash on her right lower leg is better than it was previously but she also has some new areas on her inner leg.  She admits she is also been getting into some salt lately and so has been swelling more around her ankles and legs.  She has not been wearing her compression stockings.    Regards to her endometrial cancer she is doing really well.  She has had a recent CT scan which showed a decrease in soft tissue mass in the right obturator.  She also still has a stable anterior peritoneal nodule.  Review of Systems   BP (!) 142/84   Pulse 85   Ht 5\' 6"  (1.676 m)   Wt 215 lb (97.5 kg)   SpO2 100%   BMI 34.70 kg/m     Allergies  Allergen Reactions  . Granix [Filgrastim]     Shot on [employer]. Developed chills and fever  . Atorvastatin Other (See Comments)     myalgias  . Fish Allergy Nausea And Vomiting  . Lisinopril Other (See Comments)     Cough  . Pravastatin Sodium Other (See Comments)     myalgias  . Simvastatin Other (See Comments)     myalgias  . Keflex [Cephalexin] Rash    Past Medical History:  Diagnosis Date  . Anemia due to chemotherapy 01/23/2014  . Cancer ([place])   . Depression 10/22/2016  . Diverticulitis 2010  . Endometrial cancer ([place]) 11/02/2010  . Hepatic steatosis 02/12/2014  . Hyperlipidemia    2007  . Hypertension   . Malignant neoplasm of corpus uteri, except isthmus ([place]) 01/21/2011  . Wears glasses      Past Surgical History:  Procedure Laterality Date  . ABDOMINAL HYSTERECTOMY  11/24/2010   RTLH, BSO, RPLND, LPLNS  . GONADECTOMY AND HYSTERECTOMY  11/24/2010   Endometrial cancer, performed by Dr. Janie Morning  . MOLE REMOVAL  Nov 2012    Social History   Socioeconomic History  . Marital status: Married    Spouse name: [name]  . Number of children: 1   . Years of education: Assoc degr  . Highest education level: Not on file  Social Needs  . Financial resource strain: Not on file  . Food insecurity - worry: Not on file  . Food insecurity - inability: Not on file  . Transportation needs - medical: Not on file  . Transportation needs - non-medical: Not on file  Occupational History  . Occupation: Retail buyer: [employer]  Tobacco Use  . Smoking status: Never Smoker  . Smokeless tobacco: Never Used  Substance and Sexual Activity  . Alcohol use: No  . Drug use: No  . Sexual activity: No    Comment: cosmetologist, teaches, associate degree, married, regularly exercises.   Other Topics Concern  .  Not on file  Social History Narrative   Walks daily for exercise.     Family History  Problem Relation Age of Onset  . Lung cancer Father        smoker  . Cancer Father   . Osteoporosis Mother   . Rheum arthritis Mother   . Cancer Mother     Outpatient Encounter Medications as of 03/11/2017  Medication Sig  . acyclovir ointment (ZOVIRAX) 5 % Apply to fever blister 5 times a day  . apixaban (ELIQUIS) 5 MG TABS tablet Take 1 tablet (5 mg total) by mouth 2 (two) times daily.  . Ascorbic Acid (VITAMIN C CR) 1000 MG TBCR Take 1,000 mg by mouth daily.  . B Complex-C (B-COMPLEX WITH VITAMIN C) tablet Take 1 tablet by mouth daily.  . chlorthalidone (HYGROTON) 50 MG tablet TAKE 1 TABLET(50 MG) BY MOUTH DAILY  . letrozole (FEMARA) 2.5 MG tablet Take 2.5 mg by mouth.  Marland Kitchen OVER THE COUNTER MEDICATION Take 1 tablet by mouth daily. Curamin  . OVER  THE COUNTER MEDICATION Take 3 tablets by mouth daily. New Chapter Bone Strength (calcium product)  . OVER THE COUNTER MEDICATION Take 1 packet by mouth 2 (two) times daily. Preston Fleeting Vitamin Pak (contains fish oil, potassium and multi vitamins)  . OVER THE COUNTER MEDICATION Take 3 tablets by mouth 3 (three) times daily. Intestinal Sooth and Build  . OVER THE COUNTER MEDICATION Take 2 tablets by mouth 2 (two) times daily. Stress J  . OVER THE COUNTER MEDICATION Take 1 tablet by mouth daily. Liver Care  . OVER THE COUNTER MEDICATION Take 10 mLs by mouth daily with breakfast. Liquid chlorophyl  . OVER THE COUNTER MEDICATION Take 30 mLs by mouth daily with breakfast. Flax Seed Oil  . Probiotic Product (PROBIOTIC DAILY) CAPS Take 1 capsule by mouth daily.  . traZODone (DESYREL) 50 MG tablet Take 0.5-1 tablets (25-50 mg total) by mouth at bedtime as needed for sleep.  Marland Kitchen Ubiquinol 100 MG CAPS Take 100 mg by mouth daily with breakfast.   . UNABLE TO FIND Take by mouth.  . [DISCONTINUED] cephALEXin (KEFLEX) 500 MG capsule Take by mouth.  . doxycycline (VIBRA-TABS) 100 MG tablet Take 1 tablet (100 mg total) by mouth 2 (two) times daily.  . [DISCONTINUED] gabapentin (NEURONTIN) 100 MG capsule Take 1 tab in AM, 1 mid-afternoon, and 2 at bedtime.  . [DISCONTINUED] potassium chloride (MICRO-K) 10 MEQ CR capsule Take 1 capsule (10 mEq total) by mouth daily.   No facility-administered encounter medications on file as of 03/11/2017.           Objective:   Physical Exam  Constitutional: She is oriented to person, place, and time. She appears well-developed and well-nourished.  HENT:  Head: Normocephalic and atraumatic.  Cardiovascular: Normal rate, regular rhythm and normal heart sounds.  Pulmonary/Chest: Effort normal and breath sounds normal.  Musculoskeletal:  Right lower leg she has these erythematous maculopapular rash that is more of a bright red color on the right outer leg.  On the right  inner leg it is more of a pink maculopapular rash.  On her forearms it is similar in that it is a pink maculopapular rash with most of the lesions being anywhere from half a centimeter to a centimeter and a half.  They blanch easily with pressure.  She has 1+ pitting edema of the lower tibia bilaterally.  Neurological: She is alert and oriented to person, place, and time.  Skin: Skin is  warm and dry.  Psychiatric: She has a normal mood and affect. Her behavior is normal.          Assessment & Plan:  Drug reaction-discontinue Keflex.  Added to allergy list.  She deftly has a very distinct drug rash on her forearms and abdomen.  She already skipped this morning's medication which was perfect.  I do want her to take any extra medication today.  We actually did give her a 50 mg Benadryl tab to take with her in case the rash worsens, she gets increased itching, or she starts to notice any shortness of breath.  If she does get short of breath or any throat swelling then she is to seek emergency medical care immediately.  And patient understands that.  Cellulitis of right lower leg-overall she says that it looks better than it did previously.  I am not 100% convinced that it really is cellulitis.  It really almost looks more like a dermatitis but since she did get improvement while on the Keflex and going to go ahead and put her on 7 more days of doxycycline which she has taken before.  Endometrial cancer-continuing to do well.  Symptomatically she feels great.  Continue to follow with [facility].  Next follow-up in 3 months.  Lower extremity edema-discussed the importance of really reducing the edema.  Backing off on salt intake which she admits she has been a much more liberal with.  Also recommend elevating the feet for 10-15 minutes a couple times throughout the day in addition to wearing her compression stockings.  She has some at home she just has not really been using them.  Explained that reducing the  edema will actually improve the ability for the antibiotic to get to the skin and to promote healing.

## 2017-03-30 ENCOUNTER — Telehealth: Payer: Self-pay

## 2017-03-30 NOTE — Telephone Encounter (Signed)
No we can hold off. I apologize. I have no idea why they are calling her now.

## 2017-03-30 NOTE — Telephone Encounter (Signed)
Erika Strong called and states the cardiac department called to schedule her for a Holter monitor placement. She states she had palpitations back in October and the order was placed at that visit. She states since she has started the Eliquis the palpitations have resolved. She wanted to know if she still needed to have a Holter monitor placement. She did also question why it has taken so long to schedule.

## 2017-03-31 NOTE — Telephone Encounter (Signed)
Patient advised.

## 2017-04-06 ENCOUNTER — Other Ambulatory Visit: Payer: Self-pay | Admitting: Family Medicine

## 2017-04-06 ENCOUNTER — Ambulatory Visit (INDEPENDENT_AMBULATORY_CARE_PROVIDER_SITE_OTHER): Payer: Medicare Other | Admitting: Family Medicine

## 2017-04-06 ENCOUNTER — Encounter: Payer: Self-pay | Admitting: Family Medicine

## 2017-04-06 VITALS — BP 157/61 | HR 95 | Ht 66.0 in | Wt 216.0 lb

## 2017-04-06 DIAGNOSIS — I872 Venous insufficiency (chronic) (peripheral): Secondary | ICD-10-CM

## 2017-04-06 DIAGNOSIS — R600 Localized edema: Secondary | ICD-10-CM | POA: Diagnosis not present

## 2017-04-06 DIAGNOSIS — Z79899 Other long term (current) drug therapy: Secondary | ICD-10-CM | POA: Diagnosis not present

## 2017-04-06 MED ORDER — FUROSEMIDE 40 MG PO TABS: 40.0000 mg | ORAL_TABLET | Freq: Every day | ORAL | 0 refills | Status: DC | PRN

## 2017-04-06 NOTE — Progress Notes (Signed)
   Subjective:    Patient ID: Erika Strong, female    DOB: 1945-05-12, 72 y.o.   MRN: [id number]  HPI 72 yo female comes in for bilat leg swelling and redness on her right lower leg.  She had been treated previously by her oncologist for this and by the time a saw her 2 weeks ago it was some better.  She has been wearing her compression stockings some and she has been taking her chlorthalidone 50 mg daily.  Says her leg s looked good yesterday but then when she got up this mornign the skin looked more red and as the morning went on she noticed more swelling. Has been up backing and cooking and standing on her feet a lot today.     Review of Systems     Objective:   Physical Exam  Constitutional: She is oriented to person, place, and time. She appears well-developed and well-nourished.  HENT:  Head: Normocephalic and atraumatic.  Cardiovascular: Normal rate, regular rhythm and normal heart sounds.  Pulmonary/Chest: Effort normal and breath sounds normal.  Neurological: She is alert and oriented to person, place, and time.  Skin: Skin is warm and dry.  1+ pitting edema up to the right knee.  Trace pitting up to the knee on the left knee.  She has some erythema on the lower medial leg that blanches easily.  No wounds.   Psychiatric: She has a normal mood and affect. Her behavior is normal.         Assessment & Plan:  Venous stasis dermatitis - gave reassurance. No sign of infection.    BIlat LE edema - will add lasix 40 mg prn for excess swelling. Asked her to keep log of how often uses the med and take na extra potassium on the days she uses the lasix. Check BMP in one week.  Reminded her to wear her compression stockings and avoid salt.  Had normal CBC and CMP at [facility] about 5 weeks ago.  Consider further w/u if not improving. Can eval for heart failure and thyroid d/o.

## 2017-04-06 NOTE — Patient Instructions (Signed)
Recheck your BMP in about 10 days.

## 2017-04-07 ENCOUNTER — Ambulatory Visit: Payer: Medicare Other | Admitting: Family Medicine

## 2017-04-14 ENCOUNTER — Telehealth: Payer: Self-pay | Admitting: Family Medicine

## 2017-04-14 NOTE — Telephone Encounter (Signed)
Pt advised of PCP recommendation. No further questions.

## 2017-04-14 NOTE — Telephone Encounter (Signed)
Pt called clinic to see if there were any special instructions she should advise her dentist/dental hygienist at her upcoming appt on Monday. She is currently taking Eliquis daily. Will route.

## 2017-04-14 NOTE — Telephone Encounter (Signed)
No special instructions if just having a cleaning and check up.  Just let them know she is on it but she don't have to hold med for a regular cleaning.

## 2017-04-18 LAB — BASIC METABOLIC PANEL WITH GFR
BUN: 14 mg/dL (ref 7–25)
CO2: 31 mmol/L (ref 20–32)
CREATININE: 0.73 mg/dL (ref 0.50–0.99)
Calcium: 9.6 mg/dL (ref 8.6–10.4)
Chloride: 98 mmol/L (ref 98–110)
GFR, EST AFRICAN AMERICAN: 97 mL/min/{1.73_m2} (ref >=60)
GFR, Est Non African American: 84 mL/min/{1.73_m2} (ref >=60)
GLUCOSE: 99 mg/dL (ref 65–99)
Potassium: 3.3 mmol/L — ABNORMAL LOW (ref 3.5–5.3)
SODIUM: 139 mmol/L (ref 135–146)

## 2017-04-19 ENCOUNTER — Other Ambulatory Visit: Payer: Self-pay | Admitting: *Deleted

## 2017-04-19 DIAGNOSIS — E876 Hypokalemia: Secondary | ICD-10-CM

## 2017-04-20 ENCOUNTER — Encounter: Payer: Self-pay | Admitting: Family Medicine

## 2017-04-20 ENCOUNTER — Ambulatory Visit: Payer: Medicare Other | Admitting: Family Medicine

## 2017-04-20 VITALS — BP 144/66 | HR 84 | Ht 66.0 in | Wt 213.0 lb

## 2017-04-20 DIAGNOSIS — I10 Essential (primary) hypertension: Secondary | ICD-10-CM

## 2017-04-20 DIAGNOSIS — I878 Other specified disorders of veins: Secondary | ICD-10-CM | POA: Diagnosis not present

## 2017-04-20 DIAGNOSIS — K760 Fatty (change of) liver, not elsewhere classified: Secondary | ICD-10-CM

## 2017-04-20 MED ORDER — AMBULATORY NON FORMULARY MEDICATION: 0 refills | Status: DC

## 2017-04-20 NOTE — Progress Notes (Signed)
Subjective:    CC: BP, f/u swelling in legs.   HPI:  Hypertension- Pt denies chest pain, SOB, dizziness, or heart palpitations.  Taking meds as directed w/o problems.  Denies medication side effects.    Venous stasis with persistent lower extremity edema-she currently takes chlorthalidone daily.  And then Lasix as needed.  She actually never ended up taking the Lasix.  The swelling got better on its own and seemed to resolve.  She is also been wearing her husband's compression stockings intermittently.  Hepatic steatosis-last set of liver enzymes 4 months ago actually looked good and were in the normal range.  Past medical history, Surgical history, Family history not pertinant except as noted below, Social history, Allergies, and medications have been entered into the medical record, reviewed, and corrections made.   Review of Systems: No fevers, chills, night sweats, weight loss, chest pain, or shortness of breath.   Objective:    General: Well Developed, well nourished, and in no acute distress.  Neuro: Alert and oriented x3, extra-ocular muscles intact, sensation grossly intact.  HEENT: Normocephalic, atraumatic  Skin: Warm and dry, no rashes. Cardiac: Regular rate and rhythm, no murmurs rubs or gallops, no lower extremity edema.  Respiratory: Clear to auscultation bilaterally. Not using accessory muscles, speaking in full sentences.   Impression and Recommendations:    HTN -blood pressure just slightly above goal.  We will have her come back in about 2 weeks for nurse blood pressure check.  Otherwise continue current regimen continue to watch salt in diet.  Hepatic steatosis-continue work on [employer] and staying active.  We will continue to monitor every 6 months.  Chronic venous stasis -doing much better.  Use the furosemide as needed and continue wearing the compression stockings.  Discussed shingles vaccine. H.O. Given.   Due for Tdap.  Prescription given to get done  at the pharmacy.

## 2017-04-22 ENCOUNTER — Other Ambulatory Visit: Payer: Self-pay | Admitting: Family Medicine

## 2017-05-02 ENCOUNTER — Ambulatory Visit (INDEPENDENT_AMBULATORY_CARE_PROVIDER_SITE_OTHER): Payer: Medicare Other | Admitting: Family Medicine

## 2017-05-02 VITALS — BP 133/71 | HR 75

## 2017-05-02 DIAGNOSIS — I10 Essential (primary) hypertension: Secondary | ICD-10-CM | POA: Diagnosis not present

## 2017-05-02 LAB — BASIC METABOLIC PANEL WITH GFR
BUN: 12 mg/dL (ref 7–25)
CO2: 33 mmol/L — ABNORMAL HIGH (ref 20–32)
CREATININE: 0.76 mg/dL (ref 0.60–0.93)
Calcium: 9.7 mg/dL (ref 8.6–10.4)
Chloride: 98 mmol/L (ref 98–110)
GFR, EST AFRICAN AMERICAN: 92 mL/min/{1.73_m2} (ref >=60)
GFR, EST NON AFRICAN AMERICAN: 79 mL/min/{1.73_m2} (ref >=60)
Glucose, Bld: 85 mg/dL (ref 65–99)
Potassium: 3.4 mmol/L — ABNORMAL LOW (ref 3.5–5.3)
SODIUM: 139 mmol/L (ref 135–146)

## 2017-05-02 NOTE — Progress Notes (Signed)
Pt came into clinic today for BP check. At last OV, there were no Rx changes but Pt was advised to watch her sodium intake. Pt reports she is "trying." Pt does state she has been taking her BP at home, all values are at goal. Denies any chest pain, palpitations, dizziness today. Pt's BP was at goal. Will route to PCP for review. Pt advised we would contact her with any recommendations. She also reports she is going to the lab today to get her Potassium rechecked. Order has been placed.

## 2017-05-02 NOTE — Progress Notes (Signed)
Pt advised. No further questions.  

## 2017-05-02 NOTE — Progress Notes (Signed)
Agree with documentation as above.   Jirah Rider, MD  

## 2017-05-03 ENCOUNTER — Other Ambulatory Visit: Payer: Self-pay | Admitting: *Deleted

## 2017-05-03 DIAGNOSIS — E876 Hypokalemia: Secondary | ICD-10-CM

## 2017-05-22 ENCOUNTER — Other Ambulatory Visit: Payer: Self-pay | Admitting: Family Medicine

## 2017-05-30 ENCOUNTER — Other Ambulatory Visit: Payer: Self-pay

## 2017-05-30 DIAGNOSIS — E876 Hypokalemia: Secondary | ICD-10-CM

## 2017-05-30 LAB — POTASSIUM: Potassium: 3.7 mmol/L (ref 3.5–5.3)

## 2017-06-21 ENCOUNTER — Other Ambulatory Visit: Payer: Self-pay | Admitting: Family Medicine

## 2017-07-04 ENCOUNTER — Ambulatory Visit: Payer: Medicare Other | Admitting: Family Medicine

## 2017-07-04 ENCOUNTER — Encounter: Payer: Self-pay | Admitting: Family Medicine

## 2017-07-04 VITALS — BP 132/84 | HR 88 | Ht 66.0 in | Wt 212.0 lb

## 2017-07-04 DIAGNOSIS — L299 Pruritus, unspecified: Secondary | ICD-10-CM

## 2017-07-04 DIAGNOSIS — R21 Rash and other nonspecific skin eruption: Secondary | ICD-10-CM | POA: Diagnosis not present

## 2017-07-04 NOTE — Patient Instructions (Addendum)
Stop the stress J for now.   Okay to continue the Caladryl lotion. Will call as soon as lab work is been back

## 2017-07-04 NOTE — Progress Notes (Signed)
Subjective:    Patient ID: Erika Strong, female    DOB: 1945/08/05, 72 y.o.   MRN: [id number]  HPI   72 year old female comes in today complaining of a rash that is been present for about 2 weeks.  It started out with a lesion on her back and now she has about 4 of them on her back, one on her mid upper stomach area a couple smaller lesions on her left arm.  She says they are itchy.  She did have a tick bite her about a week ago but the rash actually started 2 weeks ago.  She says the rash is itchy.  She is been using Caladryl and Polysporin.  No recent colds.  No fevers chills or sweats.  No new prescription medications.  Though she did start a new stress supplement called stress J.  No new soaps, lotions, detergents, etc.     Review of Systems   BP 132/84   Pulse 88   Ht 5\' 6"  (1.676 m)   Wt 212 lb (96.2 kg)   SpO2 100%   BMI 34.22 kg/m     Allergies  Allergen Reactions  . Granix [Filgrastim]     Shot on [employer]. Developed chills and fever  . Atorvastatin Other (See Comments)     myalgias  . Fish Allergy Nausea And Vomiting  . Lisinopril Other (See Comments)     Cough  . Pravastatin Sodium Other (See Comments)     myalgias  . Simvastatin Other (See Comments)     myalgias  . Keflex [Cephalexin] Rash    Past Medical History:  Diagnosis Date  . Anemia due to chemotherapy 01/23/2014  . Cancer ([place])   . Depression 10/22/2016  . Diverticulitis 2010  . Endometrial cancer ([place]) 11/02/2010  . Hepatic steatosis 02/12/2014  . Hyperlipidemia    2007  . Hypertension   . Malignant neoplasm of corpus uteri, except isthmus ([place]) 01/21/2011  . Wears glasses     Past Surgical History:  Procedure Laterality Date  . ABDOMINAL HYSTERECTOMY  11/24/2010   RTLH, BSO, RPLND, LPLNS  . GONADECTOMY AND HYSTERECTOMY  11/24/2010   Endometrial cancer, performed by Dr. Janie Morning  . MOLE REMOVAL  Nov 2012    Social History   Socioeconomic History  . Marital status: Married    Spouse  name: [name]  . Number of children: 1   . Years of education: Assoc degr  . Highest education level: Not on file  Occupational History  . Occupation: Retail buyer: [employer]  . Financial resource strain: Not on file  . Food insecurity:    Worry: Not on file    Inability: Not on file  . Transportation needs:    Medical: Not on file    Non-medical: Not on file  Tobacco Use  . Smoking status: Never Smoker  . Smokeless tobacco: Never Used  Substance and Sexual Activity  . Alcohol use: No  . Drug use: No  . Sexual activity: Never    Comment: cosmetologist, teaches, associate degree, married, regularly exercises.   Lifestyle  . Physical activity:    Days per week: Not on file    Minutes per session: Not on file  . Stress: Not on file  Relationships  . Social connections:    Talks on phone: Not on file    Gets together: Not on file    Attends religious service: Not on file  Active member of club or organization: Not on file    Attends meetings of clubs or organizations: Not on file    Relationship status: Not on file  . Intimate partner violence:    Fear of current or ex partner: Not on file    Emotionally abused: Not on file    Physically abused: Not on file    Forced sexual activity: Not on file  Other Topics Concern  . Not on file  Social History Narrative   Walks daily for exercise.     Family History  Problem Relation Age of Onset  . Lung cancer Father        smoker  . Cancer Father   . Osteoporosis Mother   . Rheum arthritis Mother   . Cancer Mother     Outpatient Encounter Medications as of 07/04/2017  Medication Sig  . acyclovir ointment (ZOVIRAX) 5 % Apply to fever blister 5 times a day  . Ascorbic Acid (VITAMIN C CR) 1000 MG TBCR Take 1,000 mg by mouth daily.  . B Complex-C (B-COMPLEX WITH VITAMIN C) tablet Take 1 tablet by mouth daily.  . chlorthalidone (HYGROTON) 50 MG tablet TAKE 1 TABLET(50 MG) BY  MOUTH DAILY  . ELIQUIS 5 MG TABS tablet TAKE 1 TABLET(5 MG) BY MOUTH TWICE DAILY  . furosemide (LASIX) 40 MG tablet TAKE 1 TABLET(40 MG) BY MOUTH DAILY AS NEEDED FOR SWELLING  . letrozole (FEMARA) 2.5 MG tablet Take 2.5 mg by mouth.  Marland Kitchen OVER THE COUNTER MEDICATION Take 1 tablet by mouth daily. Curamin  . OVER THE COUNTER MEDICATION Take 3 tablets by mouth daily. [place] Bone Strength (calcium product)  . OVER THE COUNTER MEDICATION Take 1 packet by mouth 2 (two) times daily. [place] Fleeting Vitamin Pak (contains fish oil, potassium and multi vitamins)  . OVER THE COUNTER MEDICATION Take 3 tablets by mouth 3 (three) times daily. Intestinal Sooth and Build  . OVER THE COUNTER MEDICATION Take 2 tablets by mouth 2 (two) times daily. Stress J  . OVER THE COUNTER MEDICATION Take 1 tablet by mouth daily. Liver Care  . OVER THE COUNTER MEDICATION Take 10 mLs by mouth daily with breakfast. Liquid chlorophyl  . OVER THE COUNTER MEDICATION Take 30 mLs by mouth daily with breakfast. Flax Seed Oil  . Probiotic Product (PROBIOTIC DAILY) CAPS Take 1 capsule by mouth daily.  . traZODone (DESYREL) 50 MG tablet Take 0.5-1 tablets (25-50 mg total) by mouth at bedtime as needed for sleep.  Marland Kitchen Ubiquinol 100 MG CAPS Take 100 mg by mouth daily with breakfast.   . UNABLE TO FIND Take by mouth.  . [DISCONTINUED] AMBULATORY NON FORMULARY MEDICATION Medication Name: Tdap IM x 1   No facility-administered encounter medications on file as of 07/04/2017.          Objective:   Physical Exam  Constitutional: She is oriented to person, place, and time. She appears well-developed and well-nourished.  HENT:  Head: Normocephalic and atraumatic.  Eyes: Conjunctivae and EOM are normal.  Cardiovascular: Normal rate.  Pulmonary/Chest: Effort normal.  Neurological: She is alert and oriented to person, place, and time.  Skin: Skin is dry. No pallor.  On her upper back she has 3 larger erythematous areas.  They have a  maculopapular texture.  Some areas feel just a little bit more indurated and some feel completely flat.  The border fades on all of the lesions.  On the upper when she has a little bit of cream on  it it is not scaly.  Psychiatric: She has a normal mood and affect. Her behavior is normal.  Vitals reviewed.           Assessment & Plan:  Rash -unclear etiology.  The erythematous areas are maculopapular.  Some of areas still feel edematous.  It almost looks like large urticaria.  But she is not really had any significant changes.  She did recently start an over-the-counter stress supplements found to have her stop that but I do not think that is probably really the cause.  In the meantime we will also do sed rate as well as a CBC and call her back with those results.  In the meantime she can continue Caladryl lotion and if she gets any new lesions please give [place] a call back.  Also consider rare but potential side effect of her letrozole.  Tick bite -

## 2017-07-05 ENCOUNTER — Other Ambulatory Visit: Payer: Self-pay | Admitting: *Deleted

## 2017-07-05 DIAGNOSIS — R21 Rash and other nonspecific skin eruption: Secondary | ICD-10-CM

## 2017-07-05 LAB — CBC WITH DIFFERENTIAL/PLATELET
BASOS ABS: 28 {cells}/uL (ref 0–200)
Basophils Relative: 0.3 %
EOS PCT: 2.6 %
Eosinophils Absolute: 244 cells/uL (ref 15–500)
HCT: 40.4 % (ref 35.0–45.0)
Hemoglobin: 13.9 g/dL (ref 11.7–15.5)
Lymphs Abs: 2068 cells/uL (ref 850–3900)
MCH: 29.8 pg (ref 27.0–33.0)
MCHC: 34.4 g/dL (ref 32.0–36.0)
MCV: 86.5 fL (ref 80.0–100.0)
MONOS PCT: 8.6 %
MPV: 10.9 fL (ref 7.5–12.5)
NEUTROS PCT: 66.5 %
Neutro Abs: 6251 cells/uL (ref 1500–7800)
Platelets: 254 10*3/uL (ref 140–400)
RBC: 4.67 10*6/uL (ref 3.80–5.10)
RDW: 13.1 % (ref 11.0–15.0)
Total Lymphocyte: 22 %
WBC mixed population: 808 cells/uL (ref 200–950)
WBC: 9.4 10*3/uL (ref 3.8–10.8)

## 2017-07-05 LAB — BASIC METABOLIC PANEL WITH GFR
BUN/Creatinine Ratio: 13 (calc) (ref 6–22)
BUN: 14 mg/dL (ref 7–25)
CHLORIDE: 99 mmol/L (ref 98–110)
CO2: 31 mmol/L (ref 20–32)
CREATININE: 1.04 mg/dL — AB (ref 0.60–0.93)
Calcium: 9.6 mg/dL (ref 8.6–10.4)
GFR, Est African American: 63 mL/min/{1.73_m2} (ref >=60)
GFR, Est Non African American: 54 mL/min/{1.73_m2} — ABNORMAL LOW (ref >=60)
GLUCOSE: 98 mg/dL (ref 65–99)
Potassium: 3.3 mmol/L — ABNORMAL LOW (ref 3.5–5.3)
SODIUM: 140 mmol/L (ref 135–146)

## 2017-07-05 LAB — SEDIMENTATION RATE: Sed Rate: 9 mm/h (ref 0–30)

## 2017-07-05 MED ORDER — TRIAMCINOLONE ACETONIDE 0.5 % EX CREA: 1.0000 "application " | TOPICAL_CREAM | Freq: Two times a day (BID) | CUTANEOUS | 99 refills | Status: DC | PRN

## 2017-07-21 ENCOUNTER — Other Ambulatory Visit: Payer: Self-pay | Admitting: Family Medicine

## 2017-07-21 DIAGNOSIS — Z1239 Encounter for other screening for malignant neoplasm of breast: Secondary | ICD-10-CM

## 2017-07-25 ENCOUNTER — Telehealth: Payer: Self-pay

## 2017-07-25 NOTE — Telephone Encounter (Signed)
Donyelle called and left a message stating the red patches have not resolved. She believes they are a side effect of the Eliquis.

## 2017-07-26 NOTE — Telephone Encounter (Signed)
Okay, I think that that is definitely possible.  Which she be willing to try a different blood thinner?  If so I will can send over new prescription and she can try that one.  I am worried that she may get another blood clot if we do not have her on something but if she is willing to switch that and I recommend that we try it.

## 2017-07-26 NOTE — Telephone Encounter (Signed)
Pt stated that she can keep on taking Eliquis until she comes to see you on June 24th or you can recommend something else, she says whatever you feels she needs to do. She did state that the eliquis is working fine minus the patches. She said that she does have some spots during the day and she'll wake up at 2am itching after her 8pm dose. She also wanted me to let you know that she is taking her potassium again. [place], [place]

## 2017-07-27 MED ORDER — RIVAROXABAN 20 MG PO TABS: 20.0000 mg | ORAL_TABLET | Freq: Every day | ORAL | 5 refills | Status: DC

## 2017-07-27 MED ORDER — DABIGATRAN ETEXILATE MESYLATE 150 MG PO CAPS: 150.0000 mg | ORAL_CAPSULE | Freq: Two times a day (BID) | ORAL | 11 refills | Status: DC

## 2017-07-27 NOTE — Telephone Encounter (Signed)
OK, I am sorry. I updated her allergy list.  New rx sent for Pradaxa

## 2017-07-27 NOTE — Telephone Encounter (Signed)
OK, new script sent for Xarelto.

## 2017-07-27 NOTE — Telephone Encounter (Signed)
Pt advised. She states she has been on the xarelto 20mg  in the past and it caused tachycardia. Pt states a lower dose may be better. Routing for another option. Will add to allergy list.

## 2017-07-27 NOTE — Telephone Encounter (Signed)
Pt called clinic today stating she does want to change from the Eliquis. She wants to be some something similar. She cannot deal with the itching anymore. Will route. [facility] pharmacy on file correct.

## 2017-07-28 NOTE — Telephone Encounter (Signed)
It is up to her.  Certainly if she wants to hold off and stop drinking the lemonade through the weekend and see if her rash improves before switching that is perfectly fine.

## 2017-07-28 NOTE — Telephone Encounter (Signed)
Left detailed VM with update on new Rx. Callback provided for any details/questions.

## 2017-07-28 NOTE — Telephone Encounter (Signed)
Patient states she is not sure if she wants to switch to Pradaxa. She wanted to know if she should stay on Eliquis. Her daughter-in-law suggested the reason she broke out in a rash could be due to the lemonade she has been drinking.

## 2017-07-28 NOTE — Telephone Encounter (Signed)
Pt states she will try no lemonade this weekend and see if it helps. If not, she will let us know and go get the Rx for Pradaxa. No further questions.

## 2017-08-01 NOTE — Telephone Encounter (Signed)
Erika Strong called and left a message stating she has decided to switch to the Pradaxa. She is concerned it may interact with Curcumin/Turmeric. Please advise.

## 2017-08-01 NOTE — Telephone Encounter (Signed)
Please call patient and let her know I checked and I did not see any interactions.  I believe the Pradaxa was Artie sent to her pharmacy so she can pick it up there if she would like.  Beatrice Lecher, MD

## 2017-08-02 NOTE — Telephone Encounter (Signed)
Left VM with update/recommendation.

## 2017-08-15 ENCOUNTER — Encounter: Payer: Self-pay | Admitting: Family Medicine

## 2017-08-15 ENCOUNTER — Ambulatory Visit: Payer: Medicare Other | Admitting: Family Medicine

## 2017-08-15 VITALS — BP 138/76 | HR 74 | Ht 66.0 in | Wt 210.0 lb

## 2017-08-15 DIAGNOSIS — R229 Localized swelling, mass and lump, unspecified: Secondary | ICD-10-CM | POA: Diagnosis not present

## 2017-08-15 DIAGNOSIS — E876 Hypokalemia: Secondary | ICD-10-CM | POA: Diagnosis not present

## 2017-08-15 DIAGNOSIS — I10 Essential (primary) hypertension: Secondary | ICD-10-CM

## 2017-08-15 DIAGNOSIS — Z86718 Personal history of other venous thrombosis and embolism: Secondary | ICD-10-CM

## 2017-08-15 LAB — COMPLETE METABOLIC PANEL WITH GFR
AG RATIO: 1.5 (calc) (ref 1.0–2.5)
ALT: 20 U/L (ref 6–29)
AST: 28 U/L (ref 10–35)
Albumin: 4.1 g/dL (ref 3.6–5.1)
Alkaline phosphatase (APISO): 71 U/L (ref 33–130)
BUN: 14 mg/dL (ref 7–25)
CALCIUM: 10.2 mg/dL (ref 8.6–10.4)
CHLORIDE: 101 mmol/L (ref 98–110)
CO2: 33 mmol/L — ABNORMAL HIGH (ref 20–32)
Creat: 0.84 mg/dL (ref 0.60–0.93)
GFR, EST AFRICAN AMERICAN: 82 mL/min/{1.73_m2} (ref >=60)
GFR, EST NON AFRICAN AMERICAN: 70 mL/min/{1.73_m2} (ref >=60)
GLUCOSE: 92 mg/dL (ref 65–99)
Globulin: 2.8 g/dL (calc) (ref 1.9–3.7)
Potassium: 3.8 mmol/L (ref 3.5–5.3)
Sodium: 141 mmol/L (ref 135–146)
TOTAL PROTEIN: 6.9 g/dL (ref 6.1–8.1)
Total Bilirubin: 0.6 mg/dL (ref 0.2–1.2)

## 2017-08-15 LAB — LIPID PANEL
Cholesterol: 235 mg/dL — ABNORMAL HIGH (ref <200)
HDL: 62 mg/dL (ref >50)
LDL CHOLESTEROL (CALC): 141 mg/dL — AB
NON-HDL CHOLESTEROL (CALC): 173 mg/dL — AB (ref <130)
TRIGLYCERIDES: 184 mg/dL — AB (ref <150)
Total CHOL/HDL Ratio: 3.8 (calc) (ref <5.0)

## 2017-08-15 NOTE — Progress Notes (Signed)
   CC: HTN, rash   HPI:  Hypertension- Pt denies chest pain, SOB, dizziness, or heart palpitations.  Taking meds as directed w/o problems.  Denies medication side effects.    Follow-up rash-she was previously on Eliquis and felt like it was causing a rash.  She went to switch medications to see if that made a difference so we recently switched her to Pradaxa. She says the nodules are much better. The ones on her bach have resolved.   Pt has bruise on L forearm she believes this came from her purse.   DVT-now on Pradaxa.  She is actually been doing really well with it.  He feels like the nodules have improved and in fact she also notices that she is not nearly tach as tachycardic when she goes to walk her dogs.  She is also not had any issues with recurrent swelling recently.  So she is rarely taken her diuretic.  Hypokalemia-she has been taking her potassium daily for a month and is due to recheck her levels.  Past medical history, Surgical history, Family history not pertinant except as noted below, Social history, Allergies, and medications have been entered into the medical record, reviewed, and corrections made.   Review of Systems: No fevers, chills, night sweats, weight loss, chest pain, or shortness of breath.   Objective:    General: Well Developed, well nourished, and in no acute distress.  Neuro: Alert and oriented x3, extra-ocular muscles intact, sensation grossly intact.  HEENT: Normocephalic, atraumatic  Skin: Warm and dry, no rashes. Cardiac: Regular rate and rhythm, no murmurs rubs or gallops, no lower extremity edema.  Respiratory: Clear to auscultation bilaterally. Not using accessory muscles, speaking in full sentences. Skin - erythematous nodule near the elbow on the right arm.    Impression and Recommendations:    HTN - Well controlled. Continue current regimen. Follow up in   6 months.   Rash - nodules consistent with erythema nodosum.  Overall it seems  significantly improved since coming off of the Eliquis but she still has a few nodules.  I will be interested to see if they completely resolve in the next week or 2 if she continues to have a small amount of them.Subjective:    Hypokalemia-due to recheck levels today will call with results and adjust medication if needed.  She will likely need to continue with the potassium.  History of DVT-currently on Pradaxa.  She will need to continue some type of anticoagulation until her oncologist at [facility] says that she can come off.  Because of her cancer she is at higher risk for developing a second clot.

## 2017-08-17 ENCOUNTER — Ambulatory Visit (INDEPENDENT_AMBULATORY_CARE_PROVIDER_SITE_OTHER): Payer: Medicare Other

## 2017-08-17 DIAGNOSIS — Z1231 Encounter for screening mammogram for malignant neoplasm of breast: Secondary | ICD-10-CM | POA: Diagnosis not present

## 2017-08-17 DIAGNOSIS — R928 Other abnormal and inconclusive findings on diagnostic imaging of breast: Secondary | ICD-10-CM | POA: Diagnosis not present

## 2017-08-17 DIAGNOSIS — Z1239 Encounter for other screening for malignant neoplasm of breast: Secondary | ICD-10-CM

## 2017-08-18 ENCOUNTER — Other Ambulatory Visit: Payer: Self-pay | Admitting: Family Medicine

## 2017-08-18 ENCOUNTER — Ambulatory Visit: Payer: Medicare Other

## 2017-08-18 DIAGNOSIS — R928 Other abnormal and inconclusive findings on diagnostic imaging of breast: Secondary | ICD-10-CM

## 2017-08-24 ENCOUNTER — Ambulatory Visit
Admission: RE | Admit: 2017-08-24 | Discharge: 2017-08-24 | Disposition: A | Discharge status: Home / Self Care | Payer: Medicare Other | Source: Ambulatory Visit | Attending: Family Medicine | Admitting: Family Medicine

## 2017-08-24 ENCOUNTER — Other Ambulatory Visit: Payer: Self-pay | Admitting: Family Medicine

## 2017-08-24 DIAGNOSIS — R921 Mammographic calcification found on diagnostic imaging of breast: Secondary | ICD-10-CM

## 2017-08-24 DIAGNOSIS — R928 Other abnormal and inconclusive findings on diagnostic imaging of breast: Secondary | ICD-10-CM

## 2017-09-12 ENCOUNTER — Emergency Department
Admission: EM | Admit: 2017-09-12 | Discharge: 2017-09-12 | Disposition: A | Discharge status: Home / Self Care | Payer: Medicare Other | Source: Home / Self Care | Attending: Family Medicine | Admitting: Family Medicine

## 2017-09-12 ENCOUNTER — Encounter: Payer: Self-pay | Admitting: Emergency Medicine

## 2017-09-12 DIAGNOSIS — N3000 Acute cystitis without hematuria: Secondary | ICD-10-CM

## 2017-09-12 LAB — POCT URINALYSIS DIP (MANUAL ENTRY)
BILIRUBIN UA: NEGATIVE
Glucose, UA: NEGATIVE mg/dL
Ketones, POC UA: NEGATIVE mg/dL
NITRITE UA: NEGATIVE
Protein Ur, POC: NEGATIVE mg/dL
Spec Grav, UA: 1.015 (ref 1.010–1.025)
UROBILINOGEN UA: 0.2 U/dL
pH, UA: 7 (ref 5.0–8.0)

## 2017-09-12 MED ORDER — CIPROFLOXACIN HCL 250 MG PO TABS: 250.0000 mg | ORAL_TABLET | Freq: Two times a day (BID) | ORAL | 0 refills | Status: AC

## 2017-09-12 NOTE — Discharge Instructions (Signed)
°  Please take your antibiotic as prescribed. A urine culture has been sent to check the severity of your urinary infection and to determine if you are on the most appropriate antibiotic. The results should come back within 2-3 days and you will be notified even if no medication change is needed.  Please stay well hydrated and follow up with your family doctor in 1 week if not improving, sooner if worsening.  

## 2017-09-12 NOTE — ED Provider Notes (Signed)
[facility]    CSN: [id number] Arrival date & time: 09/12/17  1055     History   Chief Complaint Chief Complaint  Patient presents with  . Dysuria    HPI Erika Strong is a 72 y.o. female.   HPI Erika Strong is a 72 y.o. female presenting to UC with c/o dysuria and urinary frequency for about 1 week. Mild temporary improvement with Azo last week.  Hx of UTI several years ago. Denies pelvic pain or back pain. Denies fever, chills, n/v/d.    Past Medical History:  Diagnosis Date  . Anemia due to chemotherapy 01/23/2014  . Cancer ([place])   . Depression 10/22/2016  . Diverticulitis 2010  . Endometrial cancer ([place]) 11/02/2010  . Hepatic steatosis 02/12/2014  . Hyperlipidemia    2007  . Hypertension   . Malignant neoplasm of corpus uteri, except isthmus ([place]) 01/21/2011  . Wears glasses     Patient Active Problem List   Diagnosis Date Noted  . Endometrial adenocarcinoma ([place]) 10/29/2016  . Bilateral pulmonary embolism ([place]) 10/29/2016  . History of deep venous thrombosis (DVT) of distal vein of left lower extremity 10/29/2016  . Depression 10/22/2016  . Primary osteoarthritis of right hip 07/23/2016  . Hepatic steatosis 02/12/2014  . Anemia due to chemotherapy 01/23/2014  . Chemotherapy-induced peripheral neuropathy ([place]) 01/23/2014  . Metastatic cancer to pelvis ([place]) 06/23/2013  . Trigger finger, acquired 08/11/2012  . Degenerative disc disease, cervical 12/07/2011  . Overweight(278.02) 09/16/2011  . Metabolic syndrome X 58/10/9831  . Malignant neoplasm of corpus uteri, except isthmus ([place]) 01/21/2011  . Endometrial cancer ([place]) 11/02/2010  . Venous stasis 07/27/2010  . PROPTOSIS 01/26/2010  . OSTEOPENIA 06/04/2009  . DIVERTICULITIS, COLON 11/25/2008  . Lumbar spondylosis 12/12/2007  . HYPERTENSION, BENIGN 07/24/2007  . HLD (hyperlipidemia) 04/14/2007  . POSTMENOPAUSAL STATUS 04/13/2007    Past Surgical History:  Procedure Laterality Date  .  ABDOMINAL HYSTERECTOMY  11/24/2010   RTLH, BSO, RPLND, LPLNS  . GONADECTOMY AND HYSTERECTOMY  11/24/2010   Endometrial cancer, performed by Dr. Janie Morning  . MOLE REMOVAL  Nov 2012    OB History    Gravida  1   Para  1   Term  1   Preterm      AB      Living  1     SAB      TAB      Ectopic      Multiple      Live Births               Home Medications    Prior to Admission medications   Medication Sig Start Date End Date Taking? Authorizing Provider  acyclovir ointment (ZOVIRAX) 5 % Apply to fever blister 5 times a day 07/30/13   Livesay, Tamala Julian, MD  Ascorbic Acid (VITAMIN C CR) 1000 MG TBCR Take 1,000 mg by mouth daily.    [provider]  B Complex-C (B-COMPLEX WITH VITAMIN C) tablet Take 1 tablet by mouth daily.    [provider]  chlorthalidone (HYGROTON) 50 MG tablet TAKE 1 TABLET(50 MG) BY MOUTH DAILY 04/22/17   Hali Marry, MD  ciprofloxacin (CIPRO) 250 MG tablet Take 1 tablet (250 mg total) by mouth every 12 (twelve) hours for 3 days. 09/12/17 09/15/17  Noe Gens, PA-C  dabigatran (PRADAXA) 150 MG CAPS capsule Take 1 capsule (150 mg total) by mouth 2 (two) times daily. 07/27/17   Beatrice Lecher  D, MD  furosemide (LASIX) 40 MG tablet TAKE 1 TABLET(40 MG) BY MOUTH DAILY AS NEEDED FOR SWELLING 06/21/17   Hali Marry, MD  hydrOXYzine (ATARAX/VISTARIL) 25 MG tablet TK 1 OR 2 TS PO QHS 07/20/17   [provider]  letrozole (FEMARA) 2.5 MG tablet Take 2.5 mg by mouth.    [provider]  OVER THE COUNTER MEDICATION Take 1 tablet by mouth daily. Curamin    [provider]  OVER THE COUNTER MEDICATION Take 3 tablets by mouth daily. New Chapter Bone Strength (calcium product)    [provider]  OVER THE COUNTER MEDICATION Take 1 packet by mouth 2 (two) times daily. Preston Fleeting Vitamin Pak (contains fish oil, potassium and multi vitamins)    [provider]  OVER THE COUNTER  MEDICATION Take 3 tablets by mouth 3 (three) times daily. Intestinal Sooth and Build    [provider]  OVER THE COUNTER MEDICATION Take 2 tablets by mouth 2 (two) times daily. Stress J    [provider]  OVER THE COUNTER MEDICATION Take 1 tablet by mouth daily. Liver Care    [provider]  OVER THE COUNTER MEDICATION Take 10 mLs by mouth daily with breakfast. Liquid chlorophyl    [provider]  OVER THE COUNTER MEDICATION Take 30 mLs by mouth daily with breakfast. Flax Seed Oil    [provider]  OVER THE COUNTER MEDICATION Take 2 tablets by mouth 2 (two) times daily. Stress Gaurd    [provider]  potassium chloride (MICRO-K) 10 MEQ CR capsule TAKE 1 CAPSULE(10 MEQ) BY MOUTH DAILY 08/18/17   Hali Marry, MD  Probiotic Product (PROBIOTIC DAILY) CAPS Take 1 capsule by mouth daily.    [provider]  traZODone (DESYREL) 50 MG tablet Take 0.5-1 tablets (25-50 mg total) by mouth at bedtime as needed for sleep. 07/05/14   Hali Marry, MD  triamcinolone cream (KENALOG) 0.5 % Apply 1 application topically 2 (two) times daily as needed. 07/05/17   Hali Marry, MD  Ubiquinol 100 MG CAPS Take 100 mg by mouth daily with breakfast.     [provider]  UNABLE TO FIND Take by mouth.    [provider]    Family History Family History  Problem Relation Age of Onset  . Osteoporosis Mother   . Rheum arthritis Mother   . Cancer Mother   . Lung cancer Father        smoker  . Cancer Father     Social History Social History   Tobacco Use  . Smoking status: Never Smoker  . Smokeless tobacco: Never Used  Substance Use Topics  . Alcohol use: No  . Drug use: No     Allergies   Granix [filgrastim]; Atorvastatin; Fish allergy; Lisinopril; Pravastatin sodium; Simvastatin; Xarelto [rivaroxaban]; Eliquis [apixaban]; and Keflex [cephalexin]   Review of Systems Review of Systems    Constitutional: Negative for chills and fever.  Gastrointestinal: Negative for abdominal pain, diarrhea, nausea and vomiting.  Genitourinary: Positive for dysuria, frequency and urgency. Negative for hematuria.  Musculoskeletal: Negative for back pain and myalgias.     Physical Exam Triage Vital Signs ED Triage Vitals  Enc Vitals Group     BP 09/12/17 1125 132/83     Pulse Rate 09/12/17 1125 80     Resp --      Temp 09/12/17 1125 98.3 F (36.8 C)     Temp Source 09/12/17 1125  Oral     SpO2 09/12/17 1125 97 %     Weight 09/12/17 1126 209 lb (94.8 kg)     Height --      Head Circumference --      Peak Flow --      Pain Score 09/12/17 1126 0     Pain Loc --      Pain Edu? --      Excl. in [place]? --    No data found.  Updated Vital Signs BP 132/83 (BP Location: Right Arm)   Pulse 80   Temp 98.3 F (36.8 C) (Oral)   Wt 209 lb (94.8 kg)   SpO2 97%   BMI 33.73 kg/m   Visual Acuity Right Eye Distance:   Left Eye Distance:   Bilateral Distance:    Right Eye Near:   Left Eye Near:    Bilateral Near:     Physical Exam  Constitutional: She is oriented to person, place, and time. She appears well-developed and well-nourished. No distress.  HENT:  Head: Normocephalic and atraumatic.  Mouth/Throat: Oropharynx is clear and moist.  Eyes: EOM are normal.  Neck: Normal range of motion.  Cardiovascular: Normal rate and regular rhythm.  Pulmonary/Chest: Effort normal and breath sounds normal. No stridor. No respiratory distress. She has no wheezes. She has no rales.  Abdominal: Soft. She exhibits no distension. There is no tenderness. There is no CVA tenderness.  Musculoskeletal: Normal range of motion.  Neurological: She is alert and oriented to person, place, and time.  Skin: Skin is warm and dry. She is not diaphoretic.  Psychiatric: She has a normal mood and affect. Her behavior is normal.  Nursing note and vitals reviewed.    UC Treatments / Results  Labs (all labs  ordered are listed, but only abnormal results are displayed) Labs Reviewed  POCT URINALYSIS DIP (MANUAL ENTRY) - Abnormal; Notable for the following components:      Result Value   Blood, UA small (*)    Leukocytes, UA Large (3+) (*)    All other components within normal limits  URINE CULTURE  URINALYSIS, ROUTINE W REFLEX MICROSCOPIC    EKG None  Radiology No results found.  Procedures Procedures (including critical care time)  Medications Ordered in UC Medications - No data to display  Initial Impression / Assessment and Plan / UC Course  I have reviewed the triage vital signs and the nursing notes.  Pertinent labs & imaging results that were available during my care of the patient were reviewed by me and considered in my medical decision making (see chart for details).    UA c/w UTI Culture sent Will start on Cipro. Pt has had this in the past w/o side effects. She reports hives with keflex.   Final Clinical Impressions(s) / UC Diagnoses   Final diagnoses:  Acute cystitis without hematuria     Discharge Instructions      Please take your antibiotic as prescribed. A urine culture has been sent to check the severity of your urinary infection and to determine if you are on the most appropriate antibiotic. The results should come back within 2-3 days and you will be notified even if no medication change is needed.  Please stay well hydrated and follow up with your family doctor in 1 week if not improving, sooner if worsening.    ED Prescriptions    Medication Sig Dispense Auth. Provider   ciprofloxacin (CIPRO) 250 MG tablet Take 1 tablet (250 mg  total) by mouth every 12 (twelve) hours for 3 days. 6 tablet Noe Gens, PA-C     Controlled Substance Prescriptions [place] Controlled Substance Registry consulted? Not Applicable   Tyrell Antonio 09/12/17 1341

## 2017-09-12 NOTE — ED Triage Notes (Signed)
Pt c/o dysuria and frequency x1 week. Denies pelvic or back pain.

## 2017-09-14 ENCOUNTER — Ambulatory Visit: Payer: Medicare Other | Admitting: Family Medicine

## 2017-09-14 LAB — URINE CULTURE
MICRO NUMBER:: 90864265
SPECIMEN QUALITY:: ADEQUATE

## 2017-09-15 ENCOUNTER — Telehealth: Payer: Self-pay

## 2017-09-15 NOTE — Telephone Encounter (Signed)
Attempted to call, mail box full. 

## 2017-09-16 ENCOUNTER — Telehealth: Payer: Self-pay | Admitting: Emergency Medicine

## 2017-09-16 NOTE — Telephone Encounter (Signed)
Spoke with husband of patient who says she is feeling fine.

## 2017-09-19 ENCOUNTER — Other Ambulatory Visit: Payer: Self-pay | Admitting: Family Medicine

## 2017-09-26 ENCOUNTER — Encounter: Payer: Self-pay | Admitting: Family Medicine

## 2017-09-26 ENCOUNTER — Ambulatory Visit: Payer: Medicare Other | Admitting: Family Medicine

## 2017-09-26 VITALS — BP 135/73 | HR 78 | Ht 66.0 in | Wt 211.0 lb

## 2017-09-26 DIAGNOSIS — R002 Palpitations: Secondary | ICD-10-CM | POA: Diagnosis not present

## 2017-09-26 DIAGNOSIS — R3989 Other symptoms and signs involving the genitourinary system: Secondary | ICD-10-CM | POA: Diagnosis not present

## 2017-09-26 DIAGNOSIS — E876 Hypokalemia: Secondary | ICD-10-CM | POA: Diagnosis not present

## 2017-09-26 LAB — BASIC METABOLIC PANEL WITH GFR
BUN: 14 mg/dL (ref 7–25)
CHLORIDE: 99 mmol/L (ref 98–110)
CO2: 30 mmol/L (ref 20–32)
Calcium: 9.8 mg/dL (ref 8.6–10.4)
Creat: 0.78 mg/dL (ref 0.60–0.93)
GFR, EST AFRICAN AMERICAN: 89 mL/min/{1.73_m2} (ref >=60)
GFR, EST NON AFRICAN AMERICAN: 77 mL/min/{1.73_m2} (ref >=60)
Glucose, Bld: 89 mg/dL (ref 65–99)
POTASSIUM: 3.6 mmol/L (ref 3.5–5.3)
Sodium: 139 mmol/L (ref 135–146)

## 2017-09-26 LAB — POCT URINALYSIS DIPSTICK
BILIRUBIN UA: NEGATIVE
Blood, UA: NEGATIVE
Glucose, UA: NEGATIVE
KETONES UA: NEGATIVE
Leukocytes, UA: NEGATIVE
NITRITE UA: NEGATIVE
PH UA: 7.5 (ref 5.0–8.0)
PROTEIN UA: NEGATIVE
SPEC GRAV UA: 1.02 (ref 1.010–1.025)
UROBILINOGEN UA: 0.2 U/dL

## 2017-09-26 NOTE — Progress Notes (Signed)
Subjective:    Patient ID: Erika Strong, female    DOB: [birth year], 72 y.o.   MRN: [id number]  HPI 72 year old female with a history of endometrial adenocarcinoma comes in today to follow-up for recent UTI that was diagnosed at urgent care on July 22.  She initially presented with some dysuria and urinary frequency that she had for about a week.  She was treated with Cipro for 3 days.  She does feel like her symptoms have resolved.  Culture was positive for E. coli and was sensitive to ciprofloxacin.  Recent urinary tract infection-resolved.  Palpitations -she still having occasional palpitations that are brief more so when she gets worried or upset.  She says on her Fitbit though her pulse tends to stay under 100 when it happens it is not going high.  The only time she notices her heart rate increasing is with exercise.  She tries to walk about three quarters of a mile a day.  Early with exercise her heart rate will get to about 115.  Hypokalemia f/u - she has been taking her potassium every other day for 3 weeks.  She was on daily dosing before.     Review of Systems  BP 135/73   Pulse 78   Ht 5\' 6"  (1.676 m)   Wt 211 lb (95.7 kg)   SpO2 99%   BMI 34.06 kg/m     Allergies  Allergen Reactions  . Granix [Filgrastim]     Shot on [employer]. Developed chills and fever  . Atorvastatin Other (See Comments)     myalgias  . Fish Allergy Nausea And Vomiting  . Lisinopril Other (See Comments)     Cough  . Pravastatin Sodium Other (See Comments)     myalgias  . Simvastatin Other (See Comments)     myalgias  . Xarelto [Rivaroxaban] Other (See Comments)    Tachycardia  . Eliquis [Apixaban] Rash  . Keflex [Cephalexin] Rash    Past Medical History:  Diagnosis Date  . Anemia due to chemotherapy 01/23/2014  . Cancer ([place])   . Depression 10/22/2016  . Diverticulitis 2010  . Endometrial cancer ([place]) 11/02/2010  . Hepatic steatosis 02/12/2014  . Hyperlipidemia    2007  . Hypertension   .  Malignant neoplasm of corpus uteri, except isthmus ([place]) 01/21/2011  . Wears glasses     Past Surgical History:  Procedure Laterality Date  . ABDOMINAL HYSTERECTOMY  11/24/2010   RTLH, BSO, RPLND, LPLNS  . GONADECTOMY AND HYSTERECTOMY  11/24/2010   Endometrial cancer, performed by Dr. Janie Morning  . MOLE REMOVAL  Nov 2012    Social History   Socioeconomic History  . Marital status: Married    Spouse name: [name]  . Number of children: 1   . Years of education: Assoc degr  . Highest education level: Not on file  Occupational History  . Occupation: Retail buyer: [employer]  . Financial resource strain: Not on file  . Food insecurity:    Worry: Not on file    Inability: Not on file  . Transportation needs:    Medical: Not on file    Non-medical: Not on file  Tobacco Use  . Smoking status: Never Smoker  . Smokeless tobacco: Never Used  Substance and Sexual Activity  . Alcohol use: No  . Drug use: No  . Sexual activity: Not Currently    Comment: cosmetologist, teaches, associate degree, married, regularly exercises.  Lifestyle  . Physical activity:    Days per week: Not on file    Minutes per session: Not on file  . Stress: Not on file  Relationships  . Social connections:    Talks on phone: Not on file    Gets together: Not on file    Attends religious service: Not on file    Active member of club or organization: Not on file    Attends meetings of clubs or organizations: Not on file    Relationship status: Not on file  . Intimate partner violence:    Fear of current or ex partner: Not on file    Emotionally abused: Not on file    Physically abused: Not on file    Forced sexual activity: Not on file  Other Topics Concern  . Not on file  Social History Narrative   Walks daily for exercise.     Family History  Problem Relation Age of Onset  . Osteoporosis Mother   . Rheum arthritis Mother   . Cancer  Mother   . Lung cancer Father        smoker  . Cancer Father     Outpatient Encounter Medications as of 09/26/2017  Medication Sig  . acyclovir ointment (ZOVIRAX) 5 % Apply to fever blister 5 times a day  . Ascorbic Acid (VITAMIN C CR) 1000 MG TBCR Take 1,000 mg by mouth daily.  . B Complex-C (B-COMPLEX WITH VITAMIN C) tablet Take 1 tablet by mouth daily.  . chlorthalidone (HYGROTON) 50 MG tablet TAKE 1 TABLET(50 MG) BY MOUTH DAILY  . dabigatran (PRADAXA) 150 MG CAPS capsule Take 1 capsule (150 mg total) by mouth 2 (two) times daily.  . furosemide (LASIX) 40 MG tablet TAKE 1 TABLET(40 MG) BY MOUTH DAILY AS NEEDED FOR SWELLING  . letrozole (FEMARA) 2.5 MG tablet Take 2.5 mg by mouth.  Marland Kitchen OVER THE COUNTER MEDICATION Take 1 tablet by mouth daily. Curamin  . OVER THE COUNTER MEDICATION Take 3 tablets by mouth daily. New Chapter Bone Strength (calcium product)  . OVER THE COUNTER MEDICATION Take 1 packet by mouth 2 (two) times daily. Preston Fleeting Vitamin Pak (contains fish oil, potassium and multi vitamins)  . OVER THE COUNTER MEDICATION Take 3 tablets by mouth 3 (three) times daily. Intestinal Sooth and Build  . OVER THE COUNTER MEDICATION Take 2 tablets by mouth 2 (two) times daily. Stress J  . OVER THE COUNTER MEDICATION Take 1 tablet by mouth daily. Liver Care  . OVER THE COUNTER MEDICATION Take 10 mLs by mouth daily with breakfast. Liquid chlorophyl  . OVER THE COUNTER MEDICATION Take 30 mLs by mouth daily with breakfast. Flax Seed Oil  . OVER THE COUNTER MEDICATION Take 2 tablets by mouth 2 (two) times daily. Stress Gaurd  . potassium chloride (MICRO-K) 10 MEQ CR capsule TAKE 1 CAPSULE(10 MEQ) BY MOUTH DAILY  . Probiotic Product (PROBIOTIC DAILY) CAPS Take 1 capsule by mouth daily.  . traZODone (DESYREL) 50 MG tablet Take 0.5-1 tablets (25-50 mg total) by mouth at bedtime as needed for sleep.  Marland Kitchen triamcinolone cream (KENALOG) 0.5 % Apply 1 application topically 2 (two) times daily as  needed.  Marland Kitchen Ubiquinol 100 MG CAPS Take 100 mg by mouth daily with breakfast.   . UNABLE TO FIND Take by mouth.  . [DISCONTINUED] hydrOXYzine (ATARAX/VISTARIL) 25 MG tablet TK 1 OR 2 TS PO QHS   No facility-administered encounter medications on file as of 09/26/2017.  Objective:   Physical Exam  Constitutional: She is oriented to person, place, and time. She appears well-developed and well-nourished.  HENT:  Head: Normocephalic and atraumatic.  Eyes: Conjunctivae and EOM are normal.  Cardiovascular: Normal rate.  Pulmonary/Chest: Effort normal.  Neurological: She is alert and oriented to person, place, and time.  Skin: Skin is dry. No pallor.  Psychiatric: She has a normal mood and affect. Her behavior is normal.  Vitals reviewed.       Assessment & Plan:  Palpitations -offered to do a cardiac monitor but she declined.  Though it is reassuring if her Fitbit pulse is showing that it is less than 100 when she feels these palpitations so it is less likely to be A. fib etc.  Though the device could also be making an air so encouraged her to call me if symptoms persist or if she notices that they were coming more frequent or if she notices any near-syncope   Hypokalemia - due to recheck potassium. Now on replacement every other day.    Recent UTI - UA is normal; today and her sxs have resolved.

## 2017-09-27 ENCOUNTER — Other Ambulatory Visit: Payer: Self-pay | Admitting: *Deleted

## 2017-09-27 DIAGNOSIS — E876 Hypokalemia: Secondary | ICD-10-CM

## 2017-10-19 ENCOUNTER — Other Ambulatory Visit: Payer: Self-pay | Admitting: Family Medicine

## 2017-11-15 ENCOUNTER — Ambulatory Visit (INDEPENDENT_AMBULATORY_CARE_PROVIDER_SITE_OTHER): Payer: Medicare Other | Admitting: Family Medicine

## 2017-11-15 DIAGNOSIS — Z23 Encounter for immunization: Secondary | ICD-10-CM

## 2017-11-15 NOTE — Progress Notes (Signed)
Flu shot given

## 2017-12-20 ENCOUNTER — Encounter: Payer: Self-pay | Admitting: Physician Assistant

## 2017-12-20 ENCOUNTER — Ambulatory Visit (INDEPENDENT_AMBULATORY_CARE_PROVIDER_SITE_OTHER): Payer: Medicare Other | Admitting: Physician Assistant

## 2017-12-20 VITALS — BP 142/67 | HR 72 | Temp 98.5°F | Ht 66.0 in | Wt 209.0 lb

## 2017-12-20 DIAGNOSIS — N3001 Acute cystitis with hematuria: Secondary | ICD-10-CM | POA: Diagnosis not present

## 2017-12-20 DIAGNOSIS — Z87440 Personal history of urinary (tract) infections: Secondary | ICD-10-CM | POA: Diagnosis not present

## 2017-12-20 DIAGNOSIS — R350 Frequency of micturition: Secondary | ICD-10-CM | POA: Diagnosis not present

## 2017-12-20 LAB — POCT URINALYSIS DIPSTICK
BILIRUBIN UA: NEGATIVE
Glucose, UA: NEGATIVE
KETONES UA: NEGATIVE
NITRITE UA: NEGATIVE
PH UA: 7 (ref 5.0–8.0)
Protein, UA: NEGATIVE
SPEC GRAV UA: 1.015 (ref 1.010–1.025)
UROBILINOGEN UA: 0.2 U/dL

## 2017-12-20 MED ORDER — NITROFURANTOIN MONOHYD MACRO 100 MG PO CAPS: 100.0000 mg | ORAL_CAPSULE | Freq: Two times a day (BID) | ORAL | 0 refills | Status: DC

## 2017-12-20 NOTE — Patient Instructions (Signed)

## 2017-12-20 NOTE — Progress Notes (Signed)
Subjective:    Patient ID: Erika Strong, female    DOB: Jul 11, 1945, 72 y.o.   MRN: [id number]  HPI  Pt is a 72 yo female with hx of UTI's who presents to the clinic with increased urinary frequency and pressure with some discomfort with urination. Last UTI was in July 2019 with e.coli. Her most recent symptoms have been present for the last 2 weeks. No fever, nausea, vomiting, diarrhea, or flank pain. No vaginal itching or discharge. Not tried anything to make it better.   .. Active Ambulatory Problems    Diagnosis Date Noted  . HLD (hyperlipidemia) 04/14/2007  . PROPTOSIS 01/26/2010  . HYPERTENSION, BENIGN 07/24/2007  . DIVERTICULITIS, COLON 11/25/2008  . POSTMENOPAUSAL STATUS 04/13/2007  . Lumbar spondylosis 12/12/2007  . OSTEOPENIA 06/04/2009  . Venous stasis 07/27/2010  . Endometrial cancer ([place]) 11/02/2010  . Malignant neoplasm of corpus uteri, except isthmus ([place]) 01/21/2011  . Metabolic syndrome X 30/86/5784  . Overweight(278.02) 09/16/2011  . Degenerative disc disease, cervical 12/07/2011  . Trigger finger, acquired 08/11/2012  . Metastatic cancer to pelvis ([place]) 06/23/2013  . Anemia due to chemotherapy 01/23/2014  . Chemotherapy-induced peripheral neuropathy ([place]) 01/23/2014  . Hepatic steatosis 02/12/2014  . Primary osteoarthritis of right hip 07/23/2016  . Endometrial adenocarcinoma ([place]) 10/29/2016  . Bilateral pulmonary embolism ([place]) 10/29/2016  . History of deep venous thrombosis (DVT) of distal vein of left lower extremity 10/29/2016  . Depression 10/22/2016   Resolved Ambulatory Problems    Diagnosis Date Noted  . URI 12/08/2009  . Need for prophylactic vaccination and inoculation against influenza 12/07/2012  . SIRS (systemic inflammatory response syndrome) ([place]) 01/24/2014  . Hypokalemia 01/24/2014  . Mild dehydration 01/24/2014  . Fever   . Hypersensitivity reaction 03/11/2014   Past Medical History:  Diagnosis Date  . Cancer ([place])   .  Diverticulitis 2010  . Hyperlipidemia   . Hypertension   . Wears glasses       Review of Systems See HPI.     Objective:   Physical Exam  Constitutional: She is oriented to person, place, and time. She appears well-developed and well-nourished.  HENT:  Head: Normocephalic and atraumatic.  Cardiovascular: Normal rate and regular rhythm.  Pulmonary/Chest: Effort normal and breath sounds normal.  No CVA tenderness.   Abdominal: Soft. Bowel sounds are normal. She exhibits no distension and no mass. There is no tenderness. There is no rebound and no guarding.  Neurological: She is alert and oriented to person, place, and time.  Psychiatric: She has a normal mood and affect. Her behavior is normal.          Assessment & Plan:  Marland KitchenMarland KitchenDiagnoses and all orders for this visit:  Acute cystitis with hematuria -     nitrofurantoin, macrocrystal-monohydrate, (MACROBID) 100 MG capsule; Take 1 capsule (100 mg total) by mouth 2 (two) times daily.  Urinary frequency -     POCT Urinalysis Dipstick -     Urine Culture  History of UTI   .Marland Kitchen Results for orders placed or performed in visit on 12/20/17  POCT Urinalysis Dipstick  Result Value Ref Range   Color, UA yellow    Clarity, UA cloudy    Glucose, UA Negative Negative   Bilirubin, UA negative    Ketones, UA negative    Spec Grav, UA 1.015 1.010 - 1.025   Blood, UA trace-lysed    pH, UA 7.0 5.0 - 8.0   Protein, UA Negative Negative   Urobilinogen, UA 0.2  0.2 or 1.0 E.U./dL   Nitrite, UA negative    Leukocytes, UA Moderate (2+) (A) Negative   Appearance     Odor     Leukocytes and blood positive. Will culture. Due to hx and symptoms will treat with macrobid. HO for symptomatic care given. Follow up as needed.

## 2017-12-22 LAB — URINE CULTURE
MICRO NUMBER:: 91303253
SPECIMEN QUALITY:: ADEQUATE

## 2017-12-22 NOTE — Progress Notes (Signed)
macrobid should treat. Follow up if not feeling any better.

## 2017-12-22 NOTE — Progress Notes (Signed)
Call pt: you do have UTI with e.coli. Will let you know once sensitivities are back to confirm abx chosen will work.

## 2018-02-27 ENCOUNTER — Encounter: Payer: Self-pay | Admitting: Family Medicine

## 2018-02-27 ENCOUNTER — Ambulatory Visit (INDEPENDENT_AMBULATORY_CARE_PROVIDER_SITE_OTHER): Payer: Medicare Other | Admitting: Family Medicine

## 2018-02-27 VITALS — BP 122/66 | HR 58 | Ht 66.0 in | Wt 213.0 lb

## 2018-02-27 DIAGNOSIS — C541 Malignant neoplasm of endometrium: Secondary | ICD-10-CM | POA: Diagnosis not present

## 2018-02-27 DIAGNOSIS — Z86718 Personal history of other venous thrombosis and embolism: Secondary | ICD-10-CM

## 2018-02-27 DIAGNOSIS — M25551 Pain in right hip: Secondary | ICD-10-CM | POA: Diagnosis not present

## 2018-02-27 DIAGNOSIS — I10 Essential (primary) hypertension: Secondary | ICD-10-CM | POA: Diagnosis not present

## 2018-02-27 DIAGNOSIS — M7989 Other specified soft tissue disorders: Secondary | ICD-10-CM

## 2018-02-27 NOTE — Progress Notes (Signed)
Subjective:    CC: BP check   HPI:  Hypertension- Pt denies chest pain, SOB, dizziness, or heart palpitations.  Taking meds as directed w/o problems.  Denies medication side effects.    Hx of DVT - she is dong well on her Pradaxa with no side effects.  thsi has been much better.    Occasional pain in her right hip but says she is dealing with it financial to sit down and rest when it bothers her and that usually works she does not really take any medication or Tylenol for it.  She said she was told that when she had radiation for the endometrial cancer that she would probably have some problems with her right hip because that was 1 of the areas that they treated.  Reports during the winter months her lower extremity swelling is much better.  She still takes her chlorthalidone daily but has not needed to take her extra diuretic in a couple of months.  She is been trying to purposely walk and exercise daily.  For labs.  Past medical history, Surgical history, Family history not pertinant except as noted below, Social history, Allergies, and medications have been entered into the medical record, reviewed, and corrections made.   Review of Systems: No fevers, chills, night sweats, weight loss, chest pain, or shortness of breath.   Objective:    General: Well Developed, well nourished, and in no acute distress.  Neuro: Alert and oriented x3, extra-ocular muscles intact, sensation grossly intact.  HEENT: Normocephalic, atraumatic  Skin: Warm and dry, no rashes. Cardiac: Regular rate and rhythm, no murmurs rubs or gallops, no lower extremity edema.  Respiratory: Clear to auscultation bilaterally. Not using accessory muscles, speaking in full sentences.   Impression and Recommendations:    HTN - Well controlled. Continue current regimen. Follow up in  6 months.  Due for labs.  Right hip pain-likely secondary to arthritis and radiation.  Right now she just sits and rests when it bothers her  and that seems to help.  No further work-up needed at this point in time and pain is manageable.  Lower extremity swelling-much improved.  Being able to maintain which is chlorthalidone and daily walking and exercise.  History of DVT-continue with Pradaxa.  Hyperlipidemia-she would like to have her lipids rechecked today since she is fasting.

## 2018-02-28 LAB — COMPLETE METABOLIC PANEL WITHOUT GFR
AG Ratio: 1.8 (calc) (ref 1.0–2.5)
ALT: 20 U/L (ref 6–29)
AST: 25 U/L (ref 10–35)
Albumin: 4.2 g/dL (ref 3.6–5.1)
Alkaline phosphatase (APISO): 63 U/L (ref 33–130)
BUN: 11 mg/dL (ref 7–25)
CO2: 32 mmol/L (ref 20–32)
Calcium: 9.8 mg/dL (ref 8.6–10.4)
Chloride: 100 mmol/L (ref 98–110)
Creat: 0.74 mg/dL (ref 0.60–0.93)
GFR, Est African American: 95 mL/min/{1.73_m2}
GFR, Est Non African American: 82 mL/min/{1.73_m2}
Globulin: 2.3 g/dL (ref 1.9–3.7)
Glucose, Bld: 86 mg/dL (ref 65–99)
Potassium: 3.5 mmol/L (ref 3.5–5.3)
Sodium: 141 mmol/L (ref 135–146)
Total Bilirubin: 0.6 mg/dL (ref 0.2–1.2)
Total Protein: 6.5 g/dL (ref 6.1–8.1)

## 2018-02-28 LAB — CBC
HCT: 42.7 % (ref 35.0–45.0)
Hemoglobin: 14.3 g/dL (ref 11.7–15.5)
MCH: 29.9 pg (ref 27.0–33.0)
MCHC: 33.5 g/dL (ref 32.0–36.0)
MCV: 89.3 fL (ref 80.0–100.0)
MPV: 11.1 fL (ref 7.5–12.5)
Platelets: 272 10*3/uL (ref 140–400)
RBC: 4.78 Million/uL (ref 3.80–5.10)
RDW: 12.5 % (ref 11.0–15.0)
WBC: 8.1 10*3/uL (ref 3.8–10.8)

## 2018-02-28 LAB — LIPID PANEL
Cholesterol: 238 mg/dL — ABNORMAL HIGH
HDL: 56 mg/dL
LDL Cholesterol (Calc): 153 mg/dL — ABNORMAL HIGH
Non-HDL Cholesterol (Calc): 182 mg/dL — ABNORMAL HIGH
Total CHOL/HDL Ratio: 4.3 (calc)
Triglycerides: 159 mg/dL — ABNORMAL HIGH

## 2018-02-28 LAB — TSH: TSH: 2.7 m[IU]/L (ref 0.40–4.50)

## 2018-03-01 ENCOUNTER — Other Ambulatory Visit: Payer: Self-pay | Admitting: Family Medicine

## 2018-03-01 ENCOUNTER — Ambulatory Visit
Admission: RE | Admit: 2018-03-01 | Discharge: 2018-03-01 | Disposition: A | Discharge status: Home / Self Care | Payer: Medicare Other | Source: Ambulatory Visit | Attending: Family Medicine | Admitting: Family Medicine

## 2018-03-01 DIAGNOSIS — R921 Mammographic calcification found on diagnostic imaging of breast: Secondary | ICD-10-CM

## 2018-03-27 ENCOUNTER — Encounter: Payer: Self-pay | Admitting: Physician Assistant

## 2018-03-27 ENCOUNTER — Ambulatory Visit (INDEPENDENT_AMBULATORY_CARE_PROVIDER_SITE_OTHER): Payer: Medicare Other | Admitting: Physician Assistant

## 2018-03-27 VITALS — BP 136/58 | HR 97 | Temp 99.1°F | Ht 66.0 in | Wt 215.0 lb

## 2018-03-27 DIAGNOSIS — R109 Unspecified abdominal pain: Secondary | ICD-10-CM | POA: Diagnosis not present

## 2018-03-27 DIAGNOSIS — R112 Nausea with vomiting, unspecified: Secondary | ICD-10-CM | POA: Diagnosis not present

## 2018-03-27 DIAGNOSIS — R509 Fever, unspecified: Secondary | ICD-10-CM | POA: Diagnosis not present

## 2018-03-27 DIAGNOSIS — N3000 Acute cystitis without hematuria: Secondary | ICD-10-CM

## 2018-03-27 DIAGNOSIS — R350 Frequency of micturition: Secondary | ICD-10-CM

## 2018-03-27 LAB — POCT URINALYSIS DIPSTICK
Bilirubin, UA: NEGATIVE
Glucose, UA: NEGATIVE
KETONES UA: NEGATIVE
NITRITE UA: NEGATIVE
PH UA: 7.5 (ref 5.0–8.0)
PROTEIN UA: NEGATIVE
RBC UA: NEGATIVE
SPEC GRAV UA: 1.02 (ref 1.010–1.025)
UROBILINOGEN UA: 0.2 U/dL

## 2018-03-27 LAB — POCT INFLUENZA A/B
INFLUENZA A, POC: NEGATIVE
Influenza B, POC: NEGATIVE

## 2018-03-27 MED ORDER — NITROFURANTOIN MONOHYD MACRO 100 MG PO CAPS: 100.0000 mg | ORAL_CAPSULE | Freq: Two times a day (BID) | ORAL | 0 refills | Status: DC

## 2018-03-27 NOTE — Patient Instructions (Signed)
Urinary Tract Infection, Adult A urinary tract infection (UTI) is an infection of any part of the urinary tract. The urinary tract includes:  The kidneys.  The ureters.  The bladder.  The urethra. These organs make, store, and get rid of pee (urine) in the body. What are the causes? This is caused by germs (bacteria) in your genital area. These germs grow and cause swelling (inflammation) of your urinary tract. What increases the risk? You are more likely to develop this condition if:  You have a small, thin tube (catheter) to drain pee.  You cannot control when you pee or poop (incontinence).  You are female, and: ? You use these methods to prevent pregnancy: ? A medicine that kills sperm (spermicide). ? A device that blocks sperm (diaphragm). ? You have low levels of a female hormone (estrogen). ? You are pregnant.  You have genes that add to your risk.  You are sexually active.  You take antibiotic medicines.  You have trouble peeing because of: ? A prostate that is bigger than normal, if you are female. ? A blockage in the part of your body that drains pee from the bladder (urethra). ? A kidney stone. ? A nerve condition that affects your bladder (neurogenic bladder). ? Not getting enough to drink. ? Not peeing often enough.  You have other conditions, such as: ? Diabetes. ? A weak disease-fighting system (immune system). ? Sickle cell disease. ? Gout. ? Injury of the spine. What are the signs or symptoms? Symptoms of this condition include:  Needing to pee right away (urgently).  Peeing often.  Peeing small amounts often.  Pain or burning when peeing.  Blood in the pee.  Pee that smells bad or not like normal.  Trouble peeing.  Pee that is cloudy.  Fluid coming from the vagina, if you are female.  Pain in the belly or lower back. Other symptoms include:  Throwing up (vomiting).  No urge to eat.  Feeling mixed up (confused).  Being tired  and grouchy (irritable).  A fever.  Watery poop (diarrhea). How is this treated? This condition may be treated with:  Antibiotic medicine.  Other medicines.  Drinking enough water. Follow these instructions at home:  Medicines  Take over-the-counter and prescription medicines only as told by your doctor.  If you were prescribed an antibiotic medicine, take it as told by your doctor. Do not stop taking it even if you start to feel better. General instructions  Make sure you: ? Pee until your bladder is empty. ? Do not hold pee for a long time. ? Empty your bladder after sex. ? Wipe from front to back after pooping if you are a female. Use each tissue one time when you wipe.  Drink enough fluid to keep your pee pale yellow.  Keep all follow-up visits as told by your doctor. This is important. Contact a doctor if:  You do not get better after 1-2 days.  Your symptoms go away and then come back. Get help right away if:  You have very bad back pain.  You have very bad pain in your lower belly.  You have a fever.  You are sick to your stomach (nauseous).  You are throwing up. Summary  A urinary tract infection (UTI) is an infection of any part of the urinary tract.  This condition is caused by germs in your genital area.  There are many risk factors for a UTI. These include having a small, thin   tube to drain pee and not being able to control when you pee or poop.  Treatment includes antibiotic medicines for germs.  Drink enough fluid to keep your pee pale yellow. This information is not intended to replace advice given to you by your health care provider. Make sure you discuss any questions you have with your health care provider. Document Released: 07/28/2007 Document Revised: 08/18/2017 Document Reviewed: 08/18/2017 Elsevier Interactive Patient Education  2019 Elsevier Inc.  

## 2018-03-27 NOTE — Progress Notes (Signed)
Subjective:    Patient ID: Erika Strong, female    DOB: 1946/01/11, 73 y.o.   MRN: [id number]  HPI  Pt is a 73 yo female with hx of UTI who presents to the clinic with urinary frequency and some lower abdominal discomfort for the last 2 days. Last UTI was October. Culture showed e.coli. she reports just "feeling off". She vomited once this morning. She has had a low grade fever at home and feels like she has some chills. Concerned about the flu going around.   .. Active Ambulatory Problems    Diagnosis Date Noted  . HLD (hyperlipidemia) 04/14/2007  . PROPTOSIS 01/26/2010  . HYPERTENSION, BENIGN 07/24/2007  . DIVERTICULITIS, COLON 11/25/2008  . POSTMENOPAUSAL STATUS 04/13/2007  . Lumbar spondylosis 12/12/2007  . OSTEOPENIA 06/04/2009  . Venous stasis 07/27/2010  . Endometrial cancer ([place]) 11/02/2010  . Malignant neoplasm of corpus uteri, except isthmus ([place]) 01/21/2011  . Metabolic syndrome X 19/37/9024  . Overweight(278.02) 09/16/2011  . Degenerative disc disease, cervical 12/07/2011  . Trigger finger, acquired 08/11/2012  . Metastatic cancer to pelvis ([place]) 06/23/2013  . Anemia due to chemotherapy 01/23/2014  . Chemotherapy-induced peripheral neuropathy ([place]) 01/23/2014  . Hepatic steatosis 02/12/2014  . Primary osteoarthritis of right hip 07/23/2016  . Endometrial adenocarcinoma ([place]) 10/29/2016  . Bilateral pulmonary embolism ([place]) 10/29/2016  . History of deep venous thrombosis (DVT) of distal vein of left lower extremity 10/29/2016  . Depression 10/22/2016   Resolved Ambulatory Problems    Diagnosis Date Noted  . URI 12/08/2009  . Need for prophylactic vaccination and inoculation against influenza 12/07/2012  . SIRS (systemic inflammatory response syndrome) ([place]) 01/24/2014  . Hypokalemia 01/24/2014  . Mild dehydration 01/24/2014  . Fever   . Hypersensitivity reaction 03/11/2014   Past Medical History:  Diagnosis Date  . Cancer ([place])   . Diverticulitis 2010  .  Hyperlipidemia   . Hypertension   . Wears glasses       Review of Systems   see HPI.  Objective:   Physical Exam Vitals signs reviewed.  Constitutional:      Appearance: Normal appearance.  HENT:     Head: Normocephalic and atraumatic.     Right Ear: Tympanic membrane and ear canal normal.     Left Ear: Tympanic membrane and ear canal normal.     Nose: Nose normal. No congestion.     Mouth/Throat:     Pharynx: Oropharynx is clear. No posterior oropharyngeal erythema.  Eyes:     Conjunctiva/sclera: Conjunctivae normal.  Cardiovascular:     Rate and Rhythm: Normal rate and regular rhythm.     Pulses: Normal pulses.  Pulmonary:     Effort: Pulmonary effort is normal.     Breath sounds: Normal breath sounds.  Abdominal:     Palpations: Abdomen is soft.     Tenderness: There is abdominal tenderness.  Skin:    Coloration: Skin is pale.  Neurological:     General: No focal deficit present.     Mental Status: She is alert and oriented to person, place, and time.  Psychiatric:        Mood and Affect: Mood normal.        Behavior: Behavior normal.           Assessment & Plan:  Marland KitchenMarland KitchenMysha was seen today for urinary tract infection.  Diagnoses and all orders for this visit:  Urinary frequency -     POCT Urinalysis Dipstick -  Urine Culture -     nitrofurantoin, macrocrystal-monohydrate, (MACROBID) 100 MG capsule; Take 1 capsule (100 mg total) by mouth 2 (two) times daily.  Abdominal pain, unspecified abdominal location -     POCT Urinalysis Dipstick -     Urine Culture -     nitrofurantoin, macrocrystal-monohydrate, (MACROBID) 100 MG capsule; Take 1 capsule (100 mg total) by mouth 2 (two) times daily.  Fever and chills -     POCT Influenza A/B  Non-intractable vomiting with nausea, unspecified vomiting type -     POCT Influenza A/B  Acute cystitis without hematuria -     nitrofurantoin, macrocrystal-monohydrate, (MACROBID) 100 MG capsule; Take 1 capsule (100  mg total) by mouth 2 (two) times daily.   .. Results for orders placed or performed in visit on 03/27/18  POCT Urinalysis Dipstick  Result Value Ref Range   Color, UA yellow    Clarity, UA clear    Glucose, UA Negative Negative   Bilirubin, UA negative    Ketones, UA negative    Spec Grav, UA 1.020 1.010 - 1.025   Blood, UA negative    pH, UA 7.5 5.0 - 8.0   Protein, UA Negative Negative   Urobilinogen, UA 0.2 0.2 or 1.0 E.U./dL   Nitrite, UA negative    Leukocytes, UA Small (1+) (A) Negative   Appearance     Odor    POCT Influenza A/B  Result Value Ref Range   Influenza A, POC Negative Negative   Influenza B, POC Negative Negative   Will culture urine. Treated emprically with macrodantin. Continue to stay hydrated.  Follow up as needed.   Negative for flu. Reassured patient.

## 2018-03-29 LAB — URINE CULTURE
MICRO NUMBER: 142920
RESULT: NO GROWTH
SPECIMEN QUALITY: ADEQUATE

## 2018-03-29 NOTE — Progress Notes (Signed)
Sure go ahead and complete medication.

## 2018-03-29 NOTE — Progress Notes (Signed)
Call pt: urine culture is negative for bacterial growth. You can stop antibiotic. How are you feeling today?

## 2018-04-05 NOTE — Progress Notes (Signed)
Subjective:   Erika Strong is a 73 y.o. female who presents for Medicare Annual (Subsequent) preventive examination.  Review of Systems:  No ROS.  Medicare Wellness Visit. Additional risk factors are reflected in the social history.  Cardiac Risk Factors include: advanced age (>76men, >61 women);hypertension Sleep patterns: Getting 8 hours of sleep at night.Wakes up during night 2-3 times to go to the bathroom. Wakes up feeling rested. Home Safety/Smoke Alarms: Feels safe in home. Smoke alarms in place.  Living environment; Lives in the home with husband in a 1 story home no stairs in the home. SHower is a walk in shower and a seat is in place. Seat Belt Safety/Bike Helmet: Wears seat belt.   Female:   Pap-  Aged out    Mammo-  scheduled      Dexa scan-  Scheduled while in office.      CCS- utd     Objective:     Vitals: BP (!) 131/55 (BP Location: Left Arm, Patient Position: Sitting, Cuff Size: Normal)   Pulse 69   Ht 5\' 6"  (1.676 m)   Wt 215 lb (97.5 kg)   SpO2 98%   BMI 34.70 kg/m   Body mass index is 34.7 kg/m.  Advanced Directives 04/12/2018 08/09/2014 07/15/2014 06/10/2014 06/06/2014 06/06/2014 05/20/2014  Does Patient Have a Medical Advance Directive? No No No No No No No  Type of Advance Directive - - - - - - -  Copy of Healthcare Power of Attorney in Chart? - - - - - - -  Would patient like information on creating a medical advance directive? No - Patient declined No - patient declined information No - patient declined information No - patient declined information - Yes - Scientist, clinical (histocompatibility and immunogenetics) given Yes - Scientist, clinical (histocompatibility and immunogenetics) given    Tobacco Social History   Tobacco Use  Smoking Status Never Smoker  Smokeless Tobacco Never Used     Counseling given: No   Clinical Intake:  Pre-visit preparation completed: Yes  Pain : No/denies pain     Nutritional Risks: None Diabetes: No  How often do you need to have someone help you when you read instructions,  pamphlets, or other written materials from your doctor or pharmacy?: 1 - Never What is the last grade level you completed in school?: 14  Interpreter Needed?: No  Information entered by :: Orlie Dakin, LPN  Past Medical History:  Diagnosis Date  . Anemia due to chemotherapy 01/23/2014  . Cancer ([place])   . Depression 10/22/2016  . Diverticulitis 2010  . Endometrial cancer ([place]) 11/02/2010  . Hepatic steatosis 02/12/2014  . Hyperlipidemia    2007  . Hypertension   . Malignant neoplasm of corpus uteri, except isthmus ([place]) 01/21/2011  . Wears glasses    Past Surgical History:  Procedure Laterality Date  . ABDOMINAL HYSTERECTOMY  11/24/2010   RTLH, BSO, RPLND, LPLNS  . GONADECTOMY AND HYSTERECTOMY  11/24/2010   Endometrial cancer, performed by Dr. Janie Morning  . MOLE REMOVAL  Nov 2012   Family History  Problem Relation Age of Onset  . Osteoporosis Mother   . Rheum arthritis Mother   . Cancer Mother   . Lung cancer Father        smoker  . Cancer Father    Social History   Socioeconomic History  . Marital status: Married    Spouse name: [name]  . Number of children: 1   . Years of education: Assoc degr  . Highest education  level: Associate degree: academic program  Occupational History  . Occupation: Engineer, maintenance    Comment: retired    Fish farm manager: [employer]  . Financial resource strain: Not hard at all  . Food insecurity:    Worry: Never true    Inability: Never true  . Transportation needs:    Medical: No    Non-medical: No  Tobacco Use  . Smoking status: Never Smoker  . Smokeless tobacco: Never Used  Substance and Sexual Activity  . Alcohol use: No  . Drug use: No  . Sexual activity: Not Currently    Comment: cosmetologist, teaches, associate degree, married, regularly exercises.   Lifestyle  . Physical activity:    Days per week: 7 days    Minutes per session: 20 min  . Stress: Not at all  Relationships  . Social  connections:    Talks on phone: More than three times a week    Gets together: Once a week    Attends religious service: More than 4 times per year    Active member of club or organization: No    Attends meetings of clubs or organizations: Never    Relationship status: Married  Other Topics Concern  . Not on file  Social History Narrative   Walks daily for exercise. Still works part time doing hair twice a week. No caffeine use    Outpatient Encounter Medications as of 04/12/2018  Medication Sig  . Ascorbic Acid (VITAMIN C CR) 1000 MG TBCR Take 1,000 mg by mouth daily.  . B Complex-C (B-COMPLEX WITH VITAMIN C) tablet Take 1 tablet by mouth daily.  . chlorthalidone (HYGROTON) 50 MG tablet TAKE 1 TABLET(50 MG) BY MOUTH DAILY  . CRANBERRY EXTRACT PO Take by mouth.  . dabigatran (PRADAXA) 150 MG CAPS capsule Take 1 capsule (150 mg total) by mouth 2 (two) times daily.  Marland Kitchen letrozole (FEMARA) 2.5 MG tablet Take 2.5 mg by mouth.  Marland Kitchen OVER THE COUNTER MEDICATION Take 1 tablet by mouth daily. Curamin  . OVER THE COUNTER MEDICATION Take 3 tablets by mouth daily. [place] Bone Strength (calcium product)  . OVER THE COUNTER MEDICATION Take 1 packet by mouth 2 (two) times daily. [place] Fleeting Vitamin Pak (contains fish oil, potassium and multi vitamins)  . OVER THE COUNTER MEDICATION Take 3 tablets by mouth 3 (three) times daily. Intestinal Sooth and Build  . OVER THE COUNTER MEDICATION Take 2 tablets by mouth 2 (two) times daily. Stress J  . OVER THE COUNTER MEDICATION Take 1 tablet by mouth daily. Liver Care  . OVER THE COUNTER MEDICATION Take 10 mLs by mouth daily with breakfast. Liquid chlorophyl  . OVER THE COUNTER MEDICATION Take 30 mLs by mouth daily with breakfast. Flax Seed Oil  . OVER THE COUNTER MEDICATION Take 2 tablets by mouth 2 (two) times daily. Stress Gaurd  . potassium chloride (MICRO-K) 10 MEQ CR capsule TAKE 1 CAPSULE(10 MEQ) BY MOUTH DAILY  . Probiotic Product (PROBIOTIC DAILY)  CAPS Take 1 capsule by mouth daily.  Marland Kitchen Ubiquinol 100 MG CAPS Take 100 mg by mouth daily with breakfast.   . UNABLE TO FIND Take by mouth.  Marland Kitchen acyclovir ointment (ZOVIRAX) 5 % Apply to fever blister 5 times a day (Patient not taking: Reported on 04/12/2018)  . furosemide (LASIX) 40 MG tablet TAKE 1 TABLET(40 MG) BY MOUTH DAILY AS NEEDED FOR SWELLING (Patient not taking: Reported on 04/12/2018)  . traZODone (DESYREL) 50 MG tablet Take 0.5-1 tablets (  25-50 mg total) by mouth at bedtime as needed for sleep. (Patient not taking: Reported on 04/12/2018)  . triamcinolone cream (KENALOG) 0.5 % Apply 1 application topically 2 (two) times daily as needed. (Patient not taking: Reported on 04/12/2018)  . [DISCONTINUED] nitrofurantoin, macrocrystal-monohydrate, (MACROBID) 100 MG capsule Take 1 capsule (100 mg total) by mouth 2 (two) times daily.   No facility-administered encounter medications on file as of 04/12/2018.     Activities of Daily Living In your present state of health, do you have any difficulty performing the following activities: 04/12/2018  Hearing? N  Vision? N  Difficulty concentrating or making decisions? N  Walking or climbing stairs? N  Dressing or bathing? N  Doing errands, shopping? N  Preparing Food and eating ? N  Using the Toilet? N  In the past six months, have you accidently leaked urine? N  Do you have problems with loss of bowel control? N  Managing your Medications? N  Managing your Finances? N  Housekeeping or managing your Housekeeping? N  Some recent data might be hidden    Patient Care Team: Hali Marry, MD as PCP - De Burrs, MD as Attending Physician (Obstetrics and Gynecology) Mellody Drown, MD as Referring Physician (Obstetrics and Gynecology)    Assessment:   This is a routine wellness examination for [place].Physical assessment deferred to PCP.   Exercise Activities and Dietary recommendations Current Exercise Habits: Home exercise  routine, Type of exercise: Other - see comments(stationary bike), Time (Minutes): 20, Frequency (Times/Week): 7, Weekly Exercise (Minutes/Week): 140, Exercise limited by: None identified Diet eats a healthy diet with fruits and vegetables daily  Breakfast:oatmeal, scrambled egg, toast juice Lunch: soup and sandwich Dinner:meat and vegetables       Goals    . Patient Stated     Patient stated that she wanted to continue to improve her health in 2020.  Would like for the cancer to be gone.       Fall Risk Fall Risk  04/12/2018 02/27/2018 10/29/2016 06/21/2016 06/17/2015  Falls in the past year? 1 0 No No Yes  Number falls in past yr: 0 0 - - 1  Injury with Fall? 0 - - - No  Follow up Falls prevention discussed - - - Falls prevention discussed   Is the patient's home free of loose throw rugs in walkways, pet beds, electrical cords, etc?   yes      Grab bars in the bathroom? no      Handrails on the stairs?   yes      Adequate lighting?   yes   Depression Screen PHQ 2/9 Scores 04/12/2018 02/27/2018 04/20/2017 10/29/2016  PHQ - 2 Score 0 0 0 0  PHQ- 9 Score - - 2 -     Cognitive Function     6CIT Screen 04/12/2018  What Year? 0 points  What month? 0 points  What time? 0 points  Count back from 20 0 points  Months in reverse 0 points  Repeat phrase 2 points  Total Score 2    Immunization History  Administered Date(s) Administered  . Influenza Split 01/01/2011, 01/11/2012  . Influenza Whole 11/30/2007, 11/22/2008  . Influenza,inj,Quad PF,6+ Mos 12/07/2012, 10/31/2013, 11/19/2014, 12/22/2015, 12/21/2016, 11/15/2017  . Pneumococcal Conjugate-13 08/09/2014  . Pneumococcal Polysaccharide-23 09/22/2012  . Td 04/13/2007  . Tdap 05/02/2017  . Zoster 03/19/2009    Screening Tests Health Maintenance  Topic Date Due  . COLONOSCOPY  07/03/2019  . MAMMOGRAM  08/25/2019  .  TETANUS/TDAP  05/03/2027  . INFLUENZA VACCINE  Completed  . DEXA SCAN  Completed  . Hepatitis C Screening   Completed  . PNA vac Low Risk Adult  Completed       Plan:    Please schedule your next medicare wellness visit with me in 1 yr.  Ms. Ruffino , Thank you for taking time to come for your Medicare Wellness Visit. I appreciate your ongoing commitment to your health goals. Please review the following plan we discussed and let me know if I can assist you in the future.  Continue doing brain stimulating activities (puzzles, reading, adult coloring books, staying active) to keep memory sharp.    These are the goals we discussed: Goals    . Patient Stated     Patient stated that she wanted to continue to improve her health in 2020.  Would like for the cancer to be gone.       This is a list of the screening recommended for you and due dates:  Health Maintenance  Topic Date Due  . Colon Cancer Screening  07/03/2019  . Mammogram  08/25/2019  . Tetanus Vaccine  05/03/2027  . Flu Shot  Completed  . DEXA scan (bone density measurement)  Completed  .  Hepatitis C: One time screening is recommended by Center for Disease Control  (CDC) for  adults born from [birth year] through 1963.   Completed  . Pneumonia vaccines  Completed      I have personally reviewed and noted the following in the patient's chart:   . Medical and social history . Use of alcohol, tobacco or illicit drugs  . Current medications and supplements . Functional ability and status . Nutritional status . Physical activity . Advanced directives . List of other physicians . Hospitalizations, surgeries, and ER visits in previous 12 months . Vitals . Screenings to include cognitive, depression, and falls . Referrals and appointments  In addition, I have reviewed and discussed with patient certain preventive protocols, quality metrics, and best practice recommendations. A written personalized care plan for preventive services as well as general preventive health recommendations were provided to patient.     Joanne Chars,  LPN  [id number]

## 2018-04-12 ENCOUNTER — Ambulatory Visit (INDEPENDENT_AMBULATORY_CARE_PROVIDER_SITE_OTHER): Payer: Medicare Other | Admitting: *Deleted

## 2018-04-12 VITALS — BP 131/55 | HR 69 | Ht 66.0 in | Wt 215.0 lb

## 2018-04-12 DIAGNOSIS — Z13820 Encounter for screening for osteoporosis: Secondary | ICD-10-CM

## 2018-04-12 DIAGNOSIS — Z0000 Encounter for general adult medical examination without abnormal findings: Secondary | ICD-10-CM

## 2018-04-12 NOTE — Patient Instructions (Addendum)
Please schedule your next medicare wellness visit with me in 1 yr.  Ms. Wardle , Thank you for taking time to come for your Medicare Wellness Visit. I appreciate your ongoing commitment to your health goals. Please review the following plan we discussed and let me know if I can assist you in the future.  Continue doing brain stimulating activities (puzzles, reading, adult coloring books, staying active) to keep memory sharp.  These are the goals we discussed: Goals    . Patient Stated     Patient stated that she wanted to continue to improve her health in 2020.  Would like for the cancer to be gone.

## 2018-04-13 ENCOUNTER — Ambulatory Visit: Payer: Medicare Other

## 2018-04-13 ENCOUNTER — Other Ambulatory Visit: Payer: Self-pay | Admitting: Physician Assistant

## 2018-04-13 DIAGNOSIS — R350 Frequency of micturition: Secondary | ICD-10-CM

## 2018-04-13 NOTE — Progress Notes (Signed)
Pt instructed by Luvenia Starch to have a repeat U/A done after finishing antibiotics. Ordered and pt advised to go to lab downstairs.

## 2018-04-14 ENCOUNTER — Encounter: Payer: Self-pay | Admitting: Family Medicine

## 2018-04-14 NOTE — Progress Notes (Signed)
Waiting for culture to confirm clearance of bacteria.

## 2018-04-15 LAB — URINALYSIS W MICROSCOPIC + REFLEX CULTURE
Bacteria, UA: NONE SEEN /HPF
Bilirubin Urine: NEGATIVE
GLUCOSE, UA: NEGATIVE
Hgb urine dipstick: NEGATIVE
Hyaline Cast: NONE SEEN /LPF
Ketones, ur: NEGATIVE
Nitrites, Initial: NEGATIVE
PH: 7 (ref 5.0–8.0)
Protein, ur: NEGATIVE
RBC / HPF: NONE SEEN /HPF (ref 0–2)
SPECIFIC GRAVITY, URINE: 1.007 (ref 1.001–1.03)
Squamous Epithelial / LPF: NONE SEEN /HPF (ref <=5)
WBC, UA: NONE SEEN /HPF (ref 0–5)

## 2018-04-15 LAB — URINE CULTURE
MICRO NUMBER:: 226150
Result:: NO GROWTH
SPECIMEN QUALITY:: ADEQUATE

## 2018-04-15 LAB — CULTURE INDICATED

## 2018-04-17 ENCOUNTER — Other Ambulatory Visit: Payer: Self-pay | Admitting: Family Medicine

## 2018-04-17 NOTE — Progress Notes (Signed)
No bacteria growth on check for clearance! Great news.

## 2018-04-20 ENCOUNTER — Other Ambulatory Visit: Payer: Self-pay | Admitting: Family Medicine

## 2018-04-26 ENCOUNTER — Ambulatory Visit (INDEPENDENT_AMBULATORY_CARE_PROVIDER_SITE_OTHER): Payer: Medicare Other

## 2018-04-26 DIAGNOSIS — M8589 Other specified disorders of bone density and structure, multiple sites: Secondary | ICD-10-CM

## 2018-07-16 ENCOUNTER — Other Ambulatory Visit: Payer: Self-pay | Admitting: Family Medicine

## 2018-08-21 ENCOUNTER — Telehealth: Payer: Self-pay | Admitting: Neurology

## 2018-08-21 ENCOUNTER — Other Ambulatory Visit: Payer: Self-pay

## 2018-08-21 ENCOUNTER — Other Ambulatory Visit: Payer: Self-pay | Admitting: Family Medicine

## 2018-08-21 ENCOUNTER — Ambulatory Visit
Admission: RE | Admit: 2018-08-21 | Discharge: 2018-08-21 | Disposition: A | Discharge status: Home / Self Care | Payer: Medicare Other | Source: Ambulatory Visit | Attending: Family Medicine | Admitting: Family Medicine

## 2018-08-21 DIAGNOSIS — R590 Localized enlarged lymph nodes: Secondary | ICD-10-CM

## 2018-08-21 DIAGNOSIS — R921 Mammographic calcification found on diagnostic imaging of breast: Secondary | ICD-10-CM

## 2018-08-21 NOTE — Telephone Encounter (Signed)
Your patient 

## 2018-08-21 NOTE — Telephone Encounter (Signed)
Patient left vm asking if she could stop Pradaxa tomorrow and go back on Thursday. She is scheduled for a breast biopsy on Wednesday at 7:30 am. Please advise.

## 2018-08-21 NOTE — Telephone Encounter (Signed)
Ok to skip pradaxa for for procedure. Start back ASAP after procedure.

## 2018-08-21 NOTE — Telephone Encounter (Signed)
Pt advised. No further questions or concerns at this time.

## 2018-08-23 ENCOUNTER — Ambulatory Visit
Admission: RE | Admit: 2018-08-23 | Discharge: 2018-08-23 | Disposition: A | Discharge status: Home / Self Care | Payer: Medicare Other | Source: Ambulatory Visit | Attending: Family Medicine | Admitting: Family Medicine

## 2018-08-23 ENCOUNTER — Other Ambulatory Visit (HOSPITAL_COMMUNITY): Payer: Self-pay | Admitting: Diagnostic Radiology

## 2018-08-23 ENCOUNTER — Other Ambulatory Visit (HOSPITAL_COMMUNITY)
Admission: RE | Admit: 2018-08-23 | Discharge: 2018-08-23 | Disposition: A | Discharge status: Home / Self Care | Payer: Medicare Other | Source: Ambulatory Visit | Attending: Diagnostic Radiology | Admitting: Diagnostic Radiology

## 2018-08-23 ENCOUNTER — Other Ambulatory Visit: Payer: Self-pay

## 2018-08-23 DIAGNOSIS — R590 Localized enlarged lymph nodes: Secondary | ICD-10-CM

## 2018-08-28 ENCOUNTER — Ambulatory Visit (INDEPENDENT_AMBULATORY_CARE_PROVIDER_SITE_OTHER): Payer: Medicare Other | Admitting: Family Medicine

## 2018-08-28 ENCOUNTER — Encounter: Payer: Self-pay | Admitting: Family Medicine

## 2018-08-28 ENCOUNTER — Other Ambulatory Visit: Payer: Self-pay

## 2018-08-28 VITALS — BP 130/82 | HR 61 | Ht 66.0 in | Wt 203.0 lb

## 2018-08-28 DIAGNOSIS — I10 Essential (primary) hypertension: Secondary | ICD-10-CM

## 2018-08-28 DIAGNOSIS — T451X5A Adverse effect of antineoplastic and immunosuppressive drugs, initial encounter: Secondary | ICD-10-CM

## 2018-08-28 DIAGNOSIS — C541 Malignant neoplasm of endometrium: Secondary | ICD-10-CM

## 2018-08-28 DIAGNOSIS — K760 Fatty (change of) liver, not elsewhere classified: Secondary | ICD-10-CM

## 2018-08-28 DIAGNOSIS — G620 Drug-induced polyneuropathy: Secondary | ICD-10-CM | POA: Diagnosis not present

## 2018-08-28 DIAGNOSIS — Z86711 Personal history of pulmonary embolism: Secondary | ICD-10-CM

## 2018-08-28 NOTE — Assessment & Plan Note (Signed)
No recent changes. No on medication, etc.

## 2018-08-28 NOTE — Assessment & Plan Note (Signed)
Due to recheck liver enzymes. 

## 2018-08-28 NOTE — Progress Notes (Signed)
Established Patient Office Visit  Subjective:  Patient ID: Erika Strong, female    DOB: 1945-03-15  Age: 73 y.o. MRN: [id number]  CC:  Chief Complaint  Patient presents with  . Hypertension    HPI YENI JIGGETTS presents for   Hypertension- Pt denies chest pain, SOB, dizziness, or heart palpitations.  Taking meds as directed w/o problems.  Denies medication side effects. She is only taking 1/2 tab of chlorthalidone.      jUst had a breast biospy last week at [facility]. They biopsed her lymph nodes.  She  F/U endometrial cancer - she is doing well.  Still following at [facility] with Addison Lank.  She has back muscle on working 2 days a week but she is been very careful she is been wearing her mask etc. 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  She is still on letrozole  Hx of bilateral PE - doing well on Pradaxa.  She is done well on this particular anticoagulant.  She is had difficulties with others in the past.  She will be on this lifelong.  Past Medical History:  Diagnosis Date  . Anemia due to chemotherapy 01/23/2014  . Cancer ([place])   . Depression 10/22/2016  . Diverticulitis 2010  . Endometrial cancer ([place]) 11/02/2010  . Hepatic steatosis 02/12/2014  . Hyperlipidemia    2007  . Hypertension   . Malignant neoplasm of corpus uteri, except isthmus ([place]) 01/21/2011  . Wears glasses     Past Surgical History:  Procedure Laterality Date  . ABDOMINAL HYSTERECTOMY  11/24/2010   RTLH, BSO, RPLND, LPLNS  . GONADECTOMY AND HYSTERECTOMY  11/24/2010   Endometrial cancer, performed by Dr. Janie Morning  . MOLE REMOVAL  Nov 2012    Family History  Problem Relation Age of Onset  . Osteoporosis Mother   . Rheum arthritis Mother   . Cancer Mother   . Lung cancer Father        smoker  . Cancer Father     Social History   Socioeconomic History  . Marital status: Married    Spouse name: [name]  . Number of children: 1   . Years of education: Assoc degr  .  Highest education level: Associate degree: academic program  Occupational History  . Occupation: Engineer, maintenance    Comment: retired    Fish farm manager: [employer] [place]  . Financial resource strain: Not hard at all  . Food insecurity    Worry: Never true    Inability: Never true  . Transportation needs    Medical: No    Non-medical: No  Tobacco Use  . Smoking status: Never Smoker  . Smokeless tobacco: Never Used  Substance and Sexual Activity  . Alcohol use: No  . Drug use: No  . Sexual activity: Not Currently    Comment: cosmetologist, teaches, associate degree, married, regularly exercises.   Lifestyle  . Physical activity    Days per week: 7 days    Minutes per session: 20 min  . Stress: Not at all  Relationships  . Social connections    Talks on phone: More than three times a week    Gets together: Once a week    Attends religious service: More than 4 times per year    Active member of club or organization: No    Attends meetings of clubs or organizations: Never    Relationship status: Married  . Intimate partner violence  Fear of current or ex partner: No    Emotionally abused: No    Physically abused: No    Forced sexual activity: No  Other Topics Concern  . Not on file  Social History Narrative   Walks daily for exercise. Still works part time doing hair twice a week. No caffeine use    Outpatient Medications Prior to Visit  Medication Sig Dispense Refill  . Ascorbic Acid (VITAMIN C CR) 1000 MG TBCR Take 1,000 mg by mouth daily.    . B Complex-C (B-COMPLEX WITH VITAMIN C) tablet Take 1 tablet by mouth daily.    . chlorthalidone (HYGROTON) 50 MG tablet TAKE 1 TABLET(50 MG) BY MOUTH DAILY 90 tablet 1  . CRANBERRY EXTRACT PO Take by mouth.    . letrozole (FEMARA) 2.5 MG tablet Take 2.5 mg by mouth.    Marland Kitchen OVER THE COUNTER MEDICATION Take 1 tablet by mouth daily. Curamin    . OVER THE COUNTER MEDICATION Take 3 tablets by mouth daily. New  Chapter Bone Strength (calcium product)    . OVER THE COUNTER MEDICATION Take 1 packet by mouth 2 (two) times daily. Preston Fleeting Vitamin Pak (contains fish oil, potassium and multi vitamins)    . OVER THE COUNTER MEDICATION Take 3 tablets by mouth 3 (three) times daily. Intestinal Sooth and Build    . OVER THE COUNTER MEDICATION Take 2 tablets by mouth 2 (two) times daily. Stress J    . OVER THE COUNTER MEDICATION Take 1 tablet by mouth daily. Liver Care    . OVER THE COUNTER MEDICATION Take 10 mLs by mouth daily with breakfast. Liquid chlorophyl    . OVER THE COUNTER MEDICATION Take 30 mLs by mouth daily with breakfast. Flax Seed Oil    . OVER THE COUNTER MEDICATION Take 2 tablets by mouth 2 (two) times daily. Stress Gaurd    . potassium chloride (MICRO-K) 10 MEQ CR capsule TAKE 1 CAPSULE(10 MEQ) BY MOUTH DAILY 90 capsule 1  . PRADAXA 150 MG CAPS capsule TAKE 1 CAPSULE(150 MG) BY MOUTH TWICE DAILY 60 capsule 11  . Probiotic Product (PROBIOTIC DAILY) CAPS Take 1 capsule by mouth daily.    Marland Kitchen Ubiquinol 100 MG CAPS Take 100 mg by mouth daily with breakfast.     . UNABLE TO FIND Take by mouth.    Marland Kitchen acyclovir ointment (ZOVIRAX) 5 % Apply to fever blister 5 times a day (Patient not taking: Reported on 04/12/2018) 30 g 1  . furosemide (LASIX) 40 MG tablet TAKE 1 TABLET(40 MG) BY MOUTH DAILY AS NEEDED FOR SWELLING (Patient not taking: Reported on 04/12/2018) 90 tablet 0  . traZODone (DESYREL) 50 MG tablet Take 0.5-1 tablets (25-50 mg total) by mouth at bedtime as needed for sleep. (Patient not taking: Reported on 04/12/2018) 30 tablet 3  . triamcinolone cream (KENALOG) 0.5 % Apply 1 application topically 2 (two) times daily as needed. (Patient not taking: Reported on 04/12/2018) 30 g prn   No facility-administered medications prior to visit.     Allergies  Allergen Reactions  . Granix [Filgrastim]     Shot on [employer]. Developed chills and fever  . Atorvastatin Other (See Comments)     myalgias  .  Fish Allergy Nausea And Vomiting  . Lisinopril Other (See Comments)     Cough  . Pravastatin Sodium Other (See Comments)     myalgias  . Simvastatin Other (See Comments)     myalgias  . Xarelto [Rivaroxaban] Other (See Comments)  Tachycardia  . Eliquis [Apixaban] Rash  . Keflex [Cephalexin] Rash    ROS Review of Systems    Objective:    Physical Exam  Constitutional: She is oriented to person, place, and time. She appears well-developed and well-nourished.  HENT:  Head: Normocephalic and atraumatic.  Cardiovascular: Normal rate, regular rhythm and normal heart sounds.  Pulmonary/Chest: Effort normal and breath sounds normal.  Neurological: She is alert and oriented to person, place, and time.  Skin: Skin is warm and dry.  Psychiatric: She has a normal mood and affect. Her behavior is normal.    BP 130/82   Pulse 61   Ht 5' 6"  (1.676 m)   Wt 203 lb (92.1 kg)   SpO2 99%   BMI 32.77 kg/m  Wt Readings from Last 3 Encounters:  08/28/18 203 lb (92.1 kg)  04/12/18 215 lb (97.5 kg)  03/27/18 215 lb (97.5 kg)     There are no preventive care reminders to display for this patient.  There are no preventive care reminders to display for this patient.  Lab Results  Component Value Date   TSH 2.70 02/27/2018   Lab Results  Component Value Date   WBC 8.1 02/27/2018   HGB 14.3 02/27/2018   HCT 42.7 02/27/2018   MCV 89.3 02/27/2018   PLT 272 02/27/2018   Lab Results  Component Value Date   NA 141 02/27/2018   K 3.5 02/27/2018   CHLORIDE 106 07/15/2014   CO2 32 02/27/2018   GLUCOSE 86 02/27/2018   BUN 11 02/27/2018   CREATININE 0.74 02/27/2018   BILITOT 0.6 02/27/2018   ALKPHOS 44 06/21/2016   AST 25 02/27/2018   ALT 20 02/27/2018   PROT 6.5 02/27/2018   ALBUMIN 3.8 06/21/2016   CALCIUM 9.8 02/27/2018   ANIONGAP 11 07/15/2014   EGFR 81 (L) 07/15/2014   Lab Results  Component Value Date   CHOL 238 (H) 02/27/2018   Lab Results  Component Value Date    HDL 56 02/27/2018   Lab Results  Component Value Date   LDLCALC 153 (H) 02/27/2018   Lab Results  Component Value Date   TRIG 159 (H) 02/27/2018   Lab Results  Component Value Date   CHOLHDL 4.3 02/27/2018   Lab Results  Component Value Date   HGBA1C 5.2 06/21/2016      Assessment & Plan:   Problem List Items Addressed This Visit      Cardiovascular and Mediastinum   HYPERTENSION, BENIGN - Primary    Well-controlled and she is only on a half a tab daily of the chlorthalidone.  Also feels like her lower extremity swelling is well-controlled on this current regimen.      Relevant Orders   COMPLETE METABOLIC PANEL WITH GFR     Digestive   Hepatic steatosis    Due to recheck liver enzymes.      Relevant Orders   COMPLETE METABOLIC PANEL WITH GFR     Nervous and Auditory   Chemotherapy-induced peripheral neuropathy (HCC)    No recent changes. No on medication, etc.         Genitourinary   Endometrial cancer ([place])    Followed at [facility]. On Letrozole.         Other   History of pulmonary embolism    Continue pradaxa.         No orders of the defined types were placed in this encounter.   Follow-up: Return in about 6 months (around 02/28/2019) for  Hypertension.    Beatrice Lecher, MD

## 2018-08-28 NOTE — Assessment & Plan Note (Signed)
Continue pradaxa. 

## 2018-08-28 NOTE — Assessment & Plan Note (Signed)
Followed at [facility]. On Letrozole.

## 2018-08-28 NOTE — Assessment & Plan Note (Signed)
Well-controlled and she is only on a half a tab daily of the chlorthalidone.  Also feels like her lower extremity swelling is well-controlled on this current regimen.

## 2018-08-29 ENCOUNTER — Other Ambulatory Visit: Payer: Self-pay | Admitting: Family Medicine

## 2018-08-29 ENCOUNTER — Other Ambulatory Visit: Payer: Self-pay

## 2018-08-29 DIAGNOSIS — Z1231 Encounter for screening mammogram for malignant neoplasm of breast: Secondary | ICD-10-CM

## 2018-08-29 LAB — COMPLETE METABOLIC PANEL WITHOUT GFR
AG Ratio: 1.5 (calc) (ref 1.0–2.5)
ALT: 17 U/L (ref 6–29)
AST: 26 U/L (ref 10–35)
Albumin: 4.1 g/dL (ref 3.6–5.1)
Alkaline phosphatase (APISO): 70 U/L (ref 37–153)
BUN: 14 mg/dL (ref 7–25)
CO2: 29 mmol/L (ref 20–32)
Calcium: 9.9 mg/dL (ref 8.6–10.4)
Chloride: 100 mmol/L (ref 98–110)
Creat: 0.76 mg/dL (ref 0.60–0.93)
GFR, Est African American: 91 mL/min/1.73m2 (ref >=60)
GFR, Est Non African American: 79 mL/min/1.73m2 (ref >=60)
Globulin: 2.7 g/dL (ref 1.9–3.7)
Glucose, Bld: 84 mg/dL (ref 65–99)
Potassium: 3.7 mmol/L (ref 3.5–5.3)
Sodium: 139 mmol/L (ref 135–146)
Total Bilirubin: 0.6 mg/dL (ref 0.2–1.2)
Total Protein: 6.8 g/dL (ref 6.1–8.1)

## 2018-08-29 MED ORDER — CHLORTHALIDONE 50 MG PO TABS: 25.0000 mg | ORAL_TABLET | Freq: Every day | ORAL | 1 refills | Status: DC

## 2018-08-29 NOTE — Progress Notes (Signed)
All labs are normal. 

## 2018-08-30 ENCOUNTER — Other Ambulatory Visit: Payer: Medicare Other

## 2018-10-09 ENCOUNTER — Telehealth: Payer: Self-pay

## 2018-10-09 NOTE — Telephone Encounter (Signed)
Patient called stating that she was at the salon on Friday and choked on her saliva. Patient reports she coughed a whole lot and coughed up some blood in her mask. Patient has not coughed up any blood since Friday.   Patient's cancer doctor at [facility] recommended that she get an appointment here with PCP to be evaluated. Wanting someone to listen to pt's heart and lungs and possibly do a chest x-ray. Patient's recent BP was 128/78 pulse 72, temp this morning 97.3.  I have added patient to in office visit at 11:10 on 10/10/18.

## 2018-10-10 ENCOUNTER — Ambulatory Visit (INDEPENDENT_AMBULATORY_CARE_PROVIDER_SITE_OTHER): Payer: Medicare Other | Admitting: Family Medicine

## 2018-10-10 ENCOUNTER — Other Ambulatory Visit: Payer: Self-pay

## 2018-10-10 ENCOUNTER — Encounter: Payer: Self-pay | Admitting: Family Medicine

## 2018-10-10 VITALS — BP 128/53 | HR 71 | Temp 98.6°F | Ht 66.0 in | Wt 205.0 lb

## 2018-10-10 DIAGNOSIS — T17308A Unspecified foreign body in larynx causing other injury, initial encounter: Secondary | ICD-10-CM | POA: Diagnosis not present

## 2018-10-10 DIAGNOSIS — Z23 Encounter for immunization: Secondary | ICD-10-CM

## 2018-10-10 DIAGNOSIS — R05 Cough: Secondary | ICD-10-CM | POA: Diagnosis not present

## 2018-10-10 DIAGNOSIS — R059 Cough, unspecified: Secondary | ICD-10-CM

## 2018-10-10 NOTE — Progress Notes (Signed)
Established Patient Office Visit  Subjective:  Patient ID: Erika Strong, female    DOB: 01/13/1946  Age: 73 y.o. MRN: [id number]  CC:  Chief Complaint  Patient presents with  . choked    HPI Erika Strong presents for   Patient states she was at the salon on Friday and felt like she was having some drainage and started to choke on her saliva.  And started coughing.  Unfortunately she continued to cough for quite a while and noticed just some clear sputum.  But then later that evening started coughing again and noticed a little pink-tinged sputum.  It seemed to settle down and then after going to bed woke up and noticed that she coughed up just a little bit of bright red blood.  It was a very small amount.  Patient reports she coughed a whole lot and coughed up some blood in her mask.  She reports a couple times she is coughed and just had a little pink tinge.  But she does not feel like she is sick.  She has not had any nasal congestion sore throat.  No pain with swallowing.  She has not had any fevers chills or sweats.  She does not feel short of breath.  She says she was very busy last week and put up over ?200 of corn.  He is scheduled for a CAT scan on October 31 at [facility] of the chest abdomen and pelvis to follow-up on her cancer.  Patient's cancer doctor at [facility] recommended that she get an appointment here with PCP to be evaluated. Wanting someone to listen to pt's heart and lungs and possibly do a chest x-ray. Patient's recent BP was 128/78 pulse 72, temp this morning 97.3  Past Medical History:  Diagnosis Date  . Anemia due to chemotherapy 01/23/2014  . Cancer ([place])   . Depression 10/22/2016  . Diverticulitis 2010  . Endometrial cancer ([place]) 11/02/2010  . Hepatic steatosis 02/12/2014  . Hyperlipidemia    2007  . Hypertension   . Malignant neoplasm of corpus uteri, except isthmus ([place]) 01/21/2011  . Wears glasses     Past Surgical History:  Procedure Laterality Date  .  ABDOMINAL HYSTERECTOMY  11/24/2010   RTLH, BSO, RPLND, LPLNS  . GONADECTOMY AND HYSTERECTOMY  11/24/2010   Endometrial cancer, performed by Dr. Janie Morning  . MOLE REMOVAL  Nov 2012    Family History  Problem Relation Age of Onset  . Osteoporosis Mother   . Rheum arthritis Mother   . Cancer Mother   . Lung cancer Father        smoker  . Cancer Father     Social History   Socioeconomic History  . Marital status: Married    Spouse name: [name]  . Number of children: 1   . Years of education: Assoc degr  . Highest education level: Associate degree: academic program  Occupational History  . 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 Financial resource strain: Not hard at all  . Food insecurity    Worry: Never true    Inability: Never true  . Transportation needs    Medical: No    Non-medical: No  Tobacco Use  . Smoking status: Never Smoker  . Smokeless tobacco: Never Used  Substance and Sexual Activity  . Alcohol use: No  . Drug use: No  . Sexual activity: Not Currently  Comment: cosmetologist, teaches, associate degree, married, regularly exercises.   Lifestyle  . Physical activity    Days per week: 7 days    Minutes per session: 20 min  . Stress: Not at all  Relationships  . Social connections    Talks on phone: More than three times a week    Gets together: Once a week    Attends religious service: More than 4 times per year    Active member of club or organization: No    Attends meetings of clubs or organizations: Never    Relationship status: Married  . Intimate partner violence    Fear of current or ex partner: No    Emotionally abused: No    Physically abused: No    Forced sexual activity: No  Other Topics Concern  . Not on file  Social History Narrative   Walks daily for exercise. Still works part time doing hair twice a week. No caffeine use    Outpatient Medications Prior to  Visit  Medication Sig Dispense Refill  . Ascorbic Acid (VITAMIN C CR) 1000 MG TBCR Take 1,000 mg by mouth daily.    . B Complex-C (B-COMPLEX WITH VITAMIN C) tablet Take 1 tablet by mouth daily.    . chlorthalidone (HYGROTON) 50 MG tablet Take 0.5 tablets (25 mg total) by mouth daily. 90 tablet 1  . CRANBERRY EXTRACT PO Take by mouth.    . letrozole (FEMARA) 2.5 MG tablet Take 2.5 mg by mouth.    Marland Kitchen OVER THE COUNTER MEDICATION Take 1 tablet by mouth daily. Curamin    . OVER THE COUNTER MEDICATION Take 3 tablets by mouth daily. New Chapter Bone Strength (calcium product)    . OVER THE COUNTER MEDICATION Take 1 packet by mouth 2 (two) times daily. Preston Fleeting Vitamin Pak (contains fish oil, potassium and multi vitamins)    . OVER THE COUNTER MEDICATION Take 3 tablets by mouth 3 (three) times daily. Intestinal Sooth and Build    . OVER THE COUNTER MEDICATION Take 2 tablets by mouth 2 (two) times daily. Stress J    . OVER THE COUNTER MEDICATION Take 1 tablet by mouth daily. Liver Care    . OVER THE COUNTER MEDICATION Take 10 mLs by mouth daily with breakfast. Liquid chlorophyl    . OVER THE COUNTER MEDICATION Take 30 mLs by mouth daily with breakfast. Flax Seed Oil    . OVER THE COUNTER MEDICATION Take 2 tablets by mouth 2 (two) times daily. Stress Gaurd    . potassium chloride (MICRO-K) 10 MEQ CR capsule TAKE 1 CAPSULE(10 MEQ) BY MOUTH DAILY 90 capsule 1  . PRADAXA 150 MG CAPS capsule TAKE 1 CAPSULE(150 MG) BY MOUTH TWICE DAILY 60 capsule 11  . Probiotic Product (PROBIOTIC DAILY) CAPS Take 1 capsule by mouth daily.    Marland Kitchen Ubiquinol 100 MG CAPS Take 100 mg by mouth daily with breakfast.     . UNABLE TO FIND Take by mouth.     No facility-administered medications prior to visit.     Allergies  Allergen Reactions  . Granix [Filgrastim]     Shot on [employer]. Developed chills and fever  . Atorvastatin Other (See Comments)     myalgias  . Fish Allergy Nausea And Vomiting  . Lisinopril Other (See  Comments)     Cough  . Pravastatin Sodium Other (See Comments)     myalgias  . Simvastatin Other (See Comments)     myalgias  . Xarelto [Rivaroxaban]  Other (See Comments)    Tachycardia  . Eliquis [Apixaban] Rash  . Keflex [Cephalexin] Rash    ROS Review of Systems    Objective:    Physical Exam  BP (!) 128/53   Pulse 71   Temp 98.6 F (37 C)   Ht 5' 6"  (1.676 m)   Wt 205 lb (93 kg)   SpO2 99%   BMI 33.09 kg/m  Wt Readings from Last 3 Encounters:  10/10/18 205 lb (93 kg)  08/28/18 203 lb (92.1 kg)  04/12/18 215 lb (97.5 kg)     Health Maintenance Due  Topic Date Due  . INFLUENZA VACCINE  09/23/2018    There are no preventive care reminders to display for this patient.  Lab Results  Component Value Date   TSH 2.70 02/27/2018   Lab Results  Component Value Date   WBC 8.1 02/27/2018   HGB 14.3 02/27/2018   HCT 42.7 02/27/2018   MCV 89.3 02/27/2018   PLT 272 02/27/2018   Lab Results  Component Value Date   NA 139 08/28/2018   K 3.7 08/28/2018   CHLORIDE 106 07/15/2014   CO2 29 08/28/2018   GLUCOSE 84 08/28/2018   BUN 14 08/28/2018   CREATININE 0.76 08/28/2018   BILITOT 0.6 08/28/2018   ALKPHOS 44 06/21/2016   AST 26 08/28/2018   ALT 17 08/28/2018   PROT 6.8 08/28/2018   ALBUMIN 3.8 06/21/2016   CALCIUM 9.9 08/28/2018   ANIONGAP 11 07/15/2014   EGFR 81 (L) 07/15/2014   Lab Results  Component Value Date   CHOL 238 (H) 02/27/2018   Lab Results  Component Value Date   HDL 56 02/27/2018   Lab Results  Component Value Date   LDLCALC 153 (H) 02/27/2018   Lab Results  Component Value Date   TRIG 159 (H) 02/27/2018   Lab Results  Component Value Date   CHOLHDL 4.3 02/27/2018   Lab Results  Component Value Date   HGBA1C 5.2 06/21/2016      Assessment & Plan:   Problem List Items Addressed This Visit    None    Visit Diagnoses    Choking, initial encounter    -  Primary   Cough       Need for immunization against influenza        Relevant Orders   Flu Vaccine QUAD 36+ mos IM (Completed)     Cough/choking with some bloody sputum-unclear etiology.  Her throat actually looks good I do not see any lesions.  No pain swallowing.  I wonder if she just coughed so much she actually broke a blood vessel.  Lungs are completely clear on exam today pulse ox is 99% and reassuring.  She does have a chest CT scheduled in 12 days some get a hold off on just a plain film x-ray at this time since I do not hear anything by history or exam that is worrisome.  1 to minimize radiation to her chest if possible.  I did warn her that if at any point she feels like she is getting worse, develops new symptoms, develops a persistent cough, hot or has more bloody sputum or fever to please let us know immediately and we will go ahead and get an x-ray.  Flu vaccine given today.  No orders of the defined types were placed in this encounter.   Follow-up: Return if symptoms worsen or fail to improve.    Beatrice Lecher, MD

## 2018-11-13 ENCOUNTER — Encounter: Payer: Self-pay | Admitting: Family Medicine

## 2019-02-28 ENCOUNTER — Ambulatory Visit (INDEPENDENT_AMBULATORY_CARE_PROVIDER_SITE_OTHER): Payer: Medicare PPO | Admitting: Family Medicine

## 2019-02-28 ENCOUNTER — Other Ambulatory Visit: Payer: Self-pay

## 2019-02-28 ENCOUNTER — Encounter: Payer: Self-pay | Admitting: Family Medicine

## 2019-02-28 VITALS — BP 134/61 | HR 64 | Ht 66.0 in | Wt 209.0 lb

## 2019-02-28 DIAGNOSIS — Z23 Encounter for immunization: Secondary | ICD-10-CM

## 2019-02-28 DIAGNOSIS — C541 Malignant neoplasm of endometrium: Secondary | ICD-10-CM | POA: Diagnosis not present

## 2019-02-28 DIAGNOSIS — E876 Hypokalemia: Secondary | ICD-10-CM | POA: Diagnosis not present

## 2019-02-28 DIAGNOSIS — I10 Essential (primary) hypertension: Secondary | ICD-10-CM

## 2019-02-28 MED ORDER — POTASSIUM CHLORIDE ER 10 MEQ PO CPCR: ORAL_CAPSULE | ORAL | 1 refills | Status: DC

## 2019-02-28 MED ORDER — AMBULATORY NON FORMULARY MEDICATION: 0 refills | Status: DC

## 2019-02-28 NOTE — Progress Notes (Signed)
Established Patient Office Visit  Subjective:  Patient ID: Erika Strong, female    DOB: 01-31-1946  Age: 74 y.o. MRN: [id number]  CC:  Chief Complaint  Patient presents with  . Hypertension    HPI SHEVONNE WOLF presents for   Hypertension- Pt denies chest pain, SOB, dizziness, or heart palpitations.  Taking meds as directed w/o problems.  Denies medication side effects.    F/U Endometrial cancer, metastatic - Followed at [facility]  She was noted to have 5 lung nodules. So they changed her medication. She is off letrosol. She is is now on megace and tamoxifen.  She says on her repeat scan in December the nodules had gotten smaller.    Hypokalemia-she does need a refill on her potassium.  She says most of the time she takes it every other day she is due for blood work as well.  She also had some questions about the new shingles vaccine as well as about the Covid vaccine.   Past Medical History:  Diagnosis Date  . Anemia due to chemotherapy 01/23/2014  . Cancer ([place])   . Depression 10/22/2016  . Diverticulitis 2010  . Endometrial cancer ([place]) 11/02/2010  . Hepatic steatosis 02/12/2014  . Hyperlipidemia    2007  . Hypertension   . Malignant neoplasm of corpus uteri, except isthmus ([place]) 01/21/2011  . Wears glasses     Past Surgical History:  Procedure Laterality Date  . ABDOMINAL HYSTERECTOMY  11/24/2010   RTLH, BSO, RPLND, LPLNS  . GONADECTOMY AND HYSTERECTOMY  11/24/2010   Endometrial cancer, performed by Dr. Janie Morning  . MOLE REMOVAL  Nov 2012    Family History  Problem Relation Age of Onset  . Osteoporosis Mother   . Rheum arthritis Mother   . Cancer Mother   . Lung cancer Father        smoker  . Cancer Father     Social History   Socioeconomic History  . Marital status: Married    Spouse name: [name]  . Number of children: 1   . Years of education: Assoc degr  . Highest education level: Associate degree: academic program  Occupational History  .  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  Tobacco Use  . Smoking status: Never Smoker  . Smokeless tobacco: Never Used  Substance and Sexual Activity  . Alcohol use: No  . Drug use: No  . Sexual activity: Not Currently    Comment: cosmetologist, teaches, associate degree, married, regularly exercises.   Other Topics Concern  . Not on file  Social History Narrative   Walks daily for exercise. Still works part time doing hair twice a week. No caffeine use   Social Determinants of Health   Financial Resource Strain: Low Risk   . Difficulty of Paying Living Expenses: Not hard at all  Food Insecurity: No Food Insecurity  . Worried About Charity fundraiser in the Last Year: Never true  . Ran Out of Food in the Last Year: Never true  Transportation Needs: No Transportation Needs  . Lack of Transportation (Medical): No  . Lack of Transportation (Non-Medical): No  Physical Activity: Insufficiently Active  . Days of Exercise per Week: 7 days  . Minutes of Exercise per Session: 20 min  Stress: No Stress Concern Present  . Feeling of Stress : Not at all  Social Connections: Slightly Isolated  . Frequency of Communication with Friends and Family: More than  three times a week  . Frequency of Social Gatherings with Friends and Family: Once a week  . Attends Religious Services: More than 4 times per year  . Active Member of Clubs or Organizations: No  . Attends Archivist Meetings: Never  . Marital Status: Married  Human resources officer Violence: Not At Risk  . Fear of Current or Ex-Partner: No  . Emotionally Abused: No  . Physically Abused: No  . Sexually Abused: No    Outpatient Medications Prior to Visit  Medication Sig Dispense Refill  . megestrol (MEGACE) 40 MG tablet Take 80 mg by mouth 2 (two) times daily. Take 80 mg orally twice daily x 3 weeks then alternate with tamoxifen    . tamoxifen (NOLVADEX) 20 MG tablet Take 20  mg by mouth 2 (two) times daily. Take 1 tablet by mouth twice daily for 3 weeks, then alternate with Megace    . Ascorbic Acid (VITAMIN C CR) 1000 MG TBCR Take 1,000 mg by mouth daily.    . B Complex-C (B-COMPLEX WITH VITAMIN C) tablet Take 1 tablet by mouth daily.    . chlorthalidone (HYGROTON) 50 MG tablet Take 0.5 tablets (25 mg total) by mouth daily. 90 tablet 1  . CRANBERRY EXTRACT PO Take by mouth.    . letrozole (FEMARA) 2.5 MG tablet Take 2.5 mg by mouth.    Marland Kitchen OVER THE COUNTER MEDICATION Take 1 tablet by mouth daily. Curamin    . OVER THE COUNTER MEDICATION Take 3 tablets by mouth daily. New Chapter Bone Strength (calcium product)    . OVER THE COUNTER MEDICATION Take 1 packet by mouth 2 (two) times daily. Preston Fleeting Vitamin Pak (contains fish oil, potassium and multi vitamins)    . OVER THE COUNTER MEDICATION Take 3 tablets by mouth 3 (three) times daily. Intestinal Sooth and Build    . OVER THE COUNTER MEDICATION Take 2 tablets by mouth 2 (two) times daily. Stress J    . OVER THE COUNTER MEDICATION Take 1 tablet by mouth daily. Liver Care    . OVER THE COUNTER MEDICATION Take 10 mLs by mouth daily with breakfast. Liquid chlorophyl    . OVER THE COUNTER MEDICATION Take 30 mLs by mouth daily with breakfast. Flax Seed Oil    . OVER THE COUNTER MEDICATION Take 2 tablets by mouth 2 (two) times daily. Stress Gaurd    . PRADAXA 150 MG CAPS capsule TAKE 1 CAPSULE(150 MG) BY MOUTH TWICE DAILY 60 capsule 11  . Probiotic Product (PROBIOTIC DAILY) CAPS Take 1 capsule by mouth daily.    Marland Kitchen Ubiquinol 100 MG CAPS Take 100 mg by mouth daily with breakfast.     . UNABLE TO FIND Take by mouth.    . potassium chloride (MICRO-K) 10 MEQ CR capsule TAKE 1 CAPSULE(10 MEQ) BY MOUTH DAILY 90 capsule 1   No facility-administered medications prior to visit.    Allergies  Allergen Reactions  . Granix [Filgrastim]     Shot on [employer]. Developed chills and fever  . Atorvastatin Other (See Comments)      myalgias  . Fish Allergy Nausea And Vomiting  . Lisinopril Other (See Comments)     Cough  . Pravastatin Sodium Other (See Comments)     myalgias  . Simvastatin Other (See Comments)     myalgias  . Xarelto [Rivaroxaban] Other (See Comments)    Tachycardia  . Eliquis [Apixaban] Rash  . Keflex [Cephalexin] Rash    ROS Review of Systems  Objective:    Physical Exam  Constitutional: She is oriented to person, place, and time. She appears well-developed and well-nourished.  HENT:  Head: Normocephalic and atraumatic.  Cardiovascular: Normal rate, regular rhythm and normal heart sounds.  Radial pulse 2+   Pulmonary/Chest: Effort normal and breath sounds normal.  Neurological: She is alert and oriented to person, place, and time.  Skin: Skin is warm and dry.  Psychiatric: She has a normal mood and affect. Her behavior is normal.    BP 134/61   Pulse 64   Ht 5' 6"  (1.676 m)   Wt 209 lb (94.8 kg)   SpO2 98%   BMI 33.73 kg/m  Wt Readings from Last 3 Encounters:  02/28/19 209 lb (94.8 kg)  10/10/18 205 lb (93 kg)  08/28/18 203 lb (92.1 kg)     There are no preventive care reminders to display for this patient.  There are no preventive care reminders to display for this patient.  Lab Results  Component Value Date   TSH 2.70 02/27/2018   Lab Results  Component Value Date   WBC 8.1 02/27/2018   HGB 14.3 02/27/2018   HCT 42.7 02/27/2018   MCV 89.3 02/27/2018   PLT 272 02/27/2018   Lab Results  Component Value Date   NA 139 08/28/2018   K 3.7 08/28/2018   CHLORIDE 106 07/15/2014   CO2 29 08/28/2018   GLUCOSE 84 08/28/2018   BUN 14 08/28/2018   CREATININE 0.76 08/28/2018   BILITOT 0.6 08/28/2018   ALKPHOS 44 06/21/2016   AST 26 08/28/2018   ALT 17 08/28/2018   PROT 6.8 08/28/2018   ALBUMIN 3.8 06/21/2016   CALCIUM 9.9 08/28/2018   ANIONGAP 11 07/15/2014   EGFR 81 (L) 07/15/2014   Lab Results  Component Value Date   CHOL 238 (H) 02/27/2018   Lab  Results  Component Value Date   HDL 56 02/27/2018   Lab Results  Component Value Date   LDLCALC 153 (H) 02/27/2018   Lab Results  Component Value Date   TRIG 159 (H) 02/27/2018   Lab Results  Component Value Date   CHOLHDL 4.3 02/27/2018   Lab Results  Component Value Date   HGBA1C 5.2 06/21/2016      Assessment & Plan:   Problem List Items Addressed This Visit      Cardiovascular and Mediastinum   HYPERTENSION, BENIGN - Primary    Well controlled. Continue current regimen. Follow up in  6 mo      Relevant Orders   Lipid Panel w/reflex Direct LDL   TSH   CBC   CMP with Estimated GFR ([id number])     Genitourinary   Endometrial cancer (HCC)   Relevant Medications   megestrol (MEGACE) 40 MG tablet   tamoxifen (NOLVADEX) 20 MG tablet   Other Relevant Orders   Lipid Panel w/reflex Direct LDL   TSH   CBC   CMP with Estimated GFR ([id number])   Endometrial adenocarcinoma (HCC)    Recent medication change regimen because of new pulmonary lesions but they do seem to respond to the medication and she is tolerating the medications well.  Follows up with [facility] and has a follow-up in March.      Relevant Medications   megestrol (MEGACE) 40 MG tablet   tamoxifen (NOLVADEX) 20 MG tablet    Other Visit Diagnoses    Hypokalemia       Relevant Medications   potassium chloride (MICRO-K) 10 MEQ CR capsule  Need for shingles vaccine          Discussed for shingles vaccine.  Prescription printed and given so that she can take it to the pharmacy as it is covered under her Medicare part D.  Also discussed the Covid vaccine.  Meds ordered this encounter  Medications  . potassium chloride (MICRO-K) 10 MEQ CR capsule    Sig: TAKE 1 CAPSULE(10 MEQ) BY MOUTH DAILY    Dispense:  90 capsule    Refill:  1    **Patient requests 90 days supply**  . AMBULATORY NON FORMULARY MEDICATION    Sig: Medication Name: Shingrix Im x 1. Repeat in 2-6 months.  She would like to get COVID  vaccine when available.    Dispense:  1 vial    Refill:  0    Follow-up: Return in about 6 months (around 08/28/2019) for Hypertension.    Beatrice Lecher, MD

## 2019-02-28 NOTE — Assessment & Plan Note (Signed)
Recent medication change regimen because of new pulmonary lesions but they do seem to respond to the medication and she is tolerating the medications well.  Follows up with [facility] and has a follow-up in March.

## 2019-02-28 NOTE — Assessment & Plan Note (Signed)
Well controlled. Continue current regimen. Follow up in  6 mo  

## 2019-03-01 LAB — LIPID PANEL W/REFLEX DIRECT LDL
Cholesterol: 196 mg/dL (ref <200)
HDL: 45 mg/dL — ABNORMAL LOW (ref >=50)
LDL Cholesterol (Calc): 127 mg/dL (calc) — ABNORMAL HIGH
Non-HDL Cholesterol (Calc): 151 mg/dL (calc) — ABNORMAL HIGH (ref <130)
Total CHOL/HDL Ratio: 4.4 (calc) (ref <5.0)
Triglycerides: 129 mg/dL (ref <150)

## 2019-03-01 LAB — CBC
HCT: 41.2 % (ref 35.0–45.0)
Hemoglobin: 13.6 g/dL (ref 11.7–15.5)
MCH: 29.6 pg (ref 27.0–33.0)
MCHC: 33 g/dL (ref 32.0–36.0)
MCV: 89.8 fL (ref 80.0–100.0)
MPV: 10.3 fL (ref 7.5–12.5)
Platelets: 273 10*3/uL (ref 140–400)
RBC: 4.59 10*6/uL (ref 3.80–5.10)
RDW: 12.6 % (ref 11.0–15.0)
WBC: 9.3 10*3/uL (ref 3.8–10.8)

## 2019-03-01 LAB — COMPLETE METABOLIC PANEL WITH GFR
AG Ratio: 1.7 (calc) (ref 1.0–2.5)
ALT: 18 U/L (ref 6–29)
AST: 21 U/L (ref 10–35)
Albumin: 4.1 g/dL (ref 3.6–5.1)
Alkaline phosphatase (APISO): 39 U/L (ref 37–153)
BUN: 13 mg/dL (ref 7–25)
CO2: 28 mmol/L (ref 20–32)
Calcium: 9.6 mg/dL (ref 8.6–10.4)
Chloride: 103 mmol/L (ref 98–110)
Creat: 0.89 mg/dL (ref 0.60–0.93)
GFR, Est African American: 76 mL/min/{1.73_m2} (ref >=60)
GFR, Est Non African American: 65 mL/min/{1.73_m2} (ref >=60)
Globulin: 2.4 g/dL (calc) (ref 1.9–3.7)
Glucose, Bld: 85 mg/dL (ref 65–99)
Potassium: 4 mmol/L (ref 3.5–5.3)
Sodium: 139 mmol/L (ref 135–146)
Total Bilirubin: 0.7 mg/dL (ref 0.2–1.2)
Total Protein: 6.5 g/dL (ref 6.1–8.1)

## 2019-03-01 LAB — TSH: TSH: 2.18 mIU/L (ref 0.40–4.50)

## 2019-04-03 NOTE — Progress Notes (Signed)
Subjective:   Erika Strong is a 74 y.o. female who presents for Medicare Annual (Subsequent) preventive examination.  Review of Systems:  No ROS.  Medicare Wellness Virtual Visit.  Visual/audio telehealth visit, UTA vital signs.   See social history for additional risk factors.    Cardiac Risk Factors include: hypertension;advanced age (>63men, >58 women) Sleep patterns: Getting 7 hours of sleep a night.Wakes up 2-3 times a night to void. Wakes up feels rested and ready for the day.  Home Safety/Smoke Alarms: Feels safe in home. Smoke alarms in place.  Living environment; Lives with husband in 2 story home and no hand rails on the steps. Shower is  A walk in shower and bench in place. Seat Belt Safety/Bike Helmet: Wears seat belt.   Female:   Pap- Aged out      Mammo- scheduled for 08/28/19     Dexa scan- UTD       CCS- UTD     Objective:     Vitals: There were no vitals taken for this visit.  There is no height or weight on file to calculate BMI.  Advanced Directives 04/16/2019 04/12/2018 08/09/2014 07/15/2014 06/10/2014 06/06/2014 06/06/2014  Does Patient Have a Medical Advance Directive? No No No No No No No  Type of Advance Directive - - - - - - -  Copy of Healthcare Power of Attorney in Chart? - - - - - - -  Would patient like information on creating a medical advance directive? No - Patient declined No - Patient declined No - patient declined information No - patient declined information No - patient declined information - Yes Higher education careers adviser given    Tobacco Social History   Tobacco Use  Smoking Status Never Smoker  Smokeless Tobacco Never Used     Counseling given: Not Answered   Clinical Intake:  Pre-visit preparation completed: Yes  Pain : No/denies pain     Nutritional Risks: None Diabetes: No  How often do you need to have someone help you when you read instructions, pamphlets, or other written materials from your doctor or pharmacy?: 1 -  Never What is the last grade level you completed in school?: 14  Interpreter Needed?: No  Information entered by :: Orlie Dakin, LPN  Past Medical History:  Diagnosis Date  . Anemia due to chemotherapy 01/23/2014  . Cancer ([place])   . Depression 10/22/2016  . Diverticulitis 2010  . Endometrial cancer ([place]) 11/02/2010  . Hepatic steatosis 02/12/2014  . Hyperlipidemia    2007  . Hypertension   . Malignant neoplasm of corpus uteri, except isthmus ([place]) 01/21/2011  . Wears glasses    Past Surgical History:  Procedure Laterality Date  . ABDOMINAL HYSTERECTOMY  11/24/2010   RTLH, BSO, RPLND, LPLNS  . GONADECTOMY AND HYSTERECTOMY  11/24/2010   Endometrial cancer, performed by Dr. Janie Morning  . MOLE REMOVAL  Nov 2012   Family History  Problem Relation Age of Onset  . Osteoporosis Mother   . Rheum arthritis Mother   . Cancer Mother   . Lung cancer Father        smoker  . Cancer Father    Social History   Socioeconomic History  . Marital status: Married    Spouse name: [name]  . Number of children: 1   . Years of education: Assoc degr  . Highest education level: Associate degree: academic program  Occupational History  . Occupation: Engineer, maintenance    Comment: retired  Employer: [employer]  Tobacco Use  . Smoking status: Never Smoker  . Smokeless tobacco: Never Used  Substance and Sexual Activity  . Alcohol use: No  . Drug use: No  . Sexual activity: Not Currently    Comment: cosmetologist, teaches, associate degree, married, regularly exercises.   Other Topics Concern  . Not on file  Social History Narrative   Walks daily for exercise. Still works part time doing hair twice a week. No caffeine use   Social Determinants of Radio broadcast assistant Strain:   . Difficulty of Paying Living Expenses: Not on file  Food Insecurity:   . Worried About Charity fundraiser in the Last Year: Not on file  . Ran Out of Food in the Last Year: Not on  file  Transportation Needs:   . Lack of Transportation (Medical): Not on file  . Lack of Transportation (Non-Medical): Not on file  Physical Activity:   . Days of Exercise per Week: Not on file  . Minutes of Exercise per Session: Not on file  Stress:   . Feeling of Stress : Not on file  Social Connections:   . Frequency of Communication with Friends and Family: Not on file  . Frequency of Social Gatherings with Friends and Family: Not on file  . Attends Religious Services: Not on file  . Active Member of Clubs or Organizations: Not on file  . Attends Archivist Meetings: Not on file  . Marital Status: Not on file    Outpatient Encounter Medications as of 04/16/2019  Medication Sig  . AMBULATORY NON FORMULARY MEDICATION Medication Name: Shingrix Im x 1. Repeat in 2-6 months.  She would like to get COVID vaccine when available.  . Ascorbic Acid (VITAMIN C CR) 1000 MG TBCR Take 1,000 mg by mouth daily.  . B Complex-C (B-COMPLEX WITH VITAMIN C) tablet Take 1 tablet by mouth daily.  . chlorthalidone (HYGROTON) 50 MG tablet Take 0.5 tablets (25 mg total) by mouth daily.  Marland Kitchen CRANBERRY EXTRACT PO Take by mouth.  . megestrol (MEGACE) 40 MG tablet Take 80 mg by mouth 2 (two) times daily. Take 80 mg orally twice daily x 3 weeks then alternate with tamoxifen  . OVER THE COUNTER MEDICATION Take 1 tablet by mouth daily. Curamin  . OVER THE COUNTER MEDICATION Take 3 tablets by mouth daily. New Chapter Bone Strength (calcium product)  . OVER THE COUNTER MEDICATION Take 1 packet by mouth 2 (two) times daily. Preston Fleeting Vitamin Pak (contains fish oil, potassium and multi vitamins)  . OVER THE COUNTER MEDICATION Take 3 tablets by mouth 3 (three) times daily. Intestinal Sooth and Build  . OVER THE COUNTER MEDICATION Take 2 tablets by mouth 2 (two) times daily. Stress J  . OVER THE COUNTER MEDICATION Take 1 tablet by mouth daily. Liver Care  . OVER THE COUNTER MEDICATION Take 10 mLs by mouth  daily with breakfast. Liquid chlorophyl  . OVER THE COUNTER MEDICATION Take 30 mLs by mouth daily with breakfast. Flax Seed Oil  . OVER THE COUNTER MEDICATION Take 2 tablets by mouth 2 (two) times daily. Stress Gaurd  . potassium chloride (MICRO-K) 10 MEQ CR capsule TAKE 1 CAPSULE(10 MEQ) BY MOUTH DAILY  . PRADAXA 150 MG CAPS capsule TAKE 1 CAPSULE(150 MG) BY MOUTH TWICE DAILY  . Probiotic Product (PROBIOTIC DAILY) CAPS Take 1 capsule by mouth daily.  . tamoxifen (NOLVADEX) 20 MG tablet Take 20 mg by mouth 2 (two) times  daily. Take 1 tablet by mouth twice daily for 3 weeks, then alternate with Megace  . Ubiquinol 100 MG CAPS Take 100 mg by mouth daily with breakfast.   . UNABLE TO FIND Take by mouth.  . letrozole (FEMARA) 2.5 MG tablet Take 2.5 mg by mouth.   No facility-administered encounter medications on file as of 04/16/2019.    Activities of Daily Living In your present state of health, do you have any difficulty performing the following activities: 04/16/2019  Hearing? N  Vision? N  Difficulty concentrating or making decisions? N  Walking or climbing stairs? N  Dressing or bathing? N  Doing errands, shopping? N  Preparing Food and eating ? N  Using the Toilet? N  In the past six months, have you accidently leaked urine? N  Do you have problems with loss of bowel control? N  Managing your Medications? N  Managing your Finances? N  Housekeeping or managing your Housekeeping? N  Some recent data might be hidden    Patient Care Team: Hali Marry, MD as PCP - De Burrs, MD as Attending Physician (Obstetrics and Gynecology) Mellody Drown, MD as Referring Physician (Obstetrics and Gynecology)    Assessment:   This is a routine wellness examination for [place].Physical assessment deferred to PCP.   Exercise Activities and Dietary recommendations Current Exercise Habits: Home exercise routine, Type of exercise: walking, Time (Minutes): 30, Frequency  (Times/Week): 6, Weekly Exercise (Minutes/Week): 180, Intensity: Mild, Exercise limited by: None identified Diet Eats a healthy diet of vegetables, fruits and proteins. Breakfast:oatmeal with banana and milk and flaxseed and coffee Lunch: Meat and vegetables Dinner:  Sandwich also does a smoothie during the day packed with fruits, carrots, celery.   Drinks 8 glasses of water daily  Goals    . Patient Stated     Patient stated that she wanted to continue to improve her health in 2020.  Would like for the cancer to be gone.       Fall Risk Fall Risk  04/16/2019 08/28/2018 04/12/2018 02/27/2018 10/29/2016  Falls in the past year? 0 1 1 0 No  Number falls in past yr: - 0 0 0 -  Injury with Fall? - 0 0 - -  Risk for fall due to : No Fall Risks - - - -  Follow up Falls prevention discussed - Falls prevention discussed - -   Is the patient's home free of loose throw rugs in walkways, pet beds, electrical cords, etc?   yes      Grab bars in the bathroom? no      Handrails on the stairs?   yes      Adequate lighting?   yes  Depression Screen PHQ 2/9 Scores 04/16/2019 02/28/2019 08/28/2018 04/12/2018  PHQ - 2 Score 0 0 0 0  PHQ- 9 Score - - - -     Cognitive Function     6CIT Screen 04/16/2019 04/12/2018  What Year? 0 points 0 points  What month? 0 points 0 points  What time? 0 points 0 points  Count back from 20 0 points 0 points  Months in reverse 0 points 0 points  Repeat phrase 0 points 2 points  Total Score 0 2    Immunization History  Administered Date(s) Administered  . Influenza Split 01/01/2011, 01/11/2012  . Influenza Whole 11/30/2007, 11/22/2008  . Influenza,inj,Quad PF,6+ Mos 12/07/2012, 10/31/2013, 11/19/2014, 12/22/2015, 12/21/2016, 11/15/2017, 10/10/2018  . Pneumococcal Conjugate-13 08/09/2014  . Pneumococcal Polysaccharide-23 09/22/2012  .  Td 04/13/2007  . Tdap 05/02/2017  . Zoster 03/19/2009    Screening Tests Health Maintenance  Topic Date Due  . COLONOSCOPY   07/03/2019  . MAMMOGRAM  08/20/2020  . TETANUS/TDAP  05/03/2027  . INFLUENZA VACCINE  Completed  . DEXA SCAN  Completed  . Hepatitis C Screening  Completed  . PNA vac Low Risk Adult  Completed      Plan:    Please schedule your next medicare wellness visit with me in 1 yr.  Ms. Braggs , Thank you for taking time to come for your Medicare Wellness Visit. I appreciate your ongoing commitment to your health goals. Please review the following plan we discussed and let me know if I can assist you in the future.  Continue doing brain stimulating activities (puzzles, reading, adult coloring books, staying active) to keep memory sharp.    These are the goals we discussed: Goals    . Patient Stated     Patient stated that she wanted to continue to improve her health in 2020.  Would like for the cancer to be gone.       This is a list of the screening recommended for you and due dates:  Health Maintenance  Topic Date Due  . Colon Cancer Screening  07/03/2019  . Mammogram  08/20/2020  . Tetanus Vaccine  05/03/2027  . Flu Shot  Completed  . DEXA scan (bone density measurement)  Completed  .  Hepatitis C: One time screening is recommended by Center for Disease Control  (CDC) for  adults born from [birth year] through 1963.   Completed  . Pneumonia vaccines  Completed      I have personally reviewed and noted the following in the patient's chart:   . Medical and social history . Use of alcohol, tobacco or illicit drugs  . Current medications and supplements . Functional ability and status . Nutritional status . Physical activity . Advanced directives . List of other physicians . Hospitalizations, surgeries, and ER visits in previous 12 months . Vitals . Screenings to include cognitive, depression, and falls . Referrals and appointments  In addition, I have reviewed and discussed with patient certain preventive protocols, quality metrics, and best practice recommendations. A written  personalized care plan for preventive services as well as general preventive health recommendations were provided to patient.     Joanne Chars, LPN  [phone]

## 2019-04-16 ENCOUNTER — Other Ambulatory Visit: Payer: Self-pay

## 2019-04-16 ENCOUNTER — Ambulatory Visit (INDEPENDENT_AMBULATORY_CARE_PROVIDER_SITE_OTHER): Payer: Medicare PPO | Admitting: *Deleted

## 2019-04-16 VITALS — BP 137/56 | HR 75 | Ht 66.0 in | Wt 212.0 lb

## 2019-04-16 DIAGNOSIS — Z0000 Encounter for general adult medical examination without abnormal findings: Secondary | ICD-10-CM | POA: Diagnosis not present

## 2019-04-16 NOTE — Patient Instructions (Addendum)
Please schedule your next medicare wellness visit with me in 1 yr.  Erika Strong , Thank you for taking time to come for your Medicare Wellness Visit. I appreciate your ongoing commitment to your health goals. Please review the following plan we discussed and let me know if I can assist you in the future.  Continue doing brain stimulating activities (puzzles, reading, adult coloring books, staying active) to keep memory sharp.   These are the goals we discussed: Goals    . Patient Stated     Patient stated that she wanted to continue to improve her health in 2020.  Would like for the cancer to be gone.

## 2019-05-11 DIAGNOSIS — C541 Malignant neoplasm of endometrium: Secondary | ICD-10-CM | POA: Diagnosis not present

## 2019-07-25 ENCOUNTER — Telehealth: Payer: Self-pay

## 2019-07-25 NOTE — Telephone Encounter (Signed)
Pt called to inform us that she has been instructed to stop Pradaxa on 6/28 in preparation for a colonoscopy on 08/22/2019.  Advised pt that if this what her GI recommended then she should follow their directions.  Pt also stated that she thinks that it is Dr. Elease Hashimoto that really wants her to continue this medication.  Advised pt that she should inform Dr. Elease Hashimoto as well.  Pt expressed understanding and is agreeable.  Charyl Bigger, CMA

## 2019-07-30 DIAGNOSIS — L821 Other seborrheic keratosis: Secondary | ICD-10-CM | POA: Diagnosis not present

## 2019-07-30 DIAGNOSIS — L814 Other melanin hyperpigmentation: Secondary | ICD-10-CM | POA: Diagnosis not present

## 2019-07-30 DIAGNOSIS — L570 Actinic keratosis: Secondary | ICD-10-CM | POA: Diagnosis not present

## 2019-07-30 DIAGNOSIS — X32XXXS Exposure to sunlight, sequela: Secondary | ICD-10-CM | POA: Diagnosis not present

## 2019-08-03 DIAGNOSIS — Z86718 Personal history of other venous thrombosis and embolism: Secondary | ICD-10-CM | POA: Diagnosis not present

## 2019-08-03 DIAGNOSIS — I4891 Unspecified atrial fibrillation: Secondary | ICD-10-CM | POA: Diagnosis not present

## 2019-08-03 DIAGNOSIS — R918 Other nonspecific abnormal finding of lung field: Secondary | ICD-10-CM | POA: Diagnosis not present

## 2019-08-03 DIAGNOSIS — C7801 Secondary malignant neoplasm of right lung: Secondary | ICD-10-CM | POA: Diagnosis not present

## 2019-08-03 DIAGNOSIS — C7800 Secondary malignant neoplasm of unspecified lung: Secondary | ICD-10-CM | POA: Diagnosis not present

## 2019-08-03 DIAGNOSIS — C7802 Secondary malignant neoplasm of left lung: Secondary | ICD-10-CM | POA: Diagnosis not present

## 2019-08-03 DIAGNOSIS — Z86711 Personal history of pulmonary embolism: Secondary | ICD-10-CM | POA: Diagnosis not present

## 2019-08-03 DIAGNOSIS — Z7901 Long term (current) use of anticoagulants: Secondary | ICD-10-CM | POA: Diagnosis not present

## 2019-08-03 DIAGNOSIS — Z79899 Other long term (current) drug therapy: Secondary | ICD-10-CM | POA: Diagnosis not present

## 2019-08-03 DIAGNOSIS — C7951 Secondary malignant neoplasm of bone: Secondary | ICD-10-CM | POA: Diagnosis not present

## 2019-08-03 DIAGNOSIS — C541 Malignant neoplasm of endometrium: Secondary | ICD-10-CM | POA: Diagnosis not present

## 2019-08-07 ENCOUNTER — Telehealth: Payer: Self-pay

## 2019-08-07 NOTE — Telephone Encounter (Signed)
Patient called stating she has colonoscopy scheduled for 08/22/19 at [facility].   GI wants Dr Madilyn Fireman to advise on directions for Pradaxa prior to procedure. Patient reports being told to stop Pradaxa 2 days prior to procedure, but wants calcification from Dr Madilyn Fireman and also needs directions for medication management faxed to GI.   Please advise

## 2019-08-07 NOTE — Telephone Encounter (Signed)
I agree, hold Pradaxa for 2 days prior to procedure so for example if procedure is on a Wednesday morning she will not take her Pradaxa on Monday and Tuesday.  Created a quick letter to fax to GI.  It actually has a fairly short half-life so couple of days off the medication and it should be mostly out of her system.

## 2019-08-08 NOTE — Telephone Encounter (Signed)
Patient advised and letter faxed.

## 2019-08-09 ENCOUNTER — Other Ambulatory Visit: Payer: Self-pay | Admitting: Family Medicine

## 2019-08-14 NOTE — Telephone Encounter (Signed)
Patient made aware of results/recommendations. 

## 2019-08-14 NOTE — Telephone Encounter (Signed)
Ok to restart that night.

## 2019-08-14 NOTE — Telephone Encounter (Signed)
Patient left a vm asking when she should restart Pradaxa after her procedure.   She states she usually takes it at 8 am and 8 pm.  Should she take it Tuesday morning when she gets home or restart at night? Please advise.

## 2019-08-22 DIAGNOSIS — Z1211 Encounter for screening for malignant neoplasm of colon: Secondary | ICD-10-CM | POA: Diagnosis not present

## 2019-08-22 DIAGNOSIS — K621 Rectal polyp: Secondary | ICD-10-CM | POA: Diagnosis not present

## 2019-08-22 LAB — HM COLONOSCOPY

## 2019-08-22 NOTE — Telephone Encounter (Signed)
Patient called, states she had colonoscopy today and had a polyp removed. She said the Dr advised her not to start Pradaxa back until Friday,she wanted to make sure that Dr Madilyn Fireman was agreeable with that.   I advised patient to follow her GI providers recommendation since he did remove a polyp. She was agreeable and will do as he advises.   FYI to covering provider

## 2019-08-28 ENCOUNTER — Other Ambulatory Visit: Payer: Self-pay

## 2019-08-28 ENCOUNTER — Ambulatory Visit
Admission: RE | Admit: 2019-08-28 | Discharge: 2019-08-28 | Disposition: A | Discharge status: Home / Self Care | Payer: Medicare PPO | Source: Ambulatory Visit | Attending: Family Medicine | Admitting: Family Medicine

## 2019-08-28 DIAGNOSIS — Z1231 Encounter for screening mammogram for malignant neoplasm of breast: Secondary | ICD-10-CM

## 2019-08-29 ENCOUNTER — Encounter: Payer: Self-pay | Admitting: Family Medicine

## 2019-08-29 ENCOUNTER — Ambulatory Visit (INDEPENDENT_AMBULATORY_CARE_PROVIDER_SITE_OTHER): Payer: Medicare PPO | Admitting: Family Medicine

## 2019-08-29 VITALS — BP 136/62 | HR 72 | Ht 66.0 in | Wt 212.0 lb

## 2019-08-29 DIAGNOSIS — Z7901 Long term (current) use of anticoagulants: Secondary | ICD-10-CM

## 2019-08-29 DIAGNOSIS — C541 Malignant neoplasm of endometrium: Secondary | ICD-10-CM

## 2019-08-29 DIAGNOSIS — Z5181 Encounter for therapeutic drug level monitoring: Secondary | ICD-10-CM

## 2019-08-29 DIAGNOSIS — I10 Essential (primary) hypertension: Secondary | ICD-10-CM

## 2019-08-29 DIAGNOSIS — C7800 Secondary malignant neoplasm of unspecified lung: Secondary | ICD-10-CM | POA: Diagnosis not present

## 2019-08-29 DIAGNOSIS — C7989 Secondary malignant neoplasm of other specified sites: Secondary | ICD-10-CM | POA: Diagnosis not present

## 2019-08-29 DIAGNOSIS — E876 Hypokalemia: Secondary | ICD-10-CM

## 2019-08-29 LAB — COMPLETE METABOLIC PANEL WITH GFR
AG Ratio: 1.5 (calc) (ref 1.0–2.5)
ALT: 20 U/L (ref 6–29)
AST: 21 U/L (ref 10–35)
Albumin: 3.8 g/dL (ref 3.6–5.1)
Alkaline phosphatase (APISO): 32 U/L — ABNORMAL LOW (ref 37–153)
BUN: 13 mg/dL (ref 7–25)
CO2: 28 mmol/L (ref 20–32)
Calcium: 9.3 mg/dL (ref 8.6–10.4)
Chloride: 102 mmol/L (ref 98–110)
Creat: 0.86 mg/dL (ref 0.60–0.93)
GFR, Est African American: 78 mL/min/{1.73_m2} (ref >=60)
GFR, Est Non African American: 67 mL/min/{1.73_m2} (ref >=60)
Globulin: 2.5 g/dL (calc) (ref 1.9–3.7)
Glucose, Bld: 84 mg/dL (ref 65–139)
Potassium: 4.1 mmol/L (ref 3.5–5.3)
Sodium: 137 mmol/L (ref 135–146)
Total Bilirubin: 0.5 mg/dL (ref 0.2–1.2)
Total Protein: 6.3 g/dL (ref 6.1–8.1)

## 2019-08-29 LAB — CBC
HCT: 40 % (ref 35.0–45.0)
Hemoglobin: 13.5 g/dL (ref 11.7–15.5)
MCH: 30.4 pg (ref 27.0–33.0)
MCHC: 33.8 g/dL (ref 32.0–36.0)
MCV: 90.1 fL (ref 80.0–100.0)
MPV: 10.8 fL (ref 7.5–12.5)
Platelets: 232 10*3/uL (ref 140–400)
RBC: 4.44 10*6/uL (ref 3.80–5.10)
RDW: 12.1 % (ref 11.0–15.0)
WBC: 8.3 10*3/uL (ref 3.8–10.8)

## 2019-08-29 MED ORDER — TAMOXIFEN CITRATE 20 MG PO TABS: 20.0000 mg | ORAL_TABLET | Freq: Two times a day (BID) | ORAL | 1 refills | Status: DC

## 2019-08-29 MED ORDER — POTASSIUM CHLORIDE ER 10 MEQ PO CPCR: 10.0000 meq | ORAL_CAPSULE | ORAL | 1 refills | Status: DC

## 2019-08-29 NOTE — Assessment & Plan Note (Addendum)
Will continue Megace at least until age 74.

## 2019-08-29 NOTE — Assessment & Plan Note (Signed)
Well controlled. Continue current regimen. Follow up in  6 mo  

## 2019-08-29 NOTE — Assessment & Plan Note (Signed)
Stable on most recent CT scan done at [facility].  Followed by Dr. Fransisca Connors

## 2019-08-29 NOTE — Assessment & Plan Note (Signed)
continue pradaxa 150 mg mg every 12 hours, indefinitely as long as she tolerates it.

## 2019-08-29 NOTE — Progress Notes (Signed)
Established Patient Office Visit  Subjective:  Patient ID: Erika Strong, female    DOB: 17-Mar-1945  Age: 74 y.o. MRN: [id number]  CC:  Chief Complaint  Patient presents with   Hypertension    HPI [name] presents for   Hypertension- Pt denies chest pain, SOB, dizziness, or heart palpitations.  Taking meds as directed w/o problems.  Denies medication side effects.   She let me know that her hematologist will be leaving [facility].  She was last seen about a year ago.  I actually write for her Pradaxa but she is been going yearly to meet with a hematologist.  They have recommended indefinite treatment because of her active cancer history and her multiple emboli.  She was also being followed for about 5 pulmonary nodules.  Initially there was some change in one of the nodules but her latest scan showed no change from prior to the just continuing to monitor.  She actually just had her follow-up mammogram yesterday fortunately she has been changed back to screening mammograms yearly which is absolutely fantastic so next year she can actually have it done downstairs at our location still having to go to [place].  She has had her Covid vaccine.  Past Medical History:  Diagnosis Date   Anemia due to chemotherapy 01/23/2014   Cancer ([place])    Depression 10/22/2016   Diverticulitis 2010   Endometrial cancer ([place]) 11/02/2010   Hepatic steatosis 02/12/2014   Hyperlipidemia    2007   Hypertension    Malignant neoplasm of corpus uteri, except isthmus ([place]) 01/21/2011   Wears glasses     Past Surgical History:  Procedure Laterality Date   ABDOMINAL HYSTERECTOMY  11/24/2010   RTLH, BSO, RPLND, LPLNS   GONADECTOMY AND HYSTERECTOMY  11/24/2010   Endometrial cancer, performed by Dr. Janie Morning   MOLE REMOVAL  Nov 2012    Family History  Problem Relation Age of Onset   Osteoporosis Mother    Rheum arthritis Mother    Cancer Mother    Lung cancer Father         smoker   Cancer Father     Social History   Socioeconomic History   Marital status: Married    Spouse name: [name]   Number of children: 1    Years of education: Assoc degr   Highest education level: Associate degree: academic program  Occupational History   Occupation: Engineer, maintenance    Comment: retired    Fish farm manager: [place]  Tobacco Use   Smoking status: Never Smoker   Smokeless tobacco: Never Used  Scientific laboratory technician Use: Never used  Substance and Sexual Activity   Alcohol use: No   Drug use: No   Sexual activity: Not Currently    Comment: cosmetologist, teaches, associate degree, married, regularly exercises.   Other Topics Concern   Not on file  Social History Narrative   Walks daily for exercise. Still works part time doing hair twice a week. No caffeine use   Social Determinants of Radio broadcast assistant Strain:    Difficulty of Paying Living Expenses:   Food Insecurity:    Worried About Charity fundraiser in the Last Year:    Arboriculturist in the Last Year:   Transportation Needs:    Film/video editor (Medical):    Lack of Transportation (Non-Medical):   Physical Activity:    Days of Exercise per Week:    Minutes of  Exercise per Session:   Stress:    Feeling of Stress :   Social Connections:    Frequency of Communication with Friends and Family:    Frequency of Social Gatherings with Friends and Family:    Attends Religious Services:    Active Member of Clubs or Organizations:    Attends Music therapist:    Marital Status:   Intimate Partner Violence:    Fear of Current or Ex-Partner:    Emotionally Abused:    Physically Abused:    Sexually Abused:     Outpatient Medications Prior to Visit  Medication Sig Dispense Refill   AMBULATORY NON FORMULARY MEDICATION Medication Name: daily vibrover powder probiotic     Ascorbic Acid (VITAMIN C CR) 1000 MG TBCR Take 1,000  mg by mouth daily.     B Complex-C (B-COMPLEX WITH VITAMIN C) tablet Take 1 tablet by mouth daily.     chlorthalidone (HYGROTON) 50 MG tablet Take 0.5 tablets (25 mg total) by mouth daily. 90 tablet 1   CRANBERRY EXTRACT PO Take by mouth.     megestrol (MEGACE) 40 MG tablet Take 80 mg by mouth 2 (two) times daily. Take 80 mg orally twice daily x 3 weeks then alternate with tamoxifen     OVER THE COUNTER MEDICATION Take 1 tablet by mouth daily. Curamin     OVER THE COUNTER MEDICATION Take 3 tablets by mouth daily. [place] Bone Strength (calcium product)     OVER THE COUNTER MEDICATION Take 1 packet by mouth 2 (two) times daily. [place] Fleeting Vitamin Pak (contains fish oil, potassium and multi vitamins)     OVER THE COUNTER MEDICATION Take 3 tablets by mouth 3 (three) times daily. Intestinal Sooth and Build     OVER THE COUNTER MEDICATION Take 2 tablets by mouth 2 (two) times daily. Stress J     OVER THE COUNTER MEDICATION Take 1 tablet by mouth daily. Liver Care     OVER THE COUNTER MEDICATION Take 10 mLs by mouth daily with breakfast. Liquid chlorophyl     OVER THE COUNTER MEDICATION Take 30 mLs by mouth daily with breakfast. Flax Seed Oil     OVER THE COUNTER MEDICATION Take 2 tablets by mouth 2 (two) times daily. Stress Gaurd     PRADAXA 150 MG CAPS capsule TAKE ONE CAPSULE BY MOUTH TWICE DAILY 60 capsule 1   RESTASIS 0.05 % ophthalmic emulsion 1 drop 2 (two) times daily.     Ubiquinol 100 MG CAPS Take 100 mg by mouth daily with breakfast.      UNABLE TO FIND Take by mouth.     potassium chloride (MICRO-K) 10 MEQ CR capsule TAKE 1 CAPSULE(10 MEQ) BY MOUTH DAILY 90 capsule 1   tamoxifen (NOLVADEX) 20 MG tablet Take 20 mg by mouth 2 (two) times daily. Take 1 tablet by mouth twice daily for 3 weeks, then alternate with Megace     AMBULATORY NON FORMULARY MEDICATION Medication Name: Shingrix Im x 1. Repeat in 2-6 months.  She would like to get COVID vaccine when available.  1 vial 0   letrozole (FEMARA) 2.5 MG tablet Take 2.5 mg by mouth.     Probiotic Product (PROBIOTIC DAILY) CAPS Take 1 capsule by mouth daily.     No facility-administered medications prior to visit.    Allergies  Allergen Reactions   Granix [Filgrastim]     Shot on Tues. Developed chills and fever   Atorvastatin Other (See Comments)  myalgias   Fish Allergy Nausea And Vomiting   Lisinopril Other (See Comments)     Cough   Pravastatin Sodium Other (See Comments)     myalgias   Simvastatin Other (See Comments)     myalgias   Xarelto [Rivaroxaban] Other (See Comments)    Tachycardia   Eliquis [Apixaban] Rash   Keflex [Cephalexin] Rash    ROS Review of Systems    Objective:    Physical Exam Constitutional:      Appearance: She is well-developed.  HENT:     Head: Normocephalic and atraumatic.  Cardiovascular:     Rate and Rhythm: Normal rate and regular rhythm.     Heart sounds: Normal heart sounds.  Pulmonary:     Effort: Pulmonary effort is normal.     Breath sounds: Normal breath sounds.  Skin:    General: Skin is warm and dry.  Neurological:     Mental Status: She is alert and oriented to person, place, and time.  Psychiatric:        Behavior: Behavior normal.     BP 136/62    Pulse 72    Ht 5' 6"  (1.676 m)    Wt 212 lb (96.2 kg)    SpO2 99%    BMI 34.22 kg/m  Wt Readings from Last 3 Encounters:  08/29/19 212 lb (96.2 kg)  04/16/19 212 lb (96.2 kg)  02/28/19 209 lb (94.8 kg)     Health Maintenance Due  Topic Date Due   COLONOSCOPY  07/03/2019    There are no preventive care reminders to display for this patient.  Lab Results  Component Value Date   TSH 2.18 02/28/2019   Lab Results  Component Value Date   WBC 9.3 02/28/2019   HGB 13.6 02/28/2019   HCT 41.2 02/28/2019   MCV 89.8 02/28/2019   PLT 273 02/28/2019   Lab Results  Component Value Date   NA 139 02/28/2019   K 4.0 02/28/2019   CHLORIDE 106 07/15/2014   CO2 28  02/28/2019   GLUCOSE 85 02/28/2019   BUN 13 02/28/2019   CREATININE 0.89 02/28/2019   BILITOT 0.7 02/28/2019   ALKPHOS 44 06/21/2016   AST 21 02/28/2019   ALT 18 02/28/2019   PROT 6.5 02/28/2019   ALBUMIN 3.8 06/21/2016   CALCIUM 9.6 02/28/2019   ANIONGAP 11 07/15/2014   EGFR 81 (L) 07/15/2014   Lab Results  Component Value Date   CHOL 196 02/28/2019   Lab Results  Component Value Date   HDL 45 (L) 02/28/2019   Lab Results  Component Value Date   LDLCALC 127 (H) 02/28/2019   Lab Results  Component Value Date   TRIG 129 02/28/2019   Lab Results  Component Value Date   CHOLHDL 4.4 02/28/2019   Lab Results  Component Value Date   HGBA1C 5.2 06/21/2016      Assessment & Plan:   Problem List Items Addressed This Visit      Cardiovascular and Mediastinum   HYPERTENSION, BENIGN - Primary    Well controlled. Continue current regimen. Follow up in  6 mo      Relevant Orders   COMPLETE METABOLIC PANEL WITH GFR   CBC     Respiratory   Pulmonary metastases (HCC)    Stable on most recent CT scan done at [facility].  Followed by Dr. Fransisca Connors      Relevant Medications   tamoxifen (NOLVADEX) 20 MG tablet     Genitourinary  Endometrial cancer ([place])   Relevant Medications   tamoxifen (NOLVADEX) 20 MG tablet   Endometrial adenocarcinoma ([place])    Will continue Megace at least until age 82.        Relevant Medications   tamoxifen (NOLVADEX) 20 MG tablet     Other   Metastatic cancer to pelvis (HCC)   Relevant Medications   tamoxifen (NOLVADEX) 20 MG tablet   Anticoagulation management encounter s/p bilat pul emboli    continue pradaxa 150 mg mg every 12 hours, indefinitely as long as she tolerates it.        Other Visit Diagnoses    Hypokalemia       Relevant Medications   potassium chloride (MICRO-K) 10 MEQ CR capsule      Meds ordered this encounter  Medications   potassium chloride (MICRO-K) 10 MEQ CR capsule    Sig: Take 1 capsule (10 mEq total)  by mouth every other day.    Dispense:  45 capsule    Refill:  1    **Patient requests 90 days supply**   tamoxifen (NOLVADEX) 20 MG tablet    Sig: Take 1 tablet (20 mg total) by mouth 2 (two) times daily. Take 1 tablet by mouth twice daily for 3 weeks, then alternate with Megace    Dispense:  180 tablet    Refill:  1    Follow-up: Return in about 6 months (around 02/29/2020) for Hypertension and labs.    Beatrice Lecher, MD

## 2019-08-30 NOTE — Progress Notes (Signed)
All labs are normal. 

## 2019-10-19 ENCOUNTER — Other Ambulatory Visit: Payer: Self-pay | Admitting: Family Medicine

## 2019-11-02 DIAGNOSIS — C541 Malignant neoplasm of endometrium: Secondary | ICD-10-CM | POA: Diagnosis not present

## 2019-11-02 DIAGNOSIS — Z5181 Encounter for therapeutic drug level monitoring: Secondary | ICD-10-CM | POA: Diagnosis not present

## 2019-11-02 DIAGNOSIS — Z79899 Other long term (current) drug therapy: Secondary | ICD-10-CM | POA: Diagnosis not present

## 2019-11-07 DIAGNOSIS — H04123 Dry eye syndrome of bilateral lacrimal glands: Secondary | ICD-10-CM | POA: Diagnosis not present

## 2019-11-12 ENCOUNTER — Other Ambulatory Visit: Payer: Self-pay

## 2019-11-12 ENCOUNTER — Ambulatory Visit (INDEPENDENT_AMBULATORY_CARE_PROVIDER_SITE_OTHER): Payer: Medicare PPO | Admitting: Family Medicine

## 2019-11-12 DIAGNOSIS — Z23 Encounter for immunization: Secondary | ICD-10-CM | POA: Diagnosis not present

## 2020-01-14 ENCOUNTER — Ambulatory Visit (INDEPENDENT_AMBULATORY_CARE_PROVIDER_SITE_OTHER): Payer: Medicare PPO

## 2020-01-14 ENCOUNTER — Other Ambulatory Visit: Payer: Self-pay

## 2020-01-14 ENCOUNTER — Ambulatory Visit (INDEPENDENT_AMBULATORY_CARE_PROVIDER_SITE_OTHER): Payer: Medicare PPO | Admitting: Nurse Practitioner

## 2020-01-14 ENCOUNTER — Encounter: Payer: Self-pay | Admitting: Nurse Practitioner

## 2020-01-14 VITALS — BP 142/76 | HR 93 | Temp 98.1°F | Ht 66.0 in | Wt 231.1 lb

## 2020-01-14 DIAGNOSIS — S0083XA Contusion of other part of head, initial encounter: Secondary | ICD-10-CM | POA: Diagnosis not present

## 2020-01-14 DIAGNOSIS — W19XXXA Unspecified fall, initial encounter: Secondary | ICD-10-CM | POA: Diagnosis not present

## 2020-01-14 DIAGNOSIS — S0091XA Abrasion of unspecified part of head, initial encounter: Secondary | ICD-10-CM

## 2020-01-14 DIAGNOSIS — S0001XA Abrasion of scalp, initial encounter: Secondary | ICD-10-CM | POA: Diagnosis not present

## 2020-01-14 DIAGNOSIS — W010XXA Fall on same level from slipping, tripping and stumbling without subsequent striking against object, initial encounter: Secondary | ICD-10-CM

## 2020-01-14 HISTORY — DX: Unspecified fall, initial encounter: W19.XXXA

## 2020-01-14 MED ORDER — SULFAMETHOXAZOLE-TRIMETHOPRIM 800-160 MG PO TABS: 1.0000 | ORAL_TABLET | Freq: Two times a day (BID) | ORAL | 0 refills | Status: DC

## 2020-01-14 MED ORDER — FLUCONAZOLE 150 MG PO TABS
150.0000 mg | ORAL_TABLET | Freq: Once | ORAL | 0 refills | Status: AC
Start: 2020-01-14 — End: 2020-01-14

## 2020-01-14 NOTE — Patient Instructions (Signed)
Keep the area clean and dry. You can use the antibiotic ointment to help prevent infection.  You can use ice on the area for 20 minutes at a time twice a day.  If you start developing headaches, weakness, memory problems, nausea, or vomiting, please let me know immediately.      Head Injury, Adult There are many types of head injuries. They can be as minor as a bump. Some head injuries can be worse. Worse injuries include:  A strong hit to the head that shakes the brain back and forth causing damage (concussion).  A bruise (contusion) of the brain. This means there is bleeding in the brain that can cause swelling.  A cracked skull (skull fracture).  Bleeding in the brain that gathers, gets thick (makes a clot), and forms a bump (hematoma). Most problems from a head injury come in the first 24 hours. However, you may still have side effects up to 7-10 days after your injury. It is important to watch your condition for any changes. You may need to be watched in the emergency department or urgent care, or you may need to stay in the hospital. What are the causes? There are many possible causes of a head injury. A serious head injury may be caused by:  A car accident.  Bicycle or motorcycle accidents.  Sports injuries.  Falls. What are the signs or symptoms? Symptoms of a head injury include a bruise, bump, or bleeding where the injury happened. Other physical symptoms may include:  Headache.  Feeling sick to your stomach (nauseous) or vomiting.  Dizziness.  Feeling tired.  Being uncomfortable around bright lights or loud noises.  Shaking movements that you cannot control (seizures).  Trouble being woken up.  Passing out (fainting). Mental or emotional symptoms may include:  Feeling grumpy or cranky.  Confusion and memory problems.  Having trouble paying attention or concentrating.  Changes in eating or sleeping habits.  Feeling worried or nervous  (anxious).  Feeling sad (depressed). How is this treated? Treatment for this condition depends on how severe the injury is and the type of injury you have. The main goal is to prevent complications and to allow the brain time to heal. Mild head injury If you have a mild head injury, you may be sent home and treatment may include:  Being watched. A responsible adult should stay with you for 24 hours after your injury and check on you often.  Physical rest.  Brain rest.  Pain medicines. Severe head injury If you have a severe head injury, treatment may include:  Being watched closely. This includes hospitalization with frequent physical exams.  Medicines to: ? Help with pain. ? Prevent shaking movements that you cannot control. ? Help with brain swelling.  Using a machine that helps you breathe (ventilator).  Treatments to manage the swelling inside the brain.  Brain surgery. This may be needed to: ? Remove a blood clot. ? Stop the bleeding. ? Remove a part of the skull. This allows room for the brain to swell. Follow these instructions at home: Activity  Rest.  Avoid activities that are hard or tiring.  Make sure you get enough sleep.  Limit activities that need a lot of thought or attention, such as: ? Watching TV. ? Playing memory games and puzzles. ? Job-related work or homework. ? Working on [employer], and texting.  Avoid activities that could cause another head injury until your doctor says it is okay. This includes  playing sports. Having another head injury, especially before the first one has healed, can be dangerous.  Ask your doctor when it is safe for you to go back to your normal activities, such as work or school. Ask your doctor for a step-by-step plan for slowly going back to your normal activities.  Ask your doctor when you can drive, ride a bicycle, or use heavy machinery. Do not do these activities if you are  dizzy. Lifestyle   Do not drink alcohol until your doctor says it is okay.  Do not use drugs.  If it is harder than usual to remember things, write them down.  If you are easily distracted, try to do one thing at a time.  Talk with family members or close friends when making important decisions.  Tell your friends, family, a trusted coworker, and work Freight forwarder about your injury, symptoms, and limits (restrictions). Have them watch for any problems that are new or getting worse. General instructions  Take over-the-counter and prescription medicines only as told by your doctor.  Have someone stay with you for 24 hours after your head injury. This person should watch you for any changes in your symptoms and be ready to get help.  Keep all follow-up visits as told by your doctor. This is important. How is this prevented?  Work on Astron[employer]. This can help you avoid falls.  Wear a seatbelt when you are in a moving vehicle.  Wear a helmet when you: ? Ride a bicycle. ? Ski. ? Do any other sport or activity that has a risk of injury.  If you drink alcohol: ? Limit how much you use to:  0-1 drink a day for women.  0-2 drinks a day for men. ? Be aware of how much alcohol is in your drink. In the [place] one drink equals one 12 oz bottle of beer (355 mL), one 5 oz glass of wine (148 mL), or one 1 oz glass of hard liquor (44 mL).  Make your home safer by: ? Getting rid of clutter from the floors and stairs. This includes things that can make you trip. ? Using grab bars in bathrooms and handrails by stairs. ? Placing non-slip mats on floors and in bathtubs. ? Putting more light in dim areas. Get help right away if:  You have: ? A very bad headache that is not helped by medicine. ? Trouble walking or weakness in your arms and legs. ? Clear or bloody fluid coming from your nose or ears. ? Changes in how you see (vision). ? Shaking movements that you cannot  control.  You lose your balance.  You vomit.  The black centers of your eyes (pupils) change in size.  Your speech is slurred.  Your dizziness gets worse.  You pass out.  You are sleepier than normal and have trouble staying awake.  Your symptoms get worse. These symptoms may be an emergency. Do not wait to see if the symptoms will go away. Get medical help right away. Call your local emergency services (911 in the [place] Do not drive yourself to the hospital. Summary  There are many types of head injuries. They can be as minor as a bump. Some head injuries can be worse  Treatment for this condition depends on how severe the injury is and the type of injury you have.  Ask your doctor when it is safe for you to go back to your normal activities, such as work or  school.  To prevent a head injury, wear a seat belt in a car, wear a helmet when you use a a bicycle, limit your alcohol use, and make your home safer. This information is not intended to replace advice given to you by your health care provider. Make sure you discuss any questions you have with your health care provider. Document Revised: 06/01/2018 Document Reviewed: 03/03/2018 Elsevier Patient Education  [place].

## 2020-01-14 NOTE — Progress Notes (Signed)
Acute Office Visit  Subjective:    Patient ID: Erika Strong, female    DOB: 18-Dec-1945, 74 y.o.   MRN: [id number]  Chief Complaint  Patient presents with  . Fall    HPI Erika Strong is a pleasant 74 year old female presenting today for a fall with injury to her forehead that occurred on Friday of last week. She reports that she was running in the yard with her dog and tripped over a stone statue and fell face first onto the concrete. She reports that she did not lose consciousness after the fall.   She has not had any dizziness, headaches, increased pressure in her head, memory problems, nausea, vomiting, fatigue, weakness, or changes in her vision.   She endorses a bruising of both of her eyes and a large scrape on the left frontal area of her head.   Past Medical History:  Diagnosis Date  . Anemia due to chemotherapy 01/23/2014  . Cancer ([place])   . Cause of injury, fall 01/14/2020  . Depression 10/22/2016  . Diverticulitis 2010  . Endometrial cancer ([place]) 11/02/2010  . Hepatic steatosis 02/12/2014  . Hyperlipidemia    2007  . Hypertension   . Malignant neoplasm of corpus uteri, except isthmus ([place]) 01/21/2011  . Wears glasses     Past Surgical History:  Procedure Laterality Date  . ABDOMINAL HYSTERECTOMY  11/24/2010   RTLH, BSO, RPLND, LPLNS  . GONADECTOMY AND HYSTERECTOMY  11/24/2010   Endometrial cancer, performed by Dr. Janie Morning  . MOLE REMOVAL  Nov 2012    Family History  Problem Relation Age of Onset  . Osteoporosis Mother   . Rheum arthritis Mother   . Cancer Mother   . Lung cancer Father        smoker  . Cancer Father     Social History   Socioeconomic History  . Marital status: Married    Spouse name: [name]  . Number of children: 1   . Years of education: Assoc degr  . Highest education level: Associate degree: academic program  Occupational History  . Occupation: Engineer, maintenance    Comment: retired    Fish farm manager: [employer]  Tobacco Use  . Smoking status: Never Smoker  . Smokeless tobacco: Never Used  Vaping Use  . Vaping Use: Never used  Substance and Sexual Activity  . Alcohol use: No  . Drug use: No  . Sexual activity: Not Currently    Comment: cosmetologist, teaches, associate degree, married, regularly exercises.   Other Topics Concern  . Not on file  Social History Narrative   Walks daily for exercise. Still works part time doing hair twice a week. No caffeine use   Social Determinants of Radio broadcast assistant Strain:   . Difficulty of Paying Living Expenses: Not on file  Food Insecurity:   . Worried About Charity fundraiser in the Last Year: Not on file  . Ran Out of Food in the Last Year: Not on file  Transportation Needs:   . Lack of Transportation (Medical): Not on file  . Lack of Transportation (Non-Medical): Not on file  Physical Activity:   . Days of Exercise per Week: Not on file  . Minutes of Exercise per Session: Not on file  Stress:   . Feeling of Stress : Not on file  Social Connections:   . Frequency of Communication with Friends and Family: Not on file  . Frequency of Social Gatherings with Friends and  Family: Not on file  . Attends Religious Services: Not on file  . Active Member of Clubs or Organizations: Not on file  . Attends Archivist Meetings: Not on file  . Marital Status: Not on file  Intimate Partner Violence:   . Fear of Current or Ex-Partner: Not on file  . Emotionally Abused: Not on file  . Physically Abused: Not on file  . Sexually Abused: Not on file    Outpatient Medications Prior to Visit  Medication Sig Dispense Refill  . AMBULATORY NON FORMULARY MEDICATION Medication Name: daily vibrover powder probiotic    . Ascorbic Acid (VITAMIN C CR) 1000 MG TBCR Take 1,000 mg by mouth daily.    . B Complex-C (B-COMPLEX WITH VITAMIN C) tablet Take 1 tablet by mouth daily.    . chlorthalidone (HYGROTON) 50 MG tablet Take 0.5 tablets (25  mg total) by mouth daily. 90 tablet 1  . CRANBERRY EXTRACT PO Take by mouth.    . megestrol (MEGACE) 40 MG tablet Take 80 mg by mouth 2 (two) times daily. Take 80 mg orally twice daily x 3 weeks then alternate with tamoxifen    . OVER THE COUNTER MEDICATION Take 1 tablet by mouth daily. Curamin    . OVER THE COUNTER MEDICATION Take 3 tablets by mouth daily. New Chapter Bone Strength (calcium product)    . OVER THE COUNTER MEDICATION Take 1 packet by mouth 2 (two) times daily. Preston Fleeting Vitamin Pak (contains fish oil, potassium and multi vitamins)    . OVER THE COUNTER MEDICATION Take 3 tablets by mouth 3 (three) times daily. Intestinal Sooth and Build    . OVER THE COUNTER MEDICATION Take 2 tablets by mouth 2 (two) times daily. Stress J    . OVER THE COUNTER MEDICATION Take 1 tablet by mouth daily. Liver Care    . OVER THE COUNTER MEDICATION Take 10 mLs by mouth daily with breakfast. Liquid chlorophyl    . OVER THE COUNTER MEDICATION Take 30 mLs by mouth daily with breakfast. Flax Seed Oil    . OVER THE COUNTER MEDICATION Take 2 tablets by mouth 2 (two) times daily. Stress Gaurd    . potassium chloride (MICRO-K) 10 MEQ CR capsule Take 1 capsule (10 mEq total) by mouth every other day. 45 capsule 1  . PRADAXA 150 MG CAPS capsule TAKE ONE CAPSULE BY MOUTH TWICE DAILY 180 capsule 1  . RESTASIS 0.05 % ophthalmic emulsion 1 drop 2 (two) times daily.    . SUPER B COMPLEX/C PO Take 1 tablet by mouth daily.    . tamoxifen (NOLVADEX) 20 MG tablet Take 1 tablet (20 mg total) by mouth 2 (two) times daily. Take 1 tablet by mouth twice daily for 3 weeks, then alternate with Megace 180 tablet 1  . Ubiquinol 100 MG CAPS Take 100 mg by mouth daily with breakfast.     . UNABLE TO FIND Take by mouth.     No facility-administered medications prior to visit.    Allergies  Allergen Reactions  . Granix [Filgrastim]     Shot on [employer]. Developed chills and fever  . Atorvastatin Other (See Comments)      myalgias  . Fish Allergy Nausea And Vomiting  . Lisinopril Other (See Comments)     Cough  . Pravastatin Sodium Other (See Comments)     myalgias  . Simvastatin Other (See Comments)     myalgias  . Xarelto [Rivaroxaban] Other (See Comments)    Tachycardia  .  Eliquis [Apixaban] Rash  . Keflex [Cephalexin] Rash    Review of Systems All review of systems negative except what is listed in the HPI     Objective:    Physical Exam Vitals and nursing note reviewed.  HENT:     Head: Abrasion, contusion, right periorbital erythema, left periorbital erythema and laceration present.     Jaw: No tenderness, swelling or pain on movement.      Comments: No tenderness on palpation of the nose, eyes, cheeks, or jaw line.     Right Ear: Tympanic membrane, ear canal and external ear normal.     Left Ear: Tympanic membrane, ear canal and external ear normal.     Nose: Nose normal.     Mouth/Throat:     Mouth: Mucous membranes are moist.     Pharynx: Oropharynx is clear. No oropharyngeal exudate or posterior oropharyngeal erythema.  Eyes:     General: Vision grossly intact. Gaze aligned appropriately. No visual field deficit or scleral icterus.    Extraocular Movements: Extraocular movements intact.     Right eye: Normal extraocular motion and no nystagmus.     Left eye: Normal extraocular motion and no nystagmus.     Conjunctiva/sclera: Conjunctivae normal.     Pupils: Pupils are equal, round, and reactive to light.  Cardiovascular:     Rate and Rhythm: Normal rate and regular rhythm.     Pulses: Normal pulses.     Heart sounds: Normal heart sounds.  Pulmonary:     Effort: Pulmonary effort is normal.     Breath sounds: Normal breath sounds.  Abdominal:     General: Bowel sounds are normal.     Palpations: Abdomen is soft.  Musculoskeletal:        General: No swelling or tenderness. Normal range of motion.     Cervical back: Normal range of motion. No tenderness.     Right lower leg:  No edema.     Left lower leg: No edema.  Skin:    General: Skin is warm and dry.     Capillary Refill: Capillary refill takes less than 2 seconds.     Findings: Bruising present.  Neurological:     General: No focal deficit present.     Mental Status: She is alert and oriented to person, place, and time.     Cranial Nerves: No cranial nerve deficit.     Sensory: No sensory deficit.     Motor: No weakness.     Coordination: Coordination normal.     Gait: Gait normal.     Deep Tendon Reflexes: Reflexes normal.  Psychiatric:        Mood and Affect: Mood normal.        Behavior: Behavior normal.        Thought Content: Thought content normal.        Judgment: Judgment normal.     BP (!) 142/76   Pulse 93   Temp 98.1 F (36.7 C) (Oral)   Ht _0  (1.676 m)   Wt 231 lb 1.6 oz (104.8 kg)   SpO2 97%   BMI 37.30 kg/m  Wt Readings from Last 3 Encounters:  01/14/20 231 lb 1.6 oz (104.8 kg)  08/29/19 212 lb (96.2 kg)  04/16/19 212 lb (96.2 kg)    There are no preventive care reminders to display for this patient.  There are no preventive care reminders to display for this patient.   Lab Results  Component Value Date  TSH 2.18 02/28/2019   Lab Results  Component Value Date   WBC 8.3 08/29/2019   HGB 13.5 08/29/2019   HCT 40.0 08/29/2019   MCV 90.1 08/29/2019   PLT 232 08/29/2019   Lab Results  Component Value Date   NA 137 08/29/2019   K 4.1 08/29/2019   CHLORIDE 106 07/15/2014   CO2 28 08/29/2019   GLUCOSE 84 08/29/2019   BUN 13 08/29/2019   CREATININE 0.86 08/29/2019   BILITOT 0.5 08/29/2019   ALKPHOS 44 06/21/2016   AST 21 08/29/2019   ALT 20 08/29/2019   PROT 6.3 08/29/2019   ALBUMIN 3.8 06/21/2016   CALCIUM 9.3 08/29/2019   ANIONGAP 11 07/15/2014   EGFR 81 (L) 07/15/2014   Lab Results  Component Value Date   CHOL 196 02/28/2019   Lab Results  Component Value Date   HDL 45 (L) 02/28/2019   Lab Results  Component Value Date   LDLCALC 127 (H)  02/28/2019   Lab Results  Component Value Date   TRIG 129 02/28/2019   Lab Results  Component Value Date   CHOLHDL 4.4 02/28/2019   Lab Results  Component Value Date   HGBA1C 5.2 06/21/2016       Assessment & Plan:   Problem List Items Addressed This Visit      Other   Cause of injury, fall - Primary    Fall with head injury 11/19- first encounter for care today.  Abrasion with suspected infection on the left frontal region of the scalp. Area cleansed and antibiotic ointment and sterile bandage placed.  Will start bactrim for suspected infection.  Edema and erythema to the frontal region of the scalp and both eyes. She is on pradaxa. No signs of bleeding at this time. No deficits noted on examination.  Will obtain xray of facial bones today due to bruising and swelling. Given the length of time since the fall and the lack of symptoms, a decision was made to avoid CT at this time.  Fluconazole provided for yeast infection with antibiotic use.  Will plan to follow-up in 1 week to see how she is doing or sooner if symptoms worsen or new symptoms develop.  Images placed into Epic today.       Relevant Medications   sulfamethoxazole-trimethoprim (BACTRIM DS) 800-160 MG tablet   fluconazole (DIFLUCAN) 150 MG tablet   Other Relevant Orders   DG Facial Bones 1-2 Views       Meds ordered this encounter  Medications  . sulfamethoxazole-trimethoprim (BACTRIM DS) 800-160 MG tablet    Sig: Take 1 tablet by mouth 2 (two) times daily.    Dispense:  10 tablet    Refill:  0  . fluconazole (DIFLUCAN) 150 MG tablet    Sig: Take 1 tablet (150 mg total) by mouth once for 1 dose.    Dispense:  1 tablet    Refill:  0   Return in about 1 week (around 01/21/2020) for fall.   Orma Render, NP

## 2020-01-14 NOTE — Assessment & Plan Note (Addendum)
Fall with head injury 11/19- first encounter for care today.  Abrasion with suspected infection on the left frontal region of the scalp. Area cleansed and antibiotic ointment and sterile bandage placed.  Will start bactrim for suspected infection.  Edema and erythema to the frontal region of the scalp and both eyes. She is on pradaxa. No signs of bleeding at this time. No deficits noted on examination.  Will obtain xray of facial bones today due to bruising and swelling. Given the length of time since the fall and the lack of symptoms, a decision was made to avoid CT at this time.  Fluconazole provided for yeast infection with antibiotic use.  Will plan to follow-up in 1 week to see how she is doing or sooner if symptoms worsen or new symptoms develop.  Images placed into Epic today.

## 2020-01-22 ENCOUNTER — Other Ambulatory Visit: Payer: Self-pay | Admitting: Family Medicine

## 2020-02-25 ENCOUNTER — Encounter: Payer: Self-pay | Admitting: Family Medicine

## 2020-02-25 ENCOUNTER — Ambulatory Visit (INDEPENDENT_AMBULATORY_CARE_PROVIDER_SITE_OTHER): Payer: Medicare PPO | Admitting: Family Medicine

## 2020-02-25 ENCOUNTER — Other Ambulatory Visit: Payer: Self-pay

## 2020-02-25 VITALS — BP 132/72 | HR 80 | Ht 66.0 in | Wt 211.0 lb

## 2020-02-25 DIAGNOSIS — C541 Malignant neoplasm of endometrium: Secondary | ICD-10-CM

## 2020-02-25 DIAGNOSIS — M170 Bilateral primary osteoarthritis of knee: Secondary | ICD-10-CM

## 2020-02-25 DIAGNOSIS — Z5181 Encounter for therapeutic drug level monitoring: Secondary | ICD-10-CM | POA: Diagnosis not present

## 2020-02-25 DIAGNOSIS — Z7901 Long term (current) use of anticoagulants: Secondary | ICD-10-CM

## 2020-02-25 DIAGNOSIS — I10 Essential (primary) hypertension: Secondary | ICD-10-CM

## 2020-02-25 NOTE — Progress Notes (Signed)
Established Patient Office Visit  Subjective:  Patient ID: Erika Strong, female    DOB: 1945-05-24  Age: 75 y.o. MRN: [id number]  CC:  Chief Complaint  Patient presents with  . Hypertension    HPI Erika Strong presents for   Hypertension- Pt denies chest pain, SOB, dizziness, or heart palpitations.  Taking meds as directed w/o problems.  Denies medication side effects.    She also had some questions about her Pradaxa.  She follows with hematology and they have recommended indefinite treatment with anticoagulation because of her active cancer history and multiple emboli.  She says so far she is actually been tolerating the Pradaxa well.she is worried she will have to stop it as she ages.    She did fall this fall. She was chasing her dog and tripped over a cement chicken and hit her head.  She saw [name] in November for this injury she still has a scar on the left side of her forehead but says she is doing well.  Wants to know if should get a booster for her Covid vaccine.  Her original one was [employer] and wanted to know if I thought she should get a second [employer] or switch to one of the mRNA vaccines.  Reports some problem and pain in her knees.  She says that they are getting stiff and feeling weak at times.  No clicking locking or giving out though.  Past Medical History:  Diagnosis Date  . Anemia due to chemotherapy 01/23/2014  . Cancer ([place])   . Cause of injury, fall 01/14/2020  . Depression 10/22/2016  . Diverticulitis 2010  . Endometrial cancer ([place]) 11/02/2010  . Hepatic steatosis 02/12/2014  . Hyperlipidemia    2007  . Hypertension   . Malignant neoplasm of corpus uteri, except isthmus ([place]) 01/21/2011  . Wears glasses     Past Surgical History:  Procedure Laterality Date  . ABDOMINAL HYSTERECTOMY  11/24/2010   RTLH, BSO, RPLND, LPLNS  . GONADECTOMY AND HYSTERECTOMY  11/24/2010   Endometrial cancer, performed by Dr. Janie Morning  .  MOLE REMOVAL  Nov 2012    Family History  Problem Relation Age of Onset  . Osteoporosis Mother   . Rheum arthritis Mother   . Cancer Mother   . Lung cancer Father        smoker  . Cancer Father     Social History   Socioeconomic History  . Marital status: Married    Spouse name: [name]  . Number of children: 1   . Years of education: Assoc degr  . Highest education level: Associate degree: academic program  Occupational History  . 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  Tobacco Use  . Smoking status: Never Smoker  . Smokeless tobacco: Never Used  Vaping Use  . Vaping Use: Never used  Substance and Sexual Activity  . Alcohol use: No  . Drug use: No  . Sexual activity: Not Currently    Comment: cosmetologist, teaches, associate degree, married, regularly exercises.   Other Topics Concern  . Not on file  Social History Narrative   Walks daily for exercise. Still works part time doing hair twice a week. No caffeine use   Social Determinants of Radio broadcast assistant Strain: Not on file  Food Insecurity: Not on file  Transportation Needs: Not on file  Physical Activity: Not on file  Stress: Not  on file  Social Connections: Not on file  Intimate Partner Violence: Not on file    Outpatient Medications Prior to Visit  Medication Sig Dispense Refill  . AMBULATORY NON FORMULARY MEDICATION Medication Name: daily vibrover powder probiotic    . Ascorbic Acid (VITAMIN C CR) 1000 MG TBCR Take 1,000 mg by mouth daily.    . B Complex-C (B-COMPLEX WITH VITAMIN C) tablet Take 1 tablet by mouth daily.    . chlorthalidone (HYGROTON) 50 MG tablet TAKE 1/2 TABLET BY MOUTH DAILY 45 tablet 0  . CRANBERRY EXTRACT PO Take by mouth.    . megestrol (MEGACE) 40 MG tablet Take 80 mg by mouth 2 (two) times daily. Take 80 mg orally twice daily x 3 weeks then alternate with tamoxifen    . OVER THE COUNTER MEDICATION Take 2 tablets by  mouth in the morning and at bedtime. Curamin    . OVER THE COUNTER MEDICATION Take 3 tablets by mouth daily. New Chapter Bone Strength (calcium product)    . OVER THE COUNTER MEDICATION Take 1 packet by mouth 2 (two) times daily. Preston Fleeting Vitamin Pak (contains fish oil, potassium and multi vitamins)    . OVER THE COUNTER MEDICATION Take 1 tablet by mouth daily. Daily vibrance    . OVER THE COUNTER MEDICATION Take 2 tablets by mouth 2 (two) times daily. Stress J    . OVER THE COUNTER MEDICATION Take 1 tablet by mouth daily. Liver Care    . OVER THE COUNTER MEDICATION Take 10 mLs by mouth daily with breakfast. Liquid chlorophyl    . OVER THE COUNTER MEDICATION Take 30 mLs by mouth daily with breakfast. Flax Seed Oil    . OVER THE COUNTER MEDICATION Take 2 tablets by mouth 2 (two) times daily. Stress Gaurd    . potassium chloride (MICRO-K) 10 MEQ CR capsule Take 1 capsule (10 mEq total) by mouth every other day. 45 capsule 1  . PRADAXA 150 MG CAPS capsule TAKE ONE CAPSULE BY MOUTH TWICE DAILY 180 capsule 1  . RESTASIS 0.05 % ophthalmic emulsion 1 drop 2 (two) times daily.    Marland Kitchen sulfamethoxazole-trimethoprim (BACTRIM DS) 800-160 MG tablet Take 1 tablet by mouth 2 (two) times daily. 10 tablet 0  . SUPER B COMPLEX/C PO Take 1 tablet by mouth daily.    . tamoxifen (NOLVADEX) 20 MG tablet Take 1 tablet (20 mg total) by mouth 2 (two) times daily. Take 1 tablet by mouth twice daily for 3 weeks, then alternate with Megace 180 tablet 1  . Ubiquinol 100 MG CAPS Take 100 mg by mouth daily with breakfast.     . UNABLE TO FIND Take by mouth.     No facility-administered medications prior to visit.    Allergies  Allergen Reactions  . Granix [Filgrastim]     Shot on [employer]. Developed chills and fever  . Atorvastatin Other (See Comments)     myalgias  . Fish Allergy Nausea And Vomiting  . Lisinopril Other (See Comments)     Cough  . Pravastatin Sodium Other (See Comments)     myalgias  . Simvastatin  Other (See Comments)     myalgias  . Xarelto [Rivaroxaban] Other (See Comments)    Tachycardia  . Eliquis [Apixaban] Rash  . Keflex [Cephalexin] Rash    ROS Review of Systems    Objective:    Physical Exam Constitutional:      Appearance: She is well-developed and well-nourished.  HENT:  Head: Normocephalic and atraumatic.  Cardiovascular:     Rate and Rhythm: Normal rate and regular rhythm.     Heart sounds: Normal heart sounds.  Pulmonary:     Effort: Pulmonary effort is normal.     Breath sounds: Normal breath sounds.  Skin:    General: Skin is warm and dry.  Neurological:     Mental Status: She is alert and oriented to person, place, and time.  Psychiatric:        Mood and Affect: Mood and affect normal.        Behavior: Behavior normal.     BP 132/72   Pulse 80   Ht 5' 6"  (1.676 m)   Wt 211 lb (95.7 kg)   SpO2 99%   BMI 34.06 kg/m  Wt Readings from Last 3 Encounters:  02/25/20 211 lb (95.7 kg)  01/14/20 231 lb 1.6 oz (104.8 kg)  08/29/19 212 lb (96.2 kg)     Health Maintenance Due  Topic Date Due  . COVID-19 Vaccine (2 - Booster for Janssen series) 07/30/2019    There are no preventive care reminders to display for this patient.  Lab Results  Component Value Date   TSH 2.18 02/28/2019   Lab Results  Component Value Date   WBC 11.0 (H) 02/25/2020   HGB 14.2 02/25/2020   HCT 42.0 02/25/2020   MCV 89.9 02/25/2020   PLT 279 02/25/2020   Lab Results  Component Value Date   NA 138 02/25/2020   K 4.7 02/25/2020   CHLORIDE 106 07/15/2014   CO2 27 02/25/2020   GLUCOSE 80 02/25/2020   BUN 13 02/25/2020   CREATININE 0.92 02/25/2020   BILITOT 0.6 02/25/2020   ALKPHOS 44 06/21/2016   AST 30 02/25/2020   ALT 26 02/25/2020   PROT 7.0 02/25/2020   ALBUMIN 3.8 06/21/2016   CALCIUM 10.0 02/25/2020   ANIONGAP 11 07/15/2014   EGFR 81 (L) 07/15/2014   Lab Results  Component Value Date   CHOL 199 02/25/2020   Lab Results  Component Value  Date   HDL 45 (L) 02/25/2020   Lab Results  Component Value Date   LDLCALC 128 (H) 02/25/2020   Lab Results  Component Value Date   TRIG 147 02/25/2020   Lab Results  Component Value Date   CHOLHDL 4.4 02/25/2020   Lab Results  Component Value Date   HGBA1C 5.2 06/21/2016      Assessment & Plan:   Problem List Items Addressed This Visit      Cardiovascular and Mediastinum   HYPERTENSION, BENIGN - Primary    Well controlled. Continue current regimen. Follow up in 6 months. Due for labs.        Relevant Orders   COMPLETE METABOLIC PANEL WITH GFR (Completed)   CBC (Completed)   TSH   Lipid Panel w/reflex Direct LDL (Completed)     Genitourinary   Endometrial cancer ([place])    Followed at [facility] On Letrozole.   She will actually meet her new oncologist in about 2 weeks.  Her previous one recently left [facility].      Relevant Orders   COMPLETE METABOLIC PANEL WITH GFR (Completed)   CBC (Completed)   TSH   Lipid Panel w/reflex Direct LDL (Completed)     Other   Anticoagulation management encounter s/p bilat pul emboli    Initially followed at [facility] after having bilateral pulmonary emboli and a venous emboli left proximal femoral vein.  Recommendation to continue Pradaxa 150  mg every 12 hours indefinitely as long as she tolerates it. We discussed that we will always way the pros and cons and bleeding risk.         Other Visit Diagnoses    Primary osteoarthritis of both knees         Knee OA - work on stretches and strengthening of both legs and knees to support the joints.    No orders of the defined types were placed in this encounter.   Follow-up: Return in about 6 months (around 08/24/2020) for Hypertension.    Beatrice Lecher, MD

## 2020-02-25 NOTE — Assessment & Plan Note (Signed)
Initially followed at [facility] after having bilateral pulmonary emboli and a venous emboli left proximal femoral vein.  Recommendation to continue Pradaxa 150 mg every 12 hours indefinitely as long as she tolerates it. We discussed that we will always way the pros and cons and bleeding risk.

## 2020-02-25 NOTE — Assessment & Plan Note (Signed)
Well controlled. Continue current regimen. Follow up in  6 months. Due for labs.   

## 2020-02-25 NOTE — Assessment & Plan Note (Signed)
Followed at [facility]. On Letrozole.   She will actually meet her new oncologist in about 2 weeks.  Her previous one recently left [facility].

## 2020-02-26 ENCOUNTER — Encounter: Payer: Self-pay | Admitting: Family Medicine

## 2020-02-27 LAB — CBC
HCT: 42 % (ref 35.0–45.0)
Hemoglobin: 14.2 g/dL (ref 11.7–15.5)
MCH: 30.4 pg (ref 27.0–33.0)
MCHC: 33.8 g/dL (ref 32.0–36.0)
MCV: 89.9 fL (ref 80.0–100.0)
MPV: 11 fL (ref 7.5–12.5)
Platelets: 279 10*3/uL (ref 140–400)
RBC: 4.67 10*6/uL (ref 3.80–5.10)
RDW: 12.3 % (ref 11.0–15.0)
WBC: 11 10*3/uL — ABNORMAL HIGH (ref 3.8–10.8)

## 2020-02-27 LAB — COMPLETE METABOLIC PANEL WITH GFR
AG Ratio: 1.5 (calc) (ref 1.0–2.5)
ALT: 26 U/L (ref 6–29)
AST: 30 U/L (ref 10–35)
Albumin: 4.2 g/dL (ref 3.6–5.1)
Alkaline phosphatase (APISO): 32 U/L — ABNORMAL LOW (ref 37–153)
BUN: 13 mg/dL (ref 7–25)
CO2: 27 mmol/L (ref 20–32)
Calcium: 10 mg/dL (ref 8.6–10.4)
Chloride: 103 mmol/L (ref 98–110)
Creat: 0.92 mg/dL (ref 0.60–0.93)
GFR, Est African American: 72 mL/min/{1.73_m2} (ref >=60)
GFR, Est Non African American: 62 mL/min/{1.73_m2} (ref >=60)
Globulin: 2.8 g/dL (calc) (ref 1.9–3.7)
Glucose, Bld: 80 mg/dL (ref 65–99)
Potassium: 4.7 mmol/L (ref 3.5–5.3)
Sodium: 138 mmol/L (ref 135–146)
Total Bilirubin: 0.6 mg/dL (ref 0.2–1.2)
Total Protein: 7 g/dL (ref 6.1–8.1)

## 2020-02-27 LAB — LIPID PANEL W/REFLEX DIRECT LDL
Cholesterol: 199 mg/dL (ref <200)
HDL: 45 mg/dL — ABNORMAL LOW (ref >=50)
LDL Cholesterol (Calc): 128 mg/dL (calc) — ABNORMAL HIGH
Non-HDL Cholesterol (Calc): 154 mg/dL (calc) — ABNORMAL HIGH (ref <130)
Total CHOL/HDL Ratio: 4.4 (calc) (ref <5.0)
Triglycerides: 147 mg/dL (ref <150)

## 2020-02-27 LAB — TSH: TSH: 3.36 mIU/L (ref 0.40–4.50)

## 2020-03-07 DIAGNOSIS — C787 Secondary malignant neoplasm of liver and intrahepatic bile duct: Secondary | ICD-10-CM | POA: Diagnosis not present

## 2020-03-07 DIAGNOSIS — C7989 Secondary malignant neoplasm of other specified sites: Secondary | ICD-10-CM | POA: Diagnosis not present

## 2020-03-07 DIAGNOSIS — C7802 Secondary malignant neoplasm of left lung: Secondary | ICD-10-CM | POA: Diagnosis not present

## 2020-03-07 DIAGNOSIS — C7951 Secondary malignant neoplasm of bone: Secondary | ICD-10-CM | POA: Diagnosis not present

## 2020-03-07 DIAGNOSIS — C7801 Secondary malignant neoplasm of right lung: Secondary | ICD-10-CM | POA: Diagnosis not present

## 2020-03-07 DIAGNOSIS — C786 Secondary malignant neoplasm of retroperitoneum and peritoneum: Secondary | ICD-10-CM | POA: Diagnosis not present

## 2020-03-07 DIAGNOSIS — C541 Malignant neoplasm of endometrium: Secondary | ICD-10-CM | POA: Diagnosis not present

## 2020-03-07 DIAGNOSIS — Z5181 Encounter for therapeutic drug level monitoring: Secondary | ICD-10-CM | POA: Diagnosis not present

## 2020-03-07 DIAGNOSIS — Z23 Encounter for immunization: Secondary | ICD-10-CM | POA: Diagnosis not present

## 2020-03-07 DIAGNOSIS — Z79899 Other long term (current) drug therapy: Secondary | ICD-10-CM | POA: Diagnosis not present

## 2020-03-17 ENCOUNTER — Other Ambulatory Visit: Payer: Self-pay | Admitting: Family Medicine

## 2020-03-17 DIAGNOSIS — E876 Hypokalemia: Secondary | ICD-10-CM

## 2020-04-15 ENCOUNTER — Other Ambulatory Visit: Payer: Self-pay | Admitting: Family Medicine

## 2020-04-16 ENCOUNTER — Ambulatory Visit (INDEPENDENT_AMBULATORY_CARE_PROVIDER_SITE_OTHER): Payer: Medicare PPO | Admitting: Family Medicine

## 2020-04-16 DIAGNOSIS — Z0000 Encounter for general adult medical examination without abnormal findings: Secondary | ICD-10-CM | POA: Diagnosis not present

## 2020-04-16 NOTE — Progress Notes (Signed)
**Note Erika-Identified via Obfuscation** MEDICARE ANNUAL WELLNESS VISIT  04/16/2020  Telephone Visit Disclaimer This Medicare AWV was conducted by telephone due to national recommendations for restrictions regarding the COVID-19 Pandemic (e.g. social distancing).  I verified, using two identifiers, that I am speaking with Erika Strong or their authorized healthcare agent. I discussed the limitations, risks, security, and privacy concerns of performing an evaluation and management service by telephone and the potential availability of an in-person appointment in the future. The patient expressed understanding and agreed to proceed.  Location of Patient: Home Location of Provider (nurse):  In the office.  Subjective:    Erika Strong is a 75 y.o. female patient of Metheney, Erika Kocher, MD who had a Medicare Annual Wellness Visit today via telephone. Erika Strong is Working part time and lives with their spouse. she has 1 child. she reports that she is socially active and does interact with friends/family regularly. she is moderately physically active and enjoys cooking for her family and doing hair.  Patient Care Team: Erika Marry, MD as PCP - Erika Burrs, MD (Inactive) as Attending Physician (Obstetrics and Gynecology) Erika Drown, MD as Referring Physician (Obstetrics and Gynecology)  Advanced Directives 04/16/2020 04/16/2019 04/12/2018 08/09/2014 07/15/2014 06/10/2014 06/06/2014  Does Patient Have a Medical Advance Directive? No No No No No No No  Type of Advance Directive - - - - - - -  Copy of Healthcare Power of Attorney in Chart? - - - - - - -  Would patient like information on creating a medical advance directive? No - Patient declined No - Patient declined No - Patient declined No - patient declined information No - patient declined information No - patient declined information -    Hospital Utilization Over the Past 12 Months: # of hospitalizations or ER visits: 0 # of surgeries: 0  Review of  Systems    Patient reports that her overall health is unchanged compared to last year.  History obtained from chart review and the patient  Patient Reported Readings (BP, Pulse, CBG, Weight, etc) none  Pain Assessment Pain : No/denies pain     Current Medications & Allergies (verified) Allergies as of 04/16/2020      Reactions   Granix [filgrastim]    Shot on Tues. Developed chills and fever   Atorvastatin Other (See Comments)    myalgias   Fish Allergy Nausea And Vomiting   Lisinopril Other (See Comments)    Cough   Pravastatin Sodium Other (See Comments)    myalgias   Simvastatin Other (See Comments)    myalgias   Xarelto [rivaroxaban] Other (See Comments)   Tachycardia   Eliquis [apixaban] Rash   Keflex [cephalexin] Rash      Medication List       Accurate as of April 16, 2020  9:39 AM. If you have any questions, ask your nurse or doctor.        AMBULATORY NON FORMULARY MEDICATION Medication Name: daily vibrover powder probiotic   B-complex with vitamin C tablet Take 1 tablet by mouth daily.   SUPER B COMPLEX/C PO Take 1 tablet by mouth daily.   chlorthalidone 50 MG tablet Commonly known as: HYGROTON TAKE 1/2 TABLET BY MOUTH DAILY   CRANBERRY EXTRACT PO Take by mouth.   megestrol 40 MG tablet Commonly known as: MEGACE Take 80 mg by mouth 2 (two) times daily. Take 80 mg orally twice daily x 3 weeks then alternate with tamoxifen   OVER THE COUNTER MEDICATION Take  2 tablets by mouth in the Strong and at bedtime. Curamin   OVER THE COUNTER MEDICATION Take 3 tablets by mouth daily. [place] Bone Strength (calcium product)   OVER THE COUNTER MEDICATION Take 1 packet by mouth 2 (two) times daily. [place] Fleeting Vitamin Pak (contains fish oil, potassium and multi vitamins)   OVER THE COUNTER MEDICATION Take 1 tablet by mouth daily. Daily vibrance   OVER THE COUNTER MEDICATION Take 2 tablets by mouth 2 (two) times daily. Stress J   OVER THE  COUNTER MEDICATION Take 1 tablet by mouth daily. Liver Care   OVER THE COUNTER MEDICATION Take 10 mLs by mouth daily with breakfast. Liquid chlorophyl   OVER THE COUNTER MEDICATION Take 30 mLs by mouth daily with breakfast. Flax Seed Oil   OVER THE COUNTER MEDICATION Take 2 tablets by mouth 2 (two) times daily. Stress Gaurd   potassium chloride 10 MEQ CR capsule Commonly known as: MICRO-K Take 1 capsule (10 mEq total) by mouth every other day.   Pradaxa 150 MG Caps capsule Generic drug: dabigatran TAKE ONE CAPSULE BY MOUTH TWICE DAILY   Restasis 0.05 % ophthalmic emulsion Generic drug: cycloSPORINE 1 drop 2 (two) times daily.   sulfamethoxazole-trimethoprim 800-160 MG tablet Commonly known as: Bactrim DS Take 1 tablet by mouth 2 (two) times daily.   tamoxifen 20 MG tablet Commonly known as: NOLVADEX Take 1 tablet (20 mg total) by mouth 2 (two) times daily. Take 1 tablet by mouth twice daily for 3 weeks, then alternate with Megace   Ubiquinol 100 MG Caps Take 100 mg by mouth daily with breakfast.   UNABLE TO FIND Take by mouth.   Vitamin C CR 1000 MG Tbcr Take 1,000 mg by mouth daily.       History (reviewed): Past Medical History:  Diagnosis Date  . Anemia due to chemotherapy 01/23/2014  . Cancer ([place])   . Cause of injury, fall 01/14/2020  . Depression 10/22/2016  . Diverticulitis 2010  . Endometrial cancer ([place]) 11/02/2010  . Hepatic steatosis 02/12/2014  . Hyperlipidemia    2007  . Hypertension   . Malignant neoplasm of corpus uteri, except isthmus ([place]) 01/21/2011  . Wears glasses    Past Surgical History:  Procedure Laterality Date  . ABDOMINAL HYSTERECTOMY  11/24/2010   RTLH, BSO, RPLND, LPLNS  . GONADECTOMY AND HYSTERECTOMY  11/24/2010   Endometrial cancer, performed by Dr. Janie Strong  . MOLE REMOVAL  Nov 2012   Family History  Problem Relation Age of Onset  . Osteoporosis Mother   . Rheum arthritis Mother   . Cancer Mother   . Lung cancer  Father        smoker  . Cancer Father    Social History   Socioeconomic History  . Marital status: Married    Spouse name: [name]  . Number of children: 1   . Years of education: Assoc degr  . Highest education level: Associate degree: academic program  Occupational History  . 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  Tobacco Use  . Smoking status: Never Smoker  . Smokeless tobacco: Never Used  Vaping Use  . Vaping Use: Never used  Substance and Sexual Activity  . Alcohol use: No  . Drug use: No  . Sexual activity: Not Currently  Other Topics Concern  . Not on file  Social History Narrative   Walks daily for exercise. Still works part time doing hair twice a  week. One cup of coffee every day. Enjoys doing hair on Thursday and Friday. Cooks for the family on Wednesday and Saturday.   Social Determinants of Health   Financial Resource Strain: Low Risk   . Difficulty of Paying Living Expenses: Not hard at all  Food Insecurity: No Food Insecurity  . Worried About Charity fundraiser in the Last Year: Never true  . Ran Out of Food in the Last Year: Never true  Transportation Needs: No Transportation Needs  . Lack of Transportation (Medical): No  . Lack of Transportation (Non-Medical): No  Physical Activity: Sufficiently Active  . Days of Exercise per Week: 7 days  . Minutes of Exercise per Session: 30 min  Stress: No Stress Concern Present  . Feeling of Stress : Not at all  Social Connections: Moderately Integrated  . Frequency of Communication with Friends and Family: More than three times a week  . Frequency of Social Gatherings with Friends and Family: Once a week  . Attends Religious Services: More than 4 times per year  . Active Member of Clubs or Organizations: No  . Attends Archivist Meetings: Never  . Marital Status: Married    Activities of Daily Living In your present state of health, do you have  any difficulty performing the following activities: 04/16/2020  Hearing? N  Vision? N  Difficulty concentrating or making decisions? N  Walking or climbing stairs? N  Dressing or bathing? N  Doing errands, shopping? N  Preparing Food and eating ? N  Using the Toilet? N  In the past six months, have you accidently leaked urine? N  Do you have problems with loss of bowel control? N  Managing your Medications? N  Managing your Finances? N  Housekeeping or managing your Housekeeping? N  Some recent data might be hidden    Patient Education/ Literacy How often do you need to have someone help you when you read instructions, pamphlets, or other written materials from your doctor or pharmacy?: 1 - Never What is the last grade level you completed in school?: 12th grade  Exercise Current Exercise Habits: Home exercise routine, Type of exercise: walking, Time (Minutes): 30, Frequency (Times/Week): 7, Weekly Exercise (Minutes/Week): 210, Intensity: Moderate, Exercise limited by: None identified  Diet Patient reports consuming 3 meals a day and 0 snack(s) a day Patient reports that her primary diet is: Regular Patient reports that she does have regular access to food.   Depression Screen PHQ 2/9 Scores 04/16/2020 04/16/2019 02/28/2019 08/28/2018 04/12/2018 02/27/2018 04/20/2017  PHQ - 2 Score 0 0 0 0 0 0 0  PHQ- 9 Score 0 - - - - - 2     Fall Risk Fall Risk  04/16/2020 04/16/2019 08/28/2018 04/12/2018 02/27/2018  Falls in the past year? 1 0 1 1 0  Number falls in past yr: 0 - 0 0 0  Injury with Fall? 0 - 0 0 -  Risk for fall due to : No Fall Risks No Fall Risks - - -  Follow up Falls evaluation completed;Education provided;Falls prevention discussed Falls prevention discussed - Falls prevention discussed -     Objective:  Erika Strong seemed alert and oriented and she participated appropriately during our telephone visit.  Blood Pressure Weight BMI  BP Readings from Last 3 Encounters:  02/25/20  132/72  01/14/20 (!) 142/76  08/29/19 136/62   Wt Readings from Last 3 Encounters:  02/25/20 211 lb (95.7 kg)  01/14/20 231 lb 1.6 oz (  104.8 kg)  08/29/19 212 lb (96.2 kg)   BMI Readings from Last 1 Encounters:  02/25/20 34.06 kg/m    *Unable to obtain current vital signs, weight, and BMI due to telephone visit type  Hearing/Vision  . Aryanne did not seem to have difficulty with hearing/understanding during the telephone conversation . Reports that she has had a formal eye exam by an eye care professional within the past year . Reports that she has not had a formal hearing evaluation within the past year *Unable to fully assess hearing and vision during telephone visit type  Cognitive Function: 6CIT Screen 04/16/2020 04/16/2019 04/12/2018  What Year? 0 points 0 points 0 points  What month? 0 points 0 points 0 points  What time? 0 points 0 points 0 points  Count back from 20 0 points 0 points 0 points  Months in reverse 0 points 0 points 0 points  Repeat phrase 0 points 0 points 2 points  Total Score 0 0 2   (Normal:0-7, Significant for Dysfunction: >8)  Normal Cognitive Function Screening: Yes   Immunization & Health Maintenance Record Immunization History  Administered Date(s) Administered  . Influenza Split 01/01/2011, 01/11/2012  . Influenza Whole 11/30/2007, 11/22/2008  . Influenza,inj,Quad PF,6+ Mos 12/07/2012, 10/31/2013, 11/19/2014, 12/22/2015, 12/21/2016, 11/15/2017, 10/10/2018, 11/12/2019  . Janssen (J&J) SARS-COV-2 Vaccination 06/04/2019  . PFIZER(Purple Top)SARS-COV-2 Vaccination 03/07/2020  . Pneumococcal Conjugate-13 08/09/2014  . Pneumococcal Polysaccharide-23 09/22/2012  . Td 04/13/2007  . Tdap 05/02/2017  . Zoster 03/19/2009    Health Maintenance  Topic Date Due  . MAMMOGRAM  08/27/2021  . COLONOSCOPY (Pts 45-69yrs Insurance coverage will need to be confirmed)  08/21/2024  . TETANUS/TDAP  05/03/2027  . INFLUENZA VACCINE  Completed  . DEXA SCAN   Completed  . COVID-19 Vaccine  Completed  . Hepatitis C Screening  Completed  . PNA vac Low Risk Adult  Completed       Assessment  This is a routine wellness examination for Erika Strong.  Health Maintenance: Due or Overdue There are no preventive care reminders to display for this patient.  Erika Strong does not need a referral for Community Assistance: Care Management:   no Social Work:    no Prescription Assistance:  no Nutrition/Diabetes Education:  no   Plan:  Personalized Goals Goals Addressed              This Visit's Progress   .  Patient Stated (pt-stated)        04/16/2020 AWV Goal: Fall Prevention  . Over the next year, patient will decrease their risk for falls by: o Using assistive devices, such as a cane or walker, as needed o Identifying fall risks within their home and correcting them by: - Removing throw rugs - Adding handrails to stairs or ramps - Removing clutter and keeping a clear pathway throughout the home - Increasing light, especially at night - Adding shower handles/bars - Raising toilet seat o Identifying potential personal risk factors for falls: - Medication side effects - Incontinence/urgency - Vestibular dysfunction - Hearing loss - Musculoskeletal disorders - Neurological disorders - Orthostatic hypotension        Personalized Health Maintenance & Screening Recommendations  Shingrix  Lung Cancer Screening Recommended: no (Low Dose CT Chest recommended if Age 89-80 years, 30 pack-year currently smoking OR have quit w/in past 15 years) Hepatitis C Screening recommended: no HIV Screening recommended: no  Advanced Directives: Written information was not prepared per patient's request.  Referrals & Orders  No orders of the defined types were placed in this encounter.   Follow-up Plan . Follow-up with Erika Marry, MD as planned . Patient will and provide the dates for her shingrix vaccine. .    I have  personally reviewed and noted the following in the patient's chart:   . Medical and social history . Use of alcohol, tobacco or illicit drugs  . Current medications and supplements . Functional ability and status . Nutritional status . Physical activity . Advanced directives . List of other physicians . Hospitalizations, surgeries, and ER visits in previous 12 months . Vitals . Screenings to include cognitive, depression, and falls . Referrals and appointments  In addition, I have reviewed and discussed with Erika Strong certain preventive protocols, quality metrics, and best practice recommendations. A written personalized care plan for preventive services as well as general preventive health recommendations is available and can be mailed to the patient at her request.      Tinnie Gens, RN  04/16/2020

## 2020-04-16 NOTE — Patient Instructions (Signed)
 Fall Prevention in the Home, Adult Falls can cause injuries and can happen to people of all ages. There are many things you can do to make your home safe and to help prevent falls. Ask for help when making these changes. What actions can I take to prevent falls? General Instructions  Use good lighting in all rooms. Replace any light bulbs that burn out.  Turn on the lights in dark areas. Use night-lights.  Keep items that you use often in easy-to-reach places. Lower the shelves around your home if needed.  Set up your furniture so you have a clear path. Avoid moving your furniture around.  Do not have throw rugs or other things on the floor that can make you trip.  Avoid walking on wet floors.  If any of your floors are uneven, fix them.  Add color or contrast paint or tape to clearly mark and help you see: ? Grab bars or handrails. ? First and last steps of staircases. ? Where the edge of each step is.  If you use a stepladder: ? Make sure that it is fully opened. Do not climb a closed stepladder. ? Make sure the sides of the stepladder are locked in place. ? Ask someone to hold the stepladder while you use it.  Know where your pets are when moving through your home. What can I do in the bathroom?  Keep the floor dry. Clean up any water on the floor right away.  Remove soap buildup in the tub or shower.  Use nonskid mats or decals on the floor of the tub or shower.  Attach bath mats securely with double-sided, nonslip rug tape.  If you need to sit down in the shower, use a plastic, nonslip stool.  Install grab bars by the toilet and in the tub and shower. Do not use towel bars as grab bars.      What can I do in the bedroom?  Make sure that you have a light by your bed that is easy to reach.  Do not use any sheets or blankets for your bed that hang to the floor.  Have a firm chair with side arms that you can use for support when you get dressed. What can I do  in the kitchen?  Clean up any spills right away.  If you need to reach something above you, use a step stool with a grab bar.  Keep electrical cords out of the way.  Do not use floor polish or wax that makes floors slippery. What can I do with my stairs?  Do not leave any items on the stairs.  Make sure that you have a light switch at the top and the bottom of the stairs.  Make sure that there are handrails on both sides of the stairs. Fix handrails that are broken or loose.  Install nonslip stair treads on all your stairs.  Avoid having throw rugs at the top or bottom of the stairs.  Choose a carpet that does not hide the edge of the steps on the stairs.  Check carpeting to make sure that it is firmly attached to the stairs. Fix carpet that is loose or worn. What can I do on the outside of my home?  Use bright outdoor lighting.  Fix the edges of walkways and driveways and fix any cracks.  Remove anything that might make you trip as you walk through a door, such as a raised step or threshold.  Trim   any bushes or trees on paths to your home.  Check to see if handrails are loose or broken and that both sides of all steps have handrails.  Install guardrails along the edges of any raised decks and porches.  Clear paths of anything that can make you trip, such as tools or rocks.  Have leaves, snow, or ice cleared regularly.  Use sand or salt on paths during winter.  Clean up any spills in your garage right away. This includes grease or oil spills. What other actions can I take?  Wear shoes that: ? Have a low heel. Do not wear high heels. ? Have rubber bottoms. ? Feel good on your feet and fit well. ? Are closed at the toe. Do not wear open-toe sandals.  Use tools that help you move around if needed. These include: ? Canes. ? Walkers. ? Scooters. ? Crutches.  Review your medicines with your doctor. Some medicines can make you feel dizzy. This can increase your  chance of falling. Ask your doctor what else you can do to help prevent falls. Where to find more information  Centers for Disease Control and Prevention, STEADI: www.cdc.gov  National Institute on Aging: www.nia.nih.gov Contact a doctor if:  You are afraid of falling at home.  You feel weak, drowsy, or dizzy at home.  You fall at home. Summary  There are many simple things that you can do to make your home safe and to help prevent falls.  Ways to make your home safe include removing things that can make you trip and installing grab bars in the bathroom.  Ask for help when making these changes in your home. This information is not intended to replace advice given to you by your health care provider. Make sure you discuss any questions you have with your health care provider. Document Revised: 09/12/2019 Document Reviewed: 09/12/2019 Elsevier Patient Education  2021 Elsevier Inc.   Health Maintenance, Female Adopting a healthy lifestyle and getting preventive care are important in promoting health and wellness. Ask your health care provider about:  The right schedule for you to have regular tests and exams.  Things you can do on your own to prevent diseases and keep yourself healthy. What should I know about diet, weight, and exercise? Eat a healthy diet  Eat a diet that includes plenty of vegetables, fruits, low-fat dairy products, and lean protein.  Do not eat a lot of foods that are high in solid fats, added sugars, or sodium.   Maintain a healthy weight Body mass index (BMI) is used to identify weight problems. It estimates body fat based on height and weight. Your health care provider can help determine your BMI and help you achieve or maintain a healthy weight. Get regular exercise Get regular exercise. This is one of the most important things you can do for your health. Most adults should:  Exercise for at least 150 minutes each week. The exercise should increase  your heart rate and make you sweat (moderate-intensity exercise).  Do strengthening exercises at least twice a week. This is in addition to the moderate-intensity exercise.  Spend less time sitting. Even light physical activity can be beneficial. Watch cholesterol and blood lipids Have your blood tested for lipids and cholesterol at 73 years of age, then have this test every 5 years. Have your cholesterol levels checked more often if:  Your lipid or cholesterol levels are high.  You are older than 73 years of age.  You are at   high risk for heart disease. What should I know about cancer screening? Depending on your health history and family history, you may need to have cancer screening at various ages. This may include screening for:  Breast cancer.  Cervical cancer.  Colorectal cancer.  Skin cancer.  Lung cancer. What should I know about heart disease, diabetes, and high blood pressure? Blood pressure and heart disease  High blood pressure causes heart disease and increases the risk of stroke. This is more likely to develop in people who have high blood pressure readings, are of African descent, or are overweight.  Have your blood pressure checked: ? Every 3-5 years if you are 18-39 years of age. ? Every year if you are 40 years old or older. Diabetes Have regular diabetes screenings. This checks your fasting blood sugar level. Have the screening done:  Once every three years after age 40 if you are at a normal weight and have a low risk for diabetes.  More often and at a younger age if you are overweight or have a high risk for diabetes. What should I know about preventing infection? Hepatitis B If you have a higher risk for hepatitis B, you should be screened for this virus. Talk with your health care provider to find out if you are at risk for hepatitis B infection. Hepatitis C Testing is recommended for:  Everyone born from 1945 through 1965.  Anyone with known  risk factors for hepatitis C. Sexually transmitted infections (STIs)  Get screened for STIs, including gonorrhea and chlamydia, if: ? You are sexually active and are younger than 73 years of age. ? You are older than 73 years of age and your health care provider tells you that you are at risk for this type of infection. ? Your sexual activity has changed since you were last screened, and you are at increased risk for chlamydia or gonorrhea. Ask your health care provider if you are at risk.  Ask your health care provider about whether you are at high risk for HIV. Your health care provider may recommend a prescription medicine to help prevent HIV infection. If you choose to take medicine to prevent HIV, you should first get tested for HIV. You should then be tested every 3 months for as long as you are taking the medicine. Pregnancy  If you are about to stop having your period (premenopausal) and you may become pregnant, seek counseling before you get pregnant.  Take 400 to 800 micrograms (mcg) of folic acid every day if you become pregnant.  Ask for birth control (contraception) if you want to prevent pregnancy. Osteoporosis and menopause Osteoporosis is a disease in which the bones lose minerals and strength with aging. This can result in bone fractures. If you are 65 years old or older, or if you are at risk for osteoporosis and fractures, ask your health care provider if you should:  Be screened for bone loss.  Take a calcium or vitamin D supplement to lower your risk of fractures.  Be given hormone replacement therapy (HRT) to treat symptoms of menopause. Follow these instructions at home: Lifestyle  Do not use any products that contain nicotine or tobacco, such as cigarettes, e-cigarettes, and chewing tobacco. If you need help quitting, ask your health care provider.  Do not use street drugs.  Do not share needles.  Ask your health care provider for help if you need support or  information about quitting drugs. Alcohol use  Do not drink alcohol   if: ? Your health care provider tells you not to drink. ? You are pregnant, may be pregnant, or are planning to become pregnant.  If you drink alcohol: ? Limit how much you use to 0-1 drink a day. ? Limit intake if you are breastfeeding.  Be aware of how much alcohol is in your drink. In the [place] one drink equals one 12 oz bottle of beer (355 mL), one 5 oz glass of wine (148 mL), or one 1 oz glass of hard liquor (44 mL). General instructions  Schedule regular health, dental, and eye exams.  Stay current with your vaccines.  Tell your health care provider if: ? You often feel depressed. ? You have ever been abused or do not feel safe at home. Summary  Adopting a healthy lifestyle and getting preventive care are important in promoting health and wellness.  Follow your health care provider's instructions about healthy diet, exercising, and getting tested or screened for diseases.  Follow your health care provider's instructions on monitoring your cholesterol and blood pressure. This information is not intended to replace advice given to you by your health care provider. Make sure you discuss any questions you have with your health care provider. Document Revised: 02/01/2018 Document Reviewed: 02/01/2018 Elsevier Patient Education  2021 Elsevier Inc.  

## 2020-05-05 ENCOUNTER — Other Ambulatory Visit: Payer: Self-pay | Admitting: Physician Assistant

## 2020-05-13 ENCOUNTER — Ambulatory Visit (INDEPENDENT_AMBULATORY_CARE_PROVIDER_SITE_OTHER): Payer: Medicare PPO | Admitting: Family Medicine

## 2020-05-13 ENCOUNTER — Encounter: Payer: Self-pay | Admitting: Family Medicine

## 2020-05-13 ENCOUNTER — Other Ambulatory Visit: Payer: Self-pay

## 2020-05-13 VITALS — BP 134/61 | HR 93 | Ht 66.0 in | Wt 208.0 lb

## 2020-05-13 DIAGNOSIS — I10 Essential (primary) hypertension: Secondary | ICD-10-CM | POA: Diagnosis not present

## 2020-05-13 DIAGNOSIS — G620 Drug-induced polyneuropathy: Secondary | ICD-10-CM | POA: Diagnosis not present

## 2020-05-13 DIAGNOSIS — C7989 Secondary malignant neoplasm of other specified sites: Secondary | ICD-10-CM | POA: Diagnosis not present

## 2020-05-13 DIAGNOSIS — Z5181 Encounter for therapeutic drug level monitoring: Secondary | ICD-10-CM | POA: Diagnosis not present

## 2020-05-13 DIAGNOSIS — E876 Hypokalemia: Secondary | ICD-10-CM

## 2020-05-13 DIAGNOSIS — C541 Malignant neoplasm of endometrium: Secondary | ICD-10-CM | POA: Diagnosis not present

## 2020-05-13 DIAGNOSIS — D702 Other drug-induced agranulocytosis: Secondary | ICD-10-CM | POA: Diagnosis not present

## 2020-05-13 DIAGNOSIS — T451X5A Adverse effect of antineoplastic and immunosuppressive drugs, initial encounter: Secondary | ICD-10-CM | POA: Diagnosis not present

## 2020-05-13 DIAGNOSIS — Z7901 Long term (current) use of anticoagulants: Secondary | ICD-10-CM

## 2020-05-13 MED ORDER — POTASSIUM CHLORIDE ER 10 MEQ PO CPCR: 10.0000 meq | ORAL_CAPSULE | ORAL | 1 refills | Status: AC

## 2020-05-13 MED ORDER — DABIGATRAN ETEXILATE MESYLATE 150 MG PO CAPS
150.0000 mg | ORAL_CAPSULE | Freq: Two times a day (BID) | ORAL | 1 refills | Status: AC
Start: 2020-05-13 — End: ?

## 2020-05-13 NOTE — Assessment & Plan Note (Signed)
Well controlled. Continue current regimen. Follow up in  4 mo. Due for labs at that time.

## 2020-05-13 NOTE — Assessment & Plan Note (Addendum)
Followed at [facility]. Has repeat CT next month for staging.

## 2020-05-13 NOTE — Assessment & Plan Note (Signed)
Stable.  Feels like having hard time getting out of her chair but says she has been walking 30 min a day.

## 2020-05-13 NOTE — Progress Notes (Signed)
Established Patient Office Visit  Subjective:  Patient ID: Erika Strong, female    DOB: 1945-05-06  Age: 75 y.o. MRN: [id number]  CC:  Chief Complaint  Patient presents with  . Medication Problem    HPI Erika Strong presents for f/u  To discuss the Pradaxa. She read in the drug info that it needs to be stopped if greater than 75. She is doing well nint. Has hx of DVT and PE    She also noted on her labs her alk phos was a little low.  She says this was not necessarily unusual for her but wanted to make sure that it was not an indication of anything worrisome.  Her recent renal function was stable.  Hypertension- Pt denies chest pain, SOB, dizziness, or heart palpitations.  Taking meds as directed w/o problems.  Denies medication side effects.  He has been try to stay active walking 30 minutes a day on average.  But notes she still having some difficulty getting in and out of a chair.  F/U endometrial cancer that is metastatic. On her new CT in January showed new liver metastasis and worsening peritoneal carcinomatosis as well as bilateral pulmonary metastasis that seem to be getting a little worse.  Past Medical History:  Diagnosis Date  . Anemia due to chemotherapy 01/23/2014  . Cancer ([place])   . Cause of injury, fall 01/14/2020  . Depression 10/22/2016  . Diverticulitis 2010  . Endometrial cancer ([place]) 11/02/2010  . Hepatic steatosis 02/12/2014  . Hyperlipidemia    2007  . Hypertension   . Malignant neoplasm of corpus uteri, except isthmus ([place]) 01/21/2011  . Wears glasses     Past Surgical History:  Procedure Laterality Date  . ABDOMINAL HYSTERECTOMY  11/24/2010   RTLH, BSO, RPLND, LPLNS  . GONADECTOMY AND HYSTERECTOMY  11/24/2010   Endometrial cancer, performed by Dr. Janie Morning  . MOLE REMOVAL  Nov 2012    Family History  Problem Relation Age of Onset  . Osteoporosis Mother   . Rheum arthritis Mother   . Cancer Mother   . Lung cancer Father        smoker   . Cancer Father     Social History   Socioeconomic History  . Marital status: Married    Spouse name: [name]  . Number of children: 1   . Years of education: Assoc degr  . Highest education level: Associate degree: academic program  Occupational History  . 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  Tobacco Use  . Smoking status: Never Smoker  . Smokeless tobacco: Never Used  Vaping Use  . Vaping Use: Never used  Substance and Sexual Activity  . Alcohol use: No  . Drug use: No  . Sexual activity: Not Currently  Other Topics Concern  . Not on file  Social History Narrative   Walks daily for exercise. Still works part time doing hair twice a week. One cup of coffee every day. Enjoys doing hair on Thursday and Friday. Cooks for the family on Wednesday and Saturday.   Social Determinants of Health   Financial Resource Strain: Low Risk   . Difficulty of Paying Living Expenses: Not hard at all  Food Insecurity: No Food Insecurity  . Worried About Charity fundraiser in the Last Year: Never true  . Ran Out of Food in the Last Year: Never true  Transportation Needs: No Transportation Needs  . Lack  of Transportation (Medical): No  . Lack of Transportation (Non-Medical): No  Physical Activity: Sufficiently Active  . Days of Exercise per Week: 7 days  . Minutes of Exercise per Session: 30 min  Stress: No Stress Concern Present  . Feeling of Stress : Not at all  Social Connections: Moderately Integrated  . Frequency of Communication with Friends and Family: More than three times a week  . Frequency of Social Gatherings with Friends and Family: Once a week  . Attends Religious Services: More than 4 times per year  . Active Member of Clubs or Organizations: No  . Attends Archivist Meetings: Never  . Marital Status: Married  Human resources officer Violence: Not At Risk  . Fear of Current or Ex-Partner: No  . Emotionally  Abused: No  . Physically Abused: No  . Sexually Abused: No    Outpatient Medications Prior to Visit  Medication Sig Dispense Refill  . AMBULATORY NON FORMULARY MEDICATION Medication Name: daily vibrover powder probiotic    . Ascorbic Acid (VITAMIN C CR) 1000 MG TBCR Take 1,000 mg by mouth daily.    . B Complex-C (B-COMPLEX WITH VITAMIN C) tablet Take 1 tablet by mouth daily.    . chlorthalidone (HYGROTON) 50 MG tablet TAKE 1/2 TABLET BY MOUTH DAILY 45 tablet 0  . megestrol (MEGACE) 40 MG tablet Take 80 mg by mouth 2 (two) times daily. Take 80 mg orally twice daily x 3 weeks then alternate with tamoxifen    . OVER THE COUNTER MEDICATION Take 2 tablets by mouth in the morning and at bedtime. Curamin    . OVER THE COUNTER MEDICATION Take 3 tablets by mouth daily. [place] Bone Strength (calcium product)    . OVER THE COUNTER MEDICATION Take 1 packet by mouth 2 (two) times daily. [place] Fleeting Vitamin Pak (contains fish oil, potassium and multi vitamins)    . OVER THE COUNTER MEDICATION Take 1 tablet by mouth daily. Daily vibrance    . OVER THE COUNTER MEDICATION Take 10 mLs by mouth daily with breakfast. Liquid chlorophyl    . OVER THE COUNTER MEDICATION Take 30 mLs by mouth daily with breakfast. Flax Seed Oil    . OVER THE COUNTER MEDICATION Take 2 tablets by mouth 2 (two) times daily. Stress Gaurd    . tamoxifen (NOLVADEX) 20 MG tablet Take 1 tablet (20 mg total) by mouth 2 (two) times daily. Take 1 tablet by mouth twice daily for 3 weeks, then alternate with Megace 180 tablet 1  . Ubiquinol 100 MG CAPS Take 100 mg by mouth daily with breakfast.     . potassium chloride (MICRO-K) 10 MEQ CR capsule Take 1 capsule (10 mEq total) by mouth every other day. 45 capsule 1  . PRADAXA 150 MG CAPS capsule TAKE ONE CAPSULE BY MOUTH TWICE DAILY 180 capsule 1  . OVER THE COUNTER MEDICATION Take 1 tablet by mouth daily. Liver Care    . CRANBERRY EXTRACT PO Take by mouth. (Patient not taking: Reported  on 04/16/2020)    . OVER THE COUNTER MEDICATION Take 2 tablets by mouth 2 (two) times daily. Stress J (Patient not taking: Reported on 04/16/2020)    . RESTASIS 0.05 % ophthalmic emulsion 1 drop 2 (two) times daily. (Patient not taking: Reported on 04/16/2020)    . sulfamethoxazole-trimethoprim (BACTRIM DS) 800-160 MG tablet Take 1 tablet by mouth 2 (two) times daily. (Patient not taking: Reported on 04/16/2020) 10 tablet 0  . SUPER B COMPLEX/C PO Take  1 tablet by mouth daily. (Patient not taking: Reported on 04/16/2020)    . UNABLE TO FIND Take by mouth. (Patient not taking: Reported on 04/16/2020)     No facility-administered medications prior to visit.    Allergies  Allergen Reactions  . Granix [Filgrastim]     Shot on [employer]. Developed chills and fever  . Atorvastatin Other (See Comments)     myalgias  . Fish Allergy Nausea And Vomiting  . Lisinopril Other (See Comments)     Cough  . Pravastatin Sodium Other (See Comments)     myalgias  . Simvastatin Other (See Comments)     myalgias  . Xarelto [Rivaroxaban] Other (See Comments)    Tachycardia  . Eliquis [Apixaban] Rash  . Keflex [Cephalexin] Rash    ROS Review of Systems    Objective:    Physical Exam  BP 134/61   Pulse 93   Ht 5' 6"  (1.676 m)   Wt 208 lb (94.3 kg)   SpO2 98%   BMI 33.57 kg/m  Wt Readings from Last 3 Encounters:  05/13/20 208 lb (94.3 kg)  02/25/20 211 lb (95.7 kg)  01/14/20 231 lb 1.6 oz (104.8 kg)     There are no preventive care reminders to display for this patient.  There are no preventive care reminders to display for this patient.  Lab Results  Component Value Date   TSH 3.36 02/25/2020   Lab Results  Component Value Date   WBC 11.0 (H) 02/25/2020   HGB 14.2 02/25/2020   HCT 42.0 02/25/2020   MCV 89.9 02/25/2020   PLT 279 02/25/2020   Lab Results  Component Value Date   NA 138 02/25/2020   K 4.7 02/25/2020   CHLORIDE 106 07/15/2014   CO2 27 02/25/2020   GLUCOSE 80  02/25/2020   BUN 13 02/25/2020   CREATININE 0.92 02/25/2020   BILITOT 0.6 02/25/2020   ALKPHOS 44 06/21/2016   AST 30 02/25/2020   ALT 26 02/25/2020   PROT 7.0 02/25/2020   ALBUMIN 3.8 06/21/2016   CALCIUM 10.0 02/25/2020   ANIONGAP 11 07/15/2014   EGFR 81 (L) 07/15/2014   Lab Results  Component Value Date   CHOL 199 02/25/2020   Lab Results  Component Value Date   HDL 45 (L) 02/25/2020   Lab Results  Component Value Date   LDLCALC 128 (H) 02/25/2020   Lab Results  Component Value Date   TRIG 147 02/25/2020   Lab Results  Component Value Date   CHOLHDL 4.4 02/25/2020   Lab Results  Component Value Date   HGBA1C 5.2 06/21/2016      Assessment & Plan:   Problem List Items Addressed This Visit      Cardiovascular and Mediastinum   HYPERTENSION, BENIGN    Well controlled. Continue current regimen. Follow up in  4 mo. Due for labs at that time.        Relevant Medications   dabigatran (PRADAXA) 150 MG CAPS capsule     Nervous and Auditory   Chemotherapy-induced peripheral neuropathy (HCC)    Stable.  Feels like having hard time getting out of her chair but says she has been walking 30 min a day.         Genitourinary   Endometrial cancer ([place])    Followed at [facility]. Has repeat CT next month for staging.        Endometrial adenocarcinoma ([place])    Unfortunately has metastasized to lungs and liver.  She  is currently being followed at [facility].  And actually has a follow-up CT scan next month.  She is a little nervous about this.        Other   Metastatic cancer to pelvis ([place])   Drug-induced neutropenia (HCC)   Anticoagulation management encounter s/p bilat pul emboli - Primary    Other Visit Diagnoses    Hypokalemia       Relevant Medications   potassium chloride (MICRO-K) 10 MEQ CR capsule      Meds ordered this encounter  Medications  . dabigatran (PRADAXA) 150 MG CAPS capsule    Sig: Take 1 capsule (150 mg total) by mouth 2 (two) times daily.     Dispense:  180 capsule    Refill:  1    This prescription was filled on 04/15/2020. Any refills authorized will be placed on file.  . potassium chloride (MICRO-K) 10 MEQ CR capsule    Sig: Take 1 capsule (10 mEq total) by mouth every other day.    Dispense:  45 capsule    Refill:  1    This prescription was filled on 01/07/2020. Any refills authorized will be placed on file.    Follow-up: Return in about 4 months (around 09/12/2020) for recheck kidneys, etc .    Beatrice Lecher, MD

## 2020-05-14 ENCOUNTER — Encounter: Payer: Self-pay | Admitting: Family Medicine

## 2020-05-14 NOTE — Assessment & Plan Note (Signed)
Unfortunately has metastasized to lungs and liver.  She is currently being followed at [facility].  And actually has a follow-up CT scan next month.  She is a little nervous about this.

## 2020-06-06 DIAGNOSIS — R591 Generalized enlarged lymph nodes: Secondary | ICD-10-CM | POA: Diagnosis not present

## 2020-06-06 DIAGNOSIS — Z79899 Other long term (current) drug therapy: Secondary | ICD-10-CM | POA: Diagnosis not present

## 2020-06-06 DIAGNOSIS — C7951 Secondary malignant neoplasm of bone: Secondary | ICD-10-CM | POA: Diagnosis not present

## 2020-06-06 DIAGNOSIS — C541 Malignant neoplasm of endometrium: Secondary | ICD-10-CM | POA: Diagnosis not present

## 2020-06-06 DIAGNOSIS — C787 Secondary malignant neoplasm of liver and intrahepatic bile duct: Secondary | ICD-10-CM | POA: Diagnosis not present

## 2020-06-06 DIAGNOSIS — Z5111 Encounter for antineoplastic chemotherapy: Secondary | ICD-10-CM | POA: Diagnosis not present

## 2020-06-06 DIAGNOSIS — M898X8 Other specified disorders of bone, other site: Secondary | ICD-10-CM | POA: Diagnosis not present

## 2020-06-06 DIAGNOSIS — C7800 Secondary malignant neoplasm of unspecified lung: Secondary | ICD-10-CM | POA: Diagnosis not present

## 2020-06-06 DIAGNOSIS — C786 Secondary malignant neoplasm of retroperitoneum and peritoneum: Secondary | ICD-10-CM | POA: Diagnosis not present

## 2020-06-27 DIAGNOSIS — Z5112 Encounter for antineoplastic immunotherapy: Secondary | ICD-10-CM | POA: Diagnosis not present

## 2020-06-27 DIAGNOSIS — R9431 Abnormal electrocardiogram [ECG] [EKG]: Secondary | ICD-10-CM | POA: Diagnosis not present

## 2020-06-27 DIAGNOSIS — C541 Malignant neoplasm of endometrium: Secondary | ICD-10-CM | POA: Diagnosis not present

## 2020-06-27 DIAGNOSIS — Z79899 Other long term (current) drug therapy: Secondary | ICD-10-CM | POA: Diagnosis not present

## 2020-07-01 DIAGNOSIS — I10 Essential (primary) hypertension: Secondary | ICD-10-CM | POA: Diagnosis not present

## 2020-07-01 DIAGNOSIS — Z91013 Allergy to seafood: Secondary | ICD-10-CM | POA: Diagnosis not present

## 2020-07-01 DIAGNOSIS — S0003XA Contusion of scalp, initial encounter: Secondary | ICD-10-CM | POA: Diagnosis not present

## 2020-07-01 DIAGNOSIS — S0990XA Unspecified injury of head, initial encounter: Secondary | ICD-10-CM | POA: Diagnosis not present

## 2020-07-01 DIAGNOSIS — Z888 Allergy status to other drugs, medicaments and biological substances status: Secondary | ICD-10-CM | POA: Diagnosis not present

## 2020-07-01 DIAGNOSIS — Z79899 Other long term (current) drug therapy: Secondary | ICD-10-CM | POA: Diagnosis not present

## 2020-07-01 DIAGNOSIS — G44319 Acute post-traumatic headache, not intractable: Secondary | ICD-10-CM | POA: Diagnosis not present

## 2020-07-01 DIAGNOSIS — Z7901 Long term (current) use of anticoagulants: Secondary | ICD-10-CM | POA: Diagnosis not present

## 2020-07-11 DIAGNOSIS — C541 Malignant neoplasm of endometrium: Secondary | ICD-10-CM | POA: Diagnosis not present

## 2020-07-15 ENCOUNTER — Other Ambulatory Visit: Payer: Self-pay | Admitting: Family Medicine

## 2020-07-18 DIAGNOSIS — I499 Cardiac arrhythmia, unspecified: Secondary | ICD-10-CM | POA: Diagnosis not present

## 2020-07-18 DIAGNOSIS — C7800 Secondary malignant neoplasm of unspecified lung: Secondary | ICD-10-CM | POA: Diagnosis not present

## 2020-07-18 DIAGNOSIS — K5900 Constipation, unspecified: Secondary | ICD-10-CM | POA: Diagnosis not present

## 2020-07-18 DIAGNOSIS — C786 Secondary malignant neoplasm of retroperitoneum and peritoneum: Secondary | ICD-10-CM | POA: Diagnosis not present

## 2020-07-18 DIAGNOSIS — I517 Cardiomegaly: Secondary | ICD-10-CM | POA: Diagnosis not present

## 2020-07-18 DIAGNOSIS — Z8542 Personal history of malignant neoplasm of other parts of uterus: Secondary | ICD-10-CM | POA: Diagnosis not present

## 2020-07-18 DIAGNOSIS — Z5112 Encounter for antineoplastic immunotherapy: Secondary | ICD-10-CM | POA: Diagnosis not present

## 2020-07-18 DIAGNOSIS — C541 Malignant neoplasm of endometrium: Secondary | ICD-10-CM | POA: Diagnosis not present

## 2020-07-18 DIAGNOSIS — C787 Secondary malignant neoplasm of liver and intrahepatic bile duct: Secondary | ICD-10-CM | POA: Diagnosis not present

## 2020-07-28 DIAGNOSIS — L821 Other seborrheic keratosis: Secondary | ICD-10-CM | POA: Diagnosis not present

## 2020-07-28 DIAGNOSIS — D692 Other nonthrombocytopenic purpura: Secondary | ICD-10-CM | POA: Diagnosis not present

## 2020-07-28 DIAGNOSIS — L570 Actinic keratosis: Secondary | ICD-10-CM | POA: Diagnosis not present

## 2020-07-28 DIAGNOSIS — Z85828 Personal history of other malignant neoplasm of skin: Secondary | ICD-10-CM | POA: Diagnosis not present

## 2020-07-29 ENCOUNTER — Telehealth: Payer: Self-pay | Admitting: Family Medicine

## 2020-07-29 DIAGNOSIS — R9431 Abnormal electrocardiogram [ECG] [EKG]: Secondary | ICD-10-CM

## 2020-07-29 NOTE — Telephone Encounter (Signed)
Please call patient and let her know that I received notification from [facility] that her recent EKG had changed in comparison to previous new change with left ventricular hypertrophy and left precordial RS transition now present.  I would like to get her referred to cardiology locally for further work-up.  If she has a preference for where she would like to be seen or specific part of [facility] that she is seen in the past then please let me know otherwise we will try to get her in either downstairs or at her [facility] location.

## 2020-07-30 NOTE — Telephone Encounter (Signed)
Erika Strong agreed to go downstairs for Cardiology.

## 2020-07-31 ENCOUNTER — Telehealth: Payer: Self-pay | Admitting: Family Medicine

## 2020-07-31 DIAGNOSIS — C541 Malignant neoplasm of endometrium: Secondary | ICD-10-CM | POA: Diagnosis not present

## 2020-07-31 NOTE — Telephone Encounter (Signed)
Ok, thank you

## 2020-07-31 NOTE — Telephone Encounter (Signed)
I received the fax request from [facility] and have ordered the lab.  Pt notified.

## 2020-07-31 NOTE — Telephone Encounter (Signed)
Pt called. [facility] is following her for Amino Therapy. [facility] has asked her to get her bloodwork done at PCP office to avoid a trip to their office just for bloodwork.  They have faxed off the labs she needs. Unfortunately they need bloodwork done today. There was some mix up with [facility] as to why they didn't send lab order sooner.  Thank you.

## 2020-08-01 LAB — COMPREHENSIVE METABOLIC PANEL
AG Ratio: 1.1 (calc) (ref 1.0–2.5)
ALT: 18 U/L (ref 6–29)
AST: 34 U/L (ref 10–35)
Albumin: 3.9 g/dL (ref 3.6–5.1)
Alkaline phosphatase (APISO): 54 U/L (ref 37–153)
BUN: 9 mg/dL (ref 7–25)
CO2: 29 mmol/L (ref 20–32)
Calcium: 9.7 mg/dL (ref 8.6–10.4)
Chloride: 98 mmol/L (ref 98–110)
Creat: 0.76 mg/dL (ref 0.60–0.93)
Globulin: 3.4 g/dL (calc) (ref 1.9–3.7)
Glucose, Bld: 103 mg/dL — ABNORMAL HIGH (ref 65–99)
Potassium: 3.9 mmol/L (ref 3.5–5.3)
Sodium: 135 mmol/L (ref 135–146)
Total Bilirubin: 0.5 mg/dL (ref 0.2–1.2)
Total Protein: 7.3 g/dL (ref 6.1–8.1)

## 2020-08-01 NOTE — Progress Notes (Signed)
All labs are normal. 

## 2020-08-08 DIAGNOSIS — C7951 Secondary malignant neoplasm of bone: Secondary | ICD-10-CM | POA: Diagnosis not present

## 2020-08-08 DIAGNOSIS — C787 Secondary malignant neoplasm of liver and intrahepatic bile duct: Secondary | ICD-10-CM | POA: Diagnosis not present

## 2020-08-08 DIAGNOSIS — Z5111 Encounter for antineoplastic chemotherapy: Secondary | ICD-10-CM | POA: Diagnosis not present

## 2020-08-08 DIAGNOSIS — R42 Dizziness and giddiness: Secondary | ICD-10-CM | POA: Diagnosis not present

## 2020-08-08 DIAGNOSIS — Z5112 Encounter for antineoplastic immunotherapy: Secondary | ICD-10-CM | POA: Diagnosis not present

## 2020-08-08 DIAGNOSIS — I10 Essential (primary) hypertension: Secondary | ICD-10-CM | POA: Diagnosis not present

## 2020-08-08 DIAGNOSIS — C7802 Secondary malignant neoplasm of left lung: Secondary | ICD-10-CM | POA: Diagnosis not present

## 2020-08-08 DIAGNOSIS — C786 Secondary malignant neoplasm of retroperitoneum and peritoneum: Secondary | ICD-10-CM | POA: Diagnosis not present

## 2020-08-08 DIAGNOSIS — C7801 Secondary malignant neoplasm of right lung: Secondary | ICD-10-CM | POA: Diagnosis not present

## 2020-08-08 DIAGNOSIS — C541 Malignant neoplasm of endometrium: Secondary | ICD-10-CM | POA: Diagnosis not present

## 2020-08-27 ENCOUNTER — Ambulatory Visit (INDEPENDENT_AMBULATORY_CARE_PROVIDER_SITE_OTHER): Payer: Medicare PPO | Admitting: Family Medicine

## 2020-08-27 ENCOUNTER — Encounter: Payer: Self-pay | Admitting: Family Medicine

## 2020-08-27 ENCOUNTER — Telehealth: Payer: Self-pay | Admitting: Family Medicine

## 2020-08-27 ENCOUNTER — Other Ambulatory Visit: Payer: Self-pay

## 2020-08-27 VITALS — BP 133/77 | HR 82 | Ht 66.0 in | Wt 190.0 lb

## 2020-08-27 DIAGNOSIS — N644 Mastodynia: Secondary | ICD-10-CM

## 2020-08-27 DIAGNOSIS — R9431 Abnormal electrocardiogram [ECG] [EKG]: Secondary | ICD-10-CM

## 2020-08-27 DIAGNOSIS — I10 Essential (primary) hypertension: Secondary | ICD-10-CM

## 2020-08-27 DIAGNOSIS — C7989 Secondary malignant neoplasm of other specified sites: Secondary | ICD-10-CM

## 2020-08-27 DIAGNOSIS — Z1239 Encounter for other screening for malignant neoplasm of breast: Secondary | ICD-10-CM

## 2020-08-27 DIAGNOSIS — I517 Cardiomegaly: Secondary | ICD-10-CM | POA: Diagnosis not present

## 2020-08-27 DIAGNOSIS — C541 Malignant neoplasm of endometrium: Secondary | ICD-10-CM

## 2020-08-27 NOTE — Assessment & Plan Note (Signed)
Well controlled. Continue current regimen. Follow up in  6 mo  

## 2020-08-27 NOTE — Telephone Encounter (Signed)
Please call Dr. Blake Divine office, her oncologist.  Patient still having significant pain so is looking for alternative options that might be helpful for her.  It looks like Cymbalta interferes with her chemo.  I know they wanted minimize exposure to narcotics at this point in her treatment regimen so wondered if it would be okay if we did a trial of gabapentin to see if that is helpful I did not see any contraindications with her current chemo treatments but I wanted to make sure that it was okay with them.

## 2020-08-27 NOTE — Progress Notes (Addendum)
Established Patient Office Visit  Subjective:  Patient ID: Erika Strong, female    DOB: [birth year]  Age: 75 y.o. MRN: [id number]  CC:  Chief Complaint  Patient presents with   Follow-up    HPI [name] presents for 4 mo f/u  Hypertension- Pt denies chest pain, SOB, dizziness, or heart palpitations.  Taking meds as directed w/o problems.  Denies medication side effects.    She is also being followed at [facility] for recurrent endometrial adenocarcinoma.  She is currently receiving cycles of Keytruda.  Last 1 was June 16.  CT in January had shown worsening peritoneal carcinomatosis as well as bilateral pulmonary metastasis.  He is actually been doing really well with the [facility] and has not had any major side effects but unfortunately with the oral capsule Lenvima its been causing some right-sided breast pain she denies any rash irritation of the skin or redness or swelling.  She is due for her yearly mammogram at the end of the month.  She has been mostly relying on Tylenol she was actually taking little too much so had to back off on that.  And she has been using topical lidocaine as well as lidocaine patches and that does seem to help as well.  She is still not getting in quite enough pain control some nights it does keep her awake.  She is also had some constipation as a side effect of her chemotherapy.  She has been using Metamucil daily and then occasionally will use Senokot this seems to be working well overall.  Mack in May she was seen by her specialist at [facility] and noted to have irregular heart rhythm which she has had on and off before.  EKG was obtained they did not see any QT prolongation which they were potentially concerned about but did see some new onset LVH.  Fact we placed a referral about 4 weeks ago on June night to cardiology.  Past Medical History:  Diagnosis Date   Anemia due to chemotherapy 01/23/2014   Cancer ([place])    Cause of injury, fall 01/14/2020    Depression 10/22/2016   Diverticulitis 2010   Endometrial cancer ([place]) 11/02/2010   Hepatic steatosis 02/12/2014   Hyperlipidemia    2007   Hypertension    Malignant neoplasm of corpus uteri, except isthmus ([place]) 01/21/2011   Wears glasses     Past Surgical History:  Procedure Laterality Date   ABDOMINAL HYSTERECTOMY  11/24/2010   RTLH, BSO, RPLND, LPLNS   GONADECTOMY AND HYSTERECTOMY  11/24/2010   Endometrial cancer, performed by Dr. Janie Morning   MOLE REMOVAL  Nov 2012    Family History  Problem Relation Age of Onset   Osteoporosis Mother    Rheum arthritis Mother    Cancer Mother    Lung cancer Father        smoker   Cancer Father     Social History   Socioeconomic History   Marital status: Married    Spouse name: [name]   Number of children: 1    Years of education: Assoc degr   Highest education level: Associate degree: academic program  Occupational History   Occupation: Engineer, maintenance    Comment: retired    Fish farm manager: [employer]  Tobacco Use   Smoking status: Never   Smokeless tobacco: Never  Vaping Use   Vaping Use: Never used  Substance and Sexual Activity   Alcohol use: No   Drug use: No   Sexual  activity: Not Currently  Other Topics Concern   Not on file  Social History Narrative   Walks daily for exercise. Still works part time doing hair twice a week. One cup of coffee every day. Enjoys doing hair on Thursday and Friday. Cooks for the family on Wednesday and Saturday.   Social Determinants of Health   Financial Resource Strain: Low Risk    Difficulty of Paying Living Expenses: Not hard at all  Food Insecurity: No Food Insecurity   Worried About Charity fundraiser in the Last Year: Never true   [place] in the Last Year: Never true  Transportation Needs: No Transportation Needs   Lack of Transportation (Medical): No   Lack of Transportation (Non-Medical): No  Physical Activity: Sufficiently Active   Days of  Exercise per Week: 7 days   Minutes of Exercise per Session: 30 min  Stress: No Stress Concern Present   Feeling of Stress : Not at all  Social Connections: Moderately Integrated   Frequency of Communication with Friends and Family: More than three times a week   Frequency of Social Gatherings with Friends and Family: Once a week   Attends Religious Services: More than 4 times per year   Active Member of [employer] or Organizations: No   Attends Archivist Meetings: Never   Marital Status: Married  Human resources officer Violence: Not At Risk   Fear of Current or Ex-Partner: No   Emotionally Abused: No   Physically Abused: No   Sexually Abused: No    Outpatient Medications Prior to Visit  Medication Sig Dispense Refill   amLODipine (NORVASC) 5 MG tablet Take 5 mg by mouth daily.     Ascorbic Acid (VITAMIN C CR) 1000 MG TBCR Take 1,000 mg by mouth daily.     B Complex-C (B-COMPLEX WITH VITAMIN C) tablet Take 1 tablet by mouth daily.     chlorthalidone (HYGROTON) 50 MG tablet TAKE 1/2 TABLET BY MOUTH DAILY 45 tablet 0   dabigatran (PRADAXA) 150 MG CAPS capsule Take 1 capsule (150 mg total) by mouth 2 (two) times daily. 180 capsule 1   lenvatinib 10 mg daily dose (LENVIMA) capsule Take 1 capsule by mouth daily.     Multiple Vitamins-Minerals (VITRUM SENIOR) TABS Take 1 tablet by mouth daily.     OVER THE COUNTER MEDICATION Take 3 tablets by mouth daily. [place] Bone Strength (calcium product)     OVER THE COUNTER MEDICATION Take 1 tablet by mouth daily. Daily vibrance     OVER THE COUNTER MEDICATION Take 10 mLs by mouth daily with breakfast. Liquid chlorophyl     OVER THE COUNTER MEDICATION Take 2 tablets by mouth 2 (two) times daily. Stress Gaurd     potassium chloride (MICRO-K) 10 MEQ CR capsule Take 1 capsule (10 mEq total) by mouth every other day. 45 capsule 1   Ubiquinol 100 MG CAPS Take 100 mg by mouth daily with breakfast.      AMBULATORY NON FORMULARY MEDICATION Medication  Name: daily vibrover powder probiotic     megestrol (MEGACE) 40 MG tablet Take 80 mg by mouth 2 (two) times daily. Take 80 mg orally twice daily x 3 weeks then alternate with tamoxifen     OVER THE COUNTER MEDICATION Take 2 tablets by mouth in the morning and at bedtime. Curamin     OVER THE COUNTER MEDICATION Take 1 packet by mouth 2 (two) times daily. [place] Fleeting Vitamin Pak (contains fish oil, potassium  and multi vitamins)     OVER THE COUNTER MEDICATION Take 1 tablet by mouth daily. Liver Care     OVER THE COUNTER MEDICATION Take 30 mLs by mouth daily with breakfast. Flax Seed Oil     tamoxifen (NOLVADEX) 20 MG tablet Take 1 tablet (20 mg total) by mouth 2 (two) times daily. Take 1 tablet by mouth twice daily for 3 weeks, then alternate with Megace 180 tablet 1   No facility-administered medications prior to visit.    Allergies  Allergen Reactions   Granix [Filgrastim]     Shot on Tues. Developed chills and fever   Atorvastatin Other (See Comments)     myalgias   Fish Allergy Nausea And Vomiting   Lisinopril Other (See Comments)     Cough   Pravastatin Sodium Other (See Comments)     myalgias   Simvastatin Other (See Comments)     myalgias   Xarelto [Rivaroxaban] Other (See Comments)    Tachycardia   Eliquis [Apixaban] Rash   Keflex [Cephalexin] Rash    ROS Review of Systems    Objective:    Physical Exam Constitutional:      Appearance: Normal appearance. She is well-developed.  HENT:     Head: Normocephalic and atraumatic.  Cardiovascular:     Rate and Rhythm: Normal rate. Rhythm irregular.     Heart sounds: Normal heart sounds.  Pulmonary:     Effort: Pulmonary effort is normal.     Breath sounds: Normal breath sounds.  Skin:    General: Skin is warm and dry.  Neurological:     Mental Status: She is alert and oriented to person, place, and time.  Psychiatric:        Behavior: Behavior normal.    BP 133/77   Pulse 82   Ht 5' 6"  (1.676 m)   Wt 190 lb  (86.2 kg)   SpO2 98%   BMI 30.67 kg/m  Wt Readings from Last 3 Encounters:  08/27/20 190 lb (86.2 kg)  05/13/20 208 lb (94.3 kg)  02/25/20 211 lb (95.7 kg)     Health Maintenance Due  Topic Date Due   COVID-19 Vaccine (3 - Booster for Janssen series) 05/02/2020    There are no preventive care reminders to display for this patient.  Lab Results  Component Value Date   TSH 3.36 02/25/2020   Lab Results  Component Value Date   WBC 11.0 (H) 02/25/2020   HGB 14.2 02/25/2020   HCT 42.0 02/25/2020   MCV 89.9 02/25/2020   PLT 279 02/25/2020   Lab Results  Component Value Date   NA 135 07/31/2020   K 3.9 07/31/2020   CHLORIDE 106 07/15/2014   CO2 29 07/31/2020   GLUCOSE 103 (H) 07/31/2020   BUN 9 07/31/2020   CREATININE 0.76 07/31/2020   BILITOT 0.5 07/31/2020   ALKPHOS 44 06/21/2016   AST 34 07/31/2020   ALT 18 07/31/2020   PROT 7.3 07/31/2020   ALBUMIN 3.8 06/21/2016   CALCIUM 9.7 07/31/2020   ANIONGAP 11 07/15/2014   EGFR 81 (L) 07/15/2014   Lab Results  Component Value Date   CHOL 199 02/25/2020   Lab Results  Component Value Date   HDL 45 (L) 02/25/2020   Lab Results  Component Value Date   LDLCALC 128 (H) 02/25/2020   Lab Results  Component Value Date   TRIG 147 02/25/2020   Lab Results  Component Value Date   CHOLHDL 4.4 02/25/2020   Lab  Results  Component Value Date   HGBA1C 5.2 06/21/2016      Assessment & Plan:   Problem List Items Addressed This Visit       Cardiovascular and Mediastinum   HYPERTENSION, BENIGN - Primary    Well controlled. Continue current regimen. Follow up in  6 mo        Relevant Medications   amLODipine (NORVASC) 5 MG tablet     Genitourinary   Endometrial cancer (HCC)   Relevant Medications   lenvatinib 10 mg daily dose (LENVIMA) capsule     Other   Metastatic cancer to pelvis (HCC)   Relevant Medications   lenvatinib 10 mg daily dose (LENVIMA) capsule   Other Visit Diagnoses     Breast pain,  right       Breast cancer screening other than mammogram       Screening breast examination       Relevant Orders   MM 3D SCREEN BREAST BILATERAL   LVH (left ventricular hypertrophy)       Relevant Medications   amLODipine (NORVASC) 5 MG tablet   Other Relevant Orders   Ambulatory referral to Cardiology   Abnormal EKG       Relevant Orders   Ambulatory referral to Cardiology      Right-sided lateral breast pain-no discrete rash.  She does feel like she is wearing good support as well.  The lidocaine and Tylenol help but not quite enough.  We discussed something like Cymbalta as an option but unfortunately it can increase bleeding risk with her current medication regimen.  Tramadol might be a reasonable option.  Abnormal EKG/new onset LVH-[facility] prefer that she be seen by cardiology we had actually placed a referral in 4 weeks ago but it looks like they tried to call her multiple times and her voicemail was full.  Her husband request that they call either her cell phone or his cell phone instead.  He said they are also having problems with her landline.  She would like to go ahead and get her routine mammogram scheduled.  No orders of the defined types were placed in this encounter.   Follow-up: Return in about 2 months (around 10/28/2020) for bp .    Beatrice Lecher, MD

## 2020-08-28 NOTE — Telephone Encounter (Signed)
Erika Strong returned call and informed me that it would be ok for her to take the gabapentin with her chemo medications. Will fwd to pcp

## 2020-08-28 NOTE — Telephone Encounter (Signed)
Spoke w/Faye the triage nurse she is going to send this message to Dr. Blake Divine team for review. Someone will reach back out to our office about this.

## 2020-08-28 NOTE — Telephone Encounter (Signed)
Pt's husband informed he will let Adaliah know.

## 2020-08-29 DIAGNOSIS — Z5111 Encounter for antineoplastic chemotherapy: Secondary | ICD-10-CM | POA: Diagnosis not present

## 2020-08-29 DIAGNOSIS — N644 Mastodynia: Secondary | ICD-10-CM | POA: Diagnosis not present

## 2020-08-29 DIAGNOSIS — J984 Other disorders of lung: Secondary | ICD-10-CM | POA: Diagnosis not present

## 2020-08-29 DIAGNOSIS — C541 Malignant neoplasm of endometrium: Secondary | ICD-10-CM | POA: Diagnosis not present

## 2020-08-29 DIAGNOSIS — R591 Generalized enlarged lymph nodes: Secondary | ICD-10-CM | POA: Diagnosis not present

## 2020-08-29 DIAGNOSIS — C786 Secondary malignant neoplasm of retroperitoneum and peritoneum: Secondary | ICD-10-CM | POA: Diagnosis not present

## 2020-08-29 DIAGNOSIS — C787 Secondary malignant neoplasm of liver and intrahepatic bile duct: Secondary | ICD-10-CM | POA: Diagnosis not present

## 2020-08-29 DIAGNOSIS — J90 Pleural effusion, not elsewhere classified: Secondary | ICD-10-CM | POA: Diagnosis not present

## 2020-08-29 DIAGNOSIS — Z5112 Encounter for antineoplastic immunotherapy: Secondary | ICD-10-CM | POA: Diagnosis not present

## 2020-08-29 DIAGNOSIS — Z170 Estrogen receptor positive status [ER+]: Secondary | ICD-10-CM | POA: Diagnosis not present

## 2020-08-29 DIAGNOSIS — C7951 Secondary malignant neoplasm of bone: Secondary | ICD-10-CM | POA: Diagnosis not present

## 2020-08-29 DIAGNOSIS — C7800 Secondary malignant neoplasm of unspecified lung: Secondary | ICD-10-CM | POA: Diagnosis not present

## 2020-08-29 MED ORDER — GABAPENTIN 100 MG PO CAPS: ORAL_CAPSULE | ORAL | 0 refills | Status: DC

## 2020-08-29 NOTE — Telephone Encounter (Signed)
Meds ordered this encounter  Medications   gabapentin (NEURONTIN) 100 MG capsule    Sig: Take 1 capsule (100 mg total) by mouth at bedtime for 3 days, THEN 1 capsule (100 mg total) 2 (two) times daily for 5 days, THEN 1 capsule (100 mg total) 3 (three) times daily for 22 days.    Dispense:  79 capsule    Refill:  0

## 2020-09-02 NOTE — Telephone Encounter (Signed)
Pt called and wanted to let Dr. Madilyn Fireman know that she started the Gabapentin and is doing really well on it and it is helping with her pain.

## 2020-09-17 ENCOUNTER — Ambulatory Visit (INDEPENDENT_AMBULATORY_CARE_PROVIDER_SITE_OTHER): Payer: Medicare PPO

## 2020-09-17 ENCOUNTER — Other Ambulatory Visit: Payer: Self-pay

## 2020-09-17 DIAGNOSIS — Z1239 Encounter for other screening for malignant neoplasm of breast: Secondary | ICD-10-CM

## 2020-09-17 DIAGNOSIS — Z1231 Encounter for screening mammogram for malignant neoplasm of breast: Secondary | ICD-10-CM

## 2020-09-19 DIAGNOSIS — Z5111 Encounter for antineoplastic chemotherapy: Secondary | ICD-10-CM | POA: Diagnosis not present

## 2020-09-19 DIAGNOSIS — C7802 Secondary malignant neoplasm of left lung: Secondary | ICD-10-CM | POA: Diagnosis not present

## 2020-09-19 DIAGNOSIS — C7951 Secondary malignant neoplasm of bone: Secondary | ICD-10-CM | POA: Diagnosis not present

## 2020-09-19 DIAGNOSIS — C787 Secondary malignant neoplasm of liver and intrahepatic bile duct: Secondary | ICD-10-CM | POA: Diagnosis not present

## 2020-09-19 DIAGNOSIS — R809 Proteinuria, unspecified: Secondary | ICD-10-CM | POA: Diagnosis not present

## 2020-09-19 DIAGNOSIS — Z5112 Encounter for antineoplastic immunotherapy: Secondary | ICD-10-CM | POA: Diagnosis not present

## 2020-09-19 DIAGNOSIS — I10 Essential (primary) hypertension: Secondary | ICD-10-CM | POA: Diagnosis not present

## 2020-09-19 DIAGNOSIS — C786 Secondary malignant neoplasm of retroperitoneum and peritoneum: Secondary | ICD-10-CM | POA: Diagnosis not present

## 2020-09-19 DIAGNOSIS — C7801 Secondary malignant neoplasm of right lung: Secondary | ICD-10-CM | POA: Diagnosis not present

## 2020-09-19 DIAGNOSIS — C541 Malignant neoplasm of endometrium: Secondary | ICD-10-CM | POA: Diagnosis not present

## 2020-09-22 DIAGNOSIS — R809 Proteinuria, unspecified: Secondary | ICD-10-CM | POA: Diagnosis not present

## 2020-09-29 ENCOUNTER — Other Ambulatory Visit: Payer: Self-pay | Admitting: Family Medicine

## 2020-09-29 DIAGNOSIS — N644 Mastodynia: Secondary | ICD-10-CM

## 2020-10-01 DIAGNOSIS — J168 Pneumonia due to other specified infectious organisms: Secondary | ICD-10-CM | POA: Diagnosis not present

## 2020-10-01 DIAGNOSIS — R918 Other nonspecific abnormal finding of lung field: Secondary | ICD-10-CM | POA: Diagnosis not present

## 2020-10-01 DIAGNOSIS — J9601 Acute respiratory failure with hypoxia: Secondary | ICD-10-CM | POA: Diagnosis not present

## 2020-10-01 DIAGNOSIS — C799 Secondary malignant neoplasm of unspecified site: Secondary | ICD-10-CM | POA: Diagnosis not present

## 2020-10-01 DIAGNOSIS — J90 Pleural effusion, not elsewhere classified: Secondary | ICD-10-CM | POA: Diagnosis not present

## 2020-10-01 DIAGNOSIS — R930 Abnormal findings on diagnostic imaging of skull and head, not elsewhere classified: Secondary | ICD-10-CM | POA: Diagnosis not present

## 2020-10-01 DIAGNOSIS — C541 Malignant neoplasm of endometrium: Secondary | ICD-10-CM | POA: Diagnosis not present

## 2020-10-01 DIAGNOSIS — K7689 Other specified diseases of liver: Secondary | ICD-10-CM | POA: Diagnosis not present

## 2020-10-01 DIAGNOSIS — A419 Sepsis, unspecified organism: Secondary | ICD-10-CM | POA: Diagnosis not present

## 2020-10-01 DIAGNOSIS — I6782 Cerebral ischemia: Secondary | ICD-10-CM | POA: Diagnosis not present

## 2020-10-01 DIAGNOSIS — Z86718 Personal history of other venous thrombosis and embolism: Secondary | ICD-10-CM | POA: Diagnosis not present

## 2020-10-01 DIAGNOSIS — R410 Disorientation, unspecified: Secondary | ICD-10-CM | POA: Diagnosis not present

## 2020-10-01 DIAGNOSIS — I10 Essential (primary) hypertension: Secondary | ICD-10-CM | POA: Diagnosis not present

## 2020-10-01 DIAGNOSIS — Z86711 Personal history of pulmonary embolism: Secondary | ICD-10-CM | POA: Diagnosis not present

## 2020-10-01 DIAGNOSIS — G894 Chronic pain syndrome: Secondary | ICD-10-CM | POA: Diagnosis not present

## 2020-10-01 DIAGNOSIS — F32A Depression, unspecified: Secondary | ICD-10-CM | POA: Diagnosis not present

## 2020-10-01 DIAGNOSIS — Z20822 Contact with and (suspected) exposure to covid-19: Secondary | ICD-10-CM | POA: Diagnosis not present

## 2020-10-01 DIAGNOSIS — J189 Pneumonia, unspecified organism: Secondary | ICD-10-CM | POA: Diagnosis not present

## 2020-10-01 DIAGNOSIS — E872 Acidosis: Secondary | ICD-10-CM | POA: Diagnosis not present

## 2020-10-01 DIAGNOSIS — R4182 Altered mental status, unspecified: Secondary | ICD-10-CM | POA: Diagnosis not present

## 2020-10-02 DIAGNOSIS — J9601 Acute respiratory failure with hypoxia: Secondary | ICD-10-CM | POA: Diagnosis not present

## 2020-10-02 DIAGNOSIS — Z515 Encounter for palliative care: Secondary | ICD-10-CM | POA: Diagnosis not present

## 2020-10-02 DIAGNOSIS — J90 Pleural effusion, not elsewhere classified: Secondary | ICD-10-CM | POA: Diagnosis not present

## 2020-10-02 DIAGNOSIS — I10 Essential (primary) hypertension: Secondary | ICD-10-CM | POA: Diagnosis not present

## 2020-10-02 DIAGNOSIS — F32A Depression, unspecified: Secondary | ICD-10-CM | POA: Diagnosis not present

## 2020-10-02 DIAGNOSIS — C799 Secondary malignant neoplasm of unspecified site: Secondary | ICD-10-CM | POA: Diagnosis not present

## 2020-10-02 DIAGNOSIS — Z86718 Personal history of other venous thrombosis and embolism: Secondary | ICD-10-CM | POA: Diagnosis not present

## 2020-10-02 DIAGNOSIS — R4182 Altered mental status, unspecified: Secondary | ICD-10-CM | POA: Diagnosis not present

## 2020-10-02 DIAGNOSIS — Z86711 Personal history of pulmonary embolism: Secondary | ICD-10-CM | POA: Diagnosis not present

## 2020-10-02 DIAGNOSIS — C541 Malignant neoplasm of endometrium: Secondary | ICD-10-CM | POA: Diagnosis not present

## 2020-10-02 DIAGNOSIS — R410 Disorientation, unspecified: Secondary | ICD-10-CM | POA: Diagnosis not present

## 2020-10-02 DIAGNOSIS — Z7189 Other specified counseling: Secondary | ICD-10-CM | POA: Diagnosis not present

## 2020-10-02 DIAGNOSIS — J189 Pneumonia, unspecified organism: Secondary | ICD-10-CM | POA: Diagnosis not present

## 2020-10-03 DIAGNOSIS — C541 Malignant neoplasm of endometrium: Secondary | ICD-10-CM | POA: Diagnosis not present

## 2020-10-03 DIAGNOSIS — Z515 Encounter for palliative care: Secondary | ICD-10-CM | POA: Diagnosis not present

## 2020-10-03 DIAGNOSIS — I10 Essential (primary) hypertension: Secondary | ICD-10-CM | POA: Diagnosis not present

## 2020-10-03 DIAGNOSIS — R410 Disorientation, unspecified: Secondary | ICD-10-CM | POA: Diagnosis not present

## 2020-10-03 DIAGNOSIS — Z86711 Personal history of pulmonary embolism: Secondary | ICD-10-CM | POA: Diagnosis not present

## 2020-10-03 DIAGNOSIS — F32A Depression, unspecified: Secondary | ICD-10-CM | POA: Diagnosis not present

## 2020-10-03 DIAGNOSIS — R918 Other nonspecific abnormal finding of lung field: Secondary | ICD-10-CM | POA: Diagnosis not present

## 2020-10-03 DIAGNOSIS — J9601 Acute respiratory failure with hypoxia: Secondary | ICD-10-CM | POA: Diagnosis not present

## 2020-10-03 DIAGNOSIS — R4182 Altered mental status, unspecified: Secondary | ICD-10-CM | POA: Diagnosis not present

## 2020-10-03 DIAGNOSIS — Z86718 Personal history of other venous thrombosis and embolism: Secondary | ICD-10-CM | POA: Diagnosis not present

## 2020-10-03 DIAGNOSIS — G893 Neoplasm related pain (acute) (chronic): Secondary | ICD-10-CM | POA: Diagnosis not present

## 2020-10-03 DIAGNOSIS — J90 Pleural effusion, not elsewhere classified: Secondary | ICD-10-CM | POA: Diagnosis not present

## 2020-10-03 DIAGNOSIS — C799 Secondary malignant neoplasm of unspecified site: Secondary | ICD-10-CM | POA: Diagnosis not present

## 2020-10-04 DIAGNOSIS — C541 Malignant neoplasm of endometrium: Secondary | ICD-10-CM | POA: Diagnosis not present

## 2020-10-04 DIAGNOSIS — I10 Essential (primary) hypertension: Secondary | ICD-10-CM | POA: Diagnosis not present

## 2020-10-04 DIAGNOSIS — Z86711 Personal history of pulmonary embolism: Secondary | ICD-10-CM | POA: Diagnosis not present

## 2020-10-04 DIAGNOSIS — J9601 Acute respiratory failure with hypoxia: Secondary | ICD-10-CM | POA: Diagnosis not present

## 2020-10-04 DIAGNOSIS — J90 Pleural effusion, not elsewhere classified: Secondary | ICD-10-CM | POA: Diagnosis not present

## 2020-10-04 DIAGNOSIS — Z86718 Personal history of other venous thrombosis and embolism: Secondary | ICD-10-CM | POA: Diagnosis not present

## 2020-10-04 DIAGNOSIS — R410 Disorientation, unspecified: Secondary | ICD-10-CM | POA: Diagnosis not present

## 2020-10-04 DIAGNOSIS — F32A Depression, unspecified: Secondary | ICD-10-CM | POA: Diagnosis not present

## 2020-10-04 DIAGNOSIS — C799 Secondary malignant neoplasm of unspecified site: Secondary | ICD-10-CM | POA: Diagnosis not present

## 2020-10-06 ENCOUNTER — Telehealth: Payer: Self-pay

## 2020-10-06 NOTE — Telephone Encounter (Signed)
Phone call placed to patient's husband to introduce Palliative care and check in on patient. Spoke with patient's husband who shared that patient did seem a little better today. She was able to eat breakfast. Patient's husband shared that patient has an appointment with [facility] on Friday and asked that Palliative care check back in at the end of the week or early next week as he needs some time to follow up with [facility].

## 2020-10-08 DIAGNOSIS — G894 Chronic pain syndrome: Secondary | ICD-10-CM | POA: Diagnosis not present

## 2020-10-08 DIAGNOSIS — K5900 Constipation, unspecified: Secondary | ICD-10-CM | POA: Diagnosis not present

## 2020-10-08 DIAGNOSIS — I10 Essential (primary) hypertension: Secondary | ICD-10-CM | POA: Diagnosis not present

## 2020-10-08 DIAGNOSIS — C7931 Secondary malignant neoplasm of brain: Secondary | ICD-10-CM | POA: Diagnosis not present

## 2020-10-08 DIAGNOSIS — J9601 Acute respiratory failure with hypoxia: Secondary | ICD-10-CM | POA: Diagnosis not present

## 2020-10-08 DIAGNOSIS — F411 Generalized anxiety disorder: Secondary | ICD-10-CM | POA: Diagnosis not present

## 2020-10-08 DIAGNOSIS — J189 Pneumonia, unspecified organism: Secondary | ICD-10-CM | POA: Diagnosis not present

## 2020-10-08 DIAGNOSIS — G4700 Insomnia, unspecified: Secondary | ICD-10-CM | POA: Diagnosis not present

## 2020-10-08 DIAGNOSIS — C541 Malignant neoplasm of endometrium: Secondary | ICD-10-CM | POA: Diagnosis not present

## 2020-10-10 DIAGNOSIS — I10 Essential (primary) hypertension: Secondary | ICD-10-CM | POA: Diagnosis not present

## 2020-10-10 DIAGNOSIS — C541 Malignant neoplasm of endometrium: Secondary | ICD-10-CM | POA: Diagnosis not present

## 2020-10-10 DIAGNOSIS — Z90710 Acquired absence of both cervix and uterus: Secondary | ICD-10-CM | POA: Diagnosis not present

## 2020-10-10 DIAGNOSIS — Z79899 Other long term (current) drug therapy: Secondary | ICD-10-CM | POA: Diagnosis not present

## 2020-10-10 DIAGNOSIS — Z90722 Acquired absence of ovaries, bilateral: Secondary | ICD-10-CM | POA: Diagnosis not present

## 2020-10-10 DIAGNOSIS — K5900 Constipation, unspecified: Secondary | ICD-10-CM | POA: Diagnosis not present

## 2020-10-10 DIAGNOSIS — Z5111 Encounter for antineoplastic chemotherapy: Secondary | ICD-10-CM | POA: Diagnosis not present

## 2020-10-14 ENCOUNTER — Telehealth: Payer: Self-pay | Admitting: Hospice

## 2020-10-14 DIAGNOSIS — I10 Essential (primary) hypertension: Secondary | ICD-10-CM | POA: Diagnosis not present

## 2020-10-14 DIAGNOSIS — F411 Generalized anxiety disorder: Secondary | ICD-10-CM | POA: Diagnosis not present

## 2020-10-14 DIAGNOSIS — J9601 Acute respiratory failure with hypoxia: Secondary | ICD-10-CM | POA: Diagnosis not present

## 2020-10-14 DIAGNOSIS — K5900 Constipation, unspecified: Secondary | ICD-10-CM | POA: Diagnosis not present

## 2020-10-14 DIAGNOSIS — C7931 Secondary malignant neoplasm of brain: Secondary | ICD-10-CM | POA: Diagnosis not present

## 2020-10-14 DIAGNOSIS — G4700 Insomnia, unspecified: Secondary | ICD-10-CM | POA: Diagnosis not present

## 2020-10-14 DIAGNOSIS — J189 Pneumonia, unspecified organism: Secondary | ICD-10-CM | POA: Diagnosis not present

## 2020-10-14 DIAGNOSIS — G894 Chronic pain syndrome: Secondary | ICD-10-CM | POA: Diagnosis not present

## 2020-10-14 DIAGNOSIS — C541 Malignant neoplasm of endometrium: Secondary | ICD-10-CM | POA: Diagnosis not present

## 2020-10-14 NOTE — Telephone Encounter (Signed)
Called husband back to see if he wanted to schedule a Palliative Consult and after discussing and explaining Palliative services with him he has declined services at this time.  He has agreed to either call us back or contact patient's MD if they decided they needed Palliative services in the future.  Husband stated that patient is also receiving services from [facility] and they are coming out to do their initial visit today.  He was in agreement with [place] cancelling the referral at this time.

## 2020-10-15 DIAGNOSIS — C541 Malignant neoplasm of endometrium: Secondary | ICD-10-CM | POA: Diagnosis not present

## 2020-10-15 DIAGNOSIS — C7951 Secondary malignant neoplasm of bone: Secondary | ICD-10-CM | POA: Diagnosis not present

## 2020-10-16 DIAGNOSIS — G894 Chronic pain syndrome: Secondary | ICD-10-CM | POA: Diagnosis not present

## 2020-10-16 DIAGNOSIS — J189 Pneumonia, unspecified organism: Secondary | ICD-10-CM | POA: Diagnosis not present

## 2020-10-16 DIAGNOSIS — J9601 Acute respiratory failure with hypoxia: Secondary | ICD-10-CM | POA: Diagnosis not present

## 2020-10-16 DIAGNOSIS — C7931 Secondary malignant neoplasm of brain: Secondary | ICD-10-CM | POA: Diagnosis not present

## 2020-10-16 DIAGNOSIS — C541 Malignant neoplasm of endometrium: Secondary | ICD-10-CM | POA: Diagnosis not present

## 2020-10-16 DIAGNOSIS — F411 Generalized anxiety disorder: Secondary | ICD-10-CM | POA: Diagnosis not present

## 2020-10-16 DIAGNOSIS — I10 Essential (primary) hypertension: Secondary | ICD-10-CM | POA: Diagnosis not present

## 2020-10-16 DIAGNOSIS — K5900 Constipation, unspecified: Secondary | ICD-10-CM | POA: Diagnosis not present

## 2020-10-16 DIAGNOSIS — G4700 Insomnia, unspecified: Secondary | ICD-10-CM | POA: Diagnosis not present

## 2020-10-17 DIAGNOSIS — C541 Malignant neoplasm of endometrium: Secondary | ICD-10-CM | POA: Diagnosis not present

## 2020-10-17 DIAGNOSIS — Z79899 Other long term (current) drug therapy: Secondary | ICD-10-CM | POA: Diagnosis not present

## 2020-10-17 DIAGNOSIS — E785 Hyperlipidemia, unspecified: Secondary | ICD-10-CM | POA: Diagnosis not present

## 2020-10-17 DIAGNOSIS — C3491 Malignant neoplasm of unspecified part of right bronchus or lung: Secondary | ICD-10-CM | POA: Diagnosis not present

## 2020-10-17 DIAGNOSIS — Z86711 Personal history of pulmonary embolism: Secondary | ICD-10-CM | POA: Diagnosis not present

## 2020-10-17 DIAGNOSIS — Z4682 Encounter for fitting and adjustment of non-vascular catheter: Secondary | ICD-10-CM | POA: Diagnosis not present

## 2020-10-17 DIAGNOSIS — Z86718 Personal history of other venous thrombosis and embolism: Secondary | ICD-10-CM | POA: Diagnosis not present

## 2020-10-17 DIAGNOSIS — E669 Obesity, unspecified: Secondary | ICD-10-CM | POA: Diagnosis not present

## 2020-10-17 DIAGNOSIS — I10 Essential (primary) hypertension: Secondary | ICD-10-CM | POA: Diagnosis not present

## 2020-10-17 DIAGNOSIS — J910 Malignant pleural effusion: Secondary | ICD-10-CM | POA: Diagnosis not present

## 2020-10-17 DIAGNOSIS — K760 Fatty (change of) liver, not elsewhere classified: Secondary | ICD-10-CM | POA: Diagnosis not present

## 2020-10-20 DIAGNOSIS — C569 Malignant neoplasm of unspecified ovary: Secondary | ICD-10-CM | POA: Diagnosis not present

## 2020-10-20 DIAGNOSIS — J910 Malignant pleural effusion: Secondary | ICD-10-CM | POA: Diagnosis not present

## 2020-10-20 DIAGNOSIS — C541 Malignant neoplasm of endometrium: Secondary | ICD-10-CM | POA: Diagnosis not present

## 2020-10-21 DIAGNOSIS — C541 Malignant neoplasm of endometrium: Secondary | ICD-10-CM | POA: Diagnosis not present

## 2020-10-22 DIAGNOSIS — K5900 Constipation, unspecified: Secondary | ICD-10-CM | POA: Diagnosis not present

## 2020-10-22 DIAGNOSIS — I10 Essential (primary) hypertension: Secondary | ICD-10-CM | POA: Diagnosis not present

## 2020-10-22 DIAGNOSIS — C541 Malignant neoplasm of endometrium: Secondary | ICD-10-CM | POA: Diagnosis not present

## 2020-10-22 DIAGNOSIS — J9601 Acute respiratory failure with hypoxia: Secondary | ICD-10-CM | POA: Diagnosis not present

## 2020-10-22 DIAGNOSIS — C7931 Secondary malignant neoplasm of brain: Secondary | ICD-10-CM | POA: Diagnosis not present

## 2020-10-22 DIAGNOSIS — G4700 Insomnia, unspecified: Secondary | ICD-10-CM | POA: Diagnosis not present

## 2020-10-22 DIAGNOSIS — J189 Pneumonia, unspecified organism: Secondary | ICD-10-CM | POA: Diagnosis not present

## 2020-10-22 DIAGNOSIS — G894 Chronic pain syndrome: Secondary | ICD-10-CM | POA: Diagnosis not present

## 2020-10-22 DIAGNOSIS — F411 Generalized anxiety disorder: Secondary | ICD-10-CM | POA: Diagnosis not present

## 2020-10-23 DIAGNOSIS — C7951 Secondary malignant neoplasm of bone: Secondary | ICD-10-CM | POA: Diagnosis not present

## 2020-10-24 DIAGNOSIS — C7951 Secondary malignant neoplasm of bone: Secondary | ICD-10-CM | POA: Diagnosis not present

## 2020-10-28 ENCOUNTER — Ambulatory Visit: Payer: Medicare PPO | Admitting: Family Medicine

## 2020-10-28 DIAGNOSIS — C7802 Secondary malignant neoplasm of left lung: Secondary | ICD-10-CM | POA: Diagnosis not present

## 2020-10-28 DIAGNOSIS — E871 Hypo-osmolality and hyponatremia: Secondary | ICD-10-CM | POA: Diagnosis not present

## 2020-10-28 DIAGNOSIS — R0602 Shortness of breath: Secondary | ICD-10-CM | POA: Diagnosis not present

## 2020-10-28 DIAGNOSIS — R531 Weakness: Secondary | ICD-10-CM | POA: Diagnosis not present

## 2020-10-28 DIAGNOSIS — C761 Malignant neoplasm of thorax: Secondary | ICD-10-CM | POA: Diagnosis not present

## 2020-10-28 DIAGNOSIS — J9601 Acute respiratory failure with hypoxia: Secondary | ICD-10-CM | POA: Diagnosis not present

## 2020-10-28 DIAGNOSIS — Z4682 Encounter for fitting and adjustment of non-vascular catheter: Secondary | ICD-10-CM | POA: Diagnosis not present

## 2020-10-28 DIAGNOSIS — R410 Disorientation, unspecified: Secondary | ICD-10-CM | POA: Diagnosis not present

## 2020-10-28 DIAGNOSIS — J90 Pleural effusion, not elsewhere classified: Secondary | ICD-10-CM | POA: Diagnosis not present

## 2020-10-28 DIAGNOSIS — F419 Anxiety disorder, unspecified: Secondary | ICD-10-CM | POA: Diagnosis not present

## 2020-10-28 DIAGNOSIS — C7951 Secondary malignant neoplasm of bone: Secondary | ICD-10-CM | POA: Diagnosis not present

## 2020-10-28 DIAGNOSIS — J189 Pneumonia, unspecified organism: Secondary | ICD-10-CM | POA: Diagnosis not present

## 2020-10-28 DIAGNOSIS — I10 Essential (primary) hypertension: Secondary | ICD-10-CM | POA: Diagnosis not present

## 2020-10-28 DIAGNOSIS — Z7189 Other specified counseling: Secondary | ICD-10-CM | POA: Diagnosis not present

## 2020-10-28 DIAGNOSIS — C7982 Secondary malignant neoplasm of genital organs: Secondary | ICD-10-CM | POA: Diagnosis not present

## 2020-10-28 DIAGNOSIS — G893 Neoplasm related pain (acute) (chronic): Secondary | ICD-10-CM | POA: Diagnosis not present

## 2020-10-28 DIAGNOSIS — C7931 Secondary malignant neoplasm of brain: Secondary | ICD-10-CM | POA: Diagnosis not present

## 2020-10-28 DIAGNOSIS — R918 Other nonspecific abnormal finding of lung field: Secondary | ICD-10-CM | POA: Diagnosis not present

## 2020-10-28 DIAGNOSIS — C7801 Secondary malignant neoplasm of right lung: Secondary | ICD-10-CM | POA: Diagnosis not present

## 2020-10-28 DIAGNOSIS — F32A Depression, unspecified: Secondary | ICD-10-CM | POA: Diagnosis not present

## 2020-10-28 DIAGNOSIS — Z515 Encounter for palliative care: Secondary | ICD-10-CM | POA: Diagnosis not present

## 2020-10-28 DIAGNOSIS — R600 Localized edema: Secondary | ICD-10-CM | POA: Diagnosis not present

## 2020-10-28 DIAGNOSIS — I2699 Other pulmonary embolism without acute cor pulmonale: Secondary | ICD-10-CM | POA: Diagnosis not present

## 2020-10-28 DIAGNOSIS — C781 Secondary malignant neoplasm of mediastinum: Secondary | ICD-10-CM | POA: Diagnosis not present

## 2020-10-28 DIAGNOSIS — C541 Malignant neoplasm of endometrium: Secondary | ICD-10-CM | POA: Diagnosis not present

## 2020-10-28 DIAGNOSIS — C3490 Malignant neoplasm of unspecified part of unspecified bronchus or lung: Secondary | ICD-10-CM | POA: Diagnosis not present

## 2020-10-28 DIAGNOSIS — C772 Secondary and unspecified malignant neoplasm of intra-abdominal lymph nodes: Secondary | ICD-10-CM | POA: Diagnosis not present

## 2020-11-22 DEATH — deceased

## 2021-03-31 ENCOUNTER — Telehealth: Payer: Self-pay | Admitting: Family Medicine

## 2021-04-01 NOTE — Telephone Encounter (Signed)
Chart marked.
